# Patient Record
Sex: Female | Born: 1995
Health system: Southern US, Community
[De-identification: ages and names within clinical notes are randomized; demographics above are authoritative.]

## PROBLEM LIST (undated history)

## (undated) DIAGNOSIS — F419 Anxiety disorder, unspecified: Secondary | ICD-10-CM

## (undated) DIAGNOSIS — R87629 Unspecified abnormal cytological findings in specimens from vagina: Secondary | ICD-10-CM

## (undated) DIAGNOSIS — R519 Headache, unspecified: Secondary | ICD-10-CM

## (undated) DIAGNOSIS — R51 Headache: Secondary | ICD-10-CM

## (undated) DIAGNOSIS — E039 Hypothyroidism, unspecified: Secondary | ICD-10-CM

## (undated) DIAGNOSIS — N189 Chronic kidney disease, unspecified: Secondary | ICD-10-CM

## (undated) DIAGNOSIS — F32A Depression, unspecified: Secondary | ICD-10-CM

## (undated) DIAGNOSIS — R0600 Dyspnea, unspecified: Secondary | ICD-10-CM

## (undated) DIAGNOSIS — D649 Anemia, unspecified: Secondary | ICD-10-CM

## (undated) DIAGNOSIS — M549 Dorsalgia, unspecified: Secondary | ICD-10-CM

## (undated) DIAGNOSIS — R112 Nausea with vomiting, unspecified: Secondary | ICD-10-CM

## (undated) DIAGNOSIS — G43909 Migraine, unspecified, not intractable, without status migrainosus: Secondary | ICD-10-CM

## (undated) HISTORY — DX: Unspecified abnormal cytological findings in specimens from vagina: R87.629

## (undated) HISTORY — DX: Migraine, unspecified, not intractable, without status migrainosus: G43.909

## (undated) HISTORY — DX: Chronic kidney disease, unspecified: N18.9

## (undated) HISTORY — DX: Anemia, unspecified: D64.9

## (undated) HISTORY — PX: NO PAST SURGERIES: SHX2092

## (undated) HISTORY — PX: MOUTH SURGERY: SHX715

## (undated) HISTORY — DX: Dyspnea, unspecified: R06.00

## (undated) HISTORY — DX: Hypothyroidism, unspecified: E03.9

---
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
## 2014-11-16 ENCOUNTER — Emergency Department (HOSPITAL_BASED_OUTPATIENT_CLINIC_OR_DEPARTMENT_OTHER)
Admission: EM | Admit: 2014-11-16 | Discharge: 2014-11-16 | Disposition: A | Discharge status: Home / Self Care | Payer: Medicaid Other | Attending: Emergency Medicine | Admitting: Emergency Medicine

## 2014-11-16 ENCOUNTER — Encounter (HOSPITAL_BASED_OUTPATIENT_CLINIC_OR_DEPARTMENT_OTHER): Payer: Self-pay

## 2014-11-16 DIAGNOSIS — M25561 Pain in right knee: Secondary | ICD-10-CM | POA: Insufficient documentation

## 2014-11-16 DIAGNOSIS — J45909 Unspecified asthma, uncomplicated: Secondary | ICD-10-CM | POA: Diagnosis not present

## 2014-11-16 DIAGNOSIS — Z79899 Other long term (current) drug therapy: Secondary | ICD-10-CM | POA: Insufficient documentation

## 2014-11-16 DIAGNOSIS — M25562 Pain in left knee: Secondary | ICD-10-CM | POA: Diagnosis present

## 2014-11-16 MED ORDER — MELOXICAM 15 MG PO TABS: 15.0000 mg | ORAL_TABLET | Freq: Every day | ORAL | Status: DC

## 2014-11-16 NOTE — ED Notes (Signed)
Patient assessed by PA

## 2014-11-16 NOTE — ED Notes (Signed)
C/o bilat knee pain x 2 months after starting basketball 3 months ago

## 2014-11-16 NOTE — ED Provider Notes (Signed)
CSN: [id number]     Arrival date & time 11/16/14  1225 History   First MD Initiated Contact with Patient 11/16/14 1321     Chief Complaint  Patient presents with  . Knee Pain     (Consider location/radiation/quality/duration/timing/severity/associated sxs/prior Treatment) HPI Vicki Harper is a 19 y.o. female with history of asthma, presents to emergency department complaining of bilateral knee pain, left worse than right. Patient states that she finished playing basketball for her school 2 months ago, and states that is when her pain started. States it started in the left knee. Pain over bilateral joint. States knee feels stiff. Pain with movement and walking. States that pain moves from left leg to the right at times. States at this time pain is worse in the left leg. Denies weakness or numbness in her extremities. Denies any injuries. Denies any leg swelling or calf pain. Denies any fever or chills. Denies obvious swelling in the joint.  Past Medical History  Diagnosis Date  . Asthma   . Back pain    History reviewed. No pertinent past surgical history. No family history on file. History  Substance Use Topics  . Smoking status: Never Smoker   . Smokeless tobacco: Not on file  . Alcohol Use: No   OB History    No data available     Review of Systems  Constitutional: Negative for fever and chills.  Musculoskeletal: Positive for joint swelling and arthralgias.  Skin: Negative for color change and rash.  Neurological: Negative for weakness and numbness.      Allergies  Review of patient's allergies indicates no known allergies.  Home Medications   Prior to Admission medications   Medication Sig Start Date End Date Taking? Authorizing Provider  albuterol (PROVENTIL) (2.5 MG/3ML) 0.083% nebulizer solution Take 2.5 mg by nebulization every 6 (six) hours as needed for wheezing or shortness of breath.    Historical Provider, MD   BP 118/66 mmHg  Pulse 99  Temp(Src) 98.4 F  (36.9 C) (Oral)  Resp 18  Ht 5\' 6"  (1.676 m)  Wt 140 lb (63.504 kg)  BMI 22.61 kg/m2  SpO2 99% Physical Exam  Constitutional: She is oriented to person, place, and time. She appears well-developed and well-nourished. No distress.  Eyes: Conjunctivae are normal.  Neck: Neck supple.  Musculoskeletal:  Normal-appearing bilateral knees with no swelling, color change, not warm to touch. Right knee is nontender, full range of motion, negative anterior-posterior drawer signs. Left knee with tenderness to the medial lateral joint. Limited range of motion due to pain. Negative anterior-posterior drawer signs. No laxity of medial lateral stress. Dorsal pedal pulses intact and equal bilaterally. Normal ankle and hip joints.  Neurological: She is alert and oriented to person, place, and time.  Skin: Skin is warm and dry.  Nursing note and vitals reviewed.   ED Course  Procedures (including critical care time) Labs Review Labs Reviewed - No data to display  Imaging Review No results found.   EKG Interpretation None      MDM   Final diagnoses:  Arthralgia of both knees    Patient with bilateral knee pain and stiffness after finishing basketball season. She denies any injuries. Today her left knee has limited range of motion due to pain and stiffness. Otherwise unremarkable exam. There is no evidence of infectious joint, no obvious swelling, it is not warm to touch, she is afebrile. Will start on NSAIDs. Follow-up with orthopedics. Considered rheumatological cause, pt explained she will need  to follow up for further evaluation if pain continues. Pt voiced understanding. Knee sleeve given for the left knee pain and stiffness.   Filed Vitals:   11/16/14 1230  BP: 118/66  Pulse: 99  Temp: 98.4 F (36.9 C)  TempSrc: Oral  Resp: 18  Height: 5\' 6"  (1.676 m)  Weight: 140 lb (63.504 kg)  SpO2: 99%       Jeannett Senior, PA-C 11/16/14 2127  Malvin Johns, MD 11/18/14 [phone]

## 2014-11-16 NOTE — Discharge Instructions (Signed)
Take mobic for pain and inflammation daily. You can take tylenol in addition for pain. Keep knees elevated when at home. Ice several times a day. Start knee exercises. Follow up as referred for further evaluation.   Knee Pain The knee is the complex joint between your thigh and your lower leg. It is made up of bones, tendons, ligaments, and cartilage. The bones that make up the knee are:  The femur in the thigh.  The tibia and fibula in the lower leg.  The patella or kneecap riding in the groove on the lower femur. CAUSES  Knee pain is a common complaint with many causes. A few of these causes are:  Injury, such as:  A ruptured ligament or tendon injury.  Torn cartilage.  Medical conditions, such as:  Gout  Arthritis  Infections  Overuse, over training, or overdoing a physical activity. Knee pain can be minor or severe. Knee pain can accompany debilitating injury. Minor knee problems often respond well to self-care measures or get well on their own. More serious injuries may need medical intervention or even surgery. SYMPTOMS The knee is complex. Symptoms of knee problems can vary widely. Some of the problems are:  Pain with movement and weight bearing.  Swelling and tenderness.  Buckling of the knee.  Inability to straighten or extend your knee.  Your knee locks and you cannot straighten it.  Warmth and redness with pain and fever.  Deformity or dislocation of the kneecap. DIAGNOSIS  Determining what is wrong may be very straight forward such as when there is an injury. It can also be challenging because of the complexity of the knee. Tests to make a diagnosis may include:  Your caregiver taking a history and doing a physical exam.  Routine X-rays can be used to rule out other problems. X-rays will not reveal a cartilage tear. Some injuries of the knee can be diagnosed by:  Arthroscopy a surgical technique by which a small video camera is inserted through tiny  incisions on the sides of the knee. This procedure is used to examine and repair internal knee joint problems. Tiny instruments can be used during arthroscopy to repair the torn knee cartilage (meniscus).  Arthrography is a radiology technique. A contrast liquid is directly injected into the knee joint. Internal structures of the knee joint then become visible on X-ray film.  An MRI scan is a non X-ray radiology procedure in which magnetic fields and a computer produce two- or three-dimensional images of the inside of the knee. Cartilage tears are often visible using an MRI scanner. MRI scans have largely replaced arthrography in diagnosing cartilage tears of the knee.  Blood work.  Examination of the fluid that helps to lubricate the knee joint (synovial fluid). This is done by taking a sample out using a needle and a syringe. TREATMENT The treatment of knee problems depends on the cause. Some of these treatments are:  Depending on the injury, proper casting, splinting, surgery, or physical therapy care will be needed.  Give yourself adequate recovery time. Do not overuse your joints. If you begin to get sore during workout routines, back off. Slow down or do fewer repetitions.  For repetitive activities such as cycling or running, maintain your strength and nutrition.  Alternate muscle groups. For example, if you are a weight lifter, work the upper body on one day and the lower body the next.  Either tight or weak muscles do not give the proper support for your knee. Tight  or weak muscles do not absorb the stress placed on the knee joint. Keep the muscles surrounding the knee strong.  Take care of mechanical problems.  If you have flat feet, orthotics or special shoes may help. See your caregiver if you need help.  Arch supports, sometimes with wedges on the inner or outer aspect of the heel, can help. These can shift pressure away from the side of the knee most bothered by  osteoarthritis.  A brace called an "unloader" brace also may be used to help ease the pressure on the most arthritic side of the knee.  If your caregiver has prescribed crutches, braces, wraps or ice, use as directed. The acronym for this is PRICE. This means protection, rest, ice, compression, and elevation.  Nonsteroidal anti-inflammatory drugs (NSAIDs), can help relieve pain. But if taken immediately after an injury, they may actually increase swelling. Take NSAIDs with food in your stomach. Stop them if you develop stomach problems. Do not take these if you have a history of ulcers, stomach pain, or bleeding from the bowel. Do not take without your caregiver's approval if you have problems with fluid retention, heart failure, or kidney problems.  For ongoing knee problems, physical therapy may be helpful.  Glucosamine and chondroitin are over-the-counter dietary supplements. Both may help relieve the pain of osteoarthritis in the knee. These medicines are different from the usual anti-inflammatory drugs. Glucosamine may decrease the rate of cartilage destruction.  Injections of a corticosteroid drug into your knee joint may help reduce the symptoms of an arthritis flare-up. They may provide pain relief that lasts a few months. You may have to wait a few months between injections. The injections do have a small increased risk of infection, water retention, and elevated blood sugar levels.  Hyaluronic acid injected into damaged joints may ease pain and provide lubrication. These injections may work by reducing inflammation. A series of shots may give relief for as long as 6 months.  Topical painkillers. Applying certain ointments to your skin may help relieve the pain and stiffness of osteoarthritis. Ask your pharmacist for suggestions. Many over the-counter products are approved for temporary relief of arthritis pain.  In some countries, doctors often prescribe topical NSAIDs for relief of  chronic conditions such as arthritis and tendinitis. A review of treatment with NSAID creams found that they worked as well as oral medications but without the serious side effects. PREVENTION  Maintain a healthy weight. Extra pounds put more strain on your joints.  Get strong, stay limber. Weak muscles are a common cause of knee injuries. Stretching is important. Include flexibility exercises in your workouts.  Be smart about exercise. If you have osteoarthritis, chronic knee pain or recurring injuries, you may need to change the way you exercise. This does not mean you have to stop being active. If your knees ache after jogging or playing basketball, consider switching to swimming, water aerobics, or other low-impact activities, at least for a few days a week. Sometimes limiting high-impact activities will provide relief.  Make sure your shoes fit well. Choose footwear that is right for your sport.  Protect your knees. Use the proper gear for knee-sensitive activities. Use kneepads when playing volleyball or laying carpet. Buckle your seat belt every time you drive. Most shattered kneecaps occur in car accidents.  Rest when you are tired. SEEK MEDICAL CARE IF:  You have knee pain that is continual and does not seem to be getting better.  SEEK IMMEDIATE MEDICAL CARE IF:  Your knee joint feels hot to the touch and you have a high fever. MAKE SURE YOU:   Understand these instructions.  Will watch your condition.  Will get help right away if you are not doing well or get worse. Document Released: 03/22/2007 Document Revised: 08/17/2011 Document Reviewed: 03/22/2007 [facility] Patient Information 2015 [place], [place]. This information is not intended to replace advice given to you by your health care provider. Make sure you discuss any questions you have with your health care provider.   Knee Exercises EXERCISES RANGE OF MOTION (ROM) AND STRETCHING EXERCISES These exercises may help you  when beginning to rehabilitate your injury. Your symptoms may resolve with or without further involvement from your physician, physical therapist, or athletic trainer. While completing these exercises, remember:   Restoring tissue flexibility helps normal motion to return to the joints. This allows healthier, less painful movement and activity.  An effective stretch should be held for at least 30 seconds.  A stretch should never be painful. You should only feel a gentle lengthening or release in the stretched tissue. STRETCH - Knee Extension, Prone  Lie on your stomach on a firm surface, such as a bed or countertop. Place your right / left knee and leg just beyond the edge of the surface. You may wish to place a towel under the far end of your right / left thigh for comfort.  Relax your leg muscles and allow gravity to straighten your knee. Your clinician may advise you to add an ankle weight if more resistance is helpful for you.  You should feel a stretch in the back of your right / left knee. Hold this position for __________ seconds. Repeat __________ times. Complete this stretch __________ times per day. * Your physician, physical therapist, or athletic trainer may ask you to add ankle weight to enhance your stretch.  RANGE OF MOTION - Knee Flexion, Active  Lie on your back with both knees straight. (If this causes back discomfort, bend your opposite knee, placing your foot flat on the floor.)  Slowly slide your heel back toward your buttocks until you feel a gentle stretch in the front of your knee or thigh.  Hold for __________ seconds. Slowly slide your heel back to the starting position. Repeat __________ times. Complete this exercise __________ times per day.  STRETCH - Quadriceps, Prone   Lie on your stomach on a firm surface, such as a bed or padded floor.  Bend your right / left knee and grasp your ankle. If you are unable to reach your ankle or pant leg, use a belt around  your foot to lengthen your reach.  Gently pull your heel toward your buttocks. Your knee should not slide out to the side. You should feel a stretch in the front of your thigh and/or knee.  Hold this position for __________ seconds. Repeat __________ times. Complete this stretch __________ times per day.  STRETCH - Hamstrings, Supine   Lie on your back. Loop a belt or towel over the ball of your right / left foot.  Straighten your right / left knee and slowly pull on the belt to raise your leg. Do not allow the right / left knee to bend. Keep your opposite leg flat on the floor.  Raise the leg until you feel a gentle stretch behind your right / left knee or thigh. Hold this position for __________ seconds. Repeat __________ times. Complete this stretch __________ times per day.  STRENGTHENING EXERCISES These exercises may  help you when beginning to rehabilitate your injury. They may resolve your symptoms with or without further involvement from your physician, physical therapist, or athletic trainer. While completing these exercises, remember:   Muscles can gain both the endurance and the strength needed for everyday activities through controlled exercises.  Complete these exercises as instructed by your physician, physical therapist, or athletic trainer. Progress the resistance and repetitions only as guided.  You may experience muscle soreness or fatigue, but the pain or discomfort you are trying to eliminate should never worsen during these exercises. If this pain does worsen, stop and make certain you are following the directions exactly. If the pain is still present after adjustments, discontinue the exercise until you can discuss the trouble with your clinician. STRENGTH - Quadriceps, Isometrics  Lie on your back with your right / left leg extended and your opposite knee bent.  Gradually tense the muscles in the front of your right / left thigh. You should see either your knee cap  slide up toward your hip or increased dimpling just above the knee. This motion will push the back of the knee down toward the floor/mat/bed on which you are lying.  Hold the muscle as tight as you can without increasing your pain for __________ seconds.  Relax the muscles slowly and completely in between each repetition. Repeat __________ times. Complete this exercise __________ times per day.  STRENGTH - Quadriceps, Short Arcs   Lie on your back. Place a __________ inch towel roll under your knee so that the knee slightly bends.  Raise only your lower leg by tightening the muscles in the front of your thigh. Do not allow your thigh to rise.  Hold this position for __________ seconds. Repeat __________ times. Complete this exercise __________ times per day.  OPTIONAL ANKLE WEIGHTS: Begin with ____________________, but DO NOT exceed ____________________. Increase in 1 pound/0.5 kilogram increments.  STRENGTH - Quadriceps, Straight Leg Raises  Quality counts! Watch for signs that the quadriceps muscle is working to insure you are strengthening the correct muscles and not "cheating" by substituting with healthier muscles.  Lay on your back with your right / left leg extended and your opposite knee bent.  Tense the muscles in the front of your right / left thigh. You should see either your knee cap slide up or increased dimpling just above the knee. Your thigh may even quiver.  Tighten these muscles even more and raise your leg 4 to 6 inches off the floor. Hold for __________ seconds.  Keeping these muscles tense, lower your leg.  Relax the muscles slowly and completely in between each repetition. Repeat __________ times. Complete this exercise __________ times per day.  STRENGTH - Hamstring, Curls  Lay on your stomach with your legs extended. (If you lay on a bed, your feet may hang over the edge.)  Tighten the muscles in the back of your thigh to bend your right / left knee up to 90  degrees. Keep your hips flat on the bed/floor.  Hold this position for __________ seconds.  Slowly lower your leg back to the starting position. Repeat __________ times. Complete this exercise __________ times per day.  OPTIONAL ANKLE WEIGHTS: Begin with ____________________, but DO NOT exceed ____________________. Increase in 1 pound/0.5 kilogram increments.  STRENGTH - Quadriceps, Squats  Stand in a door frame so that your feet and knees are in line with the frame.  Use your hands for balance, not support, on the frame.  Slowly lower your  weight, bending at the hips and knees. Keep your lower legs upright so that they are parallel with the door frame. Squat only within the range that does not increase your knee pain. Never let your hips drop below your knees.  Slowly return upright, pushing with your legs, not pulling with your hands. Repeat __________ times. Complete this exercise __________ times per day.  STRENGTH - Quadriceps, Wall Slides  Follow guidelines for form closely. Increased knee pain often results from poorly placed feet or knees.  Lean against a smooth wall or door and walk your feet out 18-24 inches. Place your feet hip-width apart.  Slowly slide down the wall or door until your knees bend __________ degrees.* Keep your knees over your heels, not your toes, and in line with your hips, not falling to either side.  Hold for __________ seconds. Stand up to rest for __________ seconds in between each repetition. Repeat __________ times. Complete this exercise __________ times per day. * Your physician, physical therapist, or athletic trainer will alter this angle based on your symptoms and progress. Document Released: 04/08/2005 Document Revised: 10/09/2013 Document Reviewed: 09/06/2008 [facility] Patient Information 2015 [place], [place]. This information is not intended to replace advice given to you by your health care provider. Make sure you discuss any questions you have  with your health care provider.

## 2014-11-22 ENCOUNTER — Encounter: Payer: Self-pay | Admitting: Family Medicine

## 2014-11-22 ENCOUNTER — Ambulatory Visit (INDEPENDENT_AMBULATORY_CARE_PROVIDER_SITE_OTHER): Payer: Medicaid Other | Admitting: Family Medicine

## 2014-11-22 VITALS — BP 110/71 | HR 69 | Ht 66.0 in | Wt 140.0 lb

## 2014-11-22 DIAGNOSIS — M25562 Pain in left knee: Secondary | ICD-10-CM | POA: Diagnosis not present

## 2014-11-22 DIAGNOSIS — M25561 Pain in right knee: Secondary | ICD-10-CM

## 2014-11-22 NOTE — Patient Instructions (Addendum)
You have patellofemoral syndrome. Avoid painful activities (especially squats and lunges, plyometrics, increasing running mileage) when possible. Straight leg raise, straight leg rise with foot turned outwards, side hip raises, hamstring curls, hamstring swings, and decline half-squats - all 3 sets of 10 once a day. Can add ankle weight if needed to the above if they're too easy. Consider formal physical therapy - call me if you want to do this. Better arch support with something like dr. Zoe Lan active series when you're up and walking around. Avoid barefoot walking, flat shoes when possible. Icing 15 minutes at a time 3-4 times a day as needed Tylenol or aleve as needed for pain Follow up with me in 6 weeks.

## 2014-11-23 DIAGNOSIS — G8929 Other chronic pain: Secondary | ICD-10-CM | POA: Insufficient documentation

## 2014-11-23 DIAGNOSIS — M25561 Pain in right knee: Secondary | ICD-10-CM | POA: Insufficient documentation

## 2014-11-23 DIAGNOSIS — M25562 Pain in left knee: Principal | ICD-10-CM

## 2014-11-23 NOTE — Assessment & Plan Note (Signed)
2/2 patellofemoral syndrome.  Exam reassuring.  Home exercise, better arch support discussed and reviewed.  Icing, tylenol/nsaids if needed.  F/u in 6 weeks.  Consider custom orthotics, PT if not improving.

## 2014-11-23 NOTE — Progress Notes (Signed)
PCP: No primary care provider on file.  Subjective:   HPI: Patient is a 19 y.o. female here for bilateral knee pain.  Patient reports she's had bilateral knee pain for about 2 months. Pain is fairly diffuse in both - started posteriorly on left but now includes medial and lateral knee. Feels pain anteriorly on right and on medial and lateral aspects. Plays basketball but not playing any sports now. Possible swelling. Has tried bracing, icing, meloxicam, elevation. Sometimes knees feel unstable. No catching or locking. Pain left knee worse than right (7/10 vs 3/10).  Past Medical History  Diagnosis Date  . Asthma   . Back pain     Current Outpatient Prescriptions on File Prior to Visit  Medication Sig Dispense Refill  . albuterol (PROVENTIL) (2.5 MG/3ML) 0.083% nebulizer solution Take 2.5 mg by nebulization every 6 (six) hours as needed for wheezing or shortness of breath.    . meloxicam (MOBIC) 15 MG tablet Take 1 tablet (15 mg total) by mouth daily. 30 tablet 0   No current facility-administered medications on file prior to visit.    No past surgical history on file.  No Known Allergies  History   Social History  . Marital Status: Single    Spouse Name: N/A  . Number of Children: N/A  . Years of Education: N/A   Occupational History  . Not on file.   Social History Main Topics  . Smoking status: Never Smoker   . Smokeless tobacco: Not on file  . Alcohol Use: No  . Drug Use: No  . Sexual Activity: Yes    Birth Control/ Protection: Injection   Other Topics Concern  . Not on file   Social History Narrative    No family history on file.  BP 110/71 mmHg  Pulse 69  Ht 5\' 6"  (1.676 m)  Wt 140 lb (63.504 kg)  BMI 22.61 kg/m2  Review of Systems: See HPI above.    Objective:  Physical Exam:  Gen: NAD  Bilateral knees: No gross deformity, ecchymoses, swelling.  VMO atrophy. Diffuse mild tenderness. FROM. Negative ant/post drawers. Negative  valgus/varus testing. Negative lachmanns. Negative mcmurrays, apleys, patellar apprehension. Hip abduction 4/5 strength. NV intact distally. 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    Assessment & Plan:  1. Bilateral knee pain - 2/2 patellofemoral syndrome.  Exam reassuring.  Home exercise, better arch support discussed and reviewed.  Icing, tylenol/nsaids if needed.  F/u in 6 weeks.  Consider custom orthotics, PT if not improving.

## 2015-01-21 ENCOUNTER — Encounter (HOSPITAL_BASED_OUTPATIENT_CLINIC_OR_DEPARTMENT_OTHER): Payer: Self-pay | Admitting: *Deleted

## 2015-01-21 ENCOUNTER — Emergency Department (HOSPITAL_BASED_OUTPATIENT_CLINIC_OR_DEPARTMENT_OTHER)
Admission: EM | Admit: 2015-01-21 | Discharge: 2015-01-21 | Disposition: A | Discharge status: Home / Self Care | Payer: Medicaid Other | Attending: Emergency Medicine | Admitting: Emergency Medicine

## 2015-01-21 DIAGNOSIS — R519 Headache, unspecified: Secondary | ICD-10-CM

## 2015-01-21 DIAGNOSIS — Z791 Long term (current) use of non-steroidal anti-inflammatories (NSAID): Secondary | ICD-10-CM | POA: Diagnosis not present

## 2015-01-21 DIAGNOSIS — J45909 Unspecified asthma, uncomplicated: Secondary | ICD-10-CM | POA: Diagnosis not present

## 2015-01-21 DIAGNOSIS — Z79899 Other long term (current) drug therapy: Secondary | ICD-10-CM | POA: Insufficient documentation

## 2015-01-21 DIAGNOSIS — R51 Headache: Secondary | ICD-10-CM | POA: Insufficient documentation

## 2015-01-21 HISTORY — DX: Headache, unspecified: R51.9

## 2015-01-21 HISTORY — DX: Headache: R51

## 2015-01-21 MED ORDER — DIPHENHYDRAMINE HCL 25 MG PO CAPS
25.0000 mg | ORAL_CAPSULE | Freq: Once | ORAL | Status: AC
  Administered 2015-01-21: 25 mg via ORAL
  Filled 2015-01-21: qty 1

## 2015-01-21 MED ORDER — ALBUTEROL SULFATE HFA 108 (90 BASE) MCG/ACT IN AERS: 1.0000 | INHALATION_SPRAY | Freq: Four times a day (QID) | RESPIRATORY_TRACT | Status: DC | PRN

## 2015-01-21 MED ORDER — METOCLOPRAMIDE HCL 10 MG PO TABS
10.0000 mg | ORAL_TABLET | Freq: Once | ORAL | Status: AC
  Administered 2015-01-21: 10 mg via ORAL
  Filled 2015-01-21: qty 1

## 2015-01-21 NOTE — Discharge Instructions (Signed)

## 2015-01-21 NOTE — ED Notes (Signed)
Headache since yesterday

## 2015-01-21 NOTE — ED Provider Notes (Signed)
CSN: [id number]     Arrival date & time 01/21/15  1440 History   First MD Initiated Contact with Patient 01/21/15 1452     Chief Complaint  Patient presents with  . Headache     (Consider location/radiation/quality/duration/timing/severity/associated sxs/prior Treatment) Patient is a 19 y.o. female presenting with headaches.  Headache Pain location:  Generalized Quality:  Dull Radiates to:  Does not radiate Onset quality:  Gradual Timing:  Intermittent Progression:  Waxing and waning Chronicity:  Chronic (for years) Similar to prior headaches: yes   Context: bright light   Relieved by:  Nothing Worsened by:  Light Associated symptoms: no loss of balance, no nausea, no visual change and no vomiting     Past Medical History  Diagnosis Date  . Asthma   . Back pain   . Headache    History reviewed. No pertinent past surgical history. No family history on file. Social History  Substance Use Topics  . Smoking status: Never Smoker   . Smokeless tobacco: None  . Alcohol Use: No   OB History    No data available     Review of Systems  Gastrointestinal: Negative for nausea and vomiting.  Neurological: Positive for headaches. Negative for loss of balance.  All other systems reviewed and are negative.     Allergies  Review of patient's allergies indicates no known allergies.  Home Medications   Prior to Admission medications   Medication Sig Start Date End Date Taking? Authorizing Provider  albuterol (PROVENTIL HFA;VENTOLIN HFA) 108 (90 BASE) MCG/ACT inhaler Inhale 1-2 puffs into the lungs every 6 (six) hours as needed for wheezing or shortness of breath. 01/21/15   Debby Freiberg, MD  albuterol (PROVENTIL) (2.5 MG/3ML) 0.083% nebulizer solution Take 2.5 mg by nebulization every 6 (six) hours as needed for wheezing or shortness of breath.    Historical Provider, MD  meloxicam (MOBIC) 15 MG tablet Take 1 tablet (15 mg total) by mouth daily. 11/16/14   Tatyana Kirichenko,  PA-C   BP 114/64 mmHg  Pulse 86  Temp(Src) 98.3 F (36.8 C) (Oral)  Resp 20  Ht 5\' 5"  (1.651 m)  Wt 140 lb (63.504 kg)  BMI 23.30 kg/m2  SpO2 100% Physical Exam  Constitutional: She is oriented to person, place, and time. She appears well-developed and well-nourished.  HENT:  Head: Normocephalic and atraumatic.  Right Ear: External ear normal.  Left Ear: External ear normal.  Eyes: Conjunctivae and EOM are normal. Pupils are equal, round, and reactive to light.  Neck: Normal range of motion. Neck supple.  Cardiovascular: Normal rate, regular rhythm, normal heart sounds and intact distal pulses.   Pulmonary/Chest: Effort normal and breath sounds normal.  Abdominal: Soft. Bowel sounds are normal. There is no tenderness.  Musculoskeletal: Normal range of motion.  Neurological: She is alert and oriented to person, place, and time. She has normal strength and normal reflexes. No cranial nerve deficit or sensory deficit. Coordination and gait normal. GCS eye subscore is 4. GCS verbal subscore is 5. GCS motor subscore is 6.  Skin: Skin is warm and dry.  Vitals reviewed.   ED Course  Procedures (including critical care time) Labs Review Labs Reviewed - No data to display  Imaging Review No results found. IDebby Freiberg, personally reviewed and evaluated these images and lab results as part of my medical decision-making.   EKG Interpretation None      MDM   Final diagnoses:  Acute nonintractable headache, unspecified headache type  19 y.o. female with pertinent PMH of prior concussions, chronic migraine presents with ha as above.  Pt states ha is different, only in that it is not as bad as prior.  On arrival today vitals and physical exam as above, benign.  Detailed discussion re: return precautions.  Given reglan and benadryl.  DC home with neuro fu.    I have reviewed all laboratory and imaging studies if ordered as above  1. Acute nonintractable headache,  unspecified headache type         Debby Freiberg, MD 01/21/15 1510

## 2018-05-24 ENCOUNTER — Emergency Department (HOSPITAL_COMMUNITY)
Admission: EM | Admit: 2018-05-24 | Discharge: 2018-05-25 | Disposition: A | Discharge status: Home / Self Care | Payer: Self-pay | Attending: Emergency Medicine | Admitting: Emergency Medicine

## 2018-05-24 ENCOUNTER — Encounter (HOSPITAL_COMMUNITY): Payer: Self-pay

## 2018-05-24 ENCOUNTER — Other Ambulatory Visit: Payer: Self-pay

## 2018-05-24 DIAGNOSIS — J101 Influenza due to other identified influenza virus with other respiratory manifestations: Secondary | ICD-10-CM | POA: Insufficient documentation

## 2018-05-24 DIAGNOSIS — Z79899 Other long term (current) drug therapy: Secondary | ICD-10-CM | POA: Insufficient documentation

## 2018-05-24 DIAGNOSIS — J45909 Unspecified asthma, uncomplicated: Secondary | ICD-10-CM | POA: Insufficient documentation

## 2018-05-24 LAB — POC URINE PREG, ED: Preg Test, Ur: NEGATIVE

## 2018-05-24 NOTE — ED Provider Notes (Signed)
[facility] EMERGENCY DEPARTMENT Provider Note   CSN: [id number] Arrival date & time: 05/24/18  2149     History   Chief Complaint Chief Complaint  Patient presents with  . Fever  . Generalized Body Aches  . Weakness    HPI Lorane Cousar is a 22 y.o. female with history of asthma presents for evaluation of acute onset, progressively worsening fever, generalized body aches beginning when she awoke this morning.  She notes some nasal congestion, denies sore throat, cough, shortness of breath, urinary symptoms, nausea, or vomiting.  Does note mild generalized headaches.  Has had a temperature of 100.6 Fahrenheit and took Tylenol Cold and flu prior to arrival.  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  No recent travel outside of the country.  The history is provided by the patient.    Past Medical History:  Diagnosis Date  . Asthma   . Back pain   . Headache     Patient Active Problem List   Diagnosis Date Noted  . Bilateral knee pain 11/23/2014    History reviewed. No pertinent surgical history.   OB History   No obstetric history on file.      Home Medications    Prior to Admission medications   Medication Sig Start Date End Date Taking? Authorizing Provider  albuterol (PROVENTIL HFA;VENTOLIN HFA) 108 (90 BASE) MCG/ACT inhaler Inhale 1-2 puffs into the lungs every 6 (six) hours as needed for wheezing or shortness of breath. 01/21/15   Debby Freiberg, MD  albuterol (PROVENTIL) (2.5 MG/3ML) 0.083% nebulizer solution Take 2.5 mg by nebulization every 6 (six) hours as needed for wheezing or shortness of breath.    [provider]  meloxicam (MOBIC) 15 MG tablet Take 1 tablet (15 mg total) by mouth daily. 11/16/14   Kirichenko, Lahoma Rocker, PA-C  oseltamivir (TAMIFLU) 75 MG capsule Take 1 capsule (75 mg total) by mouth every 12 (twelve) hours for 5 days. 05/25/18 05/30/18   Renita Papa, PA-C    Family History History reviewed. No pertinent family history.  Social History Social History   Tobacco Use  . Smoking status: Never Smoker  . Smokeless tobacco: Never Used  Substance Use Topics  . Alcohol use: No    Alcohol/week: 0.0 standard drinks  . Drug use: No     Allergies   Patient has no known allergies.   Review of Systems Review of Systems  Constitutional: Positive for chills and fever.  HENT: Positive for congestion. Negative for sore throat.   Respiratory: Negative for cough and shortness of breath.   Cardiovascular: Negative for chest pain.  Gastrointestinal: Negative for nausea and vomiting.  Genitourinary: Negative for dysuria, hematuria and urgency.  Musculoskeletal: Positive for myalgias. Negative for neck stiffness.  Neurological: Positive for headaches.  All other systems reviewed and are negative.    Physical Exam Updated Vital Signs BP (!) 104/59 (BP Location: Right Arm)   Pulse 75   Temp 98.6 F (37 C) (Oral)   Resp 16   Ht 5\' 6"  (1.676 m)   Wt 68 kg   SpO2 97%   BMI 24.21 kg/m   Physical Exam Vitals signs and nursing note reviewed.  Constitutional:      General: She is not in acute distress.    Appearance: She is well-developed.  HENT:     Head: Normocephalic and atraumatic.     Right Ear: Tympanic  membrane and ear canal normal.     Left Ear: Tympanic membrane and ear canal normal.     Nose: Congestion present.     Right Sinus: No maxillary sinus tenderness or frontal sinus tenderness.     Left Sinus: No frontal sinus tenderness.     Mouth/Throat:     Mouth: Mucous membranes are moist.     Pharynx: Uvula midline. No oropharyngeal exudate, posterior oropharyngeal erythema or uvula swelling.     Tonsils: No tonsillar exudate or tonsillar abscesses. Swelling: 1+ on the right. 1+ on the left.     Comments: No trismus or sublingual abnormalities.  Tolerating secretions without difficulty.Normal  phonation. Eyes:     General:        Right eye: No discharge.        Left eye: No discharge.     Conjunctiva/sclera: Conjunctivae normal.  Neck:     Musculoskeletal: Normal range of motion and neck supple. No neck rigidity.     Vascular: No JVD.     Trachea: No tracheal deviation.  Cardiovascular:     Rate and Rhythm: Normal rate.     Heart sounds: Normal heart sounds.  Pulmonary:     Effort: Pulmonary effort is normal.  Chest:     Chest wall: No tenderness.  Abdominal:     General: Bowel sounds are normal. There is no distension.     Tenderness: There is no abdominal tenderness. There is no guarding.  Lymphadenopathy:     Cervical: No cervical adenopathy.  Skin:    General: Skin is warm and dry.     Findings: No erythema.  Neurological:     Mental Status: She is alert.  Psychiatric:        Behavior: Behavior normal.      ED Treatments / Results  Labs (all labs ordered are listed, but only abnormal results are displayed) Labs Reviewed  INFLUENZA PANEL BY PCR (TYPE A & B) - Abnormal; Notable for the following components:      Result Value   Influenza B By PCR POSITIVE (*)    All other components within normal limits  POC URINE PREG, ED    EKG None  Radiology No results found.  Procedures Procedures (including critical care time)  Medications Ordered in ED Medications - No data to display   Initial Impression / Assessment and Plan / ED Course  I have reviewed the triage vital signs and the nursing notes.  Pertinent labs & imaging results that were available during my care of the patient were reviewed by me and considered in my medical decision making (see chart for details).     Patient presenting for evaluation of fevers and generalized body aches with nasal congestion beginning this morning. She is afebrile, borderline hypotensive though upon chart review this appears to be her baseline.  She did take an antipyretic prior to arrival.  Lungs clear to  auscultation bilaterally and she is not complaining of chest pain, shortness of breath, or cough.  Do not see need for emergent chest x-ray at this time as I have a low suspicion of pneumonia. She tested positive for influenza B int he ED.  We discussed the risks and benefits of Tamiflu and she would like to proceed with treatment.  Encouraged fluid rehydration and supportive therapy.  She is tolerating p.o. fluids in the ED.  Discussed strict ED return precautions. Pt verbalized understanding of and agreement with plan and is safe for discharge home at  this time.  Final Clinical Impressions(s) / ED Diagnoses   Final diagnoses:  Influenza B    ED Discharge Orders         Ordered    oseltamivir (TAMIFLU) 75 MG capsule  Every 12 hours     05/25/18 0049           Renita Papa, PA-C 05/25/18 [id number]    Blanchie Dessert, MD 05/25/18 2007

## 2018-05-24 NOTE — ED Triage Notes (Signed)
Pt reports feeling normal until she woke up this morning with generalized body aches and weakness.  Tried to go to work but had to come home as her s/s worsened and became febrile (101 orally). Flu swab collected.

## 2018-05-25 LAB — INFLUENZA PANEL BY PCR (TYPE A & B)
Influenza A By PCR: NEGATIVE
Influenza B By PCR: POSITIVE — AB

## 2018-05-25 MED ORDER — OSELTAMIVIR PHOSPHATE 75 MG PO CAPS: 75.0000 mg | ORAL_CAPSULE | Freq: Two times a day (BID) | ORAL | 0 refills | Status: AC

## 2018-05-25 NOTE — Discharge Instructions (Addendum)
Start taking Tamiflu twice daily for the next 5 days.  This medication may cause nausea and vomiting and mood changes.  If the side effects are significant, stop taking the medication.  Drink plenty of fluids and get plenty of rest. Alternate 600 mg of ibuprofen and [id number] mg of Tylenol every 3 hours as needed for pain. Do not exceed 4000 mg of Tylenol daily.  Take ibuprofen with food to avoid upset stomach issues.  You can take any other over-the-counter cold and flu medicines additionally.  Follow-up with PCP if symptoms persist.  Return to the emergency department if any concerning signs or symptoms develop such as significant shortness of breath, high fevers not controlled by ibuprofen or Tylenol, persistent vomiting, or severe pain

## 2018-06-20 ENCOUNTER — Emergency Department (HOSPITAL_BASED_OUTPATIENT_CLINIC_OR_DEPARTMENT_OTHER)
Admission: EM | Admit: 2018-06-20 | Discharge: 2018-06-20 | Disposition: A | Discharge status: Home / Self Care | Payer: Self-pay | Attending: Emergency Medicine | Admitting: Emergency Medicine

## 2018-06-20 ENCOUNTER — Encounter (HOSPITAL_BASED_OUTPATIENT_CLINIC_OR_DEPARTMENT_OTHER): Payer: Self-pay | Admitting: Adult Health

## 2018-06-20 ENCOUNTER — Other Ambulatory Visit: Payer: Self-pay

## 2018-06-20 DIAGNOSIS — J029 Acute pharyngitis, unspecified: Secondary | ICD-10-CM | POA: Insufficient documentation

## 2018-06-20 DIAGNOSIS — J45909 Unspecified asthma, uncomplicated: Secondary | ICD-10-CM | POA: Insufficient documentation

## 2018-06-20 LAB — GROUP A STREP BY PCR: Group A Strep by PCR: NOT DETECTED

## 2018-06-20 NOTE — ED Provider Notes (Signed)
[facility] EMERGENCY DEPARTMENT Provider Note   CSN: [id number] Arrival date & time: 06/20/18  0751     History   Chief Complaint Chief Complaint  Patient presents with  . Sore Throat    HPI Vicki Harper is a 23 y.o. female here presenting with sore throat.  Patient states that she recently diagnosed with the flu.  Since this morning, she has some sore throat.  Denies any fevers or cough.  She states that her tonsils seemed a large.  States that she had previous strep infection.   The history is provided by the patient.    Past Medical History:  Diagnosis Date  . Asthma   . Back pain   . Headache     Patient Active Problem List   Diagnosis Date Noted  . Bilateral knee pain 11/23/2014    History reviewed. No pertinent surgical history.   OB History   No obstetric history on file.      Home Medications    Prior to Admission medications   Medication Sig Start Date End Date Taking? Authorizing Provider  albuterol (PROVENTIL HFA;VENTOLIN HFA) 108 (90 BASE) MCG/ACT inhaler Inhale 1-2 puffs into the lungs every 6 (six) hours as needed for wheezing or shortness of breath. 01/21/15  Yes Debby Freiberg, MD  albuterol (PROVENTIL) (2.5 MG/3ML) 0.083% nebulizer solution Take 2.5 mg by nebulization every 6 (six) hours as needed for wheezing or shortness of breath.   Yes [provider]  meloxicam (MOBIC) 15 MG tablet Take 1 tablet (15 mg total) by mouth daily. 11/16/14   Jeannett Senior, PA-C    Family History History reviewed. No pertinent family history.  Social History Social History   Tobacco Use  . Smoking status: Never Smoker  . Smokeless tobacco: Never Used  Substance Use Topics  . Alcohol use: No    Alcohol/week: 0.0 standard drinks  . Drug use: No     Allergies   Patient has no known allergies.   Review of Systems Review of Systems  HENT: Positive for sore throat.   All other systems reviewed and are  negative.    Physical Exam Updated Vital Signs BP 108/69 (BP Location: Left Arm)   Pulse 96   Temp 98.5 F (36.9 C)   Resp 14   Ht 5\' 6"  (1.676 m)   Wt 67.1 kg   LMP 05/23/2018 (Exact Date)   SpO2 100%   BMI 23.89 kg/m   Physical Exam Vitals signs and nursing note reviewed.  HENT:     Head: Normocephalic.     Right Ear: Tympanic membrane normal.     Left Ear: Tympanic membrane normal.     Mouth/Throat:     Comments: OP slightly red, tonsils enlarged, no obvious exudates  Eyes:     Conjunctiva/sclera: Conjunctivae normal.  Neck:     Musculoskeletal: Normal range of motion and neck supple.     Comments: No cervical LAD  Cardiovascular:     Heart sounds: Normal heart sounds.  Pulmonary:     Effort: Pulmonary effort is normal.  Abdominal:     Palpations: Abdomen is soft.  Skin:    General: Skin is warm.     Capillary Refill: Capillary refill takes less than 2 seconds.  Neurological:     General: No focal deficit present.     Mental Status: She is alert and oriented to person, place, and time.  Psychiatric:        Mood and Affect: Mood normal.  ED Treatments / Results  Labs (all labs ordered are listed, but only abnormal results are displayed) Labs Reviewed  GROUP A STREP BY PCR    EKG None  Radiology No results found.  Procedures Procedures (including critical care time)  Medications Ordered in ED Medications - No data to display   Initial Impression / Assessment and Plan / ED Course  I have reviewed the triage vital signs and the nursing notes.  Pertinent labs & imaging results that were available during my care of the patient were reviewed by me and considered in my medical decision making (see chart for details).    Vicki Harper is a 23 y.o. female here with sore throat after flu. Likely strep throat vs viral pharyngitis. Will swab for strep.   9:03 AM Strep negative. Likely viral. Gave strict return precautions    Final Clinical  Impressions(s) / ED Diagnoses   Final diagnoses:  None    ED Discharge Orders    None       Drenda Freeze, MD 06/20/18 [phone]

## 2018-06-20 NOTE — Discharge Instructions (Signed)
You likely have a viral throat infection.   Stay hydrated   Take tylenol, motrin for sore throat.   See your doctor  Return to ER if you have fever, trouble swallowing, worse sore throat

## 2018-06-20 NOTE — ED Triage Notes (Signed)
Presentswith sore throat that began today, states it feels the same as when she had strep throat. Throat is red, no exudate. Swollen, enlarged tonsils.

## 2019-06-03 ENCOUNTER — Emergency Department (HOSPITAL_BASED_OUTPATIENT_CLINIC_OR_DEPARTMENT_OTHER)
Admission: EM | Admit: 2019-06-03 | Discharge: 2019-06-03 | Disposition: A | Discharge status: Home / Self Care | Payer: Medicaid Other | Attending: Emergency Medicine | Admitting: Emergency Medicine

## 2019-06-03 ENCOUNTER — Other Ambulatory Visit: Payer: Self-pay

## 2019-06-03 ENCOUNTER — Encounter (HOSPITAL_BASED_OUTPATIENT_CLINIC_OR_DEPARTMENT_OTHER): Payer: Self-pay | Admitting: Emergency Medicine

## 2019-06-03 DIAGNOSIS — O99519 Diseases of the respiratory system complicating pregnancy, unspecified trimester: Secondary | ICD-10-CM | POA: Diagnosis not present

## 2019-06-03 DIAGNOSIS — J45909 Unspecified asthma, uncomplicated: Secondary | ICD-10-CM | POA: Insufficient documentation

## 2019-06-03 DIAGNOSIS — N760 Acute vaginitis: Secondary | ICD-10-CM

## 2019-06-03 DIAGNOSIS — O99891 Other specified diseases and conditions complicating pregnancy: Secondary | ICD-10-CM

## 2019-06-03 DIAGNOSIS — B9689 Other specified bacterial agents as the cause of diseases classified elsewhere: Secondary | ICD-10-CM

## 2019-06-03 DIAGNOSIS — O23599 Infection of other part of genital tract in pregnancy, unspecified trimester: Secondary | ICD-10-CM | POA: Insufficient documentation

## 2019-06-03 DIAGNOSIS — O2340 Unspecified infection of urinary tract in pregnancy, unspecified trimester: Secondary | ICD-10-CM | POA: Insufficient documentation

## 2019-06-03 DIAGNOSIS — R8271 Bacteriuria: Secondary | ICD-10-CM

## 2019-06-03 DIAGNOSIS — O4690 Antepartum hemorrhage, unspecified, unspecified trimester: Secondary | ICD-10-CM

## 2019-06-03 DIAGNOSIS — N939 Abnormal uterine and vaginal bleeding, unspecified: Secondary | ICD-10-CM

## 2019-06-03 DIAGNOSIS — Z79899 Other long term (current) drug therapy: Secondary | ICD-10-CM | POA: Insufficient documentation

## 2019-06-03 DIAGNOSIS — O209 Hemorrhage in early pregnancy, unspecified: Secondary | ICD-10-CM | POA: Insufficient documentation

## 2019-06-03 LAB — CBC WITH DIFFERENTIAL/PLATELET
Abs Immature Granulocytes: 0.02 10*3/uL (ref 0.00–0.07)
Basophils Absolute: 0 10*3/uL (ref 0.0–0.1)
Basophils Relative: 0 %
Eosinophils Absolute: 0 10*3/uL (ref 0.0–0.5)
Eosinophils Relative: 0 %
HCT: 38.9 % (ref 36.0–46.0)
Hemoglobin: 12.3 g/dL (ref 12.0–15.0)
Immature Granulocytes: 0 %
Lymphocytes Relative: 14 %
Lymphs Abs: 1 10*3/uL (ref 0.7–4.0)
MCH: 30.8 pg (ref 26.0–34.0)
MCHC: 31.6 g/dL (ref 30.0–36.0)
MCV: 97.5 fL (ref 80.0–100.0)
Monocytes Absolute: 0.3 10*3/uL (ref 0.1–1.0)
Monocytes Relative: 5 %
Neutro Abs: 6 10*3/uL (ref 1.7–7.7)
Neutrophils Relative %: 81 %
Platelets: 259 10*3/uL (ref 150–400)
RBC: 3.99 MIL/uL (ref 3.87–5.11)
RDW: 12.6 % (ref 11.5–15.5)
WBC: 7.4 10*3/uL (ref 4.0–10.5)
nRBC: 0 % (ref 0.0–0.2)

## 2019-06-03 LAB — COMPREHENSIVE METABOLIC PANEL
ALT: 8 U/L (ref 0–44)
AST: 14 U/L — ABNORMAL LOW (ref 15–41)
Albumin: 4.1 g/dL (ref 3.5–5.0)
Alkaline Phosphatase: 36 U/L — ABNORMAL LOW (ref 38–126)
Anion gap: 8 (ref 5–15)
BUN: 14 mg/dL (ref 6–20)
CO2: 22 mmol/L (ref 22–32)
Calcium: 8.9 mg/dL (ref 8.9–10.3)
Chloride: 108 mmol/L (ref 98–111)
Creatinine, Ser: 0.84 mg/dL (ref 0.44–1.00)
GFR calc Af Amer: >60 mL/min (ref >60)
GFR calc non Af Amer: >60 mL/min (ref >60)
Glucose, Bld: 88 mg/dL (ref 70–99)
Potassium: 3.9 mmol/L (ref 3.5–5.1)
Sodium: 138 mmol/L (ref 135–145)
Total Bilirubin: 1 mg/dL (ref 0.3–1.2)
Total Protein: 7.2 g/dL (ref 6.5–8.1)

## 2019-06-03 LAB — URINALYSIS, ROUTINE W REFLEX MICROSCOPIC
Bilirubin Urine: NEGATIVE
Glucose, UA: NEGATIVE mg/dL
Ketones, ur: NEGATIVE mg/dL
Leukocytes,Ua: NEGATIVE
Nitrite: NEGATIVE
Protein, ur: 30 mg/dL — AB
Specific Gravity, Urine: >1.03 — ABNORMAL HIGH (ref 1.005–1.030)
pH: 6 (ref 5.0–8.0)

## 2019-06-03 LAB — WET PREP, GENITAL
Sperm: NONE SEEN
Trich, Wet Prep: NONE SEEN
Yeast Wet Prep HPF POC: NONE SEEN

## 2019-06-03 LAB — URINALYSIS, MICROSCOPIC (REFLEX): RBC / HPF: >50 RBC/hpf (ref 0–5)

## 2019-06-03 LAB — HCG, QUANTITATIVE, PREGNANCY: hCG, Beta Chain, Quant, S: 9 m[IU]/mL — ABNORMAL HIGH (ref <5)

## 2019-06-03 LAB — PREGNANCY, URINE: Preg Test, Ur: NEGATIVE

## 2019-06-03 LAB — LIPASE, BLOOD: Lipase: 31 U/L (ref 11–51)

## 2019-06-03 LAB — HIV ANTIBODY (ROUTINE TESTING W REFLEX): HIV Screen 4th Generation wRfx: NONREACTIVE

## 2019-06-03 MED ORDER — AZITHROMYCIN 250 MG PO TABS
1000.0000 mg | ORAL_TABLET | Freq: Once | ORAL | Status: AC
  Administered 2019-06-03: 1000 mg via ORAL
  Filled 2019-06-03: qty 4

## 2019-06-03 MED ORDER — METRONIDAZOLE 500 MG PO TABS: 500.0000 mg | ORAL_TABLET | Freq: Four times a day (QID) | ORAL | 0 refills | Status: AC

## 2019-06-03 MED ORDER — METRONIDAZOLE 500 MG PO TABS: 500.0000 mg | ORAL_TABLET | Freq: Four times a day (QID) | ORAL | 0 refills | Status: DC

## 2019-06-03 MED ORDER — CEPHALEXIN 250 MG PO CAPS: 250.0000 mg | ORAL_CAPSULE | Freq: Four times a day (QID) | ORAL | 0 refills | Status: DC

## 2019-06-03 NOTE — ED Notes (Signed)
Pt verbalizes understanding of d/c instructions.

## 2019-06-03 NOTE — ED Provider Notes (Signed)
[place] EMERGENCY DEPARTMENT Provider Note   CSN: [id number] Arrival date & time: 06/03/19  [id number]     History Chief Complaint  Patient presents with  . Vaginal Bleeding    Zamyriah Polley is a 23 y.o. female.  HPI  Patient is 23 year old female presented today with suprapubic abdominal cramping that began this morning.  Patient states it feels similar in character and severity to her normal menses.  She describes this as mild, crampy, 2/10, constant pain with mild improvement with 70 mg of Tylenol which took this morning.  Patient denies any radiation of pain denies any lightheadedness or dizziness.  Denies any nausea vomiting.  Does endorse some mild diarrhea this morning.  States that she was due for her last menstrual period approximately 12/18 and when she did not have it.  She had a urine pregnancy test that she states was positive yesterday.  Patient states that in addition to cramping abdominal pain this morning she has noticed some vaginal bleeding with small clots.  Patient states she is on no birth control currently has used Depo shot and OCPs in the past.  No history of ectopics.  Does not have IUD in place.  No history of miscarriages.     Past Medical History:  Diagnosis Date  . Asthma   . Back pain   . Headache     Patient Active Problem List   Diagnosis Date Noted  . Bilateral knee pain 11/23/2014    History reviewed. No pertinent surgical history.   OB History   No obstetric history on file.     No family history on file.  Social History   Tobacco Use  . Smoking status: Never Smoker  . Smokeless tobacco: Never Used  Substance Use Topics  . Alcohol use: No    Alcohol/week: 0.0 standard drinks  . Drug use: No    Home Medications Prior to Admission medications   Medication Sig Start Date End Date Taking? Authorizing Provider  albuterol (PROVENTIL HFA;VENTOLIN HFA) 108 (90 BASE) MCG/ACT inhaler Inhale 1-2 puffs into the lungs every 6  (six) hours as needed for wheezing or shortness of breath. 01/21/15   Debby Freiberg, MD  albuterol (PROVENTIL) (2.5 MG/3ML) 0.083% nebulizer solution Take 2.5 mg by nebulization every 6 (six) hours as needed for wheezing or shortness of breath.    [provider]  cephALEXin (KEFLEX) 250 MG capsule Take 1 capsule (250 mg total) by mouth 4 (four) times daily. 06/03/19   Tedd Sias, PA  meloxicam (MOBIC) 15 MG tablet Take 1 tablet (15 mg total) by mouth daily. 11/16/14   Kirichenko, Tatyana, PA-C  metroNIDAZOLE (FLAGYL) 500 MG tablet Take 1 tablet (500 mg total) by mouth 4 (four) times daily for 5 days. 06/03/19 06/08/19  Tedd Sias, PA    Allergies    Patient has no known allergies.  Review of Systems   Review of Systems  Constitutional: Negative for chills and fever.  HENT: Negative for congestion.   Eyes: Negative for pain.  Respiratory: Negative for cough and shortness of breath.   Cardiovascular: Negative for chest pain and leg swelling.  Gastrointestinal: Positive for abdominal pain and diarrhea. Negative for blood in stool, constipation, nausea, rectal pain and vomiting.  Genitourinary: Positive for menstrual problem and vaginal bleeding. Negative for difficulty urinating, dysuria, flank pain, frequency, hematuria, urgency, vaginal discharge and vaginal pain.  Musculoskeletal: Negative for myalgias.  Skin: Negative for rash.  Neurological: Negative for dizziness and headaches.  Physical Exam Updated Vital Signs BP 122/82 (BP Location: Right Arm)   Pulse 60   Temp 99 F (37.2 C) (Oral)   Resp 18   Ht 5\' 6"  (1.676 m)   Wt 68 kg   LMP 04/28/2019   SpO2 100%   BMI 24.21 kg/m   Physical Exam Vitals and nursing note reviewed.  Constitutional:      General: She is not in acute distress.    Comments: Pleasant well-appearing 23 year old female no acute distress.  In no obvious pain.  Able answer questions appropriately and follow commands.  HENT:      Head: Normocephalic and atraumatic.     Nose: Nose normal.  Eyes:     General: No scleral icterus. Cardiovascular:     Rate and Rhythm: Normal rate and regular rhythm.     Pulses: Normal pulses.     Heart sounds: Normal heart sounds.  Pulmonary:     Effort: Pulmonary effort is normal. No respiratory distress.     Breath sounds: No wheezing.  Abdominal:     General: There is no distension.     Palpations: Abdomen is soft.     Tenderness: There is no abdominal tenderness. There is no right CVA tenderness, left CVA tenderness, guarding or rebound.     Comments: No tenderness with light or deep palpation no guarding or rebound  Genitourinary:    Comments: Vulva without lesions or abnormality Vaginal canal without abnormal discharge or lesion--blood pooled in the vaginal vault Cervix appears normal, is closed with mild bloody discharge exsanguinating from No adnexal tenderness or CMT  Nonthrombosed external hemorrhoids noted to the external anus. Musculoskeletal:     Cervical back: Normal range of motion.     Right lower leg: No edema.     Left lower leg: No edema.  Skin:    General: Skin is warm and dry.     Capillary Refill: Capillary refill takes less than 2 seconds.  Neurological:     Mental Status: She is alert. Mental status is at baseline.  Psychiatric:        Mood and Affect: Mood normal.        Behavior: Behavior normal.     ED Results / Procedures / Treatments   Labs (all labs ordered are listed, but only abnormal results are displayed) Labs Reviewed  WET PREP, GENITAL - Abnormal; Notable for the following components:      Result Value   Clue Cells Wet Prep HPF POC PRESENT (*)    WBC, Wet Prep HPF POC MANY (*)    All other components within normal limits  COMPREHENSIVE METABOLIC PANEL - Abnormal; Notable for the following components:   AST 14 (*)    Alkaline Phosphatase 36 (*)    All other components within normal limits  URINALYSIS, ROUTINE W REFLEX  MICROSCOPIC - Abnormal; Notable for the following components:   APPearance CLOUDY (*)    Specific Gravity, Urine >1.030 (*)    Hgb urine dipstick LARGE (*)    Protein, ur 30 (*)    All other components within normal limits  HCG, QUANTITATIVE, PREGNANCY - Abnormal; Notable for the following components:   hCG, Beta Chain, Quant, S 9 (*)    All other components within normal limits  URINALYSIS, MICROSCOPIC (REFLEX) - Abnormal; Notable for the following components:   Bacteria, UA MANY (*)    All other components within normal limits  URINE CULTURE  PREGNANCY, URINE  CBC WITH DIFFERENTIAL/PLATELET  LIPASE, BLOOD  HIV ANTIBODY (ROUTINE TESTING W REFLEX)  RPR  GC/CHLAMYDIA PROBE AMP ([place]) NOT AT [facility]    EKG None  Radiology No results found.  Procedures Procedures (including critical care time)  Medications Ordered in ED Medications  azithromycin (ZITHROMAX) tablet 1,000 mg (1,000 mg Oral Given 06/03/19 1225)    ED Course  I have reviewed the triage vital signs and the nursing notes.  Pertinent labs & imaging results that were available during my care of the patient were reviewed by me and considered in my medical decision making (see chart for details).  Clinical Course as of Jun 02 1846  Sat Jun 03, 2019  1126 Lab called to inform that pregnancy test was negative when timer expired however faintly positive before being discarded.  Recommended follow-up testing.  Pregnancy, urine [WF]  1845 GC, HIV RPR pending.  GC/Chlamydia probe amp [WF]  1846 CBC within normal limits  CBC with Differential [WF]  1846 cmp within normal limits  Comprehensive metabolic panel(!) [WF]  99991111 Blood prep with evidence of BV.  No evidence of trichomonas or yeast  Wet prep, genital(!) [WF]  1847 Urinalysis with many bacteria negative for nitrates or leukocytes.  Will obtain urine culture.    Urinalysis, Routine w reflex microscopic(!) [WF]    Clinical Course User Index [WF] Tedd Sias, [place]   MDM Rules/Calculators/A&P                      Patient is 23 year old female presented today after positive pregnancy test yesterday with chief complaint of vaginal bleeding with small clots and some cramping that feels consistent with prior menstruation cramps that she had in the past.  Physical exam is significant for moderate amount of blood in vaginal canal.  No CMT or abnormal discharge to indicate infection.  Adnexal tenderness to indicate ectopic pregnancy.  No abdominal Tpp.  No nausea or vomiting.  History is consistent with early miscarriage.  Doubt ectopic pregnancy as patient has normal vitals, mild 2/10 pain and no tenderness to palpation.   Please see clinical course for interpretation of labs.  Patient is a Marine scientist with bacterial vaginosis.  Has some bacteria present in her noncontaminated urine sample did have some dysuria she states it feels more like irritation when she pees.  States that she does not feel she has a UTI today.  I suspect that this is an incidental finding.  Will culture urine however discussed with patient that she is pregnant have a lower threshold for treating.  Present status we will treat with Keflex for 5 days.  We will treat BV with Flagyl for 7 days.  I the process of ordering STI labs patient was inappropriately provided with azithromycin due to a ordering error on my part.  Discussed with the patient she has no allergies and I suspect this will cause any issues with patient.  FDA has no known fetal risk azithromycin.  As patient is pregnant follow-up with women's clinic.  Suspect patient has threatened abortion.  Discussed with patient that she requires following by OB/GYN to repeat hCG within 1 week.   Vitals are within normal limits.  Patient is not describing any pain at time of reassessment.  He has no abdominal tenderness and continues to be in good spirits.  Discussed with patient my concern that she could be having ectopic pregnancy  discussed strict return precautions.  She is understanding of this and will return to ED immediately if she has  any lightheadedness, dizziness, sudden onset abdominal pain or any new or concerning symptoms.     This patient appears reasonably screened and I doubt any other medical condition requiring further workup, evaluation, or treatment in the ED at this time prior to discharge.   Patient's vitals are WNL apart from vital sign abnormalities discussed above, patient is in NAD, and able to ambulate in the ED at their baseline. Pain has been managed or a plan has been made for home management and has no complaints prior to discharge. Patient is comfortable with above plan and is stable for discharge at this time. All questions were answered prior to disposition. Results from the ER workup discussed with the patient face to face and all questions answered to the best of my ability. The patient is safe for discharge with strict return precautions. Patient appears safe for discharge with appropriate follow-up. Conveyed my impression with the patient and they voiced understanding and are agreeable to plan.   An After Visit Summary was printed and given to the patient.  Portions of this note were generated with [employer]. Dictation errors may occur despite best attempts at proofreading.   I discussed this case with my attending physician who cosigned this note including patient's presenting symptoms, physical exam, and planned diagnostics and interventions. Attending physician stated agreement with plan or made changes to plan which were implemented.   Jakyiah Steffenhagen was evaluated in Emergency Department on 06/03/2019 for the symptoms described in the history of present illness. She was evaluated in the context of the global COVID-19 pandemic, which necessitated consideration that the patient might be at risk for infection with the SARS-CoV-2 virus that causes COVID-19. Institutional  protocols and algorithms that pertain to the evaluation of patients at risk for COVID-19 are in a state of rapid change based on information released by regulatory bodies including the CDC and federal and state organizations. These policies and algorithms were followed during the patient's care in the ED.   Final Clinical Impression(s) / ED Diagnoses Final diagnoses:  Vaginal bleeding  Bacterial vaginosis  Vaginal bleeding in pregnancy  Bacteriuria during pregnancy    Rx / DC Orders ED Discharge Orders         Ordered    metroNIDAZOLE (FLAGYL) 500 MG tablet  4 times daily,   Status:  Discontinued     06/03/19 1302    cephALEXin (KEFLEX) 250 MG capsule  4 times daily,   Status:  Discontinued     06/03/19 1302    cephALEXin (KEFLEX) 250 MG capsule  4 times daily     06/03/19 1326    metroNIDAZOLE (FLAGYL) 500 MG tablet  4 times daily     06/03/19 1326           Pati Gallo [place], [place] 06/03/19 1909    Hayden Rasmussen, MD 06/03/19 1910

## 2019-06-03 NOTE — Discharge Instructions (Addendum)
Diagnosed with bacterial vaginosis today.  Your pregnancy test is consistent with early pregnancy.  However you may be experiencing a early miscarriage.  Please follow-up with OB/GYN or the women's clinic I have attached information from his clinic.  They will trend your hCG levels and manage her care.  Please call Monday to make an appoint with them.  There is evidence of a urinary tract infection as well.  You will be taking antibiotics for 5 days for this for bacterial vaginosis you would antibiotics for 7 days

## 2019-06-03 NOTE — ED Triage Notes (Signed)
Vaginal bleeding with cramping today, states it is heavy like her menstrual cycle. She had a positive preg test last week.

## 2019-06-04 LAB — URINE CULTURE: Culture: NO GROWTH

## 2019-06-04 LAB — RPR: RPR Ser Ql: NONREACTIVE

## 2019-06-06 LAB — GC/CHLAMYDIA PROBE AMP (~~LOC~~) NOT AT ARMC
Chlamydia: NEGATIVE
Neisseria Gonorrhea: NEGATIVE

## 2019-06-09 NOTE — L&D Delivery Note (Signed)
Vicki Harper is a 24 y.o.@ s/p vaginal delivery at [redacted]w[redacted]d.  She was admitted in early labor on 11/5.    ROM: 20h 70m with clear/light muconium stained fluid GBS Status: negative Maximum Maternal Temperature: 99.28F   Labor Progress: Pt in latent labor on admission.  She continued to progress slowly without augmentation. Given limited cervical change, AROM performed for light meconium stained fluid. Pitocin was also initiated and pt progressed to complete cervical dilation. Prior to patient starting to push, she had prolonged deceleration to 50's, received terbutaline and pitocin stopped. FHR recovered and she began pushing. She then delivered without complication as noted below.   Delivery Date/Time:  Delivery: Called to room and patient was complete and pushing. Head delivered ROA. No nuchal cord present. Shoulder and body delivered in usual fashion. Infant with spontaneous cry, placed on mother's abdomen, dried and stimulated. Cord clamped x 2 after 1-minute delay, and cut by FOB under my direct supervision. Cord blood drawn. Placenta delivered spontaneously with gentle cord traction. Fundus firm with massage and Pitocin. Labia, perineum, vagina, and cervix were inspected; no lacerations visualized.    Placenta: intact, 3-vessel cord, sent to L&D Complications: none Lacerations: right vaginal laceration, repaired  EBL: 150 ml Analgesia: epidural   Infant: female  APGARs and weight per medical record   Sharene Skeans, MD [facility] Family Medicine Fellow, [facility] for [facility], [place]

## 2019-08-01 ENCOUNTER — Encounter (HOSPITAL_BASED_OUTPATIENT_CLINIC_OR_DEPARTMENT_OTHER): Payer: Self-pay | Admitting: *Deleted

## 2019-08-01 ENCOUNTER — Other Ambulatory Visit: Payer: Self-pay

## 2019-08-01 ENCOUNTER — Emergency Department (HOSPITAL_BASED_OUTPATIENT_CLINIC_OR_DEPARTMENT_OTHER)
Admission: EM | Admit: 2019-08-01 | Discharge: 2019-08-01 | Disposition: A | Discharge status: Home / Self Care | Payer: Medicaid Other | Attending: Emergency Medicine | Admitting: Emergency Medicine

## 2019-08-01 DIAGNOSIS — J45909 Unspecified asthma, uncomplicated: Secondary | ICD-10-CM | POA: Insufficient documentation

## 2019-08-01 DIAGNOSIS — R112 Nausea with vomiting, unspecified: Secondary | ICD-10-CM | POA: Diagnosis not present

## 2019-08-01 DIAGNOSIS — O219 Vomiting of pregnancy, unspecified: Secondary | ICD-10-CM

## 2019-08-01 LAB — CBC WITH DIFFERENTIAL/PLATELET
Abs Immature Granulocytes: 0.03 10*3/uL (ref 0.00–0.07)
Basophils Absolute: 0 10*3/uL (ref 0.0–0.1)
Basophils Relative: 0 %
Eosinophils Absolute: 0 10*3/uL (ref 0.0–0.5)
Eosinophils Relative: 0 %
HCT: 42.5 % (ref 36.0–46.0)
Hemoglobin: 13.7 g/dL (ref 12.0–15.0)
Immature Granulocytes: 0 %
Lymphocytes Relative: 4 %
Lymphs Abs: 0.3 10*3/uL — ABNORMAL LOW (ref 0.7–4.0)
MCH: 30.9 pg (ref 26.0–34.0)
MCHC: 32.2 g/dL (ref 30.0–36.0)
MCV: 95.7 fL (ref 80.0–100.0)
Monocytes Absolute: 0.3 10*3/uL (ref 0.1–1.0)
Monocytes Relative: 4 %
Neutro Abs: 7.5 10*3/uL (ref 1.7–7.7)
Neutrophils Relative %: 92 %
Platelets: 219 10*3/uL (ref 150–400)
RBC: 4.44 MIL/uL (ref 3.87–5.11)
RDW: 12.3 % (ref 11.5–15.5)
WBC: 8.1 10*3/uL (ref 4.0–10.5)
nRBC: 0 % (ref 0.0–0.2)

## 2019-08-01 LAB — COMPREHENSIVE METABOLIC PANEL
ALT: 9 U/L (ref 0–44)
AST: 17 U/L (ref 15–41)
Albumin: 4.2 g/dL (ref 3.5–5.0)
Alkaline Phosphatase: 36 U/L — ABNORMAL LOW (ref 38–126)
Anion gap: 7 (ref 5–15)
BUN: 17 mg/dL (ref 6–20)
CO2: 23 mmol/L (ref 22–32)
Calcium: 8.9 mg/dL (ref 8.9–10.3)
Chloride: 105 mmol/L (ref 98–111)
Creatinine, Ser: 0.87 mg/dL (ref 0.44–1.00)
GFR calc Af Amer: >60 mL/min (ref >60)
GFR calc non Af Amer: >60 mL/min (ref >60)
Glucose, Bld: 93 mg/dL (ref 70–99)
Potassium: 3.8 mmol/L (ref 3.5–5.1)
Sodium: 135 mmol/L (ref 135–145)
Total Bilirubin: 1 mg/dL (ref 0.3–1.2)
Total Protein: 7.6 g/dL (ref 6.5–8.1)

## 2019-08-01 LAB — LIPASE, BLOOD: Lipase: 33 U/L (ref 11–51)

## 2019-08-01 LAB — URINALYSIS, ROUTINE W REFLEX MICROSCOPIC
Bilirubin Urine: NEGATIVE
Glucose, UA: NEGATIVE mg/dL
Hgb urine dipstick: NEGATIVE
Ketones, ur: NEGATIVE mg/dL
Leukocytes,Ua: NEGATIVE
Nitrite: NEGATIVE
Protein, ur: NEGATIVE mg/dL
Specific Gravity, Urine: 1.025 (ref 1.005–1.030)
pH: 7.5 (ref 5.0–8.0)

## 2019-08-01 LAB — PREGNANCY, URINE: Preg Test, Ur: POSITIVE — AB

## 2019-08-01 MED ORDER — DOXYLAMINE-PYRIDOXINE 10-10 MG PO TBEC: DELAYED_RELEASE_TABLET | ORAL | 0 refills | Status: DC

## 2019-08-01 MED ORDER — ONDANSETRON HCL 4 MG/2ML IJ SOLN
4.0000 mg | Freq: Once | INTRAMUSCULAR | Status: AC
  Administered 2019-08-01: 13:00:00 4 mg via INTRAVENOUS
  Filled 2019-08-01: qty 2

## 2019-08-01 MED ORDER — SODIUM CHLORIDE 0.9 % IV BOLUS
1000.0000 mL | Freq: Once | INTRAVENOUS | Status: AC
  Administered 2019-08-01: 1000 mL via INTRAVENOUS

## 2019-08-01 NOTE — ED Triage Notes (Addendum)
Fever and vomiting this am. She feels weak. She thinks she may be pregnant.

## 2019-08-01 NOTE — Discharge Instructions (Addendum)
You were seen in the ER today for nausea and vomiting.  Your pregnancy test came back positive. Your labs were overall reassuring.   Please start prenatal vitamins.  Do not drink alcohol or utilize drugs.  Discuss any medicines you take with the pharmacist regarding their safety in pregnancy.   Please follow up with obgyn within 3 days to establish care  In the meant time we are sending you home with a prescription for diclegis to take for nausea/vomiting.   We have prescribed you new medication(s) today. Discuss the medications prescribed today with your pharmacist as they can have adverse effects and interactions with your other medicines including over the counter and prescribed medications. Seek medical evaluation if you start to experience new or abnormal symptoms after taking one of these medicines, seek care immediately if you start to experience difficulty breathing, feeling of your throat closing, facial swelling, or rash as these could be indications of a more serious allergic reaction  If this medicine is too expensive ask the pharmacist about the over the counter combination pills.   Return to the ER for new or worsening symptoms including but not limited to inability to keep fluids down, abdominal pain, vaginal bleeding, dizziness, passing out, or any other concerns.

## 2019-08-01 NOTE — ED Provider Notes (Signed)
[place] EMERGENCY DEPARTMENT Provider Note   CSN: [id number] Arrival date & time: 08/01/19  1225     History Chief Complaint  Patient presents with  . Emesis  . Fever    Vicki Harper is a 24 y.o. female with a history of asthma who presents to the ED with complaints of N/V that began this AM. Patient states she had a low grade fever of 99.6 at work yesterday with subsequent hot flashes and chills throughout the day, she was able to stay well hydrated & go to sleep last night, but this AM woke up with nausea and subsequent emesis. Reports 4 episodes of non bloody emesis today, has not been able to keep anything down prompting ED visit. No alleviating/aggravating factors. Denies URI sxs, cough, dyspnea, chest pain, abdominal pain, diarrhea, melena, hematochezia, dysuria, or vaginal discharge/bleeding. LMP 07/04/19. Denies recent sick contacts w/ similar sxs. Denies known COVID 19 exposures. Denies suspicion PO intake, recent travel, or recent abx.   HPI     Past Medical History:  Diagnosis Date  . Asthma   . Back pain   . Headache     Patient Active Problem List   Diagnosis Date Noted  . Bilateral knee pain 11/23/2014    History reviewed. No pertinent surgical history.   OB History   No obstetric history on file.     No family history on file.  Social History   Tobacco Use  . Smoking status: Never Smoker  . Smokeless tobacco: Never Used  Substance Use Topics  . Alcohol use: No    Alcohol/week: 0.0 standard drinks  . Drug use: No    Home Medications Prior to Admission medications   Medication Sig Start Date End Date Taking? Authorizing Provider  albuterol (PROVENTIL HFA;VENTOLIN HFA) 108 (90 BASE) MCG/ACT inhaler Inhale 1-2 puffs into the lungs every 6 (six) hours as needed for wheezing or shortness of breath. 01/21/15  Yes Debby Freiberg, MD  albuterol (PROVENTIL) (2.5 MG/3ML) 0.083% nebulizer solution Take 2.5 mg by nebulization every 6 (six)  hours as needed for wheezing or shortness of breath.   Yes [provider]  cephALEXin (KEFLEX) 250 MG capsule Take 1 capsule (250 mg total) by mouth 4 (four) times daily. 06/03/19   Tedd Sias, PA  meloxicam (MOBIC) 15 MG tablet Take 1 tablet (15 mg total) by mouth daily. 11/16/14   Jeannett Senior, PA-C    Allergies    Patient has no known allergies.  Review of Systems   Review of Systems  Constitutional: Positive for chills and fever.  HENT: Negative for congestion, ear pain and sore throat.   Respiratory: Negative for cough and shortness of breath.   Cardiovascular: Negative for chest pain.  Gastrointestinal: Positive for nausea and vomiting. Negative for abdominal pain, blood in stool, constipation and diarrhea.  Genitourinary: Negative for dysuria, vaginal bleeding and vaginal discharge.  All other systems reviewed and are negative.   Physical Exam Updated Vital Signs BP 115/75 (BP Location: Right Arm)   Pulse 92   Temp 98.2 F (36.8 C) (Oral)   Resp 18   Ht 5\' 7"  (1.702 m)   Wt 65.8 kg   LMP 07/05/2019   SpO2 100%   BMI 22.71 kg/m   Physical Exam Vitals and nursing note reviewed.  Constitutional:      General: She is not in acute distress.    Appearance: She is well-developed. She is not toxic-appearing.  HENT:     Head: Normocephalic  and atraumatic.  Eyes:     General:        Right eye: No discharge.        Left eye: No discharge.     Conjunctiva/sclera: Conjunctivae normal.  Cardiovascular:     Rate and Rhythm: Normal rate and regular rhythm.  Pulmonary:     Effort: Pulmonary effort is normal. No respiratory distress.     Breath sounds: Normal breath sounds. No wheezing, rhonchi or rales.  Abdominal:     General: There is no distension.     Palpations: Abdomen is soft.     Tenderness: There is no abdominal tenderness. There is no right CVA tenderness, left CVA tenderness, guarding or rebound. Negative signs include Murphy's sign and  McBurney's sign.  Musculoskeletal:     Cervical back: Neck supple. No rigidity.  Skin:    General: Skin is warm and dry.     Findings: No rash.  Neurological:     Mental Status: She is alert.     Comments: Clear speech.   Psychiatric:        Behavior: Behavior normal.     ED Results / Procedures / Treatments   Labs (all labs ordered are listed, but only abnormal results are displayed) Labs Reviewed  PREGNANCY, URINE - Abnormal; Notable for the following components:      Result Value   Preg Test, Ur POSITIVE (*)    All other components within normal limits  COMPREHENSIVE METABOLIC PANEL - Abnormal; Notable for the following components:   Alkaline Phosphatase 36 (*)    All other components within normal limits  CBC WITH DIFFERENTIAL/PLATELET - Abnormal; Notable for the following components:   Lymphs Abs 0.3 (*)    All other components within normal limits  LIPASE, BLOOD  URINALYSIS, ROUTINE W REFLEX MICROSCOPIC    EKG None  Radiology No results found.  Procedures Procedures (including critical care time)  Medications Ordered in ED Medications - No data to display  ED Course  I have reviewed the triage vital signs and the nursing notes.  Pertinent labs & imaging results that were available during my care of the patient were reviewed by me and considered in my medical decision making (see chart for details).    Vicki Harper was evaluated in Emergency Department on 08/01/2019 for the symptoms described in the history of present illness. He/she was evaluated in the context of the global COVID-19 pandemic, which necessitated consideration that the patient might be at risk for infection with the SARS-CoV-2 virus that causes COVID-19. Institutional protocols and algorithms that pertain to the evaluation of patients at risk for COVID-19 are in a state of rapid change based on information released by regulatory bodies including the CDC and federal and state organizations. These  policies and algorithms were followed during the patient's care in the ED.  MDM Rules/Calculators/A&P                      Patient presents to the ED for evaluation of N/V that started today. Reports temp of 99.6 yesterday with some hot flashes. Reviewed nursing notes and chart for additional history. Nontoxic appearing, in no apparent distress, vitals WNL, afebrile in the ED. No URI sxs. No meningismus. Lungs CTA. Abdomen soft nontender w/o peritoneal signs. Plan for zofran, fluids, & labs.   Labs reviewed & compared to prior on chart review, overall reassuring, no leukocytosis, anemia, electrolyte derangement or abnormalities with LFTs, renal function or lipase. UA w/o UTI.  Pregnancy test is positive- G1P0A0- likely cause of sxs. No abdominal pain per patient, abdomen is nontender, no vaginal bleeding, therefore not suspicious for ectopic currently, do not feel [place] is necessary emergently. Repeat abdominal exam remains w/o peritoneal signs do not suspect appendicitis, cholecystitis, perf, obstruction, pancreatitis, or PID. Patient feeling improved s/p fluids & zofran. Appears appropriate for discharge home with OBGYN follow up. Discussed starting prenatals, will prescribe diclegis. I discussed results, treatment plan, need for follow-up, and return precautions with the patient. Provided opportunity for questions, patient confirmed understanding and is in agreement with plan.   Findings and plan of care discussed with supervising physician Dr. Ashok Cordia who is in agreement.   Final Clinical Impression(s) / ED Diagnoses Final diagnoses:  Nausea and vomiting in pregnancy    Rx / DC Orders ED Discharge Orders         Ordered    Doxylamine-Pyridoxine (DICLEGIS) 10-10 MG TBEC     08/01/19 [place], [place], PA-C 08/01/19 1411    Lajean Saver, MD 08/02/19 [phone]

## 2019-08-08 ENCOUNTER — Other Ambulatory Visit: Payer: Self-pay

## 2019-08-08 ENCOUNTER — Emergency Department (HOSPITAL_BASED_OUTPATIENT_CLINIC_OR_DEPARTMENT_OTHER)
Admission: EM | Admit: 2019-08-08 | Discharge: 2019-08-08 | Disposition: A | Discharge status: Home / Self Care | Payer: Medicaid Other | Attending: Emergency Medicine | Admitting: Emergency Medicine

## 2019-08-08 ENCOUNTER — Encounter (HOSPITAL_BASED_OUTPATIENT_CLINIC_OR_DEPARTMENT_OTHER): Payer: Self-pay | Admitting: Emergency Medicine

## 2019-08-08 DIAGNOSIS — J45909 Unspecified asthma, uncomplicated: Secondary | ICD-10-CM | POA: Insufficient documentation

## 2019-08-08 DIAGNOSIS — Z79899 Other long term (current) drug therapy: Secondary | ICD-10-CM | POA: Insufficient documentation

## 2019-08-08 DIAGNOSIS — O99511 Diseases of the respiratory system complicating pregnancy, first trimester: Secondary | ICD-10-CM | POA: Insufficient documentation

## 2019-08-08 DIAGNOSIS — Z3A01 Less than 8 weeks gestation of pregnancy: Secondary | ICD-10-CM | POA: Diagnosis not present

## 2019-08-08 DIAGNOSIS — O218 Other vomiting complicating pregnancy: Secondary | ICD-10-CM | POA: Diagnosis not present

## 2019-08-08 DIAGNOSIS — O219 Vomiting of pregnancy, unspecified: Secondary | ICD-10-CM | POA: Diagnosis present

## 2019-08-08 DIAGNOSIS — R112 Nausea with vomiting, unspecified: Secondary | ICD-10-CM

## 2019-08-08 LAB — URINALYSIS, ROUTINE W REFLEX MICROSCOPIC
Bilirubin Urine: NEGATIVE
Glucose, UA: NEGATIVE mg/dL
Hgb urine dipstick: NEGATIVE
Ketones, ur: 15 mg/dL — AB
Leukocytes,Ua: NEGATIVE
Nitrite: NEGATIVE
Protein, ur: NEGATIVE mg/dL
Specific Gravity, Urine: >1.03 — ABNORMAL HIGH (ref 1.005–1.030)
pH: 6 (ref 5.0–8.0)

## 2019-08-08 LAB — CBC WITH DIFFERENTIAL/PLATELET
Abs Immature Granulocytes: 0.02 10*3/uL (ref 0.00–0.07)
Basophils Absolute: 0 10*3/uL (ref 0.0–0.1)
Basophils Relative: 0 %
Eosinophils Absolute: 0 10*3/uL (ref 0.0–0.5)
Eosinophils Relative: 0 %
HCT: 37.5 % (ref 36.0–46.0)
Hemoglobin: 12.3 g/dL (ref 12.0–15.0)
Immature Granulocytes: 0 %
Lymphocytes Relative: 24 %
Lymphs Abs: 1.5 10*3/uL (ref 0.7–4.0)
MCH: 30.8 pg (ref 26.0–34.0)
MCHC: 32.8 g/dL (ref 30.0–36.0)
MCV: 94 fL (ref 80.0–100.0)
Monocytes Absolute: 0.3 10*3/uL (ref 0.1–1.0)
Monocytes Relative: 5 %
Neutro Abs: 4.4 10*3/uL (ref 1.7–7.7)
Neutrophils Relative %: 71 %
Platelets: 276 10*3/uL (ref 150–400)
RBC: 3.99 MIL/uL (ref 3.87–5.11)
RDW: 12.2 % (ref 11.5–15.5)
WBC: 6.2 10*3/uL (ref 4.0–10.5)
nRBC: 0 % (ref 0.0–0.2)

## 2019-08-08 LAB — COMPREHENSIVE METABOLIC PANEL
ALT: 10 U/L (ref 0–44)
AST: 16 U/L (ref 15–41)
Albumin: 3.9 g/dL (ref 3.5–5.0)
Alkaline Phosphatase: 33 U/L — ABNORMAL LOW (ref 38–126)
Anion gap: 8 (ref 5–15)
BUN: 14 mg/dL (ref 6–20)
CO2: 23 mmol/L (ref 22–32)
Calcium: 9 mg/dL (ref 8.9–10.3)
Chloride: 105 mmol/L (ref 98–111)
Creatinine, Ser: 0.79 mg/dL (ref 0.44–1.00)
GFR calc Af Amer: >60 mL/min (ref >60)
GFR calc non Af Amer: >60 mL/min (ref >60)
Glucose, Bld: 84 mg/dL (ref 70–99)
Potassium: 3.8 mmol/L (ref 3.5–5.1)
Sodium: 136 mmol/L (ref 135–145)
Total Bilirubin: 0.5 mg/dL (ref 0.3–1.2)
Total Protein: 7 g/dL (ref 6.5–8.1)

## 2019-08-08 LAB — LIPASE, BLOOD: Lipase: 38 U/L (ref 11–51)

## 2019-08-08 MED ORDER — SODIUM CHLORIDE 0.9 % IV BOLUS
500.0000 mL | Freq: Once | INTRAVENOUS | Status: AC
  Administered 2019-08-08: 500 mL via INTRAVENOUS

## 2019-08-08 MED ORDER — ONDANSETRON 4 MG PO TBDP: 4.0000 mg | ORAL_TABLET | Freq: Two times a day (BID) | ORAL | 0 refills | Status: DC | PRN

## 2019-08-08 NOTE — Discharge Instructions (Addendum)
You have been diagnosed today with nausea and vomiting in the first trimester pregnancy.  At this time there does not appear to be the presence of an emergent medical condition, however there is always the potential for conditions to change. Please read and follow the below instructions.  Please return to the Emergency Department immediately for any new or worsening symptoms. Please be sure to follow up with your Primary Care Provider within one week regarding your visit today; please call their office to schedule an appointment even if you are feeling better for a follow-up visit. You may take the medication Zofran as prescribed to help with your nausea and vomiting related to your pregnancy.  As we discussed there are potential risks of this medication in pregnancy including fetal malformations.  Only use this medication when needed and only as prescribed. You have been given an ambulatory referral to OB/GYN at the [facility] in [place].  They should call you in the next 1-2 days to schedule you an appointment for follow-up visit and establishment of pregnancy care.  If you do not receive a call from them by midday on Thursday I advised that you call the number on your discharge paperwork to schedule an appointment for a follow-up.  Get help right away if: You have a fever. You are leaking fluid from your vagina. You have spotting or bleeding from your vagina. You have very bad belly cramping or pain. You gain or lose weight rapidly. You throw up blood. It may look like coffee grounds. You are around people who have [place] measles, fifth disease, or chickenpox. You have a very bad headache. You have shortness of breath. You have any kind of trauma, such as from a fall or a car accident. You have any new/concerning or worsening of symptoms  Please read the additional information packets attached to your discharge summary.  Do not take your medicine if  develop an itchy rash,  swelling in your mouth or lips, or difficulty breathing; call 911 and seek immediate emergency medical attention if this occurs.  Note: Portions of this text may have been transcribed using voice recognition software. Every effort was made to ensure accuracy; however, inadvertent computerized transcription errors may still be present.

## 2019-08-08 NOTE — ED Triage Notes (Signed)
[redacted] weeks pregnant. C/o N/V and taking zofran without relief.

## 2019-08-08 NOTE — ED Provider Notes (Signed)
[facility] EMERGENCY DEPARTMENT Provider Note   CSN: [id number] Arrival date & time: 08/08/19  1214     History Chief Complaint  Patient presents with  . Emesis During Pregnancy    Vicki Harper is a 24 y.o. female history of asthma otherwise healthy presents today for nausea and vomiting.  Patient reports she is fairly [redacted] weeks pregnant, she was diagnosed here on 08/01/2019 after she had presented with nausea and vomiting.  She reports that in the visit she was treated with 1 dose of Zofran which completely resolved her symptoms.  She was then discharged with Diclegis.  She reports that when she has the pharmacy the medication was too expensive so she has been using B6 and Unisom without relief of her symptoms.  She attempted to call [facility]'s [facility] at [facility] but reports she is unable to get anyone on the phone.  She reports that she was able to schedule [facility] this visit is not until the end of March.  She reports she is having difficulty obtaining Medicaid due to her lack of a due date and does not believe she will be able to get Medicaid until she has the appointment with the [facility].  Patient reports that for the past week she has had continued nausea, she reports intermittent vomiting, nonbloody/nonbilious few times over the past week, no vomiting today.  She is not on any medications aside from the Unisom and B6 in the past week.  She denies any fever/chills, headache, neck pain, chest pain/shortness of breath, cough/hemoptysis, abdominal pain, dysuria/hematuria, vaginal bleeding/vaginal discharge or any additional concerns today.  Abdomen patient has been taking prenatal vitamins daily. HPI     Past Medical History:  Diagnosis Date  . Asthma   . Back pain   . Headache     Patient Active Problem List   Diagnosis Date Noted  . Bilateral knee pain 11/23/2014    History reviewed. No pertinent surgical  history.   OB History    Gravida  1   Para      Term      Preterm      AB      Living        SAB      TAB      Ectopic      Multiple      Live Births              No family history on file.  Social History   Tobacco Use  . Smoking status: Never Smoker  . Smokeless tobacco: Never Used  Substance Use Topics  . Alcohol use: No    Alcohol/week: 0.0 standard drinks  . Drug use: No    Home Medications Prior to Admission medications   Medication Sig Start Date End Date Taking? Authorizing Provider  albuterol (PROVENTIL HFA;VENTOLIN HFA) 108 (90 BASE) MCG/ACT inhaler Inhale 1-2 puffs into the lungs every 6 (six) hours as needed for wheezing or shortness of breath. 01/21/15   Debby Freiberg, MD  albuterol (PROVENTIL) (2.5 MG/3ML) 0.083% nebulizer solution Take 2.5 mg by nebulization every 6 (six) hours as needed for wheezing or shortness of breath.    [provider]  cephALEXin (KEFLEX) 250 MG capsule Take 1 capsule (250 mg total) by mouth 4 (four) times daily. 06/03/19   Tedd Sias, PA  Doxylamine-Pyridoxine (DICLEGIS) 10-10 MG TBEC Initial: Two tablets at bedtime on day 1 and 2; if  symptoms persist, take 1 tablet in morning and 2 tablets at bedtime on day 3; if symptoms persist, may increase to 1 tablet in morning, 1 tablet mid-afternoon, and 2 tablets at bedtime on day 4 (maximum: doxylamine 40 mg/pyridoxine 40 mg (4 tablets) per day) 08/01/19   Petrucelli, Glynda Jaeger, PA-C  meloxicam (MOBIC) 15 MG tablet Take 1 tablet (15 mg total) by mouth daily. 11/16/14   Kirichenko, Tatyana, PA-C  ondansetron (ZOFRAN ODT) 4 MG disintegrating tablet Take 1 tablet (4 mg total) by mouth every 12 (twelve) hours as needed for nausea or vomiting. 08/08/19   Deliah Boston, PA-C    Allergies    Patient has no known allergies.  Review of Systems   Review of Systems Ten systems are reviewed and are negative for acute change except as noted in the HPI  Physical  Exam Updated Vital Signs BP 126/83 (BP Location: Left Arm)   Pulse 69   Temp 98.4 F (36.9 C) (Oral)   Resp 16   LMP 07/05/2019   SpO2 100%   Physical Exam Constitutional:      General: She is not in acute distress.    Appearance: Normal appearance. She is well-developed. She is not ill-appearing or diaphoretic.  HENT:     Head: Normocephalic and atraumatic.     Right Ear: External ear normal.     Left Ear: External ear normal.     Nose: Nose normal.  Eyes:     General: Vision grossly intact. Gaze aligned appropriately.     Pupils: Pupils are equal, round, and reactive to light.  Neck:     Trachea: Trachea and phonation normal. No tracheal deviation.  Pulmonary:     Effort: Pulmonary effort is normal. No respiratory distress.  Abdominal:     General: There is no distension.     Palpations: Abdomen is soft.     Tenderness: There is no abdominal tenderness. There is no guarding or rebound.  Genitourinary:    Comments: Deferred by patient Musculoskeletal:        General: Normal range of motion.     Cervical back: Normal range of motion.  Skin:    General: Skin is warm and dry.  Neurological:     Mental Status: She is alert.     GCS: GCS eye subscore is 4. GCS verbal subscore is 5. GCS motor subscore is 6.     Comments: Speech is clear and goal oriented, follows commands Major Cranial nerves without deficit, no facial droop Moves extremities without ataxia, coordination intact  Psychiatric:        Behavior: Behavior normal.     ED Results / Procedures / Treatments   Labs (all labs ordered are listed, but only abnormal results are displayed) Labs Reviewed  COMPREHENSIVE METABOLIC PANEL - Abnormal; Notable for the following components:      Result Value   Alkaline Phosphatase 33 (*)    All other components within normal limits  URINALYSIS, ROUTINE W REFLEX MICROSCOPIC - Abnormal; Notable for the following components:   APPearance HAZY (*)    Specific Gravity, Urine  >1.030 (*)    Ketones, ur 15 (*)    All other components within normal limits  CBC WITH DIFFERENTIAL/PLATELET  LIPASE, BLOOD    EKG EKG Interpretation  Date/Time:  Tuesday August 08 2019 13:11:01 EST Ventricular Rate:  61 PR Interval:    QRS Duration: 85 QT Interval:  417 QTC Calculation: 420 R Axis:   61 Text Interpretation:  Sinus rhythm Low voltage, precordial leads Borderline T abnormalities, anterior leads No previous ECGs available Confirmed by Gerlene Fee [phone]) on 08/08/2019 1:13:29 PM   Radiology No results found.  Procedures Procedures (including critical care time)  Medications Ordered in ED Medications  sodium chloride 0.9 % bolus 500 mL ( Intravenous Stopped 08/08/19 1357)    ED Course  I have reviewed the triage vital signs and the nursing notes.  Pertinent labs & imaging results that were available during my care of the patient were reviewed by me and considered in my medical decision making (see chart for details).    MDM Rules/Calculators/A&P                     24 year old female presents today for continued nausea and nonbloody/nonbilious emesis over the past week.  No emesis today but has had nausea.  She was given Zofran x1 during last ER visit with resolution of symptoms.  She has been using generic Diclegis since that time without relief.  She has had difficulty obtaining OB/GYN appointment and Medicaid due to likely due date.  She has an appointment for the end of March with an OB/GYN but is wanting an earlier appointment somewhere else.  Will obtain CBC, CMP, lipase, urinalysis and give fluid bolus for possible dehydration.  Additionally obtain EKG to assess QT prior to giving Zofran. - CBC within normal limits no leukocytosis to suggest infection and no evidence of anemia.  Lipase within normal limits no evidence of pancreatitis.  CMP shows alk phos slightly decreased at 33, otherwise normal limits no emergent electrolyte derangement, evidence of kidney  injury or elevation of LFTs.  Urinalysis shows few ketones, suspect secondary to dehydration; no evidence of infection and CMP shows no evidence of DKA.  Patient reassessed resting comfortably no acute distress, she is received half liter fluid bolus, she reports she is feeling well and would like to be discharged.  Based on history work-up and physical examination no evidence of appendicitis, diverticulitis, SBO, perforation, ectopic or other emergent pathologies at this time. I had shared decision making with this patient about risks versus benefits of Zofran for hyperemesis.  She states understanding of potential understudy risks of fetal malformation and wishes to proceed with ODT Zofran.  Feel short Zofran prescription is appropriate at this time as patient appears to need escalating treatment as preferred agents have not been helpful.   Today calculation based on patient's reported last menstrual cycle of July 05, 2019 is April 10, 2020.  Plan to give patient ambulatory referral [facility] in hopes for patient of sooner follow-up for OB/GYN and to help with her insurance issues.  She is agreeable to this plan of care.  At this time there does not appear to be any evidence of an acute emergency medical condition and the patient appears stable for discharge with appropriate outpatient follow up. Diagnosis was discussed with patient who verbalizes understanding of care plan and is agreeable to discharge. I have discussed return precautions with patient who verbalizes understanding of return precautions. Patient encouraged to follow-up with their PCP and OBGYN. All questions answered.  Patient's case discussed with Dr. Sedonia Small who agrees with plan to discharge with follow-up.   Note: Portions of this report may have been transcribed using voice recognition software. Every effort was made to ensure accuracy; however, inadvertent computerized transcription errors may still be  present. Final Clinical Impression(s) / ED Diagnoses Final diagnoses:  Less than  [redacted] weeks gestation of pregnancy  Non-intractable vomiting with nausea, unspecified vomiting type    Rx / DC Orders ED Discharge Orders         Ordered    Ambulatory referral to Obstetrics / Gynecology    Comments: Pregnant needs to establish OBGYN; insurance difficulties; Due date Nov 3.   08/08/19 1334    ondansetron (ZOFRAN ODT) 4 MG disintegrating tablet  Every 12 hours PRN     08/08/19 1417           Gari Crown 08/08/19 1423    Maudie Flakes, MD 08/08/19 1435

## 2019-08-15 ENCOUNTER — Ambulatory Visit: Payer: Medicaid Other

## 2019-08-17 ENCOUNTER — Telehealth: Payer: Self-pay

## 2019-08-17 NOTE — Telephone Encounter (Signed)
Pt called the office requesting a Rx for nausea and vomiting. Pt states she has a paper Rx for Diclegis from her ER visit in February.States she did not have Rx filled because she didn't have insurance at that time. Advised pt to try to get the Diclegis Rx filled. Understanding was voiced. Vicki Harper, CMA

## 2019-08-18 ENCOUNTER — Other Ambulatory Visit: Payer: Self-pay

## 2019-08-18 ENCOUNTER — Encounter (HOSPITAL_COMMUNITY): Payer: Self-pay | Admitting: Obstetrics and Gynecology

## 2019-08-18 ENCOUNTER — Inpatient Hospital Stay (HOSPITAL_COMMUNITY)
Admission: AD | Admit: 2019-08-18 | Discharge: 2019-08-18 | Disposition: A | Discharge status: Home / Self Care | Payer: Medicaid Other | Attending: Obstetrics and Gynecology | Admitting: Obstetrics and Gynecology

## 2019-08-18 DIAGNOSIS — Z3A01 Less than 8 weeks gestation of pregnancy: Secondary | ICD-10-CM | POA: Insufficient documentation

## 2019-08-18 DIAGNOSIS — O218 Other vomiting complicating pregnancy: Secondary | ICD-10-CM

## 2019-08-18 DIAGNOSIS — O211 Hyperemesis gravidarum with metabolic disturbance: Secondary | ICD-10-CM | POA: Insufficient documentation

## 2019-08-18 DIAGNOSIS — O210 Mild hyperemesis gravidarum: Secondary | ICD-10-CM | POA: Diagnosis present

## 2019-08-18 DIAGNOSIS — E860 Dehydration: Secondary | ICD-10-CM

## 2019-08-18 LAB — URINALYSIS, ROUTINE W REFLEX MICROSCOPIC
Bilirubin Urine: NEGATIVE
Glucose, UA: NEGATIVE mg/dL
Hgb urine dipstick: NEGATIVE
Ketones, ur: 80 mg/dL — AB
Leukocytes,Ua: NEGATIVE
Nitrite: NEGATIVE
Protein, ur: 30 mg/dL — AB
Specific Gravity, Urine: 1.03 (ref 1.005–1.030)
pH: 6 (ref 5.0–8.0)

## 2019-08-18 LAB — BASIC METABOLIC PANEL
Anion gap: 9 (ref 5–15)
BUN: 10 mg/dL (ref 6–20)
CO2: 22 mmol/L (ref 22–32)
Calcium: 9.1 mg/dL (ref 8.9–10.3)
Chloride: 105 mmol/L (ref 98–111)
Creatinine, Ser: 0.83 mg/dL (ref 0.44–1.00)
GFR calc Af Amer: >60 mL/min (ref >60)
GFR calc non Af Amer: >60 mL/min (ref >60)
Glucose, Bld: 77 mg/dL (ref 70–99)
Potassium: 4.1 mmol/L (ref 3.5–5.1)
Sodium: 136 mmol/L (ref 135–145)

## 2019-08-18 LAB — CBC
HCT: 38.2 % (ref 36.0–46.0)
Hemoglobin: 12.6 g/dL (ref 12.0–15.0)
MCH: 30.9 pg (ref 26.0–34.0)
MCHC: 33 g/dL (ref 30.0–36.0)
MCV: 93.6 fL (ref 80.0–100.0)
Platelets: 236 10*3/uL (ref 150–400)
RBC: 4.08 MIL/uL (ref 3.87–5.11)
RDW: 11.9 % (ref 11.5–15.5)
WBC: 6.9 10*3/uL (ref 4.0–10.5)
nRBC: 0 % (ref 0.0–0.2)

## 2019-08-18 MED ORDER — FAMOTIDINE IN NACL 20-0.9 MG/50ML-% IV SOLN
20.0000 mg | Freq: Once | INTRAVENOUS | Status: AC
  Administered 2019-08-18: 20 mg via INTRAVENOUS
  Filled 2019-08-18: qty 50

## 2019-08-18 MED ORDER — SCOPOLAMINE 1 MG/3DAYS TD PT72: 1.0000 | MEDICATED_PATCH | TRANSDERMAL | 1 refills | Status: DC

## 2019-08-18 MED ORDER — LACTATED RINGERS IV BOLUS
1000.0000 mL | Freq: Once | INTRAVENOUS | Status: AC
  Administered 2019-08-18: 1000 mL via INTRAVENOUS

## 2019-08-18 MED ORDER — PROMETHAZINE HCL 25 MG/ML IJ SOLN
25.0000 mg | Freq: Four times a day (QID) | INTRAMUSCULAR | Status: DC | PRN
  Administered 2019-08-18: 25 mg via INTRAVENOUS
  Filled 2019-08-18: qty 1

## 2019-08-18 MED ORDER — SCOPOLAMINE 1 MG/3DAYS TD PT72
1.0000 | MEDICATED_PATCH | TRANSDERMAL | Status: DC
  Administered 2019-08-18: 1.5 mg via TRANSDERMAL
  Filled 2019-08-18: qty 1

## 2019-08-18 MED ORDER — PROMETHAZINE HCL 25 MG PO TABS: 12.5000 mg | ORAL_TABLET | Freq: Four times a day (QID) | ORAL | 1 refills | Status: DC | PRN

## 2019-08-18 NOTE — Discharge Instructions (Signed)
Morning Sickness  Follow these instructions at home: Eating and drinking  Avoid the following: Drinking fluids with meals. Try not to drink anything during the 30 minutes before and after your meals. Drinking more than 1 cup of fluid at a time. Eating foods that trigger your symptoms. These may include spicy foods, coffee, high-fat foods, very sweet foods, and acidic foods. Skipping meals. Nausea can be more intense on an empty stomach. If you cannot tolerate food, do not force it. Try sucking on ice chips or other frozen items and make up for missed calories later. Lying down within 2 hours after eating. Being exposed to environmental triggers. These may include food smells, smoky rooms, closed spaces, rooms with strong smells, warm or humid places, overly loud and noisy rooms, and rooms with motion or flickering lights. Try eating meals in a well-ventilated area that is free of strong smells. Quick and sudden changes in your movement. Taking iron pills and multivitamins that contain iron. If you take prescription iron pills, do not stop taking them unless your health care provider approves. Preparing food. The smell of food can spoil your appetite or trigger nausea. To help relieve your symptoms: Listen to your body. Everyone is different and has different preferences. Find what works best for you. Eat and drink slowly. Eat 5-6 small meals daily instead of 3 large meals. Eating small meals and snacks can help you avoid an empty stomach. In the morning, before getting out of bed, eat a couple of crackers to avoid moving around on an empty stomach. Try eating starchy foods as these are usually tolerated well. Examples include cereal, toast, bread, potatoes, pasta, rice, and pretzels. Include at least 1 serving of protein with your meals and snacks. Protein options include lean meats, poultry, seafood, beans, nuts, nut butters, eggs, cheese, and yogurt. Try eating a protein-rich snack before  bed. Examples of a protein-rick snack include cheese and crackers or a peanut butter sandwich made with 1 slice of whole-wheat bread and 1 tsp (5 g) of peanut butter. Eat or suck on things that have ginger in them. It may help relieve nausea. Add  tsp ground ginger to hot tea or choose ginger tea. Try drinking 100% fruit juice or an electrolyte drink. An electrolyte drink contains sodium, potassium, and chloride. Drink fluids that are cold, clear, and carbonated or sour. Examples include lemonade, ginger ale, lemon-lime soda, ice water, and sparkling water. Brush your teeth or use a mouth rinse after meals. Talk with your health care provider about starting a supplement of vitamin B6. General instructions Take over-the-counter and prescription medicines only as told by your health care provider. Follow instructions from your health care provider about eating or drinking restrictions. Continue to take your prenatal vitamins as told by your health care provider. If you are having trouble taking your prenatal vitamins, talk with your health care provider about different options. Keep all follow-up and pre-birth (prenatal) visits as told by your health care provider. This is important. Contact a health care provider if: You have pain in your abdomen. You have a severe headache. You have vision problems. You are losing weight. You feel weak or dizzy. Get help right away if: You cannot drink fluids without vomiting. You vomit blood. You have constant nausea and vomiting. You are very weak. You faint. You have a fever and your symptoms suddenly get worse. Summary Hyperemesis gravidarum is a severe form of nausea and vomiting that happens during pregnancy. Making some changes to  your eating habits may help relieve nausea and vomiting. This condition may be managed with medicine. If medicines do not help relieve nausea and vomiting, you may need to receive fluids through an IV at the  hospital. This information is not intended to replace advice given to you by your health care provider. Make sure you discuss any questions you have with your health care provider. Document Revised: 06/14/2017 Document Reviewed: 01/22/2016 Elsevier Patient Education  [place].  Morning sickness is when you feel sick to your stomach (nauseous) during pregnancy. You may feel sick to your stomach and throw up (vomit). You may feel sick in the morning, but you can feel this way at any time of day. Some women feel very sick to their stomach and cannot stop throwing up (hyperemesis gravidarum). Follow these instructions at home: Medicines  Take over-the-counter and prescription medicines only as told by your doctor. Do not take any medicines until you talk with your doctor about them first.  Taking multivitamins before getting pregnant can stop or lessen the harshness of morning sickness. Eating and drinking  Eat dry toast or crackers before getting out of bed.  Eat 5 or 6 small meals a day.  Eat dry and bland foods like rice and baked potatoes.  Do not eat greasy, fatty, or spicy foods.  Have someone cook for you if the smell of food causes you to feel sick or throw up.  If you feel sick to your stomach after taking prenatal vitamins, take them at night or with a snack.  Eat protein when you need a snack. Nuts, yogurt, and cheese are good choices.  Drink fluids throughout the day.  Try ginger ale made with real ginger, ginger tea made from fresh grated ginger, or ginger candies. General instructions  Do not use any products that have nicotine or tobacco in them, such as cigarettes and e-cigarettes. If you need help quitting, ask your doctor.  Use an air purifier to keep the air in your house free of smells.  Get lots of fresh air.  Try to avoid smells that make you feel sick.  Try: ? Wearing a bracelet that is used for seasickness (acupressure wristband). ? Going to a  doctor who puts thin needles into certain body points (acupuncture) to improve how you feel. Contact a doctor if:  You need medicine to feel better.  You feel dizzy or light-headed.  You are losing weight. Get help right away if:  You feel very sick to your stomach and cannot stop throwing up.  You pass out (faint).  You have very bad pain in your belly. Summary  Morning sickness is when you feel sick to your stomach (nauseous) during pregnancy.  You may feel sick in the morning, but you can feel this way at any time of day.  Making some changes to what you eat may help your symptoms go away. This information is not intended to replace advice given to you by your health care provider. Make sure you discuss any questions you have with your health care provider. Document Revised: 05/07/2017 Document Reviewed: 06/25/2016 Elsevier Patient Education  2020 [employer].

## 2019-08-18 NOTE — MAU Note (Signed)
Pt tolerated Vicki Harper ale and saltine crackers

## 2019-08-18 NOTE — MAU Provider Note (Signed)
History     CSN: [id number]  Arrival date and time: 08/18/19 1427   First Provider Initiated Contact with Patient 08/18/19 1507      Chief Complaint  Patient presents with  . Morning Sickness   23 y.o. G1 @[redacted]w[redacted]d  presenting with N/V. Sx started 2 weeks ago. She initially tried Unisom and B6 which helped initially but then stopped. She was seen at UC 1 week ago and given Zofran which helped for a few days then stopped helping. She then tried Diclegis last night but it didn't help much. She has been unable to tolerate much of anything for the last 2 days. Denies VB or abd pain. Has lost 3 lbs.  OB History    Gravida  1   Para      Term      Preterm      AB      Living        SAB      TAB      Ectopic      Multiple      Live Births              Past Medical History:  Diagnosis Date  . Asthma   . Back pain   . Headache     History reviewed. No pertinent surgical history.  No family history on file.  Social History   Tobacco Use  . Smoking status: Never Smoker  . Smokeless tobacco: Never Used  Substance Use Topics  . Alcohol use: No    Alcohol/week: 0.0 standard drinks  . Drug use: No    Allergies: No Known Allergies  Medications Prior to Admission  Medication Sig Dispense Refill Last Dose  . albuterol (PROVENTIL HFA;VENTOLIN HFA) 108 (90 BASE) MCG/ACT inhaler Inhale 1-2 puffs into the lungs every 6 (six) hours as needed for wheezing or shortness of breath. 1 Inhaler 0 Past Month at Unknown time  . albuterol (PROVENTIL) (2.5 MG/3ML) 0.083% nebulizer solution Take 2.5 mg by nebulization every 6 (six) hours as needed for wheezing or shortness of breath.   Past Month at Unknown time  . Doxylamine-Pyridoxine (DICLEGIS) 10-10 MG TBEC Initial: Two tablets at bedtime on day 1 and 2; if symptoms persist, take 1 tablet in morning and 2 tablets at bedtime on day 3; if symptoms persist, may increase to 1 tablet in morning, 1 tablet mid-afternoon, and 2 tablets  at bedtime on day 4 (maximum: doxylamine 40 mg/pyridoxine 40 mg (4 tablets) per day) 30 tablet 0 08/18/2019 at Unknown time  . ondansetron (ZOFRAN ODT) 4 MG disintegrating tablet Take 1 tablet (4 mg total) by mouth every 12 (twelve) hours as needed for nausea or vomiting. 8 tablet 0 08/17/2019 at Unknown time  . cephALEXin (KEFLEX) 250 MG capsule Take 1 capsule (250 mg total) by mouth 4 (four) times daily. 28 capsule 0   . meloxicam (MOBIC) 15 MG tablet Take 1 tablet (15 mg total) by mouth daily. 30 tablet 0     Review of Systems  Constitutional: Positive for unexpected weight change. Negative for chills and fever.  Gastrointestinal: Positive for nausea and vomiting. Negative for abdominal pain.  Genitourinary: Negative for vaginal bleeding.   Physical Exam   Blood pressure (!) 90/45, pulse 62, temperature 98.1 F (36.7 C), resp. rate 16, height 5\' 6"  (1.676 m), weight 65.3 kg, last menstrual period 07/05/2019, SpO2 100 %.  Physical Exam  Nursing note and vitals reviewed. Constitutional: She is oriented to person, place, and  time. She appears well-developed and well-nourished. No distress.  HENT:  Head: Normocephalic and atraumatic.  Cardiovascular: Normal rate.  Respiratory: Effort normal. No respiratory distress.  Musculoskeletal:        General: Normal range of motion.     Cervical back: Normal range of motion.  Neurological: She is alert and oriented to person, place, and time.  Psychiatric: She has a normal mood and affect.   Results for orders placed or performed during the hospital encounter of 08/18/19 (from the past 24 hour(s))  Urinalysis, Routine w reflex microscopic     Status: Abnormal   Collection Time: 08/18/19  2:45 PM  Result Value Ref Range   Color, Urine YELLOW YELLOW   APPearance HAZY (A) CLEAR   Specific Gravity, Urine 1.030 1.005 - 1.030   pH 6.0 5.0 - 8.0   Glucose, UA NEGATIVE NEGATIVE mg/dL   Hgb urine dipstick NEGATIVE NEGATIVE   Bilirubin Urine NEGATIVE  NEGATIVE   Ketones, ur 80 (A) NEGATIVE mg/dL   Protein, ur 30 (A) NEGATIVE mg/dL   Nitrite NEGATIVE NEGATIVE   Leukocytes,Ua NEGATIVE NEGATIVE   RBC / HPF 0-5 0 - 5 RBC/hpf   WBC, UA 0-5 0 - 5 WBC/hpf   Bacteria, UA FEW (A) NONE SEEN   Squamous Epithelial / LPF 11-20 0 - 5   Mucus PRESENT    Budding Yeast PRESENT   CBC     Status: None   Collection Time: 08/18/19  4:44 PM  Result Value Ref Range   WBC 6.9 4.0 - 10.5 K/uL   RBC 4.08 3.87 - 5.11 MIL/uL   Hemoglobin 12.6 12.0 - 15.0 g/dL   HCT 38.2 36.0 - 46.0 %   MCV 93.6 80.0 - 100.0 fL   MCH 30.9 26.0 - 34.0 pg   MCHC 33.0 30.0 - 36.0 g/dL   RDW 11.9 11.5 - 15.5 %   Platelets 236 150 - 400 K/uL   nRBC 0.0 0.0 - 0.2 %  Basic metabolic panel     Status: None   Collection Time: 08/18/19  4:44 PM  Result Value Ref Range   Sodium 136 135 - 145 mmol/L   Potassium 4.1 3.5 - 5.1 mmol/L   Chloride 105 98 - 111 mmol/L   CO2 22 22 - 32 mmol/L   Glucose, Bld 77 70 - 99 mg/dL   BUN 10 6 - 20 mg/dL   Creatinine, Ser 0.83 0.44 - 1.00 mg/dL   Calcium 9.1 8.9 - 10.3 mg/dL   GFR calc non Af Amer >60 >60 mL/min   GFR calc Af Amer >60 >60 mL/min   Anion gap 9 5 - 15   MAU Course  Procedures Meds ordered this encounter  Medications  . scopolamine (TRANSDERM-SCOP) 1 MG/3DAYS 1.5 mg  . lactated ringers bolus 1,000 mL  . famotidine (PEPCID) IVPB 20 mg premix  . promethazine (PHENERGAN) injection 25 mg  . scopolamine (TRANSDERM-SCOP) 1 MG/3DAYS    Sig: Place 1 patch (1.5 mg total) onto the skin every 3 (three) days.    Dispense:  10 patch    Refill:  1    Order Specific Question:   Supervising Provider    Answer:   Donnamae Jude [id number]  . promethazine (PHENERGAN) 25 MG tablet    Sig: Take 0.5-1 tablets (12.5-25 mg total) by mouth every 6 (six) hours as needed for nausea or vomiting. May use vaginally if needed    Dispense:  30 tablet    Refill:  1  Order Specific Question:   Supervising Provider    Answer:   Merrily Pew    MDM Labs ordered and reviewed. No further emesis. Feeling better. Tolerating po. Stable for discharge home.   Assessment and Plan   1. [redacted] weeks gestation of pregnancy   2. Morning sickness   3. Dehydration    Discharge home Follow up at [facility] as scheduled Return precautions Rx Phenergan Rx Scopolamine Continue Diclegis  Allergies as of 08/18/2019   No Known Allergies     Medication List    STOP taking these medications   cephALEXin 250 MG capsule Commonly known as: KEFLEX   meloxicam 15 MG tablet Commonly known as: Mobic   ondansetron 4 MG disintegrating tablet Commonly known as: Zofran ODT     TAKE these medications   albuterol (2.5 MG/3ML) 0.083% nebulizer solution Commonly known as: PROVENTIL Take 2.5 mg by nebulization every 6 (six) hours as needed for wheezing or shortness of breath.   albuterol 108 (90 Base) MCG/ACT inhaler Commonly known as: VENTOLIN HFA Inhale 1-2 puffs into the lungs every 6 (six) hours as needed for wheezing or shortness of breath.   Doxylamine-Pyridoxine 10-10 MG Tbec Commonly known as: Diclegis Initial: Two tablets at bedtime on day 1 and 2; if symptoms persist, take 1 tablet in morning and 2 tablets at bedtime on day 3; if symptoms persist, may increase to 1 tablet in morning, 1 tablet mid-afternoon, and 2 tablets at bedtime on day 4 (maximum: doxylamine 40 mg/pyridoxine 40 mg (4 tablets) per day)   promethazine 25 MG tablet Commonly known as: PHENERGAN Take 0.5-1 tablets (12.5-25 mg total) by mouth every 6 (six) hours as needed for nausea or vomiting. May use vaginally if needed   scopolamine 1 MG/3DAYS Commonly known as: TRANSDERM-SCOP Place 1 patch (1.5 mg total) onto the skin every 3 (three) days. Start taking on: August 21, 2019      Julianne Handler, [place] 08/18/2019, 6:23 PM

## 2019-08-30 ENCOUNTER — Other Ambulatory Visit: Payer: Self-pay

## 2019-08-30 ENCOUNTER — Inpatient Hospital Stay (HOSPITAL_COMMUNITY)
Admission: AD | Admit: 2019-08-30 | Discharge: 2019-08-30 | Disposition: A | Discharge status: Home / Self Care | Payer: Medicaid Other | Attending: Obstetrics & Gynecology | Admitting: Obstetrics & Gynecology

## 2019-08-30 ENCOUNTER — Encounter (HOSPITAL_COMMUNITY): Payer: Self-pay | Admitting: Obstetrics & Gynecology

## 2019-08-30 DIAGNOSIS — Z3A08 8 weeks gestation of pregnancy: Secondary | ICD-10-CM | POA: Diagnosis not present

## 2019-08-30 DIAGNOSIS — O211 Hyperemesis gravidarum with metabolic disturbance: Secondary | ICD-10-CM

## 2019-08-30 DIAGNOSIS — O210 Mild hyperemesis gravidarum: Secondary | ICD-10-CM | POA: Diagnosis present

## 2019-08-30 DIAGNOSIS — O219 Vomiting of pregnancy, unspecified: Secondary | ICD-10-CM

## 2019-08-30 LAB — COMPREHENSIVE METABOLIC PANEL
ALT: 10 U/L (ref 0–44)
AST: 15 U/L (ref 15–41)
Albumin: 3.7 g/dL (ref 3.5–5.0)
Alkaline Phosphatase: 30 U/L — ABNORMAL LOW (ref 38–126)
Anion gap: 10 (ref 5–15)
BUN: 12 mg/dL (ref 6–20)
CO2: 21 mmol/L — ABNORMAL LOW (ref 22–32)
Calcium: 9.5 mg/dL (ref 8.9–10.3)
Chloride: 105 mmol/L (ref 98–111)
Creatinine, Ser: 0.81 mg/dL (ref 0.44–1.00)
GFR calc Af Amer: >60 mL/min (ref >60)
GFR calc non Af Amer: >60 mL/min (ref >60)
Glucose, Bld: 79 mg/dL (ref 70–99)
Potassium: 4 mmol/L (ref 3.5–5.1)
Sodium: 136 mmol/L (ref 135–145)
Total Bilirubin: 0.9 mg/dL (ref 0.3–1.2)
Total Protein: 6.8 g/dL (ref 6.5–8.1)

## 2019-08-30 LAB — URINALYSIS, ROUTINE W REFLEX MICROSCOPIC
Bilirubin Urine: NEGATIVE
Glucose, UA: NEGATIVE mg/dL
Hgb urine dipstick: NEGATIVE
Ketones, ur: 80 mg/dL — AB
Nitrite: NEGATIVE
Protein, ur: 30 mg/dL — AB
Specific Gravity, Urine: 1.028 (ref 1.005–1.030)
pH: 6 (ref 5.0–8.0)

## 2019-08-30 MED ORDER — ONDANSETRON 4 MG PO TBDP: 4.0000 mg | ORAL_TABLET | Freq: Four times a day (QID) | ORAL | 0 refills | Status: DC | PRN

## 2019-08-30 MED ORDER — METOCLOPRAMIDE HCL 10 MG PO TABS: 10.0000 mg | ORAL_TABLET | Freq: Four times a day (QID) | ORAL | 0 refills | Status: DC | PRN

## 2019-08-30 MED ORDER — FAMOTIDINE 20 MG PO TABS: 20.0000 mg | ORAL_TABLET | Freq: Two times a day (BID) | ORAL | 0 refills | Status: DC

## 2019-08-30 MED ORDER — METOCLOPRAMIDE HCL 5 MG/ML IJ SOLN
10.0000 mg | Freq: Once | INTRAMUSCULAR | Status: AC
  Administered 2019-08-30: 10 mg via INTRAVENOUS
  Filled 2019-08-30: qty 2

## 2019-08-30 MED ORDER — FAMOTIDINE IN NACL 20-0.9 MG/50ML-% IV SOLN
20.0000 mg | Freq: Once | INTRAVENOUS | Status: AC
  Administered 2019-08-30: 20 mg via INTRAVENOUS
  Filled 2019-08-30: qty 50

## 2019-08-30 MED ORDER — POTASSIUM CHLORIDE 2 MEQ/ML IV SOLN
Freq: Once | INTRAVENOUS | Status: AC
  Filled 2019-08-30: qty 1000

## 2019-08-30 MED ORDER — LACTATED RINGERS IV BOLUS
1000.0000 mL | Freq: Once | INTRAVENOUS | Status: AC
  Administered 2019-08-30: 1000 mL via INTRAVENOUS

## 2019-08-30 NOTE — Discharge Instructions (Signed)

## 2019-08-30 NOTE — MAU Provider Note (Addendum)
History     CSN: [id number]  Arrival date and time: 08/30/19 1758   First Provider Initiated Contact with Patient 08/30/19 1854      Chief Complaint  Patient presents with  . Emesis  . Nausea   HPI   [name] is a 24 y.o. female G1P0 @ [redacted]w[redacted]d here in MAU with N.V. This is not a new problem. She was seen for this same complaint on 3/12 and was sent home with scopolamine patch, diclegis, and phenergan. States she is taking diclegis 2-3 times per day, and phenergan at night. She reports a 6lb weight loss. She is able to keep some things down and attests to having some good days and bad. No pain or bleeding.   OB History    Gravida  1   Para      Term      Preterm      AB      Living        SAB      TAB      Ectopic      Multiple      Live Births              Past Medical History:  Diagnosis Date  . Asthma   . Back pain   . Headache     History reviewed. No pertinent surgical history.  History reviewed. No pertinent family history.  Social History   Tobacco Use  . Smoking status: Never Smoker  . Smokeless tobacco: Never Used  Substance Use Topics  . Alcohol use: No    Alcohol/week: 0.0 standard drinks  . Drug use: No    Allergies: No Known Allergies  Medications Prior to Admission  Medication Sig Dispense Refill Last Dose  . Doxylamine-Pyridoxine (DICLEGIS) 10-10 MG TBEC Initial: Two tablets at bedtime on day 1 and 2; if symptoms persist, take 1 tablet in morning and 2 tablets at bedtime on day 3; if symptoms persist, may increase to 1 tablet in morning, 1 tablet mid-afternoon, and 2 tablets at bedtime on day 4 (maximum: doxylamine 40 mg/pyridoxine 40 mg (4 tablets) per day) 30 tablet 0 08/30/2019 at Unknown time  . promethazine (PHENERGAN) 25 MG tablet Take 0.5-1 tablets (12.5-25 mg total) by mouth every 6 (six) hours as needed for nausea or vomiting. May use vaginally if needed 30 tablet 1 08/29/2019 at Unknown time  . scopolamine  (TRANSDERM-SCOP) 1 MG/3DAYS Place 1 patch (1.5 mg total) onto the skin every 3 (three) days. 10 patch 1 08/30/2019 at Unknown time  . albuterol (PROVENTIL HFA;VENTOLIN HFA) 108 (90 BASE) MCG/ACT inhaler Inhale 1-2 puffs into the lungs every 6 (six) hours as needed for wheezing or shortness of breath. 1 Inhaler 0 More than a month at Unknown time  . albuterol (PROVENTIL) (2.5 MG/3ML) 0.083% nebulizer solution Take 2.5 mg by nebulization every 6 (six) hours as needed for wheezing or shortness of breath.      Results for orders placed or performed during the hospital encounter of 08/30/19 (from the past 48 hour(s))  Urinalysis, Routine w reflex microscopic     Status: Abnormal   Collection Time: 08/30/19  6:29 PM  Result Value Ref Range   Color, Urine YELLOW YELLOW   APPearance HAZY (A) CLEAR   Specific Gravity, Urine 1.028 1.005 - 1.030   pH 6.0 5.0 - 8.0   Glucose, UA NEGATIVE NEGATIVE mg/dL   Hgb urine dipstick NEGATIVE NEGATIVE   Bilirubin Urine NEGATIVE NEGATIVE  Ketones, ur 80 (A) NEGATIVE mg/dL   Protein, ur 30 (A) NEGATIVE mg/dL   Nitrite NEGATIVE NEGATIVE   Leukocytes,Ua TRACE (A) NEGATIVE   RBC / HPF 0-5 0 - 5 RBC/hpf   WBC, UA 0-5 0 - 5 WBC/hpf   Bacteria, UA RARE (A) NONE SEEN   Squamous Epithelial / LPF 21-50 0 - 5   Mucus PRESENT     Comment: Performed at [facility], [place] [address] [place], [place] [address]   Results for orders placed or performed during the hospital encounter of 08/30/19 (from the past 48 hour(s))  Urinalysis, Routine w reflex microscopic     Status: Abnormal   Collection Time: 08/30/19  6:29 PM  Result Value Ref Range   Color, Urine YELLOW YELLOW   APPearance HAZY (A) CLEAR   Specific Gravity, Urine 1.028 1.005 - 1.030   pH 6.0 5.0 - 8.0   Glucose, UA NEGATIVE NEGATIVE mg/dL   Hgb urine dipstick NEGATIVE NEGATIVE   Bilirubin Urine NEGATIVE NEGATIVE   Ketones, ur 80 (A) NEGATIVE mg/dL   Protein, ur 30 (A) NEGATIVE mg/dL   Nitrite NEGATIVE  NEGATIVE   Leukocytes,Ua TRACE (A) NEGATIVE   RBC / HPF 0-5 0 - 5 RBC/hpf   WBC, UA 0-5 0 - 5 WBC/hpf   Bacteria, UA RARE (A) NONE SEEN   Squamous Epithelial / LPF 21-50 0 - 5   Mucus PRESENT     Comment: Performed at [facility], [address], [place], [place] [address]  Comprehensive metabolic panel     Status: Abnormal   Collection Time: 08/30/19  7:11 PM  Result Value Ref Range   Sodium 136 135 - 145 mmol/L   Potassium 4.0 3.5 - 5.1 mmol/L   Chloride 105 98 - 111 mmol/L   CO2 21 (L) 22 - 32 mmol/L   Glucose, Bld 79 70 - 99 mg/dL    Comment: Glucose reference range applies only to samples taken after fasting for at least 8 hours.   BUN 12 6 - 20 mg/dL   Creatinine, Ser 0.81 0.44 - 1.00 mg/dL   Calcium 9.5 8.9 - 10.3 mg/dL   Total Protein 6.8 6.5 - 8.1 g/dL   Albumin 3.7 3.5 - 5.0 g/dL   AST 15 15 - 41 U/L   ALT 10 0 - 44 U/L   Alkaline Phosphatase 30 (L) 38 - 126 U/L   Total Bilirubin 0.9 0.3 - 1.2 mg/dL   GFR calc non Af Amer >60 >60 mL/min   GFR calc Af Amer >60 >60 mL/min   Anion gap 10 5 - 15    Comment: Performed at [facility], [place] [address], [place],  [address]   Review of Systems  Constitutional: Negative for fever.  Gastrointestinal: Positive for nausea and vomiting. Negative for abdominal pain and diarrhea.  Genitourinary: Negative for vaginal bleeding.   Physical Exam   Blood pressure 102/65, pulse 87, temperature 98.7 F (37.1 C), temperature source Oral, resp. rate 17, height 5\' 6"  (1.676 m), weight 64.2 kg, last menstrual period 07/05/2019, SpO2 100 %.  Physical Exam  Constitutional: She is oriented to person, place, and time. She appears well-developed and well-nourished. No distress.  Respiratory: Effort normal.  Musculoskeletal:        General: Normal range of motion.  Neurological: She is alert and oriented to person, place, and time.  Skin: Skin is warm. She is not diaphoretic.  Psychiatric: Her behavior is normal.  MAU  Course  Procedures  None  MDM  UA shows > 80 ketones LR bolus x 1 MVI with K+ ordered X 1 Pepcid, Reglan given IV CMP Report given to M. Jimmye Norman CNM who resumes care of the patient.   Rasch, Artist Pais, NP  Felt better after treatment Able to tolerate PO intake.  Assessment and Plan  A:  SIUP at [redacted]w[redacted]d      Nausea and vomiting of pregnancy      Dehydration  P:  Discharge home       Add Rx for reglan for nausea      Add Rx for pepcid      Will provide back up Rx for zofran but reviewed risk of birth defects and constipation, recommend use it if the others fail       Advance diet as tolerated      Followup in office Encouraged to return here or to other Urgent Care/ED if she develops worsening of symptoms, increase in pain, fever, or other concerning symptoms.    Seabron Spates, CNM

## 2019-08-30 NOTE — MAU Note (Signed)
Vicki Harper is a 24 y.o. at [redacted]w[redacted]d here in MAU reporting: states she has hyperemesis gravidarum. States she cannot keep anything down. States she has good and bad days. Has been taking her meds at home but it doesn't always work. Emesis x 4 in the past 24 hours. Intermittent nausea.   Onset of complaint: ongoing  Pain score: 0/10  Vitals:   08/30/19 1811  BP: 102/65  Pulse: 87  Resp: 17  Temp: 98.7 F (37.1 C)  SpO2: 100%     Lab orders placed from triage: UA

## 2019-09-02 ENCOUNTER — Other Ambulatory Visit: Payer: Self-pay

## 2019-09-02 ENCOUNTER — Inpatient Hospital Stay (HOSPITAL_COMMUNITY)
Admission: AD | Admit: 2019-09-02 | Discharge: 2019-09-02 | Disposition: A | Discharge status: Home / Self Care | Payer: Medicaid Other | Attending: Obstetrics & Gynecology | Admitting: Obstetrics & Gynecology

## 2019-09-02 ENCOUNTER — Encounter (HOSPITAL_COMMUNITY): Payer: Self-pay | Admitting: Obstetrics & Gynecology

## 2019-09-02 DIAGNOSIS — O210 Mild hyperemesis gravidarum: Secondary | ICD-10-CM | POA: Insufficient documentation

## 2019-09-02 DIAGNOSIS — Z79899 Other long term (current) drug therapy: Secondary | ICD-10-CM | POA: Diagnosis not present

## 2019-09-02 DIAGNOSIS — O26891 Other specified pregnancy related conditions, first trimester: Secondary | ICD-10-CM | POA: Diagnosis present

## 2019-09-02 DIAGNOSIS — J45909 Unspecified asthma, uncomplicated: Secondary | ICD-10-CM | POA: Insufficient documentation

## 2019-09-02 DIAGNOSIS — Z3A08 8 weeks gestation of pregnancy: Secondary | ICD-10-CM | POA: Diagnosis not present

## 2019-09-02 DIAGNOSIS — O99511 Diseases of the respiratory system complicating pregnancy, first trimester: Secondary | ICD-10-CM | POA: Diagnosis not present

## 2019-09-02 DIAGNOSIS — R42 Dizziness and giddiness: Secondary | ICD-10-CM | POA: Insufficient documentation

## 2019-09-02 LAB — CBC
HCT: 37.8 % (ref 36.0–46.0)
Hemoglobin: 12.8 g/dL (ref 12.0–15.0)
MCH: 30.5 pg (ref 26.0–34.0)
MCHC: 33.9 g/dL (ref 30.0–36.0)
MCV: 90.2 fL (ref 80.0–100.0)
Platelets: 230 10*3/uL (ref 150–400)
RBC: 4.19 MIL/uL (ref 3.87–5.11)
RDW: 11.8 % (ref 11.5–15.5)
WBC: 9.4 10*3/uL (ref 4.0–10.5)
nRBC: 0 % (ref 0.0–0.2)

## 2019-09-02 LAB — URINALYSIS, ROUTINE W REFLEX MICROSCOPIC
Bilirubin Urine: NEGATIVE
Glucose, UA: NEGATIVE mg/dL
Hgb urine dipstick: NEGATIVE
Ketones, ur: NEGATIVE mg/dL
Leukocytes,Ua: NEGATIVE
Nitrite: NEGATIVE
Protein, ur: 30 mg/dL — AB
Specific Gravity, Urine: 1.025 (ref 1.005–1.030)
pH: 7 (ref 5.0–8.0)

## 2019-09-02 LAB — COMPREHENSIVE METABOLIC PANEL
ALT: 11 U/L (ref 0–44)
AST: 15 U/L (ref 15–41)
Albumin: 3.7 g/dL (ref 3.5–5.0)
Alkaline Phosphatase: 28 U/L — ABNORMAL LOW (ref 38–126)
Anion gap: 11 (ref 5–15)
BUN: 8 mg/dL (ref 6–20)
CO2: 22 mmol/L (ref 22–32)
Calcium: 9.6 mg/dL (ref 8.9–10.3)
Chloride: 104 mmol/L (ref 98–111)
Creatinine, Ser: 0.71 mg/dL (ref 0.44–1.00)
GFR calc Af Amer: >60 mL/min (ref >60)
GFR calc non Af Amer: >60 mL/min (ref >60)
Glucose, Bld: 92 mg/dL (ref 70–99)
Potassium: 4.1 mmol/L (ref 3.5–5.1)
Sodium: 137 mmol/L (ref 135–145)
Total Bilirubin: 0.6 mg/dL (ref 0.3–1.2)
Total Protein: 6.5 g/dL (ref 6.5–8.1)

## 2019-09-02 MED ORDER — METOCLOPRAMIDE HCL 10 MG PO TABS: 10.0000 mg | ORAL_TABLET | Freq: Four times a day (QID) | ORAL | 1 refills | Status: DC | PRN

## 2019-09-02 MED ORDER — ONDANSETRON 4 MG PO TBDP
8.0000 mg | ORAL_TABLET | Freq: Once | ORAL | Status: AC
  Administered 2019-09-02: 8 mg via ORAL
  Filled 2019-09-02: qty 2

## 2019-09-02 NOTE — Discharge Instructions (Signed)
Morning Sickness ° °Morning sickness is when you feel sick to your stomach (nauseous) during pregnancy. You may feel sick to your stomach and throw up (vomit). You may feel sick in the morning, but you can feel this way at any time of day. Some women feel very sick to their stomach and cannot stop throwing up (hyperemesis gravidarum). °Follow these instructions at home: °Medicines °· Take over-the-counter and prescription medicines only as told by your doctor. Do not take any medicines until you talk with your doctor about them first. °· Taking multivitamins before getting pregnant can stop or lessen the harshness of morning sickness. °Eating and drinking °· Eat dry toast or crackers before getting out of bed. °· Eat 5 or 6 small meals a day. °· Eat dry and bland foods like rice and baked potatoes. °· Do not eat greasy, fatty, or spicy foods. °· Have someone cook for you if the smell of food causes you to feel sick or throw up. °· If you feel sick to your stomach after taking prenatal vitamins, take them at night or with a snack. °· Eat protein when you need a snack. Nuts, yogurt, and cheese are good choices. °· Drink fluids throughout the day. °· Try ginger ale made with real ginger, ginger tea made from fresh grated ginger, or ginger candies. °General instructions °· Do not use any products that have nicotine or tobacco in them, such as cigarettes and e-cigarettes. If you need help quitting, ask your doctor. °· Use an air purifier to keep the air in your house free of smells. °· Get lots of fresh air. °· Try to avoid smells that make you feel sick. °· Try: °? Wearing a bracelet that is used for seasickness (acupressure wristband). °? Going to a doctor who puts thin needles into certain body points (acupuncture) to improve how you feel. °Contact a doctor if: °· You need medicine to feel better. °· You feel dizzy or light-headed. °· You are losing weight. °Get help right away if: °· You feel very sick to your  stomach and cannot stop throwing up. °· You pass out (faint). °· You have very bad pain in your belly. °Summary °· Morning sickness is when you feel sick to your stomach (nauseous) during pregnancy. °· You may feel sick in the morning, but you can feel this way at any time of day. °· Making some changes to what you eat may help your symptoms go away. °This information is not intended to replace advice given to you by your health care provider. Make sure you discuss any questions you have with your health care provider. °Document Revised: 05/07/2017 Document Reviewed: 06/25/2016 °Elsevier Patient Education © 2020 [employer] ° °

## 2019-09-02 NOTE — MAU Provider Note (Signed)
Patient Vicki Harper is a 24 y.o. G1P0 at [redacted]w[redacted]d here with complaints of nausea and vomiting that started this morning. She denies vaginal bleeding, vaginal discharge, dysuria, fever, SOB (although endorsed that she felt SOB earlier today), abdominal pain.   She states that she was taking diclegis 2-3 times per day and phenergan at night, however she was still vomiting. Came to MAU on 3/24 and was sent home with Reglan and pepcid.  History     CSN: [id number]  Arrival date and time: 09/02/19 1514   First Provider Initiated Contact with Patient 09/02/19 1732      Chief Complaint  Patient presents with  . Dizziness  . Emesis   Dizziness This is a new problem. The current episode started yesterday. The problem occurs constantly. The problem has been resolved. Associated symptoms include vomiting. Pertinent negatives include no abdominal pain, fever or urinary symptoms.  Emesis  Associated symptoms include dizziness. Pertinent negatives include no abdominal pain or fever.    OB History    Gravida  1   Para      Term      Preterm      AB      Living        SAB      TAB      Ectopic      Multiple      Live Births              Past Medical History:  Diagnosis Date  . Asthma   . Back pain   . Headache     Past Surgical History:  Procedure Laterality Date  . NO PAST SURGERIES      No family history on file.  Social History   Tobacco Use  . Smoking status: Never Smoker  . Smokeless tobacco: Never Used  Substance Use Topics  . Alcohol use: No    Alcohol/week: 0.0 standard drinks  . Drug use: No    Allergies: No Known Allergies  Medications Prior to Admission  Medication Sig Dispense Refill Last Dose  . albuterol (PROVENTIL HFA;VENTOLIN HFA) 108 (90 BASE) MCG/ACT inhaler Inhale 1-2 puffs into the lungs every 6 (six) hours as needed for wheezing or shortness of breath. 1 Inhaler 0 09/02/2019 at Unknown time  . Doxylamine-Pyridoxine (DICLEGIS) 10-10 MG  TBEC Initial: Two tablets at bedtime on day 1 and 2; if symptoms persist, take 1 tablet in morning and 2 tablets at bedtime on day 3; if symptoms persist, may increase to 1 tablet in morning, 1 tablet mid-afternoon, and 2 tablets at bedtime on day 4 (maximum: doxylamine 40 mg/pyridoxine 40 mg (4 tablets) per day) 30 tablet 0 09/02/2019 at Unknown time  . famotidine (PEPCID) 20 MG tablet Take 1 tablet (20 mg total) by mouth 2 (two) times daily. 30 tablet 0 09/02/2019 at Unknown time  . ondansetron (ZOFRAN ODT) 4 MG disintegrating tablet Take 1 tablet (4 mg total) by mouth every 6 (six) hours as needed for nausea. 20 tablet 0 09/02/2019 at Unknown time  . promethazine (PHENERGAN) 25 MG tablet Take 0.5-1 tablets (12.5-25 mg total) by mouth every 6 (six) hours as needed for nausea or vomiting. May use vaginally if needed 30 tablet 1 09/02/2019 at Unknown time  . albuterol (PROVENTIL) (2.5 MG/3ML) 0.083% nebulizer solution Take 2.5 mg by nebulization every 6 (six) hours as needed for wheezing or shortness of breath.     . metoCLOPramide (REGLAN) 10 MG tablet Take 1 tablet (10 mg total)  by mouth every 6 (six) hours as needed for nausea. 30 tablet 0   . scopolamine (TRANSDERM-SCOP) 1 MG/3DAYS Place 1 patch (1.5 mg total) onto the skin every 3 (three) days. 10 patch 1     Review of Systems  Constitutional: Negative for fever.  Gastrointestinal: Positive for vomiting. Negative for abdominal pain.  Genitourinary: Negative for vaginal bleeding and vaginal discharge.  Musculoskeletal: Negative.   Neurological: Positive for dizziness.  Hematological: Negative.   Psychiatric/Behavioral: Negative.    Physical Exam   Blood pressure 115/60, pulse 81, temperature 98.3 F (36.8 C), temperature source Oral, resp. rate 16, height 5\' 6"  (1.676 m), weight 65 kg, last menstrual period 07/05/2019, SpO2 100 %.  Physical Exam  Constitutional: She is oriented to person, place, and time. She appears well-developed.  HENT:   Head: Normocephalic.  GI: Soft.  Musculoskeletal:        General: Normal range of motion.     Cervical back: Normal range of motion.  Neurological: She is alert and oriented to person, place, and time.  Skin: Skin is warm and dry.    MAU Course  Procedures  MDM -will try Zofran ODT ; will try PO challenge. Patient tolerated PO crackers and soda and feels much better.  -CBC: normal, no signs of infection.  -CMP: normal, no signs of electrolyte imbalance.   Assessment and Plan   1. Morning sickness    2. Patient stable for discharge with recommendation to add Zofran to regimen. Return to MAU if she cannot keep down liquids.   3. Keep follow up appt for NOB on 09-12-2019.   Mervyn Skeeters Ajahnae Rathgeber 09/02/2019, 6:03 PM

## 2019-09-02 NOTE — MAU Note (Signed)
Vicki Harper is a 24 y.o. at [redacted]w[redacted]d here in MAU reporting: started vomiting foamy yellow liquid this morning. Has vomited 3-4 times today. Is dizzy. Tried reglan yesterday and it worked yesterday but is not working today. Had some "light pink, chunky" discharge.  Onset of complaint: ongoing  Pain score: 0/10  Vitals:   09/02/19 1559  BP: 115/60  Pulse: 81  Resp: 16  Temp: 98.3 F (36.8 C)  SpO2: 100%     Lab orders placed from triage: UA

## 2019-09-08 ENCOUNTER — Other Ambulatory Visit: Payer: Self-pay

## 2019-09-08 ENCOUNTER — Inpatient Hospital Stay (HOSPITAL_COMMUNITY)
Admission: AD | Admit: 2019-09-08 | Discharge: 2019-09-09 | Disposition: A | Discharge status: Home / Self Care | Payer: Medicaid Other | Attending: Obstetrics & Gynecology | Admitting: Obstetrics & Gynecology

## 2019-09-08 DIAGNOSIS — K117 Disturbances of salivary secretion: Secondary | ICD-10-CM

## 2019-09-08 DIAGNOSIS — E860 Dehydration: Secondary | ICD-10-CM

## 2019-09-08 DIAGNOSIS — O219 Vomiting of pregnancy, unspecified: Secondary | ICD-10-CM | POA: Diagnosis not present

## 2019-09-08 DIAGNOSIS — O211 Hyperemesis gravidarum with metabolic disturbance: Secondary | ICD-10-CM | POA: Diagnosis not present

## 2019-09-08 DIAGNOSIS — Z3A10 10 weeks gestation of pregnancy: Secondary | ICD-10-CM | POA: Insufficient documentation

## 2019-09-08 DIAGNOSIS — O210 Mild hyperemesis gravidarum: Secondary | ICD-10-CM | POA: Diagnosis present

## 2019-09-08 LAB — URINALYSIS, ROUTINE W REFLEX MICROSCOPIC
Bilirubin Urine: NEGATIVE
Glucose, UA: NEGATIVE mg/dL
Hgb urine dipstick: NEGATIVE
Ketones, ur: 80 mg/dL — AB
Leukocytes,Ua: NEGATIVE
Nitrite: NEGATIVE
Protein, ur: 100 mg/dL — AB
Specific Gravity, Urine: 1.027 (ref 1.005–1.030)
pH: 8 (ref 5.0–8.0)

## 2019-09-08 MED ORDER — PROMETHAZINE HCL 25 MG/ML IJ SOLN
12.5000 mg | Freq: Once | INTRAMUSCULAR | Status: AC
  Administered 2019-09-08: 12.5 mg via INTRAVENOUS
  Filled 2019-09-08: qty 1

## 2019-09-08 MED ORDER — GLYCOPYRROLATE 0.2 MG/ML IJ SOLN
0.1000 mg | Freq: Once | INTRAMUSCULAR | Status: AC
  Administered 2019-09-08: 0.1 mg via INTRAVENOUS
  Filled 2019-09-08: qty 1

## 2019-09-08 MED ORDER — LACTATED RINGERS IV BOLUS
1000.0000 mL | Freq: Once | INTRAVENOUS | Status: AC
  Administered 2019-09-08: 1000 mL via INTRAVENOUS

## 2019-09-08 MED ORDER — ONDANSETRON HCL 4 MG/2ML IJ SOLN
4.0000 mg | Freq: Once | INTRAMUSCULAR | Status: AC
  Administered 2019-09-08: 4 mg via INTRAVENOUS
  Filled 2019-09-08: qty 2

## 2019-09-08 MED ORDER — SCOPOLAMINE 1 MG/3DAYS TD PT72
1.0000 | MEDICATED_PATCH | TRANSDERMAL | Status: DC
  Administered 2019-09-08: 1.5 mg via TRANSDERMAL
  Filled 2019-09-08: qty 1

## 2019-09-08 MED ORDER — M.V.I. ADULT IV INJ
Freq: Once | INTRAVENOUS | Status: AC
  Filled 2019-09-08: qty 10

## 2019-09-08 MED ORDER — FAMOTIDINE IN NACL 20-0.9 MG/50ML-% IV SOLN
20.0000 mg | Freq: Once | INTRAVENOUS | Status: AC
  Administered 2019-09-08: 20 mg via INTRAVENOUS
  Filled 2019-09-08: qty 50

## 2019-09-08 NOTE — MAU Note (Signed)
Took Diclegis and Zofran this morning -it was working yesterday, but then vomited this morning after meds and vomit was bile colored.  Tried to eat bread, it came back up.  Then has vomited 7 times since then, has been brown color.  Excessive spitting also. No vaginal bleeding. No abd pain.  First OB appt is April 6th with Carmelia Roller CNM at [place] location.

## 2019-09-09 DIAGNOSIS — O219 Vomiting of pregnancy, unspecified: Secondary | ICD-10-CM

## 2019-09-09 MED ORDER — METOCLOPRAMIDE HCL 10 MG PO TABS: 10.0000 mg | ORAL_TABLET | Freq: Three times a day (TID) | ORAL | 1 refills | Status: DC | PRN

## 2019-09-09 MED ORDER — GLYCOPYRROLATE 2 MG PO TABS: 2.0000 mg | ORAL_TABLET | Freq: Three times a day (TID) | ORAL | 3 refills | Status: DC | PRN

## 2019-09-09 MED ORDER — SCOPOLAMINE 1 MG/3DAYS TD PT72: 1.0000 | MEDICATED_PATCH | TRANSDERMAL | 1 refills | Status: DC

## 2019-09-09 MED ORDER — PROMETHAZINE HCL 12.5 MG PO TABS: 12.5000 mg | ORAL_TABLET | Freq: Every evening | ORAL | 0 refills | Status: DC | PRN

## 2019-09-09 MED ORDER — PANTOPRAZOLE SODIUM 20 MG PO TBEC: 20.0000 mg | DELAYED_RELEASE_TABLET | Freq: Every day | ORAL | 2 refills | Status: DC

## 2019-09-12 ENCOUNTER — Ambulatory Visit (INDEPENDENT_AMBULATORY_CARE_PROVIDER_SITE_OTHER): Payer: Medicaid Other | Admitting: Advanced Practice Midwife

## 2019-09-12 ENCOUNTER — Other Ambulatory Visit: Payer: Self-pay

## 2019-09-12 ENCOUNTER — Encounter: Payer: Self-pay | Admitting: Advanced Practice Midwife

## 2019-09-12 ENCOUNTER — Other Ambulatory Visit (HOSPITAL_COMMUNITY)
Admission: RE | Admit: 2019-09-12 | Discharge: 2019-09-12 | Disposition: A | Discharge status: Home / Self Care | Payer: Medicaid Other | Source: Ambulatory Visit | Attending: Advanced Practice Midwife | Admitting: Advanced Practice Midwife

## 2019-09-12 VITALS — BP 97/61 | HR 71 | Wt 139.1 lb

## 2019-09-12 DIAGNOSIS — Z3400 Encounter for supervision of normal first pregnancy, unspecified trimester: Secondary | ICD-10-CM | POA: Insufficient documentation

## 2019-09-12 DIAGNOSIS — Z3401 Encounter for supervision of normal first pregnancy, first trimester: Secondary | ICD-10-CM

## 2019-09-12 DIAGNOSIS — Z3687 Encounter for antenatal screening for uncertain dates: Secondary | ICD-10-CM | POA: Diagnosis not present

## 2019-09-12 DIAGNOSIS — Z3A09 9 weeks gestation of pregnancy: Secondary | ICD-10-CM | POA: Diagnosis not present

## 2019-09-12 DIAGNOSIS — O219 Vomiting of pregnancy, unspecified: Secondary | ICD-10-CM

## 2019-09-12 NOTE — Patient Instructions (Signed)
First Trimester of Pregnancy The first trimester of pregnancy is from week 1 until the end of week 13 (months 1 through 3). A week after a sperm fertilizes an egg, the egg will implant on the wall of the uterus. This embryo will begin to develop into a baby. Genes from you and your partner will form the baby. The female genes will determine whether the baby will be a boy or a girl. At 6-8 weeks, the eyes and face will be formed, and the heartbeat can be seen on ultrasound. At the end of 12 weeks, all the baby's organs will be formed. Now that you are pregnant, you will want to do everything you can to have a healthy baby. Two of the most important things are to get good prenatal care and to follow your health care provider's instructions. Prenatal care is all the medical care you receive before the baby's birth. This care will help prevent, find, and treat any problems during the pregnancy and childbirth. Body changes during your first trimester Your body goes through many changes during pregnancy. The changes vary from woman to woman.  You may gain or lose a couple of pounds at first.  You may feel sick to your stomach (nauseous) and you may throw up (vomit). If the vomiting is uncontrollable, call your health care provider.  You may tire easily.  You may develop headaches that can be relieved by medicines. All medicines should be approved by your health care provider.  You may urinate more often. Painful urination may mean you have a bladder infection.  You may develop heartburn as a result of your pregnancy.  You may develop constipation because certain hormones are causing the muscles that push stool through your intestines to slow down.  You may develop hemorrhoids or swollen veins (varicose veins).  Your breasts may begin to grow larger and become tender. Your nipples may stick out more, and the tissue that surrounds them (areola) may become darker.  Your gums may bleed and may be  sensitive to brushing and flossing.  Dark spots or blotches (chloasma, mask of pregnancy) may develop on your face. This will likely fade after the baby is born.  Your menstrual periods will stop.  You may have a loss of appetite.  You may develop cravings for certain kinds of food.  You may have changes in your emotions from day to day, such as being excited to be pregnant or being concerned that something may go wrong with the pregnancy and baby.  You may have more vivid and strange dreams.  You may have changes in your hair. These can include thickening of your hair, rapid growth, and changes in texture. Some women also have hair loss during or after pregnancy, or hair that feels dry or thin. Your hair will most likely return to normal after your baby is born. What to expect at prenatal visits During a routine prenatal visit:  You will be weighed to make sure you and the baby are growing normally.  Your blood pressure will be taken.  Your abdomen will be measured to track your baby's growth.  The fetal heartbeat will be listened to between weeks 10 and 14 of your pregnancy.  Test results from any previous visits will be discussed. Your health care provider may ask you:  How you are feeling.  If you are feeling the baby move.  If you have had any abnormal symptoms, such as leaking fluid, bleeding, severe headaches, or abdominal   cramping.  If you are using any tobacco products, including cigarettes, chewing tobacco, and electronic cigarettes.  If you have any questions. Other tests that may be performed during your first trimester include:  Blood tests to find your blood type and to check for the presence of any previous infections. The tests will also be used to check for low iron levels (anemia) and protein on red blood cells (Rh antibodies). Depending on your risk factors, or if you previously had diabetes during pregnancy, you may have tests to check for high blood sugar  that affects pregnant women (gestational diabetes).  Urine tests to check for infections, diabetes, or protein in the urine.  An ultrasound to confirm the proper growth and development of the baby.  Fetal screens for spinal cord problems (spina bifida) and Down syndrome.  HIV (human immunodeficiency virus) testing. Routine prenatal testing includes screening for HIV, unless you choose not to have this test.  You may need other tests to make sure you and the baby are doing well. Follow these instructions at home: Medicines  Follow your health care provider's instructions regarding medicine use. Specific medicines may be either safe or unsafe to take during pregnancy.  Take a prenatal vitamin that contains at least 600 micrograms (mcg) of folic acid.  If you develop constipation, try taking a stool softener if your health care provider approves. Eating and drinking   Eat a balanced diet that includes fresh fruits and vegetables, whole grains, good sources of protein such as meat, eggs, or tofu, and low-fat dairy. Your health care provider will help you determine the amount of weight gain that is right for you.  Avoid raw meat and uncooked cheese. These carry germs that can cause birth defects in the baby.  Eating four or five small meals rather than three large meals a day may help relieve nausea and vomiting. If you start to feel nauseous, eating a few soda crackers can be helpful. Drinking liquids between meals, instead of during meals, also seems to help ease nausea and vomiting.  Limit foods that are high in fat and processed sugars, such as fried and sweet foods.  To prevent constipation: ? Eat foods that are high in fiber, such as fresh fruits and vegetables, whole grains, and beans. ? Drink enough fluid to keep your urine clear or pale yellow. Activity  Exercise only as directed by your health care provider. Most women can continue their usual exercise routine during  pregnancy. Try to exercise for 30 minutes at least 5 days a week. Exercising will help you: ? Control your weight. ? Stay in shape. ? Be prepared for labor and delivery.  Experiencing pain or cramping in the lower abdomen or lower back is a good sign that you should stop exercising. Check with your health care provider before continuing with normal exercises.  Try to avoid standing for long periods of time. Move your legs often if you must stand in one place for a long time.  Avoid heavy lifting.  Wear low-heeled shoes and practice good posture.  You may continue to have sex unless your health care provider tells you not to. Relieving pain and discomfort  Wear a good support bra to relieve breast tenderness.  Take warm sitz baths to soothe any pain or discomfort caused by hemorrhoids. Use hemorrhoid cream if your health care provider approves.  Rest with your legs elevated if you have leg cramps or low back pain.  If you develop varicose veins in   your legs, wear support hose. Elevate your feet for 15 minutes, 3-4 times a day. Limit salt in your diet. Prenatal care  Schedule your prenatal visits by the twelfth week of pregnancy. They are usually scheduled monthly at first, then more often in the last 2 months before delivery.  Write down your questions. Take them to your prenatal visits.  Keep all your prenatal visits as told by your health care provider. This is important. Safety  Wear your seat belt at all times when driving.  Make a list of emergency phone numbers, including numbers for family, friends, the hospital, and police and fire departments. General instructions  Ask your health care provider for a referral to a local prenatal education class. Begin classes no later than the beginning of month 6 of your pregnancy.  Ask for help if you have counseling or nutritional needs during pregnancy. Your health care provider can offer advice or refer you to specialists for help  with various needs.  Do not use hot tubs, steam rooms, or saunas.  Do not douche or use tampons or scented sanitary pads.  Do not cross your legs for long periods of time.  Avoid cat litter boxes and soil used by cats. These carry germs that can cause birth defects in the baby and possibly loss of the fetus by miscarriage or stillbirth.  Avoid all smoking, herbs, alcohol, and medicines not prescribed by your health care provider. Chemicals in these products affect the formation and growth of the baby.  Do not use any products that contain nicotine or tobacco, such as cigarettes and e-cigarettes. If you need help quitting, ask your health care provider. You may receive counseling support and other resources to help you quit.  Schedule a dentist appointment. At home, brush your teeth with a soft toothbrush and be gentle when you floss. Contact a health care provider if:  You have dizziness.  You have mild pelvic cramps, pelvic pressure, or nagging pain in the abdominal area.  You have persistent nausea, vomiting, or diarrhea.  You have a bad smelling vaginal discharge.  You have pain when you urinate.  You notice increased swelling in your face, hands, legs, or ankles.  You are exposed to fifth disease or chickenpox.  You are exposed to German measles (rubella) and have never had it. Get help right away if:  You have a fever.  You are leaking fluid from your vagina.  You have spotting or bleeding from your vagina.  You have severe abdominal cramping or pain.  You have rapid weight gain or loss.  You vomit blood or material that looks like coffee grounds.  You develop a severe headache.  You have shortness of breath.  You have any kind of trauma, such as from a fall or a car accident. Summary  The first trimester of pregnancy is from week 1 until the end of week 13 (months 1 through 3).  Your body goes through many changes during pregnancy. The changes vary from  woman to woman.  You will have routine prenatal visits. During those visits, your health care provider will examine you, discuss any test results you may have, and talk with you about how you are feeling. This information is not intended to replace advice given to you by your health care provider. Make sure you discuss any questions you have with your health care provider. Document Revised: 05/07/2017 Document Reviewed: 05/06/2016 Elsevier Patient Education  2020 [employer]  

## 2019-09-12 NOTE — Progress Notes (Signed)
  Subjective:    Vicki Harper is a G1P0 [redacted]w[redacted]d being seen today for her first obstetrical visit.  Her obstetrical history is significant for primigravida. Patient does intend to breast feed. Pregnancy history fully reviewed.  Patient reports nausea and vomiting have improved.  Vitals:   09/12/19 0931  BP: 97/61  Pulse: 71  Weight: 139 lb 1.9 oz (63.1 kg)    HISTORY: OB History  Gravida Para Term Preterm AB Living  1            SAB TAB Ectopic Multiple Live Births               # Outcome Date GA Lbr Len/2nd Weight Sex Delivery Anes PTL Lv  1 Current            Past Medical History:  Diagnosis Date  . Asthma   . Back pain   . Headache    Past Surgical History:  Procedure Laterality Date  . NO PAST SURGERIES     History reviewed. No pertinent family history.   Exam    Uterus:  Fundal Height: 10 cm  Pelvic Exam:    Perineum: Normal Perineum   Vulva: Bartholin's, Urethra, Skene's normal   Vagina:  normal mucosa   pH:    Cervix: no cervical motion tenderness   Adnexa: normal adnexa and no mass, fullness, tenderness   Bony Pelvis: gynecoid  System: Breast:  normal appearance, no masses or tenderness   Skin: normal coloration and turgor, no rashes    Neurologic: oriented, grossly non-focal   Extremities: normal strength, tone, and muscle mass   HEENT neck supple with midline trachea   Mouth/Teeth mucous membranes moist, pharynx normal without lesions   Neck supple   Cardiovascular: regular rate and rhythm   Respiratory:  appears well, vitals normal, no respiratory distress, acyanotic, normal RR, ear and throat exam is normal, neck free of mass or lymphadenopathy, chest clear, no wheezing, crepitations, rhonchi, normal symmetric air entry   Abdomen: soft, non-tender; bowel sounds normal; no masses,  no organomegaly   Urinary: urethral meatus normal      Assessment:    Pregnancy: G1P0 Patient Active Problem List   Diagnosis Date Noted  . Supervision of normal  first pregnancy, antepartum 09/12/2019  . Chronic pain of left knee 11/23/2014        Plan:     Welcomed to practice Routines reviewed  Follow up in 6 weeks. 50% of 30 min visit spent on counseling and coordination of care.   Initial labs drawn. Continue prenatal vitamins. Discussed and offered genetic screening options, including Quad screen/AFP, NIPS testing, and option to decline testing. Benefits/risks/alternatives reviewed. Pt aware that anatomy US is form of genetic screening with lower accuracy in detecting trisomies than blood work.  Pt chooses genetic screening today. NIPS: ordered. Ultrasound discussed; fetal anatomic survey: ordered. Problem list reviewed and updated. The nature of [place] with multiple MDs and other Advanced Practice Providers was explained to patient; also emphasized that residents, students are part of our team. Routine obstetric precautions reviewed.    Hansel Feinstein 09/12/2019

## 2019-09-12 NOTE — Progress Notes (Signed)
DATING AND VIABILITY SONOGRAM   Nariyah Philogene is a 24 y.o. year old G1P0 with LMP Patient's last menstrual period was 07/05/2019. which would correlate to  [redacted]w[redacted]d weeks gestation.  She has regular menstrual cycles.   She is here today for a confirmatory initial sonogram.    GESTATION: SINGLETON  FETAL ACTIVITY:          Heart rate       164          The fetus is active.     GESTATIONAL AGE AND  BIOMETRICS:  Gestational criteria: Estimated Date of Delivery: 04/10/20 by LMP now at [redacted]w[redacted]d  Previous Scans:0      CROWN RUMP LENGTH           2.57 cm         9-2 weeks                                                                               AVERAGE EGA(BY THIS SCAN):  9-2 weeks  WORKING EDD( LMP ):  04-10-2020     TECHNICIAN COMMENTS:    Kathrene Alu 09/12/2019 10:04 AM

## 2019-09-13 ENCOUNTER — Telehealth: Payer: Self-pay

## 2019-09-13 ENCOUNTER — Inpatient Hospital Stay (HOSPITAL_COMMUNITY)
Admission: AD | Admit: 2019-09-13 | Discharge: 2019-09-14 | Disposition: A | Discharge status: Home / Self Care | Payer: Medicaid Other | Attending: Family Medicine | Admitting: Family Medicine

## 2019-09-13 ENCOUNTER — Other Ambulatory Visit: Payer: Self-pay

## 2019-09-13 ENCOUNTER — Encounter (HOSPITAL_COMMUNITY): Payer: Self-pay | Admitting: Family Medicine

## 2019-09-13 DIAGNOSIS — J45909 Unspecified asthma, uncomplicated: Secondary | ICD-10-CM | POA: Diagnosis not present

## 2019-09-13 DIAGNOSIS — O99511 Diseases of the respiratory system complicating pregnancy, first trimester: Secondary | ICD-10-CM | POA: Insufficient documentation

## 2019-09-13 DIAGNOSIS — Z3A10 10 weeks gestation of pregnancy: Secondary | ICD-10-CM | POA: Diagnosis not present

## 2019-09-13 DIAGNOSIS — Z79899 Other long term (current) drug therapy: Secondary | ICD-10-CM | POA: Diagnosis not present

## 2019-09-13 DIAGNOSIS — Z3400 Encounter for supervision of normal first pregnancy, unspecified trimester: Secondary | ICD-10-CM

## 2019-09-13 DIAGNOSIS — O219 Vomiting of pregnancy, unspecified: Secondary | ICD-10-CM | POA: Insufficient documentation

## 2019-09-13 DIAGNOSIS — O99611 Diseases of the digestive system complicating pregnancy, first trimester: Secondary | ICD-10-CM | POA: Diagnosis not present

## 2019-09-13 DIAGNOSIS — O210 Mild hyperemesis gravidarum: Secondary | ICD-10-CM | POA: Diagnosis present

## 2019-09-13 LAB — URINALYSIS, ROUTINE W REFLEX MICROSCOPIC
Bilirubin Urine: NEGATIVE
Glucose, UA: NEGATIVE mg/dL
Hgb urine dipstick: NEGATIVE
Ketones, ur: 80 mg/dL — AB
Leukocytes,Ua: NEGATIVE
Nitrite: NEGATIVE
Protein, ur: 30 mg/dL — AB
Specific Gravity, Urine: 1.028 (ref 1.005–1.030)
pH: 6 (ref 5.0–8.0)

## 2019-09-13 MED ORDER — PROMETHAZINE HCL 25 MG/ML IJ SOLN
12.5000 mg | Freq: Once | INTRAMUSCULAR | Status: AC
  Administered 2019-09-13: 12.5 mg via INTRAVENOUS
  Filled 2019-09-13: qty 1

## 2019-09-13 MED ORDER — SODIUM CHLORIDE 0.9 % IV SOLN
8.0000 mg | Freq: Once | INTRAVENOUS | Status: AC
  Administered 2019-09-13: 8 mg via INTRAVENOUS
  Filled 2019-09-13: qty 4

## 2019-09-13 MED ORDER — LACTATED RINGERS IV BOLUS
1000.0000 mL | Freq: Once | INTRAVENOUS | Status: AC
  Administered 2019-09-13: 1000 mL via INTRAVENOUS

## 2019-09-13 MED ORDER — FAMOTIDINE IN NACL 20-0.9 MG/50ML-% IV SOLN
20.0000 mg | Freq: Once | INTRAVENOUS | Status: AC
  Administered 2019-09-13: 20 mg via INTRAVENOUS
  Filled 2019-09-13: qty 50

## 2019-09-13 NOTE — MAU Note (Signed)
Vomiting all day. 12times so far. Have taken my nausea meds and they come back up. No vag bleeding. Having some lower and mid abd pain that feels like gas. No diarrhea

## 2019-09-13 NOTE — MAU Provider Note (Addendum)
History     CSN: [id number]  Arrival date and time: 09/13/19 [id number]   First Provider Initiated Contact with Patient 09/13/19 2019      Chief Complaint  Patient presents with  . Emesis During Pregnancy   Vicki Harper is a 24 y.o. G1P0 at [redacted]w[redacted]d who receives care at [facility].  She presents today for Emesis During Pregnancy.  She states the vomiting started this morning about 0930.  She reports she had not had anything to eat prior to the vomiting and did not eat last night.  She reports she took zofran at 9pm and phenergan 11pm with no issues, but having vomiting when taking zofran and reglan this morning.  She reports she has thrown up about 13x throughout the day. She reports she was able to drink water at 430pm, but nothing since.  She endorses current nausea and has had vomiting 3x since arrival.  Patient denies vaginal concerns and reports some occasional abdominal pain that she contributes to gas.   OB History    Gravida  1   Para      Term      Preterm      AB      Living        SAB      TAB      Ectopic      Multiple      Live Births              Past Medical History:  Diagnosis Date  . Asthma   . Back pain   . Headache     Past Surgical History:  Procedure Laterality Date  . NO PAST SURGERIES      No family history on file.  Social History   Tobacco Use  . Smoking status: Never Smoker  . Smokeless tobacco: Never Used  Substance Use Topics  . Alcohol use: No    Alcohol/week: 0.0 standard drinks  . Drug use: No    Allergies: No Known Allergies  Medications Prior to Admission  Medication Sig Dispense Refill Last Dose  . metoCLOPramide (REGLAN) 10 MG tablet Take 1 tablet (10 mg total) by mouth 3 (three) times daily with meals as needed for nausea. 30 tablet 1 09/13/2019 at Unknown time  . ondansetron (ZOFRAN-ODT) 4 MG disintegrating tablet Take 4 mg by mouth every 6 (six) hours as needed.   09/13/2019 at Unknown time  . albuterol (PROVENTIL  HFA;VENTOLIN HFA) 108 (90 BASE) MCG/ACT inhaler Inhale 1-2 puffs into the lungs every 6 (six) hours as needed for wheezing or shortness of breath. 1 Inhaler 0   . albuterol (PROVENTIL) (2.5 MG/3ML) 0.083% nebulizer solution Take 2.5 mg by nebulization every 6 (six) hours as needed for wheezing or shortness of breath.     Marland Kitchen albuterol (VENTOLIN) (5 MG/ML) 0.5% NEBU Inhale into the lungs.     Marland Kitchen glycopyrrolate (ROBINUL) 2 MG tablet Take 1 tablet (2 mg total) by mouth 3 (three) times daily as needed. (Patient not taking: Reported on 09/12/2019) 30 tablet 3   . ondansetron (ZOFRAN) 4 MG tablet Take 4 mg by mouth every 8 (eight) hours as needed for nausea or vomiting.     . pantoprazole (PROTONIX) 20 MG tablet Take 1 tablet (20 mg total) by mouth daily. 30 tablet 2   . promethazine (PHENERGAN) 12.5 MG tablet Take 1-2 tablets (12.5-25 mg total) by mouth at bedtime as needed for nausea or vomiting. 30 tablet 0   . scopolamine (TRANSDERM-SCOP) 1  MG/3DAYS Place 1 patch (1.5 mg total) onto the skin every 3 (three) days. 10 patch 1     Review of Systems  Constitutional: Negative for chills and fever.  Respiratory: Positive for chest tightness. Negative for cough and shortness of breath.   Cardiovascular: Negative for chest pain.  Gastrointestinal: Positive for abdominal pain (Gas pain), nausea and vomiting. Negative for constipation and diarrhea.  Genitourinary: Negative for difficulty urinating, dysuria, vaginal bleeding and vaginal discharge.  Neurological: Negative for dizziness, light-headedness and headaches.   Physical Exam   Blood pressure 119/68, pulse 69, temperature 98.7 F (37.1 C), resp. rate 16, height 5\' 6"  (1.676 m), weight 60.8 kg, last menstrual period 07/05/2019.  Physical Exam  Constitutional: She is oriented to person, place, and time. She appears well-developed and well-nourished.  Patient resting in bed w/ lights off upon provider arrival.   HENT:  Head: Normocephalic and  atraumatic.  Eyes: Conjunctivae are normal.  Cardiovascular: Normal rate, regular rhythm and normal heart sounds.  Respiratory: Effort normal and breath sounds normal.  GI: Soft. There is no abdominal tenderness.  Musculoskeletal:        General: Normal range of motion.     Cervical back: Normal range of motion.  Neurological: She is alert and oriented to person, place, and time.  Skin: Skin is warm and dry.  Psychiatric: She has a normal mood and affect. Her behavior is normal.   Doppler: 161 MAU Course  Procedures Results for orders placed or performed during the hospital encounter of 09/13/19 (from the past 24 hour(s))  Urinalysis, Routine w reflex microscopic     Status: Abnormal   Collection Time: 09/13/19  6:41 PM  Result Value Ref Range   Color, Urine YELLOW YELLOW   APPearance CLEAR CLEAR   Specific Gravity, Urine 1.028 1.005 - 1.030   pH 6.0 5.0 - 8.0   Glucose, UA NEGATIVE NEGATIVE mg/dL   Hgb urine dipstick NEGATIVE NEGATIVE   Bilirubin Urine NEGATIVE NEGATIVE   Ketones, ur 80 (A) NEGATIVE mg/dL   Protein, ur 30 (A) NEGATIVE mg/dL   Nitrite NEGATIVE NEGATIVE   Leukocytes,Ua NEGATIVE NEGATIVE   RBC / HPF 0-5 0 - 5 RBC/hpf   WBC, UA 0-5 0 - 5 WBC/hpf   Bacteria, UA FEW (A) NONE SEEN   Squamous Epithelial / LPF 0-5 0 - 5   Mucus PRESENT     MDM PE Labs: UA Start IV LR Bolus  Antiemetic PPI Assessment and Plan  24 year old G1P0 SIUP at 10 weeks N/V  -POC reviewed. -Educated on eating habits during pregnancy to include small frequent meals throughout the day.  Discussed how lack of eating last night and this morning, contributed greatly to current n/v. -Informed that chest tightness is likely related to heartburn/GERD associated with vomiting.  -Will start IV and give fluids  -Phenergan and Pepcid IV. -Patient requests and given ice chips. -Will reassess after oral challenge.   [name] 09/13/2019, 8:19 PM   Reassessment (12:23 AM)  -Nurse  reports patient with no improvement in nausea. -Zofran 8mg  ordered. -Report given to [name], MD  [name] MSN, CNM Advanced Practice Provider, [facility]   1:43 AM Patient with improvement in symptoms and passed PO challenge. 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 Refills sent on all anti-emetics. D/c to home in stable condition.  Annice Needy  Dione Plover MD/MPH

## 2019-09-13 NOTE — Telephone Encounter (Signed)
Pt called the nurse after hours line stating she is unable to keep anything down. Pt states she vomited 5 times today. Advised pt to go to [facility] at [facility]. Understanding was voiced.  Raquan Iannone l Sheddrick Lattanzio, CMA

## 2019-09-14 ENCOUNTER — Encounter: Payer: Self-pay | Admitting: Advanced Practice Midwife

## 2019-09-14 DIAGNOSIS — O210 Mild hyperemesis gravidarum: Secondary | ICD-10-CM

## 2019-09-14 DIAGNOSIS — Z3A10 10 weeks gestation of pregnancy: Secondary | ICD-10-CM

## 2019-09-14 DIAGNOSIS — R87612 Low grade squamous intraepithelial lesion on cytologic smear of cervix (LGSIL): Secondary | ICD-10-CM | POA: Insufficient documentation

## 2019-09-14 DIAGNOSIS — O99611 Diseases of the digestive system complicating pregnancy, first trimester: Secondary | ICD-10-CM

## 2019-09-14 LAB — CYTOLOGY - PAP
Chlamydia: NEGATIVE
Comment: NEGATIVE
Comment: NORMAL
Neisseria Gonorrhea: NEGATIVE

## 2019-09-14 LAB — URINE CULTURE, OB REFLEX

## 2019-09-14 LAB — CULTURE, OB URINE

## 2019-09-14 MED ORDER — PROMETHAZINE HCL 12.5 MG PO TABS: 12.5000 mg | ORAL_TABLET | Freq: Every evening | ORAL | 2 refills | Status: DC | PRN

## 2019-09-14 MED ORDER — ONDANSETRON 4 MG PO TBDP: 4.0000 mg | ORAL_TABLET | Freq: Four times a day (QID) | ORAL | 2 refills | Status: DC | PRN

## 2019-09-14 MED ORDER — METOCLOPRAMIDE HCL 10 MG PO TABS: 10.0000 mg | ORAL_TABLET | Freq: Three times a day (TID) | ORAL | 1 refills | Status: DC | PRN

## 2019-09-14 MED ORDER — SCOPOLAMINE 1 MG/3DAYS TD PT72: 1.0000 | MEDICATED_PATCH | TRANSDERMAL | 1 refills | Status: DC

## 2019-09-14 NOTE — MAU Note (Signed)
Patient given water and graham crackers for PO challenge

## 2019-09-14 NOTE — Discharge Instructions (Signed)
Hyperemesis Gravidarum Hyperemesis gravidarum is a severe form of nausea and vomiting that happens during pregnancy. Hyperemesis is worse than morning sickness. It may cause you to have nausea or vomiting all day for many days. It may keep you from eating and drinking enough food and liquids, which can lead to dehydration, malnutrition, and weight loss. Hyperemesis usually occurs during the first half (the first 20 weeks) of pregnancy. It often goes away once a woman is in her second half of pregnancy. However, sometimes hyperemesis continues through an entire pregnancy. What are the causes? The cause of this condition is not known. It may be related to changes in chemicals (hormones) in the body during pregnancy, such as the high level of pregnancy hormone (human chorionic gonadotropin) or the increase in the female sex hormone (estrogen). What are the signs or symptoms? Symptoms of this condition include:  Nausea that does not go away.  Vomiting that does not allow you to keep any food down.  Weight loss.  Body fluid loss (dehydration).  Having no desire to eat, or not liking food that you have previously enjoyed. How is this diagnosed? This condition may be diagnosed based on:  A physical exam.  Your medical history.  Your symptoms.  Blood tests.  Urine tests. How is this treated? This condition is managed by controlling symptoms. This may include:  Following an eating plan. This can help lessen nausea and vomiting.  Taking prescription medicines. An eating plan and medicines are often used together to help control symptoms. If medicines do not help relieve nausea and vomiting, you may need to receive fluids through an IV at the hospital. Follow these instructions at home: Eating and drinking   Avoid the following: ? Drinking fluids with meals. Try not to drink anything during the 30 minutes before and after your meals. ? Drinking more than 1 cup of fluid at a  time. ? Eating foods that trigger your symptoms. These may include spicy foods, coffee, high-fat foods, very sweet foods, and acidic foods. ? Skipping meals. Nausea can be more intense on an empty stomach. If you cannot tolerate food, do not force it. Try sucking on ice chips or other frozen items and make up for missed calories later. ? Lying down within 2 hours after eating. ? Being exposed to environmental triggers. These may include food smells, smoky rooms, closed spaces, rooms with strong smells, warm or humid places, overly loud and noisy rooms, and rooms with motion or flickering lights. Try eating meals in a well-ventilated area that is free of strong smells. ? Quick and sudden changes in your movement. ? Taking iron pills and multivitamins that contain iron. If you take prescription iron pills, do not stop taking them unless your health care provider approves. ? Preparing food. The smell of food can spoil your appetite or trigger nausea.  To help relieve your symptoms: ? Listen to your body. Everyone is different and has different preferences. Find what works best for you. ? Eat and drink slowly. ? Eat 5-6 small meals daily instead of 3 large meals. Eating small meals and snacks can help you avoid an empty stomach. ? In the morning, before getting out of bed, eat a couple of crackers to avoid moving around on an empty stomach. ? Try eating starchy foods as these are usually tolerated well. Examples include cereal, toast, bread, potatoes, pasta, rice, and pretzels. ? Include at least 1 serving of protein with your meals and snacks. Protein options include   lean meats, poultry, seafood, beans, nuts, nut butters, eggs, cheese, and yogurt. ? Try eating a protein-rich snack before bed. Examples of a protein-rick snack include cheese and crackers or a peanut butter sandwich made with 1 slice of whole-wheat bread and 1 tsp (5 g) of peanut butter. ? Eat or suck on things that have ginger in them.  It may help relieve nausea. Add  tsp ground ginger to hot tea or choose ginger tea. ? Try drinking 100% fruit juice or an electrolyte drink. An electrolyte drink contains sodium, potassium, and chloride. ? Drink fluids that are cold, clear, and carbonated or sour. Examples include lemonade, ginger ale, lemon-lime soda, ice water, and sparkling water. ? Brush your teeth or use a mouth rinse after meals. ? Talk with your health care provider about starting a supplement of vitamin B6. General instructions  Take over-the-counter and prescription medicines only as told by your health care provider.  Follow instructions from your health care provider about eating or drinking restrictions.  Continue to take your prenatal vitamins as told by your health care provider. If you are having trouble taking your prenatal vitamins, talk with your health care provider about different options.  Keep all follow-up and pre-birth (prenatal) visits as told by your health care provider. This is important. Contact a health care provider if:  You have pain in your abdomen.  You have a severe headache.  You have vision problems.  You are losing weight.  You feel weak or dizzy. Get help right away if:  You cannot drink fluids without vomiting.  You vomit blood.  You have constant nausea and vomiting.  You are very weak.  You faint.  You have a fever and your symptoms suddenly get worse. Summary  Hyperemesis gravidarum is a severe form of nausea and vomiting that happens during pregnancy.  Making some changes to your eating habits may help relieve nausea and vomiting.  This condition may be managed with medicine.  If medicines do not help relieve nausea and vomiting, you may need to receive fluids through an IV at the hospital. This information is not intended to replace advice given to you by your health care provider. Make sure you discuss any questions you have with your health care  provider. Document Revised: 06/14/2017 Document Reviewed: 01/22/2016 Elsevier Patient Education  2020 [employer]  

## 2019-09-15 LAB — CULTURE, OB URINE: Culture: 10000 — AB

## 2019-09-16 ENCOUNTER — Inpatient Hospital Stay (HOSPITAL_COMMUNITY)
Admission: AD | Admit: 2019-09-16 | Discharge: 2019-09-16 | Disposition: A | Discharge status: Home / Self Care | Payer: Medicaid Other | Attending: Obstetrics and Gynecology | Admitting: Obstetrics and Gynecology

## 2019-09-16 ENCOUNTER — Other Ambulatory Visit: Payer: Self-pay

## 2019-09-16 ENCOUNTER — Encounter (HOSPITAL_COMMUNITY): Payer: Self-pay | Admitting: Obstetrics and Gynecology

## 2019-09-16 DIAGNOSIS — O219 Vomiting of pregnancy, unspecified: Secondary | ICD-10-CM

## 2019-09-16 DIAGNOSIS — Z3A10 10 weeks gestation of pregnancy: Secondary | ICD-10-CM | POA: Diagnosis not present

## 2019-09-16 DIAGNOSIS — O210 Mild hyperemesis gravidarum: Secondary | ICD-10-CM | POA: Diagnosis present

## 2019-09-16 LAB — URINALYSIS, ROUTINE W REFLEX MICROSCOPIC
Bilirubin Urine: NEGATIVE
Glucose, UA: NEGATIVE mg/dL
Hgb urine dipstick: NEGATIVE
Ketones, ur: 80 mg/dL — AB
Leukocytes,Ua: NEGATIVE
Nitrite: NEGATIVE
Protein, ur: 100 mg/dL — AB
Specific Gravity, Urine: 1.028 (ref 1.005–1.030)
pH: 6 (ref 5.0–8.0)

## 2019-09-16 MED ORDER — THIAMINE HCL 100 MG/ML IJ SOLN
Freq: Once | INTRAVENOUS | Status: AC
  Filled 2019-09-16: qty 1000

## 2019-09-16 MED ORDER — SCOPOLAMINE 1 MG/3DAYS TD PT72
1.0000 | MEDICATED_PATCH | Freq: Once | TRANSDERMAL | Status: DC
  Administered 2019-09-16: 1.5 mg via TRANSDERMAL
  Filled 2019-09-16: qty 1

## 2019-09-16 MED ORDER — FAMOTIDINE IN NACL 20-0.9 MG/50ML-% IV SOLN
20.0000 mg | Freq: Once | INTRAVENOUS | Status: AC
  Administered 2019-09-16: 20 mg via INTRAVENOUS
  Filled 2019-09-16: qty 50

## 2019-09-16 MED ORDER — ONDANSETRON HCL 4 MG/2ML IJ SOLN
4.0000 mg | Freq: Once | INTRAMUSCULAR | Status: AC
  Administered 2019-09-16: 4 mg via INTRAVENOUS
  Filled 2019-09-16: qty 2

## 2019-09-16 MED ORDER — LACTATED RINGERS IV BOLUS
1000.0000 mL | Freq: Once | INTRAVENOUS | Status: AC
  Administered 2019-09-16: 1000 mL via INTRAVENOUS

## 2019-09-16 NOTE — Discharge Instructions (Signed)
Hyperemesis Gravidarum Hyperemesis gravidarum is a severe form of nausea and vomiting that happens during pregnancy. Hyperemesis is worse than morning sickness. It may cause you to have nausea or vomiting all day for many days. It may keep you from eating and drinking enough food and liquids, which can lead to dehydration, malnutrition, and weight loss. Hyperemesis usually occurs during the first half (the first 20 weeks) of pregnancy. It often goes away once a woman is in her second half of pregnancy. However, sometimes hyperemesis continues through an entire pregnancy. What are the causes? The cause of this condition is not known. It may be related to changes in chemicals (hormones) in the body during pregnancy, such as the high level of pregnancy hormone (human chorionic gonadotropin) or the increase in the female sex hormone (estrogen). What are the signs or symptoms? Symptoms of this condition include:  Nausea that does not go away.  Vomiting that does not allow you to keep any food down.  Weight loss.  Body fluid loss (dehydration).  Having no desire to eat, or not liking food that you have previously enjoyed. How is this diagnosed? This condition may be diagnosed based on:  A physical exam.  Your medical history.  Your symptoms.  Blood tests.  Urine tests. How is this treated? This condition is managed by controlling symptoms. This may include:  Following an eating plan. This can help lessen nausea and vomiting.  Taking prescription medicines. An eating plan and medicines are often used together to help control symptoms. If medicines do not help relieve nausea and vomiting, you may need to receive fluids through an IV at the hospital. Follow these instructions at home: Eating and drinking   Avoid the following: ? Drinking fluids with meals. Try not to drink anything during the 30 minutes before and after your meals. ? Drinking more than 1 cup of fluid at a  time. ? Eating foods that trigger your symptoms. These may include spicy foods, coffee, high-fat foods, very sweet foods, and acidic foods. ? Skipping meals. Nausea can be more intense on an empty stomach. If you cannot tolerate food, do not force it. Try sucking on ice chips or other frozen items and make up for missed calories later. ? Lying down within 2 hours after eating. ? Being exposed to environmental triggers. These may include food smells, smoky rooms, closed spaces, rooms with strong smells, warm or humid places, overly loud and noisy rooms, and rooms with motion or flickering lights. Try eating meals in a well-ventilated area that is free of strong smells. ? Quick and sudden changes in your movement. ? Taking iron pills and multivitamins that contain iron. If you take prescription iron pills, do not stop taking them unless your health care provider approves. ? Preparing food. The smell of food can spoil your appetite or trigger nausea.  To help relieve your symptoms: ? Listen to your body. Everyone is different and has different preferences. Find what works best for you. ? Eat and drink slowly. ? Eat 5-6 small meals daily instead of 3 large meals. Eating small meals and snacks can help you avoid an empty stomach. ? In the morning, before getting out of bed, eat a couple of crackers to avoid moving around on an empty stomach. ? Try eating starchy foods as these are usually tolerated well. Examples include cereal, toast, bread, potatoes, pasta, rice, and pretzels. ? Include at least 1 serving of protein with your meals and snacks. Protein options include   lean meats, poultry, seafood, beans, nuts, nut butters, eggs, cheese, and yogurt. ? Try eating a protein-rich snack before bed. Examples of a protein-rick snack include cheese and crackers or a peanut butter sandwich made with 1 slice of whole-wheat bread and 1 tsp (5 g) of peanut butter. ? Eat or suck on things that have ginger in them.  It may help relieve nausea. Add  tsp ground ginger to hot tea or choose ginger tea. ? Try drinking 100% fruit juice or an electrolyte drink. An electrolyte drink contains sodium, potassium, and chloride. ? Drink fluids that are cold, clear, and carbonated or sour. Examples include lemonade, ginger ale, lemon-lime soda, ice water, and sparkling water. ? Brush your teeth or use a mouth rinse after meals. ? Talk with your health care provider about starting a supplement of vitamin B6. General instructions  Take over-the-counter and prescription medicines only as told by your health care provider.  Follow instructions from your health care provider about eating or drinking restrictions.  Continue to take your prenatal vitamins as told by your health care provider. If you are having trouble taking your prenatal vitamins, talk with your health care provider about different options.  Keep all follow-up and pre-birth (prenatal) visits as told by your health care provider. This is important. Contact a health care provider if:  You have pain in your abdomen.  You have a severe headache.  You have vision problems.  You are losing weight.  You feel weak or dizzy. Get help right away if:  You cannot drink fluids without vomiting.  You vomit blood.  You have constant nausea and vomiting.  You are very weak.  You faint.  You have a fever and your symptoms suddenly get worse. Summary  Hyperemesis gravidarum is a severe form of nausea and vomiting that happens during pregnancy.  Making some changes to your eating habits may help relieve nausea and vomiting.  This condition may be managed with medicine.  If medicines do not help relieve nausea and vomiting, you may need to receive fluids through an IV at the hospital. This information is not intended to replace advice given to you by your health care provider. Make sure you discuss any questions you have with your health care  provider. Document Revised: 06/14/2017 Document Reviewed: 01/22/2016 Elsevier Patient Education  2020 [employer]  

## 2019-09-16 NOTE — MAU Provider Note (Signed)
Chief Complaint: Nausea and Emesis   First Provider Initiated Contact with Patient 09/16/19 1349     SUBJECTIVE HPI: Vicki Harper is a 24 y.o. G1P0 at [redacted]w[redacted]d who presents to Maternity Admissions reporting nausea & vomiting. This has been ongoing with the pregnancy. Takes pepcid, phenergan, reglan, & zofran at home. Was prescribed scopolamine patches but waiting on prior authorization. Has vomited 8 times today. Has not taken any of her antiemetics today due to vomiting. Was able to keep down some water this morning. Endorses heartburn. Denies abdominal pain, fever, vaginal bleeding, or diarrhea.   Past Medical History:  Diagnosis Date  . Asthma   . Back pain   . Headache    OB History  Gravida Para Term Preterm AB Living  1            SAB TAB Ectopic Multiple Live Births               # Outcome Date GA Lbr Len/2nd Weight Sex Delivery Anes PTL Lv  1 Current            Past Surgical History:  Procedure Laterality Date  . NO PAST SURGERIES     Social History   Socioeconomic History  . Marital status: Single    Spouse name: Not on file  . Number of children: Not on file  . Years of education: Not on file  . Highest education level: Not on file  Occupational History  . Not on file  Tobacco Use  . Smoking status: Never Smoker  . Smokeless tobacco: Never Used  Substance and Sexual Activity  . Alcohol use: No    Alcohol/week: 0.0 standard drinks  . Drug use: No  . Sexual activity: Yes    Birth control/protection: None  Other Topics Concern  . Not on file  Social History Narrative  . Not on file   Social Determinants of Health   Financial Resource Strain:   . Difficulty of Paying Living Expenses:   Food Insecurity:   . Worried About Charity fundraiser in the Last Year:   . Arboriculturist in the Last Year:   Transportation Needs:   . Film/video editor (Medical):   Marland Kitchen Lack of Transportation (Non-Medical):   Physical Activity:   . Days of Exercise per Week:   .  Minutes of Exercise per Session:   Stress:   . Feeling of Stress :   Social Connections:   . Frequency of Communication with Friends and Family:   . Frequency of Social Gatherings with Friends and Family:   . Attends Religious Services:   . Active Member of Clubs or Organizations:   . Attends Archivist Meetings:   Marland Kitchen Marital Status:   Intimate Partner Violence:   . Fear of Current or Ex-Partner:   . Emotionally Abused:   Marland Kitchen Physically Abused:   . Sexually Abused:    History reviewed. No pertinent family history. No current facility-administered medications on file prior to encounter.   Current Outpatient Medications on File Prior to Encounter  Medication Sig Dispense Refill  . albuterol (PROVENTIL HFA;VENTOLIN HFA) 108 (90 BASE) MCG/ACT inhaler Inhale 1-2 puffs into the lungs every 6 (six) hours as needed for wheezing or shortness of breath. 1 Inhaler 0  . albuterol (PROVENTIL) (2.5 MG/3ML) 0.083% nebulizer solution Take 2.5 mg by nebulization every 6 (six) hours as needed for wheezing or shortness of breath.    Marland Kitchen albuterol (VENTOLIN) (5 MG/ML) 0.5% NEBU  Inhale into the lungs.    Marland Kitchen glycopyrrolate (ROBINUL) 2 MG tablet Take 1 tablet (2 mg total) by mouth 3 (three) times daily as needed. (Patient not taking: Reported on 09/12/2019) 30 tablet 3  . metoCLOPramide (REGLAN) 10 MG tablet Take 1 tablet (10 mg total) by mouth 3 (three) times daily with meals as needed for nausea. 30 tablet 1  . ondansetron (ZOFRAN-ODT) 4 MG disintegrating tablet Take 1 tablet (4 mg total) by mouth every 6 (six) hours as needed. 30 tablet 2  . pantoprazole (PROTONIX) 20 MG tablet Take 1 tablet (20 mg total) by mouth daily. 30 tablet 2  . promethazine (PHENERGAN) 12.5 MG tablet Take 1-2 tablets (12.5-25 mg total) by mouth at bedtime as needed for nausea or vomiting. 30 tablet 2  . scopolamine (TRANSDERM-SCOP) 1 MG/3DAYS Place 1 patch (1.5 mg total) onto the skin every 3 (three) days. 10 patch 1   No Known  Allergies  I have reviewed patient's Past Medical Hx, Surgical Hx, Family Hx, Social Hx, medications and allergies.   Review of Systems  Constitutional: Negative.   Gastrointestinal: Positive for nausea and vomiting. Negative for abdominal pain.  Genitourinary: Negative.     OBJECTIVE Patient Vitals for the past 24 hrs:  BP Temp Temp src Pulse Resp SpO2 Height Weight  09/16/19 1126 117/67 98.4 F (36.9 C) Oral 83 16 100 % -- --  09/16/19 1123 -- -- -- -- -- -- 5\' 6"  (1.676 m) 60.8 kg   Constitutional: Well-developed, well-nourished female in no acute distress.  Cardiovascular: normal rate & rhythm, no murmur Respiratory: normal rate and effort. Lung sounds clear throughout GI: Abd soft, non-tender, Pos BS x 4. No guarding or rebound tenderness MS: Extremities nontender, no edema, normal ROM Neurologic: Alert and oriented x 4.      LAB RESULTS Results for orders placed or performed during the hospital encounter of 09/16/19 (from the past 24 hour(s))  Urinalysis, Routine w reflex microscopic     Status: Abnormal   Collection Time: 09/16/19 11:32 AM  Result Value Ref Range   Color, Urine YELLOW YELLOW   APPearance HAZY (A) CLEAR   Specific Gravity, Urine 1.028 1.005 - 1.030   pH 6.0 5.0 - 8.0   Glucose, UA NEGATIVE NEGATIVE mg/dL   Hgb urine dipstick NEGATIVE NEGATIVE   Bilirubin Urine NEGATIVE NEGATIVE   Ketones, ur 80 (A) NEGATIVE mg/dL   Protein, ur 100 (A) NEGATIVE mg/dL   Nitrite NEGATIVE NEGATIVE   Leukocytes,Ua NEGATIVE NEGATIVE   RBC / HPF 0-5 0 - 5 RBC/hpf   WBC, UA 0-5 0 - 5 WBC/hpf   Bacteria, UA RARE (A) NONE SEEN   Squamous Epithelial / LPF 0-5 0 - 5   Mucus PRESENT    Hyaline Casts, UA PRESENT     IMAGING No results found.  MAU COURSE Orders Placed This Encounter  Procedures  . Urinalysis, Routine w reflex microscopic  . Discharge patient   Meds ordered this encounter  Medications  . FOLLOWED BY Linked Order Group   . lactated ringers bolus  1,000 mL   . sodium chloride 0.9 % 1,000 mL with thiamine [id number] mg, folic acid 1 mg, multivitamins adult 10 mL infusion  . famotidine (PEPCID) IVPB 20 mg premix  . ondansetron (ZOFRAN) injection 4 mg  . scopolamine (TRANSDERM-SCOP) 1 MG/3DAYS 1.5 mg    MDM FHT present via doppler  IVF - LR 1 liter follow by banana bag. IV zofran & pepcid given. Scop patch applied.  No vomiting in MAU & able to tolerate PO challenge  ASSESSMENT 1. Nausea and vomiting during pregnancy prior to [redacted] weeks gestation   2. [redacted] weeks gestation of pregnancy     PLAN Discharge home in stable condition. Continue taking at home meds on schedule Keep scop patch on for 3 days Discussed reasons to return to MAU   Allergies as of 09/16/2019   No Known Allergies     Medication List    STOP taking these medications   glycopyrrolate 2 MG tablet Commonly known as: ROBINUL     TAKE these medications   albuterol (5 MG/ML) 0.5% Nebu Commonly known as: VENTOLIN Inhale into the lungs.   albuterol (2.5 MG/3ML) 0.083% nebulizer solution Commonly known as: PROVENTIL Take 2.5 mg by nebulization every 6 (six) hours as needed for wheezing or shortness of breath.   albuterol 108 (90 Base) MCG/ACT inhaler Commonly known as: VENTOLIN HFA Inhale 1-2 puffs into the lungs every 6 (six) hours as needed for wheezing or shortness of breath.   metoCLOPramide 10 MG tablet Commonly known as: REGLAN Take 1 tablet (10 mg total) by mouth 3 (three) times daily with meals as needed for nausea.   ondansetron 4 MG disintegrating tablet Commonly known as: ZOFRAN-ODT Take 1 tablet (4 mg total) by mouth every 6 (six) hours as needed.   pantoprazole 20 MG tablet Commonly known as: PROTONIX Take 1 tablet (20 mg total) by mouth daily.   promethazine 12.5 MG tablet Commonly known as: PHENERGAN Take 1-2 tablets (12.5-25 mg total) by mouth at bedtime as needed for nausea or vomiting.   scopolamine 1 MG/3DAYS Commonly known as:  TRANSDERM-SCOP Place 1 patch (1.5 mg total) onto the skin every 3 (three) days.        Jorje Guild, NP 09/16/2019  5:31 PM

## 2019-09-16 NOTE — MAU Note (Signed)
Vicki Harper is a 24 y.o. at [redacted]w[redacted]d here in MAU reporting: here with nausea and vomiting. States she is unable to keep anything down. Problem is ongoing. Chest feels tight from the acid. Emesis x 8 in the past 24 hours. No bleeding, discharge, or lower abdominal pain.  Onset of complaint: ongoing  Pain score: 0/10  Vitals:   09/16/19 1126  BP: 117/67  Pulse: 83  Resp: 16  Temp: 98.4 F (36.9 C)  SpO2: 100%     Lab orders placed from triage: UA

## 2019-09-16 NOTE — MAU Note (Signed)
Patient tolerating saltines and ice water at this time.

## 2019-09-18 ENCOUNTER — Telehealth: Payer: Self-pay

## 2019-09-18 NOTE — Telephone Encounter (Signed)
Prior Authorization for Scopolamine 1 mg patch has been approved and sent to the pharmacy.  Annastyn Silvey l Vidit Boissonneault, CMA

## 2019-09-19 ENCOUNTER — Encounter (HOSPITAL_COMMUNITY): Payer: Self-pay | Admitting: Family Medicine

## 2019-09-19 ENCOUNTER — Other Ambulatory Visit: Payer: Self-pay

## 2019-09-19 ENCOUNTER — Inpatient Hospital Stay (HOSPITAL_COMMUNITY)
Admission: AD | Admit: 2019-09-19 | Discharge: 2019-09-20 | Disposition: A | Discharge status: Home / Self Care | Payer: Medicaid Other | Attending: Family Medicine | Admitting: Family Medicine

## 2019-09-19 DIAGNOSIS — Z3A10 10 weeks gestation of pregnancy: Secondary | ICD-10-CM | POA: Diagnosis not present

## 2019-09-19 DIAGNOSIS — O210 Mild hyperemesis gravidarum: Secondary | ICD-10-CM | POA: Insufficient documentation

## 2019-09-19 DIAGNOSIS — E860 Dehydration: Secondary | ICD-10-CM

## 2019-09-19 DIAGNOSIS — Z3400 Encounter for supervision of normal first pregnancy, unspecified trimester: Secondary | ICD-10-CM

## 2019-09-19 LAB — BASIC METABOLIC PANEL
Anion gap: 8 (ref 5–15)
BUN: 10 mg/dL (ref 6–20)
CO2: 22 mmol/L (ref 22–32)
Calcium: 9.4 mg/dL (ref 8.9–10.3)
Chloride: 106 mmol/L (ref 98–111)
Creatinine, Ser: 0.7 mg/dL (ref 0.44–1.00)
GFR calc Af Amer: >60 mL/min (ref >60)
GFR calc non Af Amer: >60 mL/min (ref >60)
Glucose, Bld: 83 mg/dL (ref 70–99)
Potassium: 4 mmol/L (ref 3.5–5.1)
Sodium: 136 mmol/L (ref 135–145)

## 2019-09-19 LAB — URINALYSIS, ROUTINE W REFLEX MICROSCOPIC
Bilirubin Urine: NEGATIVE
Glucose, UA: NEGATIVE mg/dL
Hgb urine dipstick: NEGATIVE
Ketones, ur: 80 mg/dL — AB
Leukocytes,Ua: NEGATIVE
Nitrite: NEGATIVE
Protein, ur: 30 mg/dL — AB
Specific Gravity, Urine: 1.025 (ref 1.005–1.030)
pH: 7 (ref 5.0–8.0)

## 2019-09-19 MED ORDER — LACTATED RINGERS IV BOLUS
1000.0000 mL | Freq: Once | INTRAVENOUS | Status: AC
  Administered 2019-09-19: 1000 mL via INTRAVENOUS

## 2019-09-19 MED ORDER — FAMOTIDINE IN NACL 20-0.9 MG/50ML-% IV SOLN
20.0000 mg | Freq: Once | INTRAVENOUS | Status: AC
  Administered 2019-09-19: 20 mg via INTRAVENOUS
  Filled 2019-09-19: qty 50

## 2019-09-19 MED ORDER — PROMETHAZINE HCL 25 MG/ML IJ SOLN
25.0000 mg | Freq: Once | INTRAMUSCULAR | Status: AC
  Administered 2019-09-19: 25 mg via INTRAVENOUS
  Filled 2019-09-19: qty 1

## 2019-09-19 MED ORDER — GLYCOPYRROLATE 0.2 MG/ML IJ SOLN
0.2000 mg | Freq: Once | INTRAMUSCULAR | Status: AC
  Administered 2019-09-19: 0.2 mg via INTRAVENOUS
  Filled 2019-09-19: qty 1

## 2019-09-19 NOTE — MAU Provider Note (Addendum)
Chief Complaint: Emesis   First Provider Initiated Contact with Patient 09/19/19 1944     SUBJECTIVE HPI: Vicki Harper is a 24 y.o. G1P0 at [redacted]w[redacted]d who presents to Maternity Admissions reporting nausea & vomiting. This is her 9th hospital visit since the beginning of the pregnancy for n/v. Since Sunday, she has been taking reglan, zofran, phenergan, & using a scopolamine patch. This morning she took reglan prior to eating breakfast & then started vomiting. Hasn't taken any of her other medications since then. Tried to eat a sandwich & drink water later in the day but unable to keep it down. States she's vomited over 5 times since this morning. Denies abdominal pain or vaginal bleeding.   Past Medical History:  Diagnosis Date  . Asthma   . Back pain   . Headache    OB History  Gravida Para Term Preterm AB Living  1            SAB TAB Ectopic Multiple Live Births               # Outcome Date GA Lbr Len/2nd Weight Sex Delivery Anes PTL Lv  1 Current            Past Surgical History:  Procedure Laterality Date  . NO PAST SURGERIES     Social History   Socioeconomic History  . Marital status: Single    Spouse name: Not on file  . Number of children: Not on file  . Years of education: Not on file  . Highest education level: Not on file  Occupational History  . Not on file  Tobacco Use  . Smoking status: Never Smoker  . Smokeless tobacco: Never Used  Substance and Sexual Activity  . Alcohol use: No    Alcohol/week: 0.0 standard drinks  . Drug use: No  . Sexual activity: Yes    Birth control/protection: None  Other Topics Concern  . Not on file  Social History Narrative  . Not on file   Social Determinants of Health   Financial Resource Strain:   . Difficulty of Paying Living Expenses:   Food Insecurity:   . Worried About Charity fundraiser in the Last Year:   . Arboriculturist in the Last Year:   Transportation Needs:   . Film/video editor (Medical):   Marland Kitchen Lack  of Transportation (Non-Medical):   Physical Activity:   . Days of Exercise per Week:   . Minutes of Exercise per Session:   Stress:   . Feeling of Stress :   Social Connections:   . Frequency of Communication with Friends and Family:   . Frequency of Social Gatherings with Friends and Family:   . Attends Religious Services:   . Active Member of Clubs or Organizations:   . Attends Archivist Meetings:   Marland Kitchen Marital Status:   Intimate Partner Violence:   . Fear of Current or Ex-Partner:   . Emotionally Abused:   Marland Kitchen Physically Abused:   . Sexually Abused:    History reviewed. No pertinent family history. No current facility-administered medications on file prior to encounter.   Current Outpatient Medications on File Prior to Encounter  Medication Sig Dispense Refill  . albuterol (PROVENTIL) (2.5 MG/3ML) 0.083% nebulizer solution Take 2.5 mg by nebulization every 6 (six) hours as needed for wheezing or shortness of breath.    Marland Kitchen albuterol (VENTOLIN) (5 MG/ML) 0.5% NEBU Inhale into the lungs.    . metoCLOPramide (REGLAN)  10 MG tablet Take 1 tablet (10 mg total) by mouth 3 (three) times daily with meals as needed for nausea. 30 tablet 1  . ondansetron (ZOFRAN-ODT) 4 MG disintegrating tablet Take 1 tablet (4 mg total) by mouth every 6 (six) hours as needed. 30 tablet 2  . pantoprazole (PROTONIX) 20 MG tablet Take 1 tablet (20 mg total) by mouth daily. 30 tablet 2  . promethazine (PHENERGAN) 12.5 MG tablet Take 1-2 tablets (12.5-25 mg total) by mouth at bedtime as needed for nausea or vomiting. 30 tablet 2  . scopolamine (TRANSDERM-SCOP) 1 MG/3DAYS Place 1 patch (1.5 mg total) onto the skin every 3 (three) days. 10 patch 1  . albuterol (PROVENTIL HFA;VENTOLIN HFA) 108 (90 BASE) MCG/ACT inhaler Inhale 1-2 puffs into the lungs every 6 (six) hours as needed for wheezing or shortness of breath. 1 Inhaler 0   No Known Allergies  I have reviewed patient's Past Medical Hx, Surgical Hx,  Family Hx, Social Hx, medications and allergies.   Review of Systems  Constitutional: Negative.   Gastrointestinal: Positive for nausea and vomiting. Negative for abdominal pain.  Genitourinary: Negative.     OBJECTIVE Patient Vitals for the past 24 hrs:  BP Temp Temp src Pulse Resp SpO2 Height Weight  09/19/19 1842 114/63 98.8 F (37.1 C) Oral 87 16 100 % 5\' 6"  (1.676 m) 61.6 kg   Constitutional: Well-developed, well-nourished female in no acute distress. Actively vomiting while I'm assessing her Cardiovascular: normal rate & rhythm, no murmur Respiratory: normal rate and effort. Lung sounds clear throughout GI: Abd soft, non-tender, Pos BS x 4. No guarding or rebound tenderness MS: Extremities nontender, no edema, normal ROM Neurologic: Alert and oriented x 4.     LAB RESULTS Results for orders placed or performed during the hospital encounter of 09/19/19 (from the past 24 hour(s))  Urinalysis, Routine w reflex microscopic     Status: Abnormal   Collection Time: 09/19/19  7:18 PM  Result Value Ref Range   Color, Urine YELLOW YELLOW   APPearance HAZY (A) CLEAR   Specific Gravity, Urine 1.025 1.005 - 1.030   pH 7.0 5.0 - 8.0   Glucose, UA NEGATIVE NEGATIVE mg/dL   Hgb urine dipstick NEGATIVE NEGATIVE   Bilirubin Urine NEGATIVE NEGATIVE   Ketones, ur 80 (A) NEGATIVE mg/dL   Protein, ur 30 (A) NEGATIVE mg/dL   Nitrite NEGATIVE NEGATIVE   Leukocytes,Ua NEGATIVE NEGATIVE   RBC / HPF 0-5 0 - 5 RBC/hpf   WBC, UA 0-5 0 - 5 WBC/hpf   Bacteria, UA RARE (A) NONE SEEN   Squamous Epithelial / LPF 0-5 0 - 5   Mucus PRESENT    Amorphous Crystal PRESENT     IMAGING No results found.  MAU COURSE Orders Placed This Encounter  Procedures  . Urinalysis, Routine w reflex microscopic  . Basic metabolic panel   Meds ordered this encounter  Medications  . lactated ringers bolus 1,000 mL  . promethazine (PHENERGAN) injection 25 mg  . famotidine (PEPCID) IVPB 20 mg premix  .  glycopyrrolate (ROBINUL) injection 0.2 mg    MDM  Weight up nearly a kg since her visit on Sunday  Actively vomiting during her exam. Will order IV fluids & meds  Care turned over to [place], NP 09/19/2019  8:05 PM  IV hydration x 2 liters infused Pepcid, Robinul, and Phenergan given  Vitals:   09/19/19 1842 09/20/19 0020  BP: 114/63 113/67  Pulse: 87 62  Resp: 16 16  Temp: 98.8 F (37.1 C) 98.7 F (37.1 C)  SpO2: 100% 100%   0020 hrs:  States feel better and has tolerated PO intake                   Will discharge home                    Continue meds PRN at home                    Advance diet as tolerated  Seabron Spates, CNM

## 2019-09-19 NOTE — MAU Note (Signed)
Can't keep any fluids or foods down, even after taking medicines.

## 2019-09-20 DIAGNOSIS — Z3A10 10 weeks gestation of pregnancy: Secondary | ICD-10-CM

## 2019-09-20 DIAGNOSIS — O210 Mild hyperemesis gravidarum: Secondary | ICD-10-CM

## 2019-09-20 LAB — HEMOGLOBPATHY+FER W/A THAL RFX
Ferritin: 62 ng/mL (ref 15–150)
Hgb A2: 2.5 % (ref 1.8–3.2)
Hgb A: 97.5 % (ref 96.4–98.8)
Hgb F: 0 % (ref 0.0–2.0)
Hgb S: 0 %

## 2019-09-20 LAB — OBSTETRIC PANEL, INCLUDING HIV
Antibody Screen: NEGATIVE
Basophils Absolute: 0 10*3/uL (ref 0.0–0.2)
Basos: 1 %
EOS (ABSOLUTE): 0.1 10*3/uL (ref 0.0–0.4)
Eos: 1 %
HIV Screen 4th Generation wRfx: NONREACTIVE
Hematocrit: 36.6 % (ref 34.0–46.6)
Hemoglobin: 12.5 g/dL (ref 11.1–15.9)
Hepatitis B Surface Ag: NEGATIVE
Immature Grans (Abs): 0 10*3/uL (ref 0.0–0.1)
Immature Granulocytes: 0 %
Lymphocytes Absolute: 1.1 10*3/uL (ref 0.7–3.1)
Lymphs: 17 %
MCH: 30.9 pg (ref 26.6–33.0)
MCHC: 34.2 g/dL (ref 31.5–35.7)
MCV: 91 fL (ref 79–97)
Monocytes Absolute: 0.4 10*3/uL (ref 0.1–0.9)
Monocytes: 6 %
Neutrophils Absolute: 5 10*3/uL (ref 1.4–7.0)
Neutrophils: 75 %
Platelets: 227 10*3/uL (ref 150–450)
RBC: 4.04 x10E6/uL (ref 3.77–5.28)
RDW: 12.4 % (ref 11.7–15.4)
RPR Ser Ql: NONREACTIVE
Rh Factor: POSITIVE
Rubella Antibodies, IGG: 3.16 index (ref >0.99)
WBC: 6.6 10*3/uL (ref 3.4–10.8)

## 2019-09-20 LAB — SMN1 COPY NUMBER ANALYSIS (SMA CARRIER SCREENING)

## 2019-09-20 LAB — HEPATITIS C ANTIBODY: Hep C Virus Ab: <0.1 s/co ratio (ref 0.0–0.9)

## 2019-09-20 NOTE — Discharge Instructions (Signed)
Hyperemesis Gravidarum Hyperemesis gravidarum is a severe form of nausea and vomiting that happens during pregnancy. Hyperemesis is worse than morning sickness. It may cause you to have nausea or vomiting all day for many days. It may keep you from eating and drinking enough food and liquids, which can lead to dehydration, malnutrition, and weight loss. Hyperemesis usually occurs during the first half (the first 20 weeks) of pregnancy. It often goes away once a woman is in her second half of pregnancy. However, sometimes hyperemesis continues through an entire pregnancy. What are the causes? The cause of this condition is not known. It may be related to changes in chemicals (hormones) in the body during pregnancy, such as the high level of pregnancy hormone (human chorionic gonadotropin) or the increase in the female sex hormone (estrogen). What are the signs or symptoms? Symptoms of this condition include:  Nausea that does not go away.  Vomiting that does not allow you to keep any food down.  Weight loss.  Body fluid loss (dehydration).  Having no desire to eat, or not liking food that you have previously enjoyed. How is this diagnosed? This condition may be diagnosed based on:  A physical exam.  Your medical history.  Your symptoms.  Blood tests.  Urine tests. How is this treated? This condition is managed by controlling symptoms. This may include:  Following an eating plan. This can help lessen nausea and vomiting.  Taking prescription medicines. An eating plan and medicines are often used together to help control symptoms. If medicines do not help relieve nausea and vomiting, you may need to receive fluids through an IV at the hospital. Follow these instructions at home: Eating and drinking   Avoid the following: ? Drinking fluids with meals. Try not to drink anything during the 30 minutes before and after your meals. ? Drinking more than 1 cup of fluid at a  time. ? Eating foods that trigger your symptoms. These may include spicy foods, coffee, high-fat foods, very sweet foods, and acidic foods. ? Skipping meals. Nausea can be more intense on an empty stomach. If you cannot tolerate food, do not force it. Try sucking on ice chips or other frozen items and make up for missed calories later. ? Lying down within 2 hours after eating. ? Being exposed to environmental triggers. These may include food smells, smoky rooms, closed spaces, rooms with strong smells, warm or humid places, overly loud and noisy rooms, and rooms with motion or flickering lights. Try eating meals in a well-ventilated area that is free of strong smells. ? Quick and sudden changes in your movement. ? Taking iron pills and multivitamins that contain iron. If you take prescription iron pills, do not stop taking them unless your health care provider approves. ? Preparing food. The smell of food can spoil your appetite or trigger nausea.  To help relieve your symptoms: ? Listen to your body. Everyone is different and has different preferences. Find what works best for you. ? Eat and drink slowly. ? Eat 5-6 small meals daily instead of 3 large meals. Eating small meals and snacks can help you avoid an empty stomach. ? In the morning, before getting out of bed, eat a couple of crackers to avoid moving around on an empty stomach. ? Try eating starchy foods as these are usually tolerated well. Examples include cereal, toast, bread, potatoes, pasta, rice, and pretzels. ? Include at least 1 serving of protein with your meals and snacks. Protein options include   lean meats, poultry, seafood, beans, nuts, nut butters, eggs, cheese, and yogurt. ? Try eating a protein-rich snack before bed. Examples of a protein-rick snack include cheese and crackers or a peanut butter sandwich made with 1 slice of whole-wheat bread and 1 tsp (5 g) of peanut butter. ? Eat or suck on things that have ginger in them.  It may help relieve nausea. Add  tsp ground ginger to hot tea or choose ginger tea. ? Try drinking 100% fruit juice or an electrolyte drink. An electrolyte drink contains sodium, potassium, and chloride. ? Drink fluids that are cold, clear, and carbonated or sour. Examples include lemonade, ginger ale, lemon-lime soda, ice water, and sparkling water. ? Brush your teeth or use a mouth rinse after meals. ? Talk with your health care provider about starting a supplement of vitamin B6. General instructions  Take over-the-counter and prescription medicines only as told by your health care provider.  Follow instructions from your health care provider about eating or drinking restrictions.  Continue to take your prenatal vitamins as told by your health care provider. If you are having trouble taking your prenatal vitamins, talk with your health care provider about different options.  Keep all follow-up and pre-birth (prenatal) visits as told by your health care provider. This is important. Contact a health care provider if:  You have pain in your abdomen.  You have a severe headache.  You have vision problems.  You are losing weight.  You feel weak or dizzy. Get help right away if:  You cannot drink fluids without vomiting.  You vomit blood.  You have constant nausea and vomiting.  You are very weak.  You faint.  You have a fever and your symptoms suddenly get worse. Summary  Hyperemesis gravidarum is a severe form of nausea and vomiting that happens during pregnancy.  Making some changes to your eating habits may help relieve nausea and vomiting.  This condition may be managed with medicine.  If medicines do not help relieve nausea and vomiting, you may need to receive fluids through an IV at the hospital. This information is not intended to replace advice given to you by your health care provider. Make sure you discuss any questions you have with your health care  provider. Document Revised: 06/14/2017 Document Reviewed: 01/22/2016 Elsevier Patient Education  2020 [employer]  

## 2019-09-24 ENCOUNTER — Encounter (HOSPITAL_COMMUNITY): Payer: Self-pay | Admitting: Obstetrics and Gynecology

## 2019-09-24 ENCOUNTER — Inpatient Hospital Stay (HOSPITAL_COMMUNITY)
Admission: AD | Admit: 2019-09-24 | Discharge: 2019-09-24 | Disposition: A | Discharge status: Home / Self Care | Payer: Medicaid Other | Attending: Obstetrics and Gynecology | Admitting: Obstetrics and Gynecology

## 2019-09-24 ENCOUNTER — Other Ambulatory Visit: Payer: Self-pay

## 2019-09-24 DIAGNOSIS — O99611 Diseases of the digestive system complicating pregnancy, first trimester: Secondary | ICD-10-CM | POA: Diagnosis not present

## 2019-09-24 DIAGNOSIS — K117 Disturbances of salivary secretion: Secondary | ICD-10-CM | POA: Insufficient documentation

## 2019-09-24 DIAGNOSIS — O210 Mild hyperemesis gravidarum: Secondary | ICD-10-CM | POA: Insufficient documentation

## 2019-09-24 DIAGNOSIS — O219 Vomiting of pregnancy, unspecified: Secondary | ICD-10-CM | POA: Diagnosis not present

## 2019-09-24 DIAGNOSIS — Z3A11 11 weeks gestation of pregnancy: Secondary | ICD-10-CM | POA: Diagnosis not present

## 2019-09-24 LAB — URINALYSIS, ROUTINE W REFLEX MICROSCOPIC
Bilirubin Urine: NEGATIVE
Glucose, UA: NEGATIVE mg/dL
Hgb urine dipstick: NEGATIVE
Ketones, ur: NEGATIVE mg/dL
Nitrite: NEGATIVE
Protein, ur: 30 mg/dL — AB
Specific Gravity, Urine: 1.027 (ref 1.005–1.030)
pH: 8 (ref 5.0–8.0)

## 2019-09-24 MED ORDER — GLYCOPYRROLATE 1 MG PO TABS
1.0000 mg | ORAL_TABLET | Freq: Once | ORAL | Status: AC
  Administered 2019-09-24: 1 mg via ORAL
  Filled 2019-09-24: qty 1

## 2019-09-24 MED ORDER — PROCHLORPERAZINE MALEATE 10 MG PO TABS: 10.0000 mg | ORAL_TABLET | Freq: Two times a day (BID) | ORAL | 0 refills | Status: DC | PRN

## 2019-09-24 MED ORDER — PROCHLORPERAZINE MALEATE 10 MG PO TABS
10.0000 mg | ORAL_TABLET | Freq: Once | ORAL | Status: AC
  Administered 2019-09-24: 10 mg via ORAL
  Filled 2019-09-24: qty 1

## 2019-09-24 MED ORDER — GLYCOPYRROLATE 1 MG PO TABS: 1.0000 mg | ORAL_TABLET | Freq: Three times a day (TID) | ORAL | 0 refills | Status: DC

## 2019-09-24 NOTE — MAU Note (Signed)
Vicki Harper is a 24 y.o. at [redacted]w[redacted]d here in MAU reporting: states she had been feeling well since previous MAU visit on Tuesday but today it "went back downhill". Has vomited 3 times today. Has taken her meds, took around 1330, states she took reglan and zofran and switched her scop patch. Took phenergan last night. Currently nauseated.  Onset of complaint: today  Pain score: 0/10  Vitals:   09/24/19 1532  BP: 106/61  Pulse: 80  Resp: 16  Temp: 98.5 F (36.9 C)  SpO2: 99%     Lab orders placed from triage: UA

## 2019-09-24 NOTE — MAU Note (Signed)
Patient tolerating oral medications and ice water at this time.

## 2019-09-24 NOTE — MAU Provider Note (Signed)
History     CSN: [id number]  Arrival date and time: 09/24/19 1508   First Provider Initiated Contact with Patient 09/24/19 1557      Chief Complaint  Patient presents with  . Nausea  . Emesis   HPI Vicki Harper is a 24 y.o. G1P0 at [redacted]w[redacted]d who presents with nausea and vomiting. This is her 10th visit to the ED for the same complaint since finding out she was pregnant. She states all week she has only vomited one time a day but today she has vomited 3 times so she came in to be seen. She reports intermittent nausea now but no vomiting since 1300.    OB History    Gravida  1   Para      Term      Preterm      AB      Living        SAB      TAB      Ectopic      Multiple      Live Births              Past Medical History:  Diagnosis Date  . Asthma   . Back pain   . Headache     Past Surgical History:  Procedure Laterality Date  . NO PAST SURGERIES      History reviewed. No pertinent family history.  Social History   Tobacco Use  . Smoking status: Never Smoker  . Smokeless tobacco: Never Used  Substance Use Topics  . Alcohol use: No    Alcohol/week: 0.0 standard drinks  . Drug use: No    Allergies: No Known Allergies  Medications Prior to Admission  Medication Sig Dispense Refill Last Dose  . albuterol (PROVENTIL HFA;VENTOLIN HFA) 108 (90 BASE) MCG/ACT inhaler Inhale 1-2 puffs into the lungs every 6 (six) hours as needed for wheezing or shortness of breath. 1 Inhaler 0   . albuterol (PROVENTIL) (2.5 MG/3ML) 0.083% nebulizer solution Take 2.5 mg by nebulization every 6 (six) hours as needed for wheezing or shortness of breath.     Marland Kitchen albuterol (VENTOLIN) (5 MG/ML) 0.5% NEBU Inhale into the lungs.     . metoCLOPramide (REGLAN) 10 MG tablet Take 1 tablet (10 mg total) by mouth 3 (three) times daily with meals as needed for nausea. 30 tablet 1   . ondansetron (ZOFRAN-ODT) 4 MG disintegrating tablet Take 1 tablet (4 mg total) by mouth every 6 (six)  hours as needed. 30 tablet 2   . pantoprazole (PROTONIX) 20 MG tablet Take 1 tablet (20 mg total) by mouth daily. 30 tablet 2   . promethazine (PHENERGAN) 12.5 MG tablet Take 1-2 tablets (12.5-25 mg total) by mouth at bedtime as needed for nausea or vomiting. 30 tablet 2   . scopolamine (TRANSDERM-SCOP) 1 MG/3DAYS Place 1 patch (1.5 mg total) onto the skin every 3 (three) days. 10 patch 1     Review of Systems  Constitutional: Negative.  Negative for fatigue and fever.  HENT: Negative.   Respiratory: Negative.  Negative for shortness of breath.   Cardiovascular: Negative.  Negative for chest pain.  Gastrointestinal: Positive for nausea. Negative for abdominal pain, constipation, diarrhea and vomiting.  Genitourinary: Negative.  Negative for dysuria, vaginal bleeding and vaginal discharge.  Neurological: Negative.  Negative for dizziness and headaches.   Physical Exam   Blood pressure 106/61, pulse 80, temperature 98.5 F (36.9 C), temperature source Oral, resp. rate 16, height 5\' 6"  (  1.676 m), weight 62.6 kg, last menstrual period 07/05/2019, SpO2 99 %.  Physical Exam  Nursing note and vitals reviewed. Constitutional: She is oriented to person, place, and time. She appears well-developed and well-nourished. No distress.  HENT:  Head: Normocephalic.  Eyes: Pupils are equal, round, and reactive to light.  Cardiovascular: Normal rate, regular rhythm and normal heart sounds.  Respiratory: Effort normal and breath sounds normal. No respiratory distress.  GI: Soft. Bowel sounds are normal. She exhibits no distension. There is no abdominal tenderness.  Neurological: She is alert and oriented to person, place, and time.  Skin: Skin is warm and dry.  Psychiatric: She has a normal mood and affect. Her behavior is normal. Judgment and thought content normal.   FHT: 152bpm  MAU Course  Procedures Results for orders placed or performed during the hospital encounter of 09/24/19 (from the past  24 hour(s))  Urinalysis, Routine w reflex microscopic     Status: Abnormal   Collection Time: 09/24/19  4:11 PM  Result Value Ref Range   Color, Urine YELLOW YELLOW   APPearance CLOUDY (A) CLEAR   Specific Gravity, Urine 1.027 1.005 - 1.030   pH 8.0 5.0 - 8.0   Glucose, UA NEGATIVE NEGATIVE mg/dL   Hgb urine dipstick NEGATIVE NEGATIVE   Bilirubin Urine NEGATIVE NEGATIVE   Ketones, ur NEGATIVE NEGATIVE mg/dL   Protein, ur 30 (A) NEGATIVE mg/dL   Nitrite NEGATIVE NEGATIVE   Leukocytes,Ua SMALL (A) NEGATIVE   RBC / HPF 0-5 0 - 5 RBC/hpf   WBC, UA 11-20 0 - 5 WBC/hpf   Bacteria, UA RARE (A) NONE SEEN   Squamous Epithelial / LPF 0-5 0 - 5   Mucus PRESENT    MDM UA Compazine PO Robinul PO  Patient with 1kg weight gain since her MAU visit on 4/13. Lengthy discussion of normalcy of vomiting throughout the day and commended her on weight gain. Reviewed that the expectation is not to stop vomiting but to reduce the number of times and help patient function throughout the day.  Discussed with patient importance of taking medications on a schedule. Will add robinul to her routine because patient is reporting constant spitting.   Assessment and Plan   1. Nausea and vomiting during pregnancy prior to [redacted] weeks gestation   2. [redacted] weeks gestation of pregnancy   3. Ptyalism    -Discharge home in stable condition -Rx for compazine and robinul sent to patient's pharmacy -Nausea and vomiting precautions discussed -Patient advised to follow-up with [facility] as scheduled for prenatal care -Patient may return to MAU as needed or if her condition were to change or worsen   Wende Mott CNM 09/24/2019, 3:57 PM

## 2019-09-24 NOTE — MAU Note (Signed)
Patient observed spitting into a cup lined with paper towels. RN asked patient if she has been spitting. Patient replied that she has been spitting "all the time". CNM in department and updated.

## 2019-09-24 NOTE — Discharge Instructions (Signed)

## 2019-09-26 ENCOUNTER — Inpatient Hospital Stay (HOSPITAL_COMMUNITY)
Admission: AD | Admit: 2019-09-26 | Discharge: 2019-09-26 | Disposition: A | Discharge status: Home / Self Care | Payer: Medicaid Other | Attending: Family Medicine | Admitting: Family Medicine

## 2019-09-26 ENCOUNTER — Encounter (HOSPITAL_COMMUNITY): Payer: Self-pay | Admitting: Family Medicine

## 2019-09-26 ENCOUNTER — Other Ambulatory Visit: Payer: Self-pay

## 2019-09-26 DIAGNOSIS — O26891 Other specified pregnancy related conditions, first trimester: Secondary | ICD-10-CM | POA: Insufficient documentation

## 2019-09-26 DIAGNOSIS — Z3A11 11 weeks gestation of pregnancy: Secondary | ICD-10-CM | POA: Insufficient documentation

## 2019-09-26 DIAGNOSIS — O219 Vomiting of pregnancy, unspecified: Secondary | ICD-10-CM

## 2019-09-26 DIAGNOSIS — O210 Mild hyperemesis gravidarum: Secondary | ICD-10-CM | POA: Insufficient documentation

## 2019-09-26 DIAGNOSIS — R109 Unspecified abdominal pain: Secondary | ICD-10-CM | POA: Insufficient documentation

## 2019-09-26 DIAGNOSIS — Z3400 Encounter for supervision of normal first pregnancy, unspecified trimester: Secondary | ICD-10-CM

## 2019-09-26 LAB — URINALYSIS, ROUTINE W REFLEX MICROSCOPIC
Bilirubin Urine: NEGATIVE
Glucose, UA: NEGATIVE mg/dL
Hgb urine dipstick: NEGATIVE
Ketones, ur: 5 mg/dL — AB
Nitrite: NEGATIVE
Protein, ur: 30 mg/dL — AB
Specific Gravity, Urine: 1.027 (ref 1.005–1.030)
pH: 7 (ref 5.0–8.0)

## 2019-09-26 MED ORDER — ONDANSETRON 4 MG PO TBDP
8.0000 mg | ORAL_TABLET | Freq: Once | ORAL | Status: AC
  Administered 2019-09-26: 8 mg via ORAL
  Filled 2019-09-26: qty 2

## 2019-09-26 MED ORDER — PROMETHAZINE HCL 25 MG RE SUPP
25.0000 mg | Freq: Once | RECTAL | Status: AC
  Administered 2019-09-26: 25 mg via RECTAL
  Filled 2019-09-26: qty 1

## 2019-09-26 NOTE — MAU Provider Note (Signed)
History     CSN: [id number]  Arrival date and time: 09/26/19 1319   First Provider Initiated Contact with Patient 09/26/19 1622      Chief Complaint  Patient presents with  . Emesis  . Abdominal Pain  . Blurred Vision   Vicki Harper is a 24 y.o. G1P0 at 104w6d who receives care at [facility].  She presents today for Emesis.  Patient states she has thrown about 6 times today.  She reports trying to eat some granola, yogurt, and dry frosted flakes at 12pm, but threw up shortly after.  Patient reports she had cooked spinach yesterday.  She also reports trying to drink water, but has been unable to keep that down. Patient states she has not taken any medication today.  She reports taking robinul, zofran, and phenergan yesterday.  Patient also reports having her scopolamine patch in place. Patient with frequent spitting and one incident of vomiting while provider at bedside.       OB History    Gravida  1   Para      Term      Preterm      AB      Living        SAB      TAB      Ectopic      Multiple      Live Births              Past Medical History:  Diagnosis Date  . Asthma   . Back pain   . Headache     Past Surgical History:  Procedure Laterality Date  . NO PAST SURGERIES      History reviewed. No pertinent family history.  Social History   Tobacco Use  . Smoking status: Never Smoker  . Smokeless tobacco: Never Used  Substance Use Topics  . Alcohol use: No    Alcohol/week: 0.0 standard drinks  . Drug use: No    Allergies: No Known Allergies  Medications Prior to Admission  Medication Sig Dispense Refill Last Dose  . glycopyrrolate (ROBINUL) 1 MG tablet Take 1 tablet (1 mg total) by mouth 3 (three) times daily. 90 tablet 0 09/25/2019 at Unknown time  . metoCLOPramide (REGLAN) 10 MG tablet Take 1 tablet (10 mg total) by mouth 3 (three) times daily with meals as needed for nausea. 30 tablet 1 09/26/2019 at Unknown time  . ondansetron (ZOFRAN-ODT)  4 MG disintegrating tablet Take 1 tablet (4 mg total) by mouth every 6 (six) hours as needed. 30 tablet 2 09/25/2019 at Unknown time  . pantoprazole (PROTONIX) 20 MG tablet Take 1 tablet (20 mg total) by mouth daily. 30 tablet 2 Past Week at Unknown time  . prochlorperazine (COMPAZINE) 10 MG tablet Take 1 tablet (10 mg total) by mouth 2 (two) times daily as needed for nausea or vomiting. 60 tablet 0 Past Week at Unknown time  . promethazine (PHENERGAN) 12.5 MG tablet Take 1-2 tablets (12.5-25 mg total) by mouth at bedtime as needed for nausea or vomiting. 30 tablet 2 09/25/2019 at Unknown time  . scopolamine (TRANSDERM-SCOP) 1 MG/3DAYS Place 1 patch (1.5 mg total) onto the skin every 3 (three) days. 10 patch 1 09/26/2019 at Unknown time  . albuterol (PROVENTIL HFA;VENTOLIN HFA) 108 (90 BASE) MCG/ACT inhaler Inhale 1-2 puffs into the lungs every 6 (six) hours as needed for wheezing or shortness of breath. 1 Inhaler 0 More than a month at Unknown time  . albuterol (PROVENTIL) (2.5 MG/3ML)  0.083% nebulizer solution Take 2.5 mg by nebulization every 6 (six) hours as needed for wheezing or shortness of breath.     Marland Kitchen albuterol (VENTOLIN) (5 MG/ML) 0.5% NEBU Inhale into the lungs.       Review of Systems  Constitutional: Negative for chills and fever.  HENT: Positive for sore throat (From throwing up).   Respiratory: Positive for cough (Productive). Negative for shortness of breath.   Gastrointestinal: Positive for abdominal pain (sharp side pain ), nausea and vomiting. Negative for constipation and diarrhea.  Genitourinary: Negative for difficulty urinating, dysuria, pelvic pain, vaginal bleeding and vaginal discharge.  Musculoskeletal: Negative for back pain.  Neurological: Negative for dizziness, light-headedness and headaches.   Physical Exam   Blood pressure 105/64, pulse 77, temperature 98.3 F (36.8 C), temperature source Oral, resp. rate 16, weight 61.4 kg, last menstrual period 07/05/2019, SpO2  100 %.  Physical Exam  Constitutional: She is oriented to person, place, and time. She appears well-developed and well-nourished. No distress.  HENT:  Head: Normocephalic and atraumatic.  Eyes: Conjunctivae are normal.  Cardiovascular: Normal rate.  Respiratory: Effort normal.  Musculoskeletal:        General: Normal range of motion.     Cervical back: Normal range of motion.  Neurological: She is alert and oriented to person, place, and time.  Psychiatric: She has a normal mood and affect. Her behavior is normal.    MAU Course  Procedures Results for orders placed or performed during the hospital encounter of 09/26/19 (from the past 24 hour(s))  Urinalysis, Routine w reflex microscopic     Status: Abnormal   Collection Time: 09/26/19  3:30 PM  Result Value Ref Range   Color, Urine YELLOW YELLOW   APPearance HAZY (A) CLEAR   Specific Gravity, Urine 1.027 1.005 - 1.030   pH 7.0 5.0 - 8.0   Glucose, UA NEGATIVE NEGATIVE mg/dL   Hgb urine dipstick NEGATIVE NEGATIVE   Bilirubin Urine NEGATIVE NEGATIVE   Ketones, ur 5 (A) NEGATIVE mg/dL   Protein, ur 30 (A) NEGATIVE mg/dL   Nitrite NEGATIVE NEGATIVE   Leukocytes,Ua SMALL (A) NEGATIVE   RBC / HPF 0-5 0 - 5 RBC/hpf   WBC, UA 0-5 0 - 5 WBC/hpf   Bacteria, UA RARE (A) NONE SEEN   Squamous Epithelial / LPF 0-5 0 - 5   Mucus PRESENT    Amorphous Crystal PRESENT     MDM Labs: UA AntiEmetics Assessment and Plan  24 year old G1P0 SIUP at 11.6 weeks N/V  -POC discussed and to include oral and rectal medications. -Patient informed that she should expect some nausea and vomiting during pregnancy and needs to try to manage at home.  -Discussed proper nutrition during pregnancy to avoid feelings of nausea to include small frequent meals throughout the day and high protein snacks. -Encouraged to drink carbonated drinks and suck on ice or sugar free popsicles to help remain hydrated during periods of nausea.  Encouraged to avoid  drinking water as this can be difficult to digest/keep down.  -Will give Phenergan suppository f/b Zofran odt. -Patient given ice chips and cranberry juice. -Will monitor and reassess.   [name] 09/26/2019, 4:22 PM   Reassessment (5:57 PM)  -Patient tolerating fluids. -Reports continued nausea. -Educated on expectation of nausea during pregnancy and goal is to manage at home. -Instructed to restart diclegis 2 tablets at bedtime and take zofran and phenergan prn. -Instructed to start diclegis tonight upon arriving home. -Reiterated need to  eat high protein meals and avoid not eating as this makes nausea/vomiting worse. -Discussed occasional usage of protein shakes when not tolerating foods and/or fluids. -Instructed to keep next appt as scheduled. -Encouraged to call or return to MAU if symptoms worsen or with the onset of new symptoms. -Discharged to home in stable condition.  [name] MSN, CNM Advanced Practice Provider, [facility]

## 2019-09-26 NOTE — MAU Note (Signed)
Can't keep anything down.  Gets pains off and on, coming from the sides.  Having a little bit of blurred vision.

## 2019-09-26 NOTE — Discharge Instructions (Signed)

## 2019-09-28 ENCOUNTER — Inpatient Hospital Stay (HOSPITAL_COMMUNITY)
Admission: AD | Admit: 2019-09-28 | Discharge: 2019-09-28 | Disposition: A | Discharge status: Home / Self Care | Payer: Medicaid Other | Attending: Obstetrics & Gynecology | Admitting: Obstetrics & Gynecology

## 2019-09-28 ENCOUNTER — Encounter (HOSPITAL_COMMUNITY): Payer: Self-pay | Admitting: Obstetrics & Gynecology

## 2019-09-28 DIAGNOSIS — O210 Mild hyperemesis gravidarum: Secondary | ICD-10-CM

## 2019-09-28 DIAGNOSIS — O211 Hyperemesis gravidarum with metabolic disturbance: Secondary | ICD-10-CM | POA: Diagnosis not present

## 2019-09-28 DIAGNOSIS — Z3A12 12 weeks gestation of pregnancy: Secondary | ICD-10-CM | POA: Diagnosis not present

## 2019-09-28 LAB — URINALYSIS, ROUTINE W REFLEX MICROSCOPIC
Bilirubin Urine: NEGATIVE
Glucose, UA: NEGATIVE mg/dL
Hgb urine dipstick: NEGATIVE
Ketones, ur: 20 mg/dL — AB
Nitrite: NEGATIVE
Protein, ur: 30 mg/dL — AB
Specific Gravity, Urine: 1.027 (ref 1.005–1.030)
pH: 6 (ref 5.0–8.0)

## 2019-09-28 LAB — BASIC METABOLIC PANEL
Anion gap: 10 (ref 5–15)
BUN: 12 mg/dL (ref 6–20)
CO2: 23 mmol/L (ref 22–32)
Calcium: 9.9 mg/dL (ref 8.9–10.3)
Chloride: 104 mmol/L (ref 98–111)
Creatinine, Ser: 0.75 mg/dL (ref 0.44–1.00)
GFR calc Af Amer: >60 mL/min (ref >60)
GFR calc non Af Amer: >60 mL/min (ref >60)
Glucose, Bld: 86 mg/dL (ref 70–99)
Potassium: 4 mmol/L (ref 3.5–5.1)
Sodium: 137 mmol/L (ref 135–145)

## 2019-09-28 MED ORDER — M.V.I. ADULT IV INJ
Freq: Once | INTRAVENOUS | Status: AC
  Filled 2019-09-28: qty 10

## 2019-09-28 MED ORDER — LACTATED RINGERS IV SOLN: Freq: Once | INTRAVENOUS | Status: AC

## 2019-09-28 MED ORDER — PROMETHAZINE HCL 25 MG RE SUPP
25.0000 mg | Freq: Once | RECTAL | Status: AC
  Administered 2019-09-28: 25 mg via RECTAL
  Filled 2019-09-28: qty 1

## 2019-09-28 NOTE — MAU Provider Note (Addendum)
Chief Complaint: Emesis and Nausea   First Provider Initiated Contact with Patient 09/28/19 [phone]        SUBJECTIVE HPI: Vicki Harper is a 24 y.o. G1P0 at [redacted]w[redacted]d by LMP who presents to maternity admissions reporting recurrent vomiting since 1830.  Took Phenergan at 2200 per advice of Dr Elonda Husky.  Placed a new scopolamine patch yesterday.  Has continued to vomit throught the night.  She denies vaginal bleeding, vaginal itching/burning, urinary symptoms, h/a, dizziness, or fever/chills.    Wts:  08/18/19   65.3kg           09/13/19     60.8kg           09/28/19   60.8kg  Emesis  This is a recurrent problem. The current episode started yesterday. The problem occurs 5 to 10 times per day. The problem has been unchanged. The emesis has an appearance of coffee grounds. There has been no fever. Pertinent negatives include no abdominal pain, chills, coughing, diarrhea, fever, myalgias or weight loss (but no gain since early April, weight is same).   RN note: Patient states she is unable to keep anything down since 1830 last night.  States the vomit resembles coffee-grounds.  Also reports HA/dizziness and some chest pain from the vomiting.  States she vomited 6X in the last 24 hours. Took zofran, phenergan, diclegis & robinul.  Denies VB/cramping/discharge  Past Medical History:  Diagnosis Date  . Asthma   . Back pain   . Headache    Past Surgical History:  Procedure Laterality Date  . NO PAST SURGERIES     Social History   Socioeconomic History  . Marital status: Single    Spouse name: Not on file  . Number of children: Not on file  . Years of education: Not on file  . Highest education level: Not on file  Occupational History  . Not on file  Tobacco Use  . Smoking status: Never Smoker  . Smokeless tobacco: Never Used  Substance and Sexual Activity  . Alcohol use: No    Alcohol/week: 0.0 standard drinks  . Drug use: No  . Sexual activity: Yes    Birth control/protection: None  Other  Topics Concern  . Not on file  Social History Narrative  . Not on file   Social Determinants of Health   Financial Resource Strain:   . Difficulty of Paying Living Expenses:   Food Insecurity:   . Worried About Charity fundraiser in the Last Year:   . Arboriculturist in the Last Year:   Transportation Needs:   . Film/video editor (Medical):   Marland Kitchen Lack of Transportation (Non-Medical):   Physical Activity:   . Days of Exercise per Week:   . Minutes of Exercise per Session:   Stress:   . Feeling of Stress :   Social Connections:   . Frequency of Communication with Friends and Family:   . Frequency of Social Gatherings with Friends and Family:   . Attends Religious Services:   . Active Member of Clubs or Organizations:   . Attends Archivist Meetings:   Marland Kitchen Marital Status:   Intimate Partner Violence:   . Fear of Current or Ex-Partner:   . Emotionally Abused:   Marland Kitchen Physically Abused:   . Sexually Abused:    No current facility-administered medications on file prior to encounter.   Current Outpatient Medications on File Prior to Encounter  Medication Sig Dispense Refill  . glycopyrrolate (  ROBINUL) 1 MG tablet Take 1 tablet (1 mg total) by mouth 3 (three) times daily. 90 tablet 0  . ondansetron (ZOFRAN-ODT) 4 MG disintegrating tablet Take 1 tablet (4 mg total) by mouth every 6 (six) hours as needed. 30 tablet 2  . pantoprazole (PROTONIX) 20 MG tablet Take 1 tablet (20 mg total) by mouth daily. 30 tablet 2  . promethazine (PHENERGAN) 12.5 MG tablet Take 1-2 tablets (12.5-25 mg total) by mouth at bedtime as needed for nausea or vomiting. 30 tablet 2  . scopolamine (TRANSDERM-SCOP) 1 MG/3DAYS Place 1 patch (1.5 mg total) onto the skin every 3 (three) days. 10 patch 1  . albuterol (PROVENTIL HFA;VENTOLIN HFA) 108 (90 BASE) MCG/ACT inhaler Inhale 1-2 puffs into the lungs every 6 (six) hours as needed for wheezing or shortness of breath. 1 Inhaler 0  . albuterol (PROVENTIL)  (2.5 MG/3ML) 0.083% nebulizer solution Take 2.5 mg by nebulization every 6 (six) hours as needed for wheezing or shortness of breath.    Marland Kitchen albuterol (VENTOLIN) (5 MG/ML) 0.5% NEBU Inhale into the lungs.     No Known Allergies  I have reviewed patient's Past Medical Hx, Surgical Hx, Family Hx, Social Hx, medications and allergies.   ROS:  Review of Systems  Constitutional: Negative for chills, fever and weight loss (but no gain since early April, weight is same).  Respiratory: Negative for cough.   Gastrointestinal: Positive for vomiting. Negative for abdominal pain and diarrhea.  Musculoskeletal: Negative for myalgias.   Review of Systems  Other systems negative   Physical Exam  Physical Exam Patient Vitals for the past 24 hrs:  BP Temp Pulse Resp Weight  09/28/19 0452 108/70 98.6 F (37 C) 83 15 60.8 kg   Constitutional: Well-developed, well-nourished female in no acute distress.  Cardiovascular: normal rate Respiratory: normal effort GI: Abd soft, non-tender. Pos BS x 4 MS: Extremities nontender, no edema, normal ROM Neurologic: Alert and oriented x 4.  GU: Neg CVAT.  PELVIC EXAM: deferred  FHT 148 by doppler  LAB RESULTS Results for orders placed or performed during the hospital encounter of 09/28/19 (from the past 24 hour(s))  Urinalysis, Routine w reflex microscopic     Status: Abnormal   Collection Time: 09/28/19  5:26 AM  Result Value Ref Range   Color, Urine YELLOW YELLOW   APPearance HAZY (A) CLEAR   Specific Gravity, Urine 1.027 1.005 - 1.030   pH 6.0 5.0 - 8.0   Glucose, UA NEGATIVE NEGATIVE mg/dL   Hgb urine dipstick NEGATIVE NEGATIVE   Bilirubin Urine NEGATIVE NEGATIVE   Ketones, ur 20 (A) NEGATIVE mg/dL   Protein, ur 30 (A) NEGATIVE mg/dL   Nitrite NEGATIVE NEGATIVE   Leukocytes,Ua SMALL (A) NEGATIVE   RBC / HPF 0-5 0 - 5 RBC/hpf   WBC, UA 0-5 0 - 5 WBC/hpf   Bacteria, UA RARE (A) NONE SEEN   Squamous Epithelial / LPF 6-10 0 - 5   Mucus PRESENT    Basic metabolic panel     Status: None   Collection Time: 09/28/19  5:26 AM  Result Value Ref Range   Sodium 137 135 - 145 mmol/L   Potassium 4.0 3.5 - 5.1 mmol/L   Chloride 104 98 - 111 mmol/L   CO2 23 22 - 32 mmol/L   Glucose, Bld 86 70 - 99 mg/dL   BUN 12 6 - 20 mg/dL   Creatinine, Ser 0.75 0.44 - 1.00 mg/dL   Calcium 9.9 8.9 - 10.3 mg/dL  GFR calc non Af Amer >60 >60 mL/min   GFR calc Af Amer >60 >60 mL/min   Anion gap 10 5 - 15   B/Positive/-- (04/06 1047)  IMAGING No results found.  MAU Management/MDM: Ordered MVI infusion Will give Phenergan suppository and will add zofran as needed.  Pt states she prefers phenergan After MVI infusion completed, UA showed dehydration with spGr and ketones, so we opted to give one additional liter of LR for increased hydration.   Discussed possibility of using outpatient infusions in future. Message sent to office to see if we can arrange 2-3x/week IV infusions via [facility].  ASSESSMENT Single IUP at .[redacted]w[redacted]d Hyperemesis Dehydration  PLAN Discharge home after infusion complete Advised to continue meds at home Advance diet as tolerated, continuing with advice from last MAU visit.  Pt stable at time of discharge. Encouraged to return here or to other Urgent Care/ED if she develops worsening of symptoms, increase in pain, fever, or other concerning symptoms.    Hansel Feinstein CNM, MSN Certified Nurse-Midwife 09/28/2019  5:12 AM   0915: Patient sleeping in bed; has kept down crackers. Previous CNM sent message to office to schedule patient for outpatient infusion of phenergan; patient knows that they will be calling her to schedule.   Patient stable for discharge.  Maye Hides

## 2019-09-28 NOTE — MAU Note (Addendum)
Patient states she is unable to keep anything down since 1830 last night.  States the vomit resembles coffee-grounds.  Also reports HA/dizziness and some chest pain from the vomiting.  States she vomited 6X in the last 24 hours. Took zofran, phenergan, diclegis & robinul.  Denies VB/cramping/discharge.

## 2019-09-28 NOTE — Discharge Instructions (Signed)
Hyperemesis Gravidarum Hyperemesis gravidarum is a severe form of nausea and vomiting that happens during pregnancy. Hyperemesis is worse than morning sickness. It may cause you to have nausea or vomiting all day for many days. It may keep you from eating and drinking enough food and liquids, which can lead to dehydration, malnutrition, and weight loss. Hyperemesis usually occurs during the first half (the first 20 weeks) of pregnancy. It often goes away once a woman is in her second half of pregnancy. However, sometimes hyperemesis continues through an entire pregnancy. What are the causes? The cause of this condition is not known. It may be related to changes in chemicals (hormones) in the body during pregnancy, such as the high level of pregnancy hormone (human chorionic gonadotropin) or the increase in the female sex hormone (estrogen). What are the signs or symptoms? Symptoms of this condition include:  Nausea that does not go away.  Vomiting that does not allow you to keep any food down.  Weight loss.  Body fluid loss (dehydration).  Having no desire to eat, or not liking food that you have previously enjoyed. How is this diagnosed? This condition may be diagnosed based on:  A physical exam.  Your medical history.  Your symptoms.  Blood tests.  Urine tests. How is this treated? This condition is managed by controlling symptoms. This may include:  Following an eating plan. This can help lessen nausea and vomiting.  Taking prescription medicines. An eating plan and medicines are often used together to help control symptoms. If medicines do not help relieve nausea and vomiting, you may need to receive fluids through an IV at the hospital. Follow these instructions at home: Eating and drinking   Avoid the following: ? Drinking fluids with meals. Try not to drink anything during the 30 minutes before and after your meals. ? Drinking more than 1 cup of fluid at a  time. ? Eating foods that trigger your symptoms. These may include spicy foods, coffee, high-fat foods, very sweet foods, and acidic foods. ? Skipping meals. Nausea can be more intense on an empty stomach. If you cannot tolerate food, do not force it. Try sucking on ice chips or other frozen items and make up for missed calories later. ? Lying down within 2 hours after eating. ? Being exposed to environmental triggers. These may include food smells, smoky rooms, closed spaces, rooms with strong smells, warm or humid places, overly loud and noisy rooms, and rooms with motion or flickering lights. Try eating meals in a well-ventilated area that is free of strong smells. ? Quick and sudden changes in your movement. ? Taking iron pills and multivitamins that contain iron. If you take prescription iron pills, do not stop taking them unless your health care provider approves. ? Preparing food. The smell of food can spoil your appetite or trigger nausea.  To help relieve your symptoms: ? Listen to your body. Everyone is different and has different preferences. Find what works best for you. ? Eat and drink slowly. ? Eat 5-6 small meals daily instead of 3 large meals. Eating small meals and snacks can help you avoid an empty stomach. ? In the morning, before getting out of bed, eat a couple of crackers to avoid moving around on an empty stomach. ? Try eating starchy foods as these are usually tolerated well. Examples include cereal, toast, bread, potatoes, pasta, rice, and pretzels. ? Include at least 1 serving of protein with your meals and snacks. Protein options include   lean meats, poultry, seafood, beans, nuts, nut butters, eggs, cheese, and yogurt. ? Try eating a protein-rich snack before bed. Examples of a protein-rick snack include cheese and crackers or a peanut butter sandwich made with 1 slice of whole-wheat bread and 1 tsp (5 g) of peanut butter. ? Eat or suck on things that have ginger in them.  It may help relieve nausea. Add  tsp ground ginger to hot tea or choose ginger tea. ? Try drinking 100% fruit juice or an electrolyte drink. An electrolyte drink contains sodium, potassium, and chloride. ? Drink fluids that are cold, clear, and carbonated or sour. Examples include lemonade, ginger ale, lemon-lime soda, ice water, and sparkling water. ? Brush your teeth or use a mouth rinse after meals. ? Talk with your health care provider about starting a supplement of vitamin B6. General instructions  Take over-the-counter and prescription medicines only as told by your health care provider.  Follow instructions from your health care provider about eating or drinking restrictions.  Continue to take your prenatal vitamins as told by your health care provider. If you are having trouble taking your prenatal vitamins, talk with your health care provider about different options.  Keep all follow-up and pre-birth (prenatal) visits as told by your health care provider. This is important. Contact a health care provider if:  You have pain in your abdomen.  You have a severe headache.  You have vision problems.  You are losing weight.  You feel weak or dizzy. Get help right away if:  You cannot drink fluids without vomiting.  You vomit blood.  You have constant nausea and vomiting.  You are very weak.  You faint.  You have a fever and your symptoms suddenly get worse. Summary  Hyperemesis gravidarum is a severe form of nausea and vomiting that happens during pregnancy.  Making some changes to your eating habits may help relieve nausea and vomiting.  This condition may be managed with medicine.  If medicines do not help relieve nausea and vomiting, you may need to receive fluids through an IV at the hospital. This information is not intended to replace advice given to you by your health care provider. Make sure you discuss any questions you have with your health care  provider. Document Revised: 06/14/2017 Document Reviewed: 01/22/2016 Elsevier Patient Education  2020 [employer]  

## 2019-10-02 ENCOUNTER — Encounter (HOSPITAL_COMMUNITY): Payer: Self-pay | Admitting: Obstetrics and Gynecology

## 2019-10-02 ENCOUNTER — Other Ambulatory Visit: Payer: Self-pay

## 2019-10-02 ENCOUNTER — Other Ambulatory Visit: Payer: Self-pay | Admitting: Family Medicine

## 2019-10-02 ENCOUNTER — Inpatient Hospital Stay (HOSPITAL_COMMUNITY)
Admission: AD | Admit: 2019-10-02 | Discharge: 2019-10-02 | Disposition: A | Discharge status: Home / Self Care | Payer: Medicaid Other | Attending: Obstetrics and Gynecology | Admitting: Obstetrics and Gynecology

## 2019-10-02 DIAGNOSIS — R1110 Vomiting, unspecified: Secondary | ICD-10-CM | POA: Diagnosis not present

## 2019-10-02 DIAGNOSIS — Z3400 Encounter for supervision of normal first pregnancy, unspecified trimester: Secondary | ICD-10-CM

## 2019-10-02 DIAGNOSIS — O210 Mild hyperemesis gravidarum: Secondary | ICD-10-CM

## 2019-10-02 DIAGNOSIS — O211 Hyperemesis gravidarum with metabolic disturbance: Secondary | ICD-10-CM

## 2019-10-02 DIAGNOSIS — Z3A12 12 weeks gestation of pregnancy: Secondary | ICD-10-CM | POA: Diagnosis not present

## 2019-10-02 LAB — COMPREHENSIVE METABOLIC PANEL
ALT: 10 U/L (ref 0–44)
AST: 15 U/L (ref 15–41)
Albumin: 3.6 g/dL (ref 3.5–5.0)
Alkaline Phosphatase: 26 U/L — ABNORMAL LOW (ref 38–126)
Anion gap: 10 (ref 5–15)
BUN: 10 mg/dL (ref 6–20)
CO2: 22 mmol/L (ref 22–32)
Calcium: 9.1 mg/dL (ref 8.9–10.3)
Chloride: 101 mmol/L (ref 98–111)
Creatinine, Ser: 0.68 mg/dL (ref 0.44–1.00)
GFR calc Af Amer: >60 mL/min (ref >60)
GFR calc non Af Amer: >60 mL/min (ref >60)
Glucose, Bld: 82 mg/dL (ref 70–99)
Potassium: 3.6 mmol/L (ref 3.5–5.1)
Sodium: 133 mmol/L — ABNORMAL LOW (ref 135–145)
Total Bilirubin: 0.7 mg/dL (ref 0.3–1.2)
Total Protein: 6.5 g/dL (ref 6.5–8.1)

## 2019-10-02 LAB — URINALYSIS, ROUTINE W REFLEX MICROSCOPIC
Bilirubin Urine: NEGATIVE
Glucose, UA: NEGATIVE mg/dL
Hgb urine dipstick: NEGATIVE
Ketones, ur: NEGATIVE mg/dL
Nitrite: NEGATIVE
Protein, ur: NEGATIVE mg/dL
Specific Gravity, Urine: 1.025 (ref 1.005–1.030)
pH: 8 (ref 5.0–8.0)

## 2019-10-02 LAB — CBC WITH DIFFERENTIAL/PLATELET
Abs Immature Granulocytes: 0.03 10*3/uL (ref 0.00–0.07)
Basophils Absolute: 0 10*3/uL (ref 0.0–0.1)
Basophils Relative: 0 %
Eosinophils Absolute: 0 10*3/uL (ref 0.0–0.5)
Eosinophils Relative: 0 %
HCT: 35.8 % — ABNORMAL LOW (ref 36.0–46.0)
Hemoglobin: 11.7 g/dL — ABNORMAL LOW (ref 12.0–15.0)
Immature Granulocytes: 0 %
Lymphocytes Relative: 18 %
Lymphs Abs: 1.3 10*3/uL (ref 0.7–4.0)
MCH: 30.8 pg (ref 26.0–34.0)
MCHC: 32.7 g/dL (ref 30.0–36.0)
MCV: 94.2 fL (ref 80.0–100.0)
Monocytes Absolute: 0.3 10*3/uL (ref 0.1–1.0)
Monocytes Relative: 5 %
Neutro Abs: 5.5 10*3/uL (ref 1.7–7.7)
Neutrophils Relative %: 77 %
Platelets: 259 10*3/uL (ref 150–400)
RBC: 3.8 MIL/uL — ABNORMAL LOW (ref 3.87–5.11)
RDW: 12.6 % (ref 11.5–15.5)
WBC: 7.2 10*3/uL (ref 4.0–10.5)
nRBC: 0 % (ref 0.0–0.2)

## 2019-10-02 MED ORDER — METOCLOPRAMIDE HCL 5 MG/ML IJ SOLN
10.0000 mg | Freq: Once | INTRAMUSCULAR | Status: AC
  Administered 2019-10-02: 10 mg via INTRAVENOUS
  Filled 2019-10-02: qty 2

## 2019-10-02 MED ORDER — SODIUM CHLORIDE 0.9 % IV SOLN
8.0000 mg | Freq: Once | INTRAVENOUS | Status: AC
  Administered 2019-10-02: 8 mg via INTRAVENOUS
  Filled 2019-10-02: qty 4

## 2019-10-02 MED ORDER — DOXYLAMINE-PYRIDOXINE 10-10 MG PO TBEC: 2.0000 | DELAYED_RELEASE_TABLET | Freq: Every day | ORAL | 0 refills | Status: DC

## 2019-10-02 MED ORDER — PANTOPRAZOLE SODIUM 40 MG IV SOLR
40.0000 mg | Freq: Once | INTRAVENOUS | Status: AC
  Administered 2019-10-02: 40 mg via INTRAVENOUS
  Filled 2019-10-02: qty 40

## 2019-10-02 MED ORDER — ONDANSETRON 4 MG PO TBDP: 8.0000 mg | ORAL_TABLET | Freq: Three times a day (TID) | ORAL | 2 refills | Status: DC | PRN

## 2019-10-02 MED ORDER — LACTATED RINGERS IV BOLUS
1000.0000 mL | Freq: Once | INTRAVENOUS | Status: AC
  Administered 2019-10-02: 1000 mL via INTRAVENOUS

## 2019-10-02 NOTE — MAU Provider Note (Signed)
History     CSN: [id number]  Arrival date and time: 10/02/19 1727   First Provider Initiated Contact with Patient 10/02/19 1914      Chief Complaint  Patient presents with  . Emesis  . Dizziness  . Headache   [name] is a 24 y.o. G1P0 at [redacted]w[redacted]d who presents to MAU for N/V. Patient was last seen in MAU on 09/28/2019. Patient has been seen 9 times in MAU for N/V since 08/30/2019 and 2 times in the ED prior. Patient reports the last time she was here she was told to take Zofran 4mg  (which she takes PRN) and 12.5mg  Phenergan (once during the day because she does not like the sedating side effects) as well as Diclegis that takes 2 tablets of at night (takes daily QHS). Patient also has Scopolamine patch that she will change tomorrow. Patient reports she has not yet been given steroids. Patient reports she has taken Reglan (which worked) and Administrator, [employer]. Patient also states she has both Pepcid and Protonix at home and has been taking Pepcid BID. Patient reports she has tried Robinul, but reports it does not work. Patient reports she is going to be started on home IV infusions three times weekly by her OB that is in the process of being set up.  Patient reports today she took: -Zofran 4mg  - 1pm -Phenergan 12.5mg  - 1pm  Patient also reports she suffers migraine headaches and this headache started yesterday. Patient reports HA as 5/10. Patient reports she has not taken anything for pain because she does not know what she can take.  Pt denies VB, LOF, ctx, decreased FM, vaginal discharge/odor/itching. Pt denies abdominal pain, constipation, diarrhea, or urinary problems. Pt denies fever, chills, fatigue, sweating or changes in appetite. Pt denies SOB or chest pain. Pt denies dizziness, light-headedness, weakness.  Problems this pregnancy include: hyperemesis. Allergies? NKDA Current medications/supplements? Per above, albuterol PRN Prenatal care provider? [facility], 10/10/2019 for  genetics testing only   OB History    Gravida  1   Para      Term      Preterm      AB      Living        SAB      TAB      Ectopic      Multiple      Live Births              Past Medical History:  Diagnosis Date  . Asthma   . Back pain   . Headache     Past Surgical History:  Procedure Laterality Date  . NO PAST SURGERIES      Family History  Problem Relation Age of Onset  . Migraines Mother     Social History   Tobacco Use  . Smoking status: Never Smoker  . Smokeless tobacco: Never Used  Substance Use Topics  . Alcohol use: No    Alcohol/week: 0.0 standard drinks  . Drug use: No    Allergies: No Known Allergies  Medications Prior to Admission  Medication Sig Dispense Refill Last Dose  . albuterol (PROVENTIL HFA;VENTOLIN HFA) 108 (90 BASE) MCG/ACT inhaler Inhale 1-2 puffs into the lungs every 6 (six) hours as needed for wheezing or shortness of breath. 1 Inhaler 0 10/01/2019 at 2100  . glycopyrrolate (ROBINUL) 1 MG tablet Take 1 tablet (1 mg total) by mouth 3 (three) times daily. 90 tablet 0 Past Week at Unknown time  . pantoprazole (  PROTONIX) 20 MG tablet Take 1 tablet (20 mg total) by mouth daily. 30 tablet 2 Past Week at Unknown time  . promethazine (PHENERGAN) 12.5 MG tablet Take 1-2 tablets (12.5-25 mg total) by mouth at bedtime as needed for nausea or vomiting. 30 tablet 2 10/02/2019 at 1300  . [DISCONTINUED] ondansetron (ZOFRAN-ODT) 4 MG disintegrating tablet Take 1 tablet (4 mg total) by mouth every 6 (six) hours as needed. 30 tablet 2 10/02/2019 at 1300  . albuterol (PROVENTIL) (2.5 MG/3ML) 0.083% nebulizer solution Take 2.5 mg by nebulization every 6 (six) hours as needed for wheezing or shortness of breath.     Marland Kitchen albuterol (VENTOLIN) (5 MG/ML) 0.5% NEBU Inhale into the lungs.     Marland Kitchen scopolamine (TRANSDERM-SCOP) 1 MG/3DAYS Place 1 patch (1.5 mg total) onto the skin every 3 (three) days. 10 patch 1 09/30/2019    Review of Systems   Constitutional: Negative for chills, diaphoresis, fatigue and fever.  Eyes: Negative for visual disturbance.  Respiratory: Negative for shortness of breath.   Cardiovascular: Negative for chest pain.  Gastrointestinal: Positive for nausea and vomiting. Negative for abdominal pain, constipation and diarrhea.  Genitourinary: Negative for dysuria, flank pain, frequency, pelvic pain, urgency, vaginal bleeding and vaginal discharge.  Neurological: Positive for headaches. Negative for dizziness, weakness and light-headedness.   Physical Exam   Blood pressure 107/64, pulse 75, temperature 98.5 F (36.9 C), temperature source Oral, resp. rate 18, height 5\' 6"  (1.676 m), weight 63.5 kg, last menstrual period 07/05/2019, SpO2 100 %.  Patient Vitals for the past 24 hrs:  BP Temp Temp src Pulse Resp SpO2 Height Weight  10/02/19 1753 107/64 98.5 F (36.9 C) Oral 75 18 100 % -- --  10/02/19 1748 -- -- -- -- -- -- 5\' 6"  (1.676 m) 63.5 kg   Physical Exam  Constitutional: She is oriented to person, place, and time. She appears well-developed and well-nourished. No distress.  HENT:  Head: Normocephalic and atraumatic.  Respiratory: Effort normal.  Neurological: She is alert and oriented to person, place, and time.  Skin: She is not diaphoretic.  Psychiatric: She has a normal mood and affect. Her behavior is normal. Judgment and thought content normal.   Results for orders placed or performed during the hospital encounter of 10/02/19 (from the past 24 hour(s))  Urinalysis, Routine w reflex microscopic     Status: Abnormal   Collection Time: 10/02/19  6:06 PM  Result Value Ref Range   Color, Urine YELLOW YELLOW   APPearance CLOUDY (A) CLEAR   Specific Gravity, Urine 1.025 1.005 - 1.030   pH 8.0 5.0 - 8.0   Glucose, UA NEGATIVE NEGATIVE mg/dL   Hgb urine dipstick NEGATIVE NEGATIVE   Bilirubin Urine NEGATIVE NEGATIVE   Ketones, ur NEGATIVE NEGATIVE mg/dL   Protein, ur NEGATIVE NEGATIVE mg/dL    Nitrite NEGATIVE NEGATIVE   Leukocytes,Ua TRACE (A) NEGATIVE   WBC, UA 0-5 0 - 5 WBC/hpf   Bacteria, UA RARE (A) NONE SEEN   Squamous Epithelial / LPF 0-5 0 - 5   Mucus PRESENT    Amorphous Crystal PRESENT   CBC with Differential/Platelet     Status: Abnormal   Collection Time: 10/02/19  8:20 PM  Result Value Ref Range   WBC 7.2 4.0 - 10.5 K/uL   RBC 3.80 (L) 3.87 - 5.11 MIL/uL   Hemoglobin 11.7 (L) 12.0 - 15.0 g/dL   HCT 35.8 (L) 36.0 - 46.0 %   MCV 94.2 80.0 - 100.0 fL  MCH 30.8 26.0 - 34.0 pg   MCHC 32.7 30.0 - 36.0 g/dL   RDW 12.6 11.5 - 15.5 %   Platelets 259 150 - 400 K/uL   nRBC 0.0 0.0 - 0.2 %   Neutrophils Relative % 77 %   Neutro Abs 5.5 1.7 - 7.7 K/uL   Lymphocytes Relative 18 %   Lymphs Abs 1.3 0.7 - 4.0 K/uL   Monocytes Relative 5 %   Monocytes Absolute 0.3 0.1 - 1.0 K/uL   Eosinophils Relative 0 %   Eosinophils Absolute 0.0 0.0 - 0.5 K/uL   Basophils Relative 0 %   Basophils Absolute 0.0 0.0 - 0.1 K/uL   Immature Granulocytes 0 %   Abs Immature Granulocytes 0.03 0.00 - 0.07 K/uL  Comprehensive metabolic panel     Status: Abnormal   Collection Time: 10/02/19  8:20 PM  Result Value Ref Range   Sodium 133 (L) 135 - 145 mmol/L   Potassium 3.6 3.5 - 5.1 mmol/L   Chloride 101 98 - 111 mmol/L   CO2 22 22 - 32 mmol/L   Glucose, Bld 82 70 - 99 mg/dL   BUN 10 6 - 20 mg/dL   Creatinine, Ser 0.68 0.44 - 1.00 mg/dL   Calcium 9.1 8.9 - 10.3 mg/dL   Total Protein 6.5 6.5 - 8.1 g/dL   Albumin 3.6 3.5 - 5.0 g/dL   AST 15 15 - 41 U/L   ALT 10 0 - 44 U/L   Alkaline Phosphatase 26 (L) 38 - 126 U/L   Total Bilirubin 0.7 0.3 - 1.2 mg/dL   GFR calc non Af Amer >60 >60 mL/min   GFR calc Af Amer >60 >60 mL/min   Anion gap 10 5 - 15    MAU Course  Procedures  MDM -N/V in pregnancy -weight history           08/18/19   65.3kg           09/13/19     60.8kg           09/28/19   60.8kg           10/02/19   63.5kg -UA: cloudy/trace leuks/rare bacteria, sending urine for  culture -CBC w/Diff: WNL for pregnancy -CMP: no abnormalities requiring treatment -EKG: normal -1L LR + 40mg  Protonix + 8mg  Zofran +10mg  Reglan given, pt reports NV now significantly improved -FHT 141 -pt wearing Scopolamine patch -pt able to urinate after fluids -PO challenge successful -after fluids and medication, HA now 2/10 -consulted with Dr. Vevelyn Francois, will optimize medication regimen with 8mg  scheduled Zofran, Protonix daily, Reglan 10mg  scheduled, continuation of Scopolamine patch q3days and continuation of Diclegis 2 tabs nightly -pt discharged to home in stable condition  Orders Placed This Encounter  Procedures  . Culture, OB Urine    Standing Status:   Standing    Number of Occurrences:   1  . Urinalysis, Routine w reflex microscopic    Standing Status:   Standing    Number of Occurrences:   1  . CBC with Differential/Platelet    Standing Status:   Standing    Number of Occurrences:   1  . Comprehensive metabolic panel    Standing Status:   Standing    Number of Occurrences:   1  . EKG 12-Lead    Standing Status:   Standing    Number of Occurrences:   1    Order Specific Question:   Reason for Exam    Answer:  QT prolongation medication  . EKG 12-Lead    Standing Status:   Standing    Number of Occurrences:   1  . Insert peripheral IV    Standing Status:   Standing    Number of Occurrences:   1  . Discharge patient    Order Specific Question:   Discharge disposition    Answer:   01-Home or Self Care [1]    Order Specific Question:   Discharge patient date    Answer:   10/02/2019   Meds ordered this encounter  Medications  . lactated ringers bolus 1,000 mL  . metoCLOPramide (REGLAN) injection 10 mg  . ondansetron (ZOFRAN) 8 mg in sodium chloride 0.9 % 50 mL IVPB  . pantoprazole (PROTONIX) injection 40 mg  . ondansetron (ZOFRAN-ODT) 4 MG disintegrating tablet    Sig: Take 2 tablets (8 mg total) by mouth every 8 (eight) hours as needed.    Dispense:  30  tablet    Refill:  2    Order Specific Question:   Supervising Provider    Answer:   Verita Schneiders A [id number]  . Doxylamine-Pyridoxine (DICLEGIS) 10-10 MG TBEC    Sig: Take 2 tablets by mouth at bedtime.    Dispense:  60 tablet    Refill:  0    Order Specific Question:   Supervising Provider    Answer:   Verita Schneiders A [[id number]]    Assessment and Plan   1. Hyperemesis   2. Supervision of normal first pregnancy, antepartum   3. [redacted] weeks gestation of pregnancy     Allergies as of 10/02/2019   No Known Allergies     Medication List    STOP taking these medications   promethazine 12.5 MG tablet Commonly known as: PHENERGAN     TAKE these medications   albuterol (5 MG/ML) 0.5% Nebu Commonly known as: VENTOLIN Inhale into the lungs.   albuterol (2.5 MG/3ML) 0.083% nebulizer solution Commonly known as: PROVENTIL Take 2.5 mg by nebulization every 6 (six) hours as needed for wheezing or shortness of breath.   albuterol 108 (90 Base) MCG/ACT inhaler Commonly known as: VENTOLIN HFA Inhale 1-2 puffs into the lungs every 6 (six) hours as needed for wheezing or shortness of breath.   Doxylamine-Pyridoxine 10-10 MG Tbec Commonly known as: Diclegis Take 2 tablets by mouth at bedtime.   glycopyrrolate 1 MG tablet Commonly known as: Robinul Take 1 tablet (1 mg total) by mouth 3 (three) times daily.   ondansetron 4 MG disintegrating tablet Commonly known as: ZOFRAN-ODT Take 2 tablets (8 mg total) by mouth every 8 (eight) hours as needed. What changed:   how much to take  when to take this   pantoprazole 20 MG tablet Commonly known as: PROTONIX Take 1 tablet (20 mg total) by mouth daily.   scopolamine 1 MG/3DAYS Commonly known as: TRANSDERM-SCOP Place 1 patch (1.5 mg total) onto the skin every 3 (three) days.       -will call with culture results, if positive -pt denies smoking marijuana or using marijuana products -pt advised to take medications around the clock  and not to stop taking if feeling better -RX Diclegis per pt request, pt reports she has all other medications at home -Zofran increased to 8mg  q8hr, pt reports sufficient amount of 4mg  tabelts at home, declines refill at this time -added Reglan 10mg  q8hr -continue Scopolamine, Protonix, Diclegis -d/c Phenergan, Reglan -discussed nonpharmacologic and pharmacologic treatments of N/V -discussed normal expectations for N/V  in pregnancy -pt discharged to home in stable condition   Gerrie Nordmann Zo Loudon 10/02/2019, 9:40 PM

## 2019-10-02 NOTE — MAU Note (Signed)
Presents with c/o nonstop vomiting, can't keep anything down.  Reports  vomited x6 in last 24 hours.  States also has a migraine H/A, hasn't taken any meds, "I don't know what to take.  Reports dizziness.  Denies VB.

## 2019-10-02 NOTE — Discharge Instructions (Signed)
Hyperemesis Gravidarum Hyperemesis gravidarum is a severe form of nausea and vomiting that happens during pregnancy. Hyperemesis is worse than morning sickness. It may cause you to have nausea or vomiting all day for many days. It may keep you from eating and drinking enough food and liquids, which can lead to dehydration, malnutrition, and weight loss. Hyperemesis usually occurs during the first half (the first 20 weeks) of pregnancy. It often goes away once a woman is in her second half of pregnancy. However, sometimes hyperemesis continues through an entire pregnancy. What are the causes? The cause of this condition is not known. It may be related to changes in chemicals (hormones) in the body during pregnancy, such as the high level of pregnancy hormone (human chorionic gonadotropin) or the increase in the female sex hormone (estrogen). What are the signs or symptoms? Symptoms of this condition include:  Nausea that does not go away.  Vomiting that does not allow you to keep any food down.  Weight loss.  Body fluid loss (dehydration).  Having no desire to eat, or not liking food that you have previously enjoyed. How is this diagnosed? This condition may be diagnosed based on:  A physical exam.  Your medical history.  Your symptoms.  Blood tests.  Urine tests. How is this treated? This condition is managed by controlling symptoms. This may include:  Following an eating plan. This can help lessen nausea and vomiting.  Taking prescription medicines. An eating plan and medicines are often used together to help control symptoms. If medicines do not help relieve nausea and vomiting, you may need to receive fluids through an IV at the hospital. Follow these instructions at home: Eating and drinking   Avoid the following: ? Drinking fluids with meals. Try not to drink anything during the 30 minutes before and after your meals. ? Drinking more than 1 cup of fluid at a  time. ? Eating foods that trigger your symptoms. These may include spicy foods, coffee, high-fat foods, very sweet foods, and acidic foods. ? Skipping meals. Nausea can be more intense on an empty stomach. If you cannot tolerate food, do not force it. Try sucking on ice chips or other frozen items and make up for missed calories later. ? Lying down within 2 hours after eating. ? Being exposed to environmental triggers. These may include food smells, smoky rooms, closed spaces, rooms with strong smells, warm or humid places, overly loud and noisy rooms, and rooms with motion or flickering lights. Try eating meals in a well-ventilated area that is free of strong smells. ? Quick and sudden changes in your movement. ? Taking iron pills and multivitamins that contain iron. If you take prescription iron pills, do not stop taking them unless your health care provider approves. ? Preparing food. The smell of food can spoil your appetite or trigger nausea.  To help relieve your symptoms: ? Listen to your body. Everyone is different and has different preferences. Find what works best for you. ? Eat and drink slowly. ? Eat 5-6 small meals daily instead of 3 large meals. Eating small meals and snacks can help you avoid an empty stomach. ? In the morning, before getting out of bed, eat a couple of crackers to avoid moving around on an empty stomach. ? Try eating starchy foods as these are usually tolerated well. Examples include cereal, toast, bread, potatoes, pasta, rice, and pretzels. ? Include at least 1 serving of protein with your meals and snacks. Protein options include   lean meats, poultry, seafood, beans, nuts, nut butters, eggs, cheese, and yogurt. ? Try eating a protein-rich snack before bed. Examples of a protein-rick snack include cheese and crackers or a peanut butter sandwich made with 1 slice of whole-wheat bread and 1 tsp (5 g) of peanut butter. ? Eat or suck on things that have ginger in them.  It may help relieve nausea. Add  tsp ground ginger to hot tea or choose ginger tea. ? Try drinking 100% fruit juice or an electrolyte drink. An electrolyte drink contains sodium, potassium, and chloride. ? Drink fluids that are cold, clear, and carbonated or sour. Examples include lemonade, ginger ale, lemon-lime soda, ice water, and sparkling water. ? Brush your teeth or use a mouth rinse after meals. ? Talk with your health care provider about starting a supplement of vitamin B6. General instructions  Take over-the-counter and prescription medicines only as told by your health care provider.  Follow instructions from your health care provider about eating or drinking restrictions.  Continue to take your prenatal vitamins as told by your health care provider. If you are having trouble taking your prenatal vitamins, talk with your health care provider about different options.  Keep all follow-up and pre-birth (prenatal) visits as told by your health care provider. This is important. Contact a health care provider if:  You have pain in your abdomen.  You have a severe headache.  You have vision problems.  You are losing weight.  You feel weak or dizzy. Get help right away if:  You cannot drink fluids without vomiting.  You vomit blood.  You have constant nausea and vomiting.  You are very weak.  You faint.  You have a fever and your symptoms suddenly get worse. Summary  Hyperemesis gravidarum is a severe form of nausea and vomiting that happens during pregnancy.  Making some changes to your eating habits may help relieve nausea and vomiting.  This condition may be managed with medicine.  If medicines do not help relieve nausea and vomiting, you may need to receive fluids through an IV at the hospital. This information is not intended to replace advice given to you by your health care provider. Make sure you discuss any questions you have with your health care  provider. Document Revised: 06/14/2017 Document Reviewed: 01/22/2016 Elsevier Patient Education  2020 [employer]  

## 2019-10-03 ENCOUNTER — Telehealth: Payer: Self-pay

## 2019-10-03 LAB — CULTURE, OB URINE: Culture: NO GROWTH

## 2019-10-03 NOTE — Telephone Encounter (Signed)
Called pt regarding appt for IV phernergan. Pt made aware that she is scheduled for May 3rd at 11am at [facility] [place] pt to go to admitting when she arrives for her appt. Understanding was voiced.  Dedra Matsuo l Deandra Gadson, CMA

## 2019-10-05 ENCOUNTER — Inpatient Hospital Stay (HOSPITAL_COMMUNITY)
Admission: AD | Admit: 2019-10-05 | Discharge: 2019-10-05 | Disposition: A | Discharge status: Home / Self Care | Payer: Medicaid Other | Attending: Obstetrics & Gynecology | Admitting: Obstetrics & Gynecology

## 2019-10-05 ENCOUNTER — Telehealth: Payer: Self-pay

## 2019-10-05 ENCOUNTER — Other Ambulatory Visit: Payer: Self-pay

## 2019-10-05 ENCOUNTER — Encounter (HOSPITAL_COMMUNITY): Payer: Self-pay | Admitting: Obstetrics & Gynecology

## 2019-10-05 DIAGNOSIS — Z3A13 13 weeks gestation of pregnancy: Secondary | ICD-10-CM | POA: Diagnosis not present

## 2019-10-05 DIAGNOSIS — R519 Headache, unspecified: Secondary | ICD-10-CM | POA: Diagnosis not present

## 2019-10-05 DIAGNOSIS — O210 Mild hyperemesis gravidarum: Secondary | ICD-10-CM | POA: Insufficient documentation

## 2019-10-05 DIAGNOSIS — O26891 Other specified pregnancy related conditions, first trimester: Secondary | ICD-10-CM | POA: Insufficient documentation

## 2019-10-05 DIAGNOSIS — Z3400 Encounter for supervision of normal first pregnancy, unspecified trimester: Secondary | ICD-10-CM

## 2019-10-05 LAB — CBC
HCT: 32.5 % — ABNORMAL LOW (ref 36.0–46.0)
Hemoglobin: 10.6 g/dL — ABNORMAL LOW (ref 12.0–15.0)
MCH: 30 pg (ref 26.0–34.0)
MCHC: 32.6 g/dL (ref 30.0–36.0)
MCV: 92.1 fL (ref 80.0–100.0)
Platelets: 222 10*3/uL (ref 150–400)
RBC: 3.53 MIL/uL — ABNORMAL LOW (ref 3.87–5.11)
RDW: 12.7 % (ref 11.5–15.5)
WBC: 8.4 10*3/uL (ref 4.0–10.5)
nRBC: 0 % (ref 0.0–0.2)

## 2019-10-05 LAB — COMPREHENSIVE METABOLIC PANEL
ALT: 10 U/L (ref 0–44)
AST: 14 U/L — ABNORMAL LOW (ref 15–41)
Albumin: 3.2 g/dL — ABNORMAL LOW (ref 3.5–5.0)
Alkaline Phosphatase: 25 U/L — ABNORMAL LOW (ref 38–126)
Anion gap: 7 (ref 5–15)
BUN: 7 mg/dL (ref 6–20)
CO2: 23 mmol/L (ref 22–32)
Calcium: 8.9 mg/dL (ref 8.9–10.3)
Chloride: 106 mmol/L (ref 98–111)
Creatinine, Ser: 0.76 mg/dL (ref 0.44–1.00)
GFR calc Af Amer: >60 mL/min (ref >60)
GFR calc non Af Amer: >60 mL/min (ref >60)
Glucose, Bld: 87 mg/dL (ref 70–99)
Potassium: 4 mmol/L (ref 3.5–5.1)
Sodium: 136 mmol/L (ref 135–145)
Total Bilirubin: 0.7 mg/dL (ref 0.3–1.2)
Total Protein: 5.9 g/dL — ABNORMAL LOW (ref 6.5–8.1)

## 2019-10-05 LAB — URINALYSIS, ROUTINE W REFLEX MICROSCOPIC
Bilirubin Urine: NEGATIVE
Glucose, UA: NEGATIVE mg/dL
Hgb urine dipstick: NEGATIVE
Ketones, ur: NEGATIVE mg/dL
Leukocytes,Ua: NEGATIVE
Nitrite: NEGATIVE
Protein, ur: NEGATIVE mg/dL
Specific Gravity, Urine: 1.023 (ref 1.005–1.030)
pH: 7 (ref 5.0–8.0)

## 2019-10-05 MED ORDER — SODIUM CHLORIDE 0.9 % IV SOLN
25.0000 mg | INTRAVENOUS | Status: AC
  Administered 2019-10-05: 25 mg via INTRAVENOUS
  Filled 2019-10-05: qty 1

## 2019-10-05 MED ORDER — METOCLOPRAMIDE HCL 5 MG/ML IJ SOLN
10.0000 mg | Freq: Once | INTRAMUSCULAR | Status: AC
  Administered 2019-10-05: 10 mg via INTRAVENOUS
  Filled 2019-10-05: qty 2

## 2019-10-05 MED ORDER — DEXAMETHASONE SODIUM PHOSPHATE 10 MG/ML IJ SOLN
10.0000 mg | Freq: Once | INTRAMUSCULAR | Status: AC
  Administered 2019-10-05: 10 mg via INTRAVENOUS
  Filled 2019-10-05: qty 1

## 2019-10-05 NOTE — MAU Provider Note (Signed)
Patient Vicki Harper is a 24 y.o.  G1P0 at [redacted]w[redacted]d here with complaints of on-going nausea and vomiting. She denies vaginal bleeding, vaginal discharge, abdominal pain, fever, diarrhea, SOB, chest pain.   She also complains of a headache, which is similar to her migraines.   Her mom, boyfriend and sister are her main supports.   History     CSN: [id number]  Arrival date and time: 10/05/19 1507   First Provider Initiated Contact with Patient 10/05/19 1549      Chief Complaint  Patient presents with  . Headache  . Abdominal Pain  . Morning Sickness   Headache  This is a new problem. The current episode started in the past 7 days. The problem has been unchanged. The pain is located in the frontal region. The pain does not radiate. The pain quality is similar to prior headaches. Associated symptoms include vomiting. Pertinent negatives include no blurred vision, photophobia or weakness.    OB History    Gravida  1   Para      Term      Preterm      AB      Living        SAB      TAB      Ectopic      Multiple      Live Births              Past Medical History:  Diagnosis Date  . Asthma   . Back pain   . Headache     Past Surgical History:  Procedure Laterality Date  . NO PAST SURGERIES      Family History  Problem Relation Age of Onset  . Migraines Mother     Social History   Tobacco Use  . Smoking status: Never Smoker  . Smokeless tobacco: Never Used  Substance Use Topics  . Alcohol use: No    Alcohol/week: 0.0 standard drinks  . Drug use: No    Allergies: No Known Allergies  Medications Prior to Admission  Medication Sig Dispense Refill Last Dose  . albuterol (PROVENTIL HFA;VENTOLIN HFA) 108 (90 BASE) MCG/ACT inhaler Inhale 1-2 puffs into the lungs every 6 (six) hours as needed for wheezing or shortness of breath. 1 Inhaler 0 Past Week at Unknown time  . albuterol (PROVENTIL) (2.5 MG/3ML) 0.083% nebulizer solution Take 2.5 mg by  nebulization every 6 (six) hours as needed for wheezing or shortness of breath.   Past Week at Unknown time  . albuterol (VENTOLIN) (5 MG/ML) 0.5% NEBU Inhale into the lungs.   Past Week at Unknown time  . Doxylamine-Pyridoxine (DICLEGIS) 10-10 MG TBEC Take 2 tablets by mouth at bedtime. 60 tablet 0 10/05/2019 at Unknown time  . glycopyrrolate (ROBINUL) 1 MG tablet Take 1 tablet (1 mg total) by mouth 3 (three) times daily. 90 tablet 0 10/05/2019 at Unknown time  . ondansetron (ZOFRAN-ODT) 4 MG disintegrating tablet Take 2 tablets (8 mg total) by mouth every 8 (eight) hours as needed. 30 tablet 2 10/05/2019 at Unknown time  . pantoprazole (PROTONIX) 20 MG tablet Take 1 tablet (20 mg total) by mouth daily. 30 tablet 2 10/05/2019 at Unknown time  . scopolamine (TRANSDERM-SCOP) 1 MG/3DAYS Place 1 patch (1.5 mg total) onto the skin every 3 (three) days. 10 patch 1 10/05/2019 at Unknown time    Review of Systems  Constitutional: Negative for fatigue.  HENT: Negative.   Eyes: Negative for blurred vision and photophobia.  Gastrointestinal: Positive  for vomiting.  Genitourinary: Negative.  Negative for vaginal bleeding, vaginal discharge and vaginal pain.  Musculoskeletal: Negative.   Neurological: Positive for headaches. Negative for weakness.   Physical Exam   Blood pressure (!) 114/48, pulse 64, temperature 98.5 F (36.9 C), resp. rate 16, weight 62.9 kg, last menstrual period 07/05/2019, SpO2 100 %.  Physical Exam  Constitutional: She appears well-developed.  HENT:  Head: Normocephalic.  Respiratory: Effort normal.  GI: Soft.  Musculoskeletal:        General: Normal range of motion.     Cervical back: Normal range of motion.  Neurological: She is alert.  Skin: Skin is warm.  Psychiatric: She has a normal mood and affect.    MAU Course  Procedures  MDM UA-normal -CBC and CMP are normal, no signs of electrolyte imbalance.  -IV LR and phenergan given She has no had any vomiting while  in MAU; she tolerated PO challenge.   Assessment and Plan   1. Hyperemesis gravidarum, antepartum   2. Supervision of normal first pregnancy, antepartum    2. Patient stable for discharge with instructions to keep her anti-emetic routine and keep infusion appt for next week.   Elias Else Ameisha Mcclellan 10/05/2019, 3:59 PM

## 2019-10-05 NOTE — Telephone Encounter (Signed)
Pt called stating she is unable to keep anything down. Pt states she vomited 3 times today and she also has a migraine. Pt states she was unable to keep her medications down. Advised pt to go to [facility] at [facility]. Understanding was voiced.  Alysiana Ethridge l Jevaughn Degollado, CMA

## 2019-10-05 NOTE — MAU Note (Signed)
.   Vicki Harper is a 24 y.o. at [redacted]w[redacted]d here in MAU reporting: headache, N/V and pain in her right lower abdomen. States she starts IV transfusions 3 times a week on Monday LMP: 07/05/19 Onset of complaint: ongoing Pain score: 5/10 Abdomen  10/10 head Vitals:   10/05/19 1529 10/05/19 1533  BP: (!) 114/48   Pulse: 64   Resp: 16   Temp: 98.5 F (36.9 C)   SpO2:  100%     FHT:145 Lab orders placed from triage: UA

## 2019-10-05 NOTE — Discharge Instructions (Signed)
Hyperemesis Gravidarum Hyperemesis gravidarum is a severe form of nausea and vomiting that happens during pregnancy. Hyperemesis is worse than morning sickness. It may cause you to have nausea or vomiting all day for many days. It may keep you from eating and drinking enough food and liquids, which can lead to dehydration, malnutrition, and weight loss. Hyperemesis usually occurs during the first half (the first 20 weeks) of pregnancy. It often goes away once a woman is in her second half of pregnancy. However, sometimes hyperemesis continues through an entire pregnancy. What are the causes? The cause of this condition is not known. It may be related to changes in chemicals (hormones) in the body during pregnancy, such as the high level of pregnancy hormone (human chorionic gonadotropin) or the increase in the female sex hormone (estrogen). What are the signs or symptoms? Symptoms of this condition include:  Nausea that does not go away.  Vomiting that does not allow you to keep any food down.  Weight loss.  Body fluid loss (dehydration).  Having no desire to eat, or not liking food that you have previously enjoyed. How is this diagnosed? This condition may be diagnosed based on:  A physical exam.  Your medical history.  Your symptoms.  Blood tests.  Urine tests. How is this treated? This condition is managed by controlling symptoms. This may include:  Following an eating plan. This can help lessen nausea and vomiting.  Taking prescription medicines. An eating plan and medicines are often used together to help control symptoms. If medicines do not help relieve nausea and vomiting, you may need to receive fluids through an IV at the hospital. Follow these instructions at home: Eating and drinking   Avoid the following: ? Drinking fluids with meals. Try not to drink anything during the 30 minutes before and after your meals. ? Drinking more than 1 cup of fluid at a  time. ? Eating foods that trigger your symptoms. These may include spicy foods, coffee, high-fat foods, very sweet foods, and acidic foods. ? Skipping meals. Nausea can be more intense on an empty stomach. If you cannot tolerate food, do not force it. Try sucking on ice chips or other frozen items and make up for missed calories later. ? Lying down within 2 hours after eating. ? Being exposed to environmental triggers. These may include food smells, smoky rooms, closed spaces, rooms with strong smells, warm or humid places, overly loud and noisy rooms, and rooms with motion or flickering lights. Try eating meals in a well-ventilated area that is free of strong smells. ? Quick and sudden changes in your movement. ? Taking iron pills and multivitamins that contain iron. If you take prescription iron pills, do not stop taking them unless your health care provider approves. ? Preparing food. The smell of food can spoil your appetite or trigger nausea.  To help relieve your symptoms: ? Listen to your body. Everyone is different and has different preferences. Find what works best for you. ? Eat and drink slowly. ? Eat 5-6 small meals daily instead of 3 large meals. Eating small meals and snacks can help you avoid an empty stomach. ? In the morning, before getting out of bed, eat a couple of crackers to avoid moving around on an empty stomach. ? Try eating starchy foods as these are usually tolerated well. Examples include cereal, toast, bread, potatoes, pasta, rice, and pretzels. ? Include at least 1 serving of protein with your meals and snacks. Protein options include   lean meats, poultry, seafood, beans, nuts, nut butters, eggs, cheese, and yogurt. ? Try eating a protein-rich snack before bed. Examples of a protein-rick snack include cheese and crackers or a peanut butter sandwich made with 1 slice of whole-wheat bread and 1 tsp (5 g) of peanut butter. ? Eat or suck on things that have ginger in them.  It may help relieve nausea. Add  tsp ground ginger to hot tea or choose ginger tea. ? Try drinking 100% fruit juice or an electrolyte drink. An electrolyte drink contains sodium, potassium, and chloride. ? Drink fluids that are cold, clear, and carbonated or sour. Examples include lemonade, ginger ale, lemon-lime soda, ice water, and sparkling water. ? Brush your teeth or use a mouth rinse after meals. ? Talk with your health care provider about starting a supplement of vitamin B6. General instructions  Take over-the-counter and prescription medicines only as told by your health care provider.  Follow instructions from your health care provider about eating or drinking restrictions.  Continue to take your prenatal vitamins as told by your health care provider. If you are having trouble taking your prenatal vitamins, talk with your health care provider about different options.  Keep all follow-up and pre-birth (prenatal) visits as told by your health care provider. This is important. Contact a health care provider if:  You have pain in your abdomen.  You have a severe headache.  You have vision problems.  You are losing weight.  You feel weak or dizzy. Get help right away if:  You cannot drink fluids without vomiting.  You vomit blood.  You have constant nausea and vomiting.  You are very weak.  You faint.  You have a fever and your symptoms suddenly get worse. Summary  Hyperemesis gravidarum is a severe form of nausea and vomiting that happens during pregnancy.  Making some changes to your eating habits may help relieve nausea and vomiting.  This condition may be managed with medicine.  If medicines do not help relieve nausea and vomiting, you may need to receive fluids through an IV at the hospital. This information is not intended to replace advice given to you by your health care provider. Make sure you discuss any questions you have with your health care  provider. Document Revised: 06/14/2017 Document Reviewed: 01/22/2016 Elsevier Patient Education  2020 [employer]  

## 2019-10-09 ENCOUNTER — Encounter (HOSPITAL_COMMUNITY)
Admission: RE | Admit: 2019-10-09 | Discharge: 2019-10-09 | Disposition: A | Discharge status: Home / Self Care | Payer: Medicaid Other | Source: Ambulatory Visit | Attending: Family Medicine | Admitting: Family Medicine

## 2019-10-09 ENCOUNTER — Other Ambulatory Visit: Payer: Self-pay

## 2019-10-09 DIAGNOSIS — Z3400 Encounter for supervision of normal first pregnancy, unspecified trimester: Secondary | ICD-10-CM | POA: Insufficient documentation

## 2019-10-09 DIAGNOSIS — O210 Mild hyperemesis gravidarum: Secondary | ICD-10-CM | POA: Insufficient documentation

## 2019-10-09 MED ORDER — PROMETHAZINE HCL 25 MG/ML IJ SOLN
25.0000 mg | INTRAVENOUS | Status: DC
  Administered 2019-10-09: 25 mg via INTRAVENOUS
  Filled 2019-10-09: qty 1

## 2019-10-10 ENCOUNTER — Ambulatory Visit (INDEPENDENT_AMBULATORY_CARE_PROVIDER_SITE_OTHER): Payer: Medicaid Other | Admitting: Medical

## 2019-10-10 ENCOUNTER — Encounter: Payer: Self-pay | Admitting: Medical

## 2019-10-10 ENCOUNTER — Other Ambulatory Visit: Payer: Self-pay

## 2019-10-10 VITALS — BP 90/65 | HR 86 | Wt 142.0 lb

## 2019-10-10 DIAGNOSIS — Z3400 Encounter for supervision of normal first pregnancy, unspecified trimester: Secondary | ICD-10-CM

## 2019-10-10 DIAGNOSIS — O99511 Diseases of the respiratory system complicating pregnancy, first trimester: Secondary | ICD-10-CM

## 2019-10-10 DIAGNOSIS — J45909 Unspecified asthma, uncomplicated: Secondary | ICD-10-CM | POA: Insufficient documentation

## 2019-10-10 DIAGNOSIS — R87612 Low grade squamous intraepithelial lesion on cytologic smear of cervix (LGSIL): Secondary | ICD-10-CM

## 2019-10-10 DIAGNOSIS — O210 Mild hyperemesis gravidarum: Secondary | ICD-10-CM

## 2019-10-10 MED ORDER — SCOPOLAMINE 1 MG/3DAYS TD PT72: 1.0000 | MEDICATED_PATCH | TRANSDERMAL | 1 refills | Status: DC

## 2019-10-10 NOTE — Patient Instructions (Addendum)

## 2019-10-10 NOTE — Progress Notes (Signed)
Patient complaining of sinus pressure and congestion. Kathrene Alu RN

## 2019-10-10 NOTE — Progress Notes (Signed)
   PRENATAL VISIT NOTE  Subjective:  Vicki Harper is a 24 y.o. G1P0 at [redacted]w[redacted]d being seen today for ongoing prenatal care.  She is currently monitored for the following issues for this low-risk pregnancy and has Chronic pain of left knee; Supervision of normal first pregnancy, antepartum; Hyperemesis gravidarum; LGSIL on Pap smear of cervix; and Asthma affecting pregnancy in first trimester on their problem list.  Patient reports nausea.  Contractions: Not present. Vag. Bleeding: None.  Movement: Absent. Denies leaking of fluid.   The following portions of the patient's history were reviewed and updated as appropriate: allergies, current medications, past family history, past medical history, past social history, past surgical history and problem list.   Objective:   Vitals:   10/10/19 1012  BP: 90/65  Pulse: 86  Weight: 142 lb (64.4 kg)    Fetal Status: Fetal Heart Rate (bpm): 143   Movement: Absent     General:  Alert, oriented and cooperative. Patient is in no acute distress.  Skin: Skin is warm and dry. No rash noted.   Cardiovascular: Normal heart rate noted  Respiratory: Normal respiratory effort, no problems with respiration noted  Abdomen: Soft, gravid, appropriate for gestational age.  Pain/Pressure: Present     Pelvic: Cervical exam deferred        Extremities: Normal range of motion.  Edema: None  Mental Status: Normal mood and affect. Normal behavior. Normal judgment and thought content.   Assessment and Plan:  Pregnancy: G1P0 at [redacted]w[redacted]d 1. Supervision of normal first pregnancy, antepartum - Doing well - Discussed results from NOB - Planning to use [place] today   2. Hyperemesis gravidarum - scopolamine (TRANSDERM-SCOP) 1 MG/3DAYS; Place 1 patch (1.5 mg total) onto the skin every 3 (three) days.  Dispense: 10 patch; Refill: 1 - Still taking Diclegis qhs, Zofran PRN and Robinul - Will increase Robinul and add Zyrtec or Claritin  - No emesis in 1 week,  nausea persistent - TWG -5# 3. LGSIL on Pap smear of cervix - Repeat in one year, patient aware   4. Asthma affecting pregnancy in first trimester - PRN inhaler use, slightly increased due to seasonal allergies/congestion  Preterm labor/ second trimester warning symptoms and general obstetric precautions including but not limited to vaginal bleeding, contractions, leaking of fluid and fetal movement were reviewed in detail with the patient. Please refer to After Visit Summary for other counseling recommendations.   Return in about 8 weeks (around 12/05/2019) for LOB, Babyscipts, In-Person.  Future Appointments  Date Time Provider Mabel  10/11/2019 10:00 AM [facility] ROOM 3 [facility] None  10/13/2019 10:00 AM [facility] ROOM 3 [facility] None  11/15/2019  9:00 AM [facility] US1 [facility]    Kerry Hough, PA-C

## 2019-10-11 ENCOUNTER — Encounter (HOSPITAL_COMMUNITY)
Admission: RE | Admit: 2019-10-11 | Discharge: 2019-10-11 | Disposition: A | Discharge status: Home / Self Care | Payer: Medicaid Other | Source: Ambulatory Visit | Attending: Family Medicine | Admitting: Family Medicine

## 2019-10-11 DIAGNOSIS — O210 Mild hyperemesis gravidarum: Secondary | ICD-10-CM

## 2019-10-11 DIAGNOSIS — Z3400 Encounter for supervision of normal first pregnancy, unspecified trimester: Secondary | ICD-10-CM

## 2019-10-11 MED ORDER — PROMETHAZINE HCL 25 MG/ML IJ SOLN
25.0000 mg | INTRAVENOUS | Status: DC
  Administered 2019-10-11: 25 mg via INTRAVENOUS
  Filled 2019-10-11: qty 1

## 2019-10-13 ENCOUNTER — Encounter (HOSPITAL_COMMUNITY)
Admission: RE | Admit: 2019-10-13 | Discharge: 2019-10-13 | Disposition: A | Discharge status: Home / Self Care | Payer: Medicaid Other | Source: Ambulatory Visit | Attending: Family Medicine | Admitting: Family Medicine

## 2019-10-13 ENCOUNTER — Other Ambulatory Visit: Payer: Self-pay

## 2019-10-13 DIAGNOSIS — O210 Mild hyperemesis gravidarum: Secondary | ICD-10-CM

## 2019-10-13 DIAGNOSIS — Z3400 Encounter for supervision of normal first pregnancy, unspecified trimester: Secondary | ICD-10-CM

## 2019-10-13 MED ORDER — PROMETHAZINE HCL 25 MG/ML IJ SOLN
25.0000 mg | INTRAVENOUS | Status: DC
  Administered 2019-10-13: 25 mg via INTRAVENOUS
  Filled 2019-10-13: qty 1

## 2019-10-16 ENCOUNTER — Ambulatory Visit (HOSPITAL_COMMUNITY)
Admission: RE | Admit: 2019-10-16 | Discharge: 2019-10-16 | Disposition: A | Discharge status: Home / Self Care | Payer: Medicaid Other | Source: Ambulatory Visit | Attending: Family Medicine | Admitting: Family Medicine

## 2019-10-16 DIAGNOSIS — Z3400 Encounter for supervision of normal first pregnancy, unspecified trimester: Secondary | ICD-10-CM

## 2019-10-16 DIAGNOSIS — O210 Mild hyperemesis gravidarum: Secondary | ICD-10-CM

## 2019-10-16 MED ORDER — PROMETHAZINE HCL 25 MG/ML IJ SOLN
25.0000 mg | INTRAVENOUS | Status: DC
  Administered 2019-10-16: 25 mg via INTRAVENOUS
  Filled 2019-10-16: qty 1

## 2019-10-17 ENCOUNTER — Other Ambulatory Visit: Payer: Self-pay

## 2019-10-17 ENCOUNTER — Telehealth: Payer: Self-pay

## 2019-10-17 NOTE — Telephone Encounter (Signed)
Called pt to discuss Panorama results. Pt made aware that her Lucina Mellow is normal and she is having a girl. Understanding was voiced. Amritpal Shropshire l Jarrin Staley, CMA

## 2019-10-18 ENCOUNTER — Ambulatory Visit (HOSPITAL_COMMUNITY)
Admission: RE | Admit: 2019-10-18 | Discharge: 2019-10-18 | Disposition: A | Discharge status: Home / Self Care | Payer: Medicaid Other | Source: Ambulatory Visit | Attending: Family Medicine | Admitting: Family Medicine

## 2019-10-18 ENCOUNTER — Other Ambulatory Visit: Payer: Self-pay

## 2019-10-18 DIAGNOSIS — O210 Mild hyperemesis gravidarum: Secondary | ICD-10-CM

## 2019-10-18 DIAGNOSIS — Z3400 Encounter for supervision of normal first pregnancy, unspecified trimester: Secondary | ICD-10-CM

## 2019-10-18 MED ORDER — PROMETHAZINE HCL 25 MG/ML IJ SOLN
25.0000 mg | INTRAVENOUS | Status: DC
  Administered 2019-10-18: 25 mg via INTRAVENOUS
  Filled 2019-10-18: qty 1

## 2019-10-20 ENCOUNTER — Other Ambulatory Visit: Payer: Self-pay

## 2019-10-20 ENCOUNTER — Encounter (HOSPITAL_COMMUNITY)
Admission: RE | Admit: 2019-10-20 | Discharge: 2019-10-20 | Disposition: A | Discharge status: Home / Self Care | Payer: Medicaid Other | Source: Ambulatory Visit | Attending: Family Medicine | Admitting: Family Medicine

## 2019-10-20 DIAGNOSIS — Z3400 Encounter for supervision of normal first pregnancy, unspecified trimester: Secondary | ICD-10-CM

## 2019-10-20 DIAGNOSIS — O210 Mild hyperemesis gravidarum: Secondary | ICD-10-CM | POA: Diagnosis not present

## 2019-10-20 MED ORDER — PROMETHAZINE HCL 25 MG/ML IJ SOLN
25.0000 mg | INTRAVENOUS | Status: DC
  Administered 2019-10-20: 25 mg via INTRAVENOUS
  Filled 2019-10-20: qty 1

## 2019-10-23 ENCOUNTER — Other Ambulatory Visit: Payer: Self-pay

## 2019-10-23 ENCOUNTER — Encounter (HOSPITAL_COMMUNITY)
Admission: RE | Admit: 2019-10-23 | Discharge: 2019-10-23 | Disposition: A | Discharge status: Home / Self Care | Payer: Medicaid Other | Source: Ambulatory Visit | Attending: Family Medicine | Admitting: Family Medicine

## 2019-10-23 DIAGNOSIS — O210 Mild hyperemesis gravidarum: Secondary | ICD-10-CM | POA: Diagnosis not present

## 2019-10-23 DIAGNOSIS — Z3400 Encounter for supervision of normal first pregnancy, unspecified trimester: Secondary | ICD-10-CM

## 2019-10-23 MED ORDER — PROMETHAZINE HCL 25 MG/ML IJ SOLN
25.0000 mg | INTRAVENOUS | Status: DC
  Administered 2019-10-23: 25 mg via INTRAVENOUS
  Filled 2019-10-23: qty 1

## 2019-10-25 ENCOUNTER — Encounter (HOSPITAL_COMMUNITY)
Admission: RE | Admit: 2019-10-25 | Discharge: 2019-10-25 | Disposition: A | Discharge status: Home / Self Care | Payer: Medicaid Other | Source: Ambulatory Visit | Attending: Family Medicine | Admitting: Family Medicine

## 2019-10-25 ENCOUNTER — Other Ambulatory Visit: Payer: Self-pay

## 2019-10-25 DIAGNOSIS — O210 Mild hyperemesis gravidarum: Secondary | ICD-10-CM | POA: Diagnosis not present

## 2019-10-25 DIAGNOSIS — Z3400 Encounter for supervision of normal first pregnancy, unspecified trimester: Secondary | ICD-10-CM

## 2019-10-25 MED ORDER — PROMETHAZINE HCL 25 MG/ML IJ SOLN
25.0000 mg | INTRAVENOUS | Status: DC
  Administered 2019-10-25: 25 mg via INTRAVENOUS
  Filled 2019-10-25: qty 1

## 2019-10-27 ENCOUNTER — Ambulatory Visit (HOSPITAL_COMMUNITY)
Admission: RE | Admit: 2019-10-27 | Discharge: 2019-10-27 | Disposition: A | Discharge status: Home / Self Care | Payer: Medicaid Other | Source: Ambulatory Visit | Attending: Family Medicine | Admitting: Family Medicine

## 2019-10-27 ENCOUNTER — Other Ambulatory Visit: Payer: Self-pay

## 2019-10-27 DIAGNOSIS — Z3400 Encounter for supervision of normal first pregnancy, unspecified trimester: Secondary | ICD-10-CM | POA: Diagnosis present

## 2019-10-27 DIAGNOSIS — O210 Mild hyperemesis gravidarum: Secondary | ICD-10-CM | POA: Diagnosis present

## 2019-10-27 MED ORDER — PROMETHAZINE HCL 25 MG/ML IJ SOLN
25.0000 mg | INTRAVENOUS | Status: DC
  Administered 2019-10-27: 25 mg via INTRAVENOUS
  Filled 2019-10-27: qty 1

## 2019-10-28 ENCOUNTER — Other Ambulatory Visit: Payer: Self-pay | Admitting: Student

## 2019-10-30 ENCOUNTER — Encounter (HOSPITAL_COMMUNITY)
Admission: RE | Admit: 2019-10-30 | Discharge: 2019-10-30 | Disposition: A | Discharge status: Home / Self Care | Payer: Medicaid Other | Source: Ambulatory Visit | Attending: Family Medicine | Admitting: Family Medicine

## 2019-10-30 DIAGNOSIS — Z3400 Encounter for supervision of normal first pregnancy, unspecified trimester: Secondary | ICD-10-CM

## 2019-10-30 DIAGNOSIS — O210 Mild hyperemesis gravidarum: Secondary | ICD-10-CM | POA: Diagnosis not present

## 2019-10-30 MED ORDER — PROMETHAZINE HCL 25 MG/ML IJ SOLN
25.0000 mg | INTRAVENOUS | Status: DC
  Administered 2019-10-30: 25 mg via INTRAVENOUS
  Filled 2019-10-30: qty 1

## 2019-10-31 ENCOUNTER — Encounter: Payer: Self-pay | Admitting: Advanced Practice Midwife

## 2019-10-31 ENCOUNTER — Ambulatory Visit (INDEPENDENT_AMBULATORY_CARE_PROVIDER_SITE_OTHER): Payer: Medicaid Other | Admitting: Advanced Practice Midwife

## 2019-10-31 ENCOUNTER — Other Ambulatory Visit: Payer: Self-pay

## 2019-10-31 VITALS — BP 93/56 | HR 64 | Wt 155.0 lb

## 2019-10-31 DIAGNOSIS — R1031 Right lower quadrant pain: Secondary | ICD-10-CM

## 2019-10-31 DIAGNOSIS — Z3400 Encounter for supervision of normal first pregnancy, unspecified trimester: Secondary | ICD-10-CM

## 2019-10-31 DIAGNOSIS — Z3A16 16 weeks gestation of pregnancy: Secondary | ICD-10-CM

## 2019-10-31 DIAGNOSIS — O99892 Other specified diseases and conditions complicating childbirth: Secondary | ICD-10-CM

## 2019-10-31 DIAGNOSIS — O210 Mild hyperemesis gravidarum: Secondary | ICD-10-CM

## 2019-10-31 MED ORDER — SCOPOLAMINE 1 MG/3DAYS TD PT72: 1.0000 | MEDICATED_PATCH | TRANSDERMAL | 1 refills | Status: DC

## 2019-10-31 NOTE — Progress Notes (Signed)
   PRENATAL VISIT NOTE  Subjective:  Rosenda Horwedel is a 24 y.o. G1P0 at [redacted]w[redacted]d being seen today for ongoing prenatal care.  She is currently monitored for the following issues for this low-risk pregnancy and has Chronic pain of left knee; Supervision of normal first pregnancy, antepartum; Hyperemesis gravidarum; LGSIL on Pap smear of cervix; and Asthma affecting pregnancy in first trimester on their problem list.  Patient reports nausea and improvement on her vomiting.  Contractions: Not present. Vag. Bleeding: None.  Movement: Present. Denies leaking of fluid.   The following portions of the patient's history were reviewed and updated as appropriate: allergies, current medications, past family history, past medical history, past social history, past surgical history and problem list.   Objective:   Vitals:   10/31/19 0813  BP: (!) 93/56  Pulse: 64  Weight: 155 lb (70.3 kg)    Fetal Status: Fetal Heart Rate (bpm): 140   Movement: Present     General:  Alert, oriented and cooperative. Patient is in no acute distress.  Skin: Skin is warm and dry. No rash noted.   Cardiovascular: Normal heart rate noted  Respiratory: Normal respiratory effort, no problems with respiration noted  Abdomen: Soft, gravid, appropriate for gestational age.  Pain/Pressure: Present   Abdomen soft and mildly tender RLQ, with no rebound or guarding.  Pelvic: Cervical exam deferred        Extremities: Normal range of motion.  Edema: None  Mental Status: Normal mood and affect. Normal behavior. Normal judgment and thought content.   Assessment and Plan:  Pregnancy: G1P0 at [redacted]w[redacted]d 1. Supervision of normal first pregnancy, antepartum     Discussed normal panorama with AFP today     Nausea and vomiting improved with 13lb weight gain!     Wants refill on scop patch, helps a lot     Has anatomy US coming up - AFP, Serum, Open Spina Bifida  2. RLQ abdominal pain     Has had it for several days, mostly when getting  up, c/w RLP     Will check CBC but doubt appendicitis, not c/w exam - CBC  Preterm labor symptoms and general obstetric precautions including but not limited to vaginal bleeding, contractions, leaking of fluid and fetal movement were reviewed in detail with the patient. Please refer to After Visit Summary for other counseling recommendations.   Return in about 4 weeks (around 11/28/2019) for [facility].  Future Appointments  Date Time Provider Rhame  11/01/2019  9:00 AM [facility] ROOM 8 [facility] None  11/03/2019 10:00 AM [facility] ROOM 1 [facility] None  11/15/2019  9:00 AM [facility] US1 [facility]  12/05/2019 10:10 AM Seabron Spates, CNM [facility] None    Hansel Feinstein, CNM

## 2019-10-31 NOTE — MAU Provider Note (Signed)
Chief Complaint: Emesis During Pregnancy   First Provider Initiated Contact with Patient 09/08/19 2009      SUBJECTIVE HPI: Vicki Harper is a 24 y.o. G1P0 at [redacted]w[redacted]d by LMP who presents to maternity admissions reporting nausea and vomiting multiple times in 24 hours. Zofran was helping but has not helped today.  She is also spitting which is making the nausea worse. There are no other symptoms. She has new OB appt at [facility] on 09/12/19. She denies vaginal bleeding, vaginal itching/burning, urinary symptoms, h/a, dizziness, or fever/chills.     Duration: 4-5 weeks Timing: 5-6 times per day Modifying factors: Zofran/Diclegis Associated signs and symptoms: none  HPI  Past Medical History:  Diagnosis Date  . Asthma   . Back pain   . Headache    Past Surgical History:  Procedure Laterality Date  . NO PAST SURGERIES     Social History   Socioeconomic History  . Marital status: Single    Spouse name: Not on file  . Number of children: Not on file  . Years of education: Not on file  . Highest education level: Not on file  Occupational History  . Not on file  Tobacco Use  . Smoking status: Never Smoker  . Smokeless tobacco: Never Used  Substance and Sexual Activity  . Alcohol use: No    Alcohol/week: 0.0 standard drinks  . Drug use: No  . Sexual activity: Yes    Birth control/protection: None  Other Topics Concern  . Not on file  Social History Narrative  . Not on file   Social Determinants of Health   Financial Resource Strain:   . Difficulty of Paying Living Expenses:   Food Insecurity:   . Worried About Charity fundraiser in the Last Year:   . Arboriculturist in the Last Year:   Transportation Needs:   . Film/video editor (Medical):   Marland Kitchen Lack of Transportation (Non-Medical):   Physical Activity:   . Days of Exercise per Week:   . Minutes of Exercise per Session:   Stress:   . Feeling of Stress :   Social Connections:   . Frequency of Communication with  Friends and Family:   . Frequency of Social Gatherings with Friends and Family:   . Attends Religious Services:   . Active Member of Clubs or Organizations:   . Attends Archivist Meetings:   Marland Kitchen Marital Status:   Intimate Partner Violence:   . Fear of Current or Ex-Partner:   . Emotionally Abused:   Marland Kitchen Physically Abused:   . Sexually Abused:    No current facility-administered medications on file prior to encounter.   Current Outpatient Medications on File Prior to Encounter  Medication Sig Dispense Refill  . albuterol (PROVENTIL HFA;VENTOLIN HFA) 108 (90 BASE) MCG/ACT inhaler Inhale 1-2 puffs into the lungs every 6 (six) hours as needed for wheezing or shortness of breath. (Patient not taking: Reported on 10/10/2019) 1 Inhaler 0  . albuterol (PROVENTIL) (2.5 MG/3ML) 0.083% nebulizer solution Take 2.5 mg by nebulization every 6 (six) hours as needed for wheezing or shortness of breath.     No Known Allergies  ROS:  Review of Systems  Constitutional: Negative for chills and fever.  Respiratory: Negative for cough and shortness of breath.   Cardiovascular: Negative for chest pain.  Gastrointestinal: Positive for nausea and vomiting.  Genitourinary: Negative for dysuria, frequency and urgency.  Musculoskeletal: Negative.   Neurological: Negative for dizziness and headaches.  I have reviewed patient's Past Medical Hx, Surgical Hx, Family Hx, Social Hx, medications and allergies.   Physical Exam  No data found. Constitutional: Well-developed, well-nourished female in no acute distress.  Cardiovascular: normal rate Respiratory: normal effort GI: Abd soft, non-tender. Pos BS x 4 MS: Extremities nontender, no edema, normal ROM Neurologic: Alert and oriented x 4.  GU: Neg CVAT.  PELVIC EXAM: Deferred  FHT  wnl by doppler  LAB RESULTS No results found for this or any previous visit (from the past 24 hour(s)).  Results for orders placed or performed during the  hospital encounter of 10/05/19 (from the past 1344 hour(s))  Urinalysis, Routine w reflex microscopic   Collection Time: 10/05/19  3:44 PM  Result Value Ref Range   Color, Urine YELLOW YELLOW   APPearance CLOUDY (A) CLEAR   Specific Gravity, Urine 1.023 1.005 - 1.030   pH 7.0 5.0 - 8.0   Glucose, UA NEGATIVE NEGATIVE mg/dL   Hgb urine dipstick NEGATIVE NEGATIVE   Bilirubin Urine NEGATIVE NEGATIVE   Ketones, ur NEGATIVE NEGATIVE mg/dL   Protein, ur NEGATIVE NEGATIVE mg/dL   Nitrite NEGATIVE NEGATIVE   Leukocytes,Ua NEGATIVE NEGATIVE  Comprehensive metabolic panel   Collection Time: 10/05/19  3:52 PM  Result Value Ref Range   Sodium 136 135 - 145 mmol/L   Potassium 4.0 3.5 - 5.1 mmol/L   Chloride 106 98 - 111 mmol/L   CO2 23 22 - 32 mmol/L   Glucose, Bld 87 70 - 99 mg/dL   BUN 7 6 - 20 mg/dL   Creatinine, Ser 0.76 0.44 - 1.00 mg/dL   Calcium 8.9 8.9 - 10.3 mg/dL   Total Protein 5.9 (L) 6.5 - 8.1 g/dL   Albumin 3.2 (L) 3.5 - 5.0 g/dL   AST 14 (L) 15 - 41 U/L   ALT 10 0 - 44 U/L   Alkaline Phosphatase 25 (L) 38 - 126 U/L   Total Bilirubin 0.7 0.3 - 1.2 mg/dL   GFR calc non Af Amer >60 >60 mL/min   GFR calc Af Amer >60 >60 mL/min   Anion gap 7 5 - 15  CBC   Collection Time: 10/05/19  3:52 PM  Result Value Ref Range   WBC 8.4 4.0 - 10.5 K/uL   RBC 3.53 (L) 3.87 - 5.11 MIL/uL   Hemoglobin 10.6 (L) 12.0 - 15.0 g/dL   HCT 32.5 (L) 36.0 - 46.0 %   MCV 92.1 80.0 - 100.0 fL   MCH 30.0 26.0 - 34.0 pg   MCHC 32.6 30.0 - 36.0 g/dL   RDW 12.7 11.5 - 15.5 %   Platelets 222 150 - 400 K/uL   nRBC 0.0 0.0 - 0.2 %  Results for orders placed or performed during the hospital encounter of 09/08/19 (from the past 1344 hour(s))  Urinalysis, Routine w reflex microscopic   Collection Time: 09/08/19  7:45 PM  Result Value Ref Range   Color, Urine YELLOW YELLOW   APPearance HAZY (A) CLEAR   Specific Gravity, Urine 1.027 1.005 - 1.030   pH 8.0 5.0 - 8.0   Glucose, UA NEGATIVE NEGATIVE mg/dL    Hgb urine dipstick NEGATIVE NEGATIVE   Bilirubin Urine NEGATIVE NEGATIVE   Ketones, ur 80 (A) NEGATIVE mg/dL   Protein, ur 100 (A) NEGATIVE mg/dL   Nitrite NEGATIVE NEGATIVE   Leukocytes,Ua NEGATIVE NEGATIVE   RBC / HPF 0-5 0 - 5 RBC/hpf   WBC, UA 0-5 0 - 5 WBC/hpf   Bacteria, UA RARE (  A) NONE SEEN   Squamous Epithelial / LPF 0-5 0 - 5   Mucus PRESENT   ]  B/Positive/-- (04/06 1047)  IMAGING No results found.  MAU Management/MDM: Orders Placed This Encounter  Procedures  . Urinalysis, Routine w reflex microscopic  . Discharge patient    Meds ordered this encounter  Medications  . promethazine (PHENERGAN) injection 12.5 mg  . multivitamins adult (INFUVITE ADULT) 10 mL in dextrose 5% lactated ringers 1,000 mL infusion  . famotidine (PEPCID) IVPB 20 mg premix  . glycopyrrolate (ROBINUL) injection 0.1 mg  . lactated ringers bolus 1,000 mL  . ondansetron (ZOFRAN) injection 4 mg  . DISCONTD: scopolamine (TRANSDERM-SCOP) 1 MG/3DAYS 1.5 mg  . DISCONTD: metoCLOPramide (REGLAN) 10 MG tablet    Sig: Take 1 tablet (10 mg total) by mouth 3 (three) times daily with meals as needed for nausea.    Dispense:  30 tablet    Refill:  1    Order Specific Question:   Supervising Provider    Answer:   Elonda Husky, LUTHER H [[id number]]  . DISCONTD: scopolamine (TRANSDERM-SCOP) 1 MG/3DAYS    Sig: Place 1 patch (1.5 mg total) onto the skin every 3 (three) days.    Dispense:  10 patch    Refill:  1    Order Specific Question:   Supervising Provider    Answer:   Elonda Husky, LUTHER H [[id number]]  . DISCONTD: promethazine (PHENERGAN) 12.5 MG tablet    Sig: Take 1-2 tablets (12.5-25 mg total) by mouth at bedtime as needed for nausea or vomiting.    Dispense:  30 tablet    Refill:  0    Order Specific Question:   Supervising Provider    Answer:   Elonda Husky, LUTHER H [[id number]]  . pantoprazole (PROTONIX) 20 MG tablet    Sig: Take 1 tablet (20 mg total) by mouth daily.    Dispense:  30 tablet    Refill:  2    Order Specific  Question:   Supervising Provider    Answer:   Elonda Husky, LUTHER H [[id number]]  . DISCONTD: glycopyrrolate (ROBINUL) 2 MG tablet    Sig: Take 1 tablet (2 mg total) by mouth 3 (three) times daily as needed.    Dispense:  30 tablet    Refill:  3    Order Specific Question:   Supervising Provider    Answer:   Tania Ade H [[id number]]    Pt symptoms improved with IV fluids/antiemetics. Rx for Scopolamine and Robinul given.  Pt to keep new OB visit as scheduled. Return to MAU for emergencies. Pt discharged with strict return precautions.  ASSESSMENT 1. Nausea and vomiting during pregnancy prior to [redacted] weeks gestation   2. Mild dehydration   3. Ptyalism     PLAN Discharge home Allergies as of 09/09/2019   No Known Allergies     Medication List    STOP taking these medications   Doxylamine-Pyridoxine 10-10 MG Tbec Commonly known as: Diclegis   famotidine 20 MG tablet Commonly known as: PEPCID   metoCLOPramide 10 MG tablet Commonly known as: REGLAN   promethazine 25 MG tablet Commonly known as: PHENERGAN   scopolamine 1 MG/3DAYS Commonly known as: TRANSDERM-SCOP     TAKE these medications   albuterol (2.5 MG/3ML) 0.083% nebulizer solution Commonly known as: PROVENTIL Take 2.5 mg by nebulization every 6 (six) hours as needed for wheezing or shortness of breath.   albuterol 108 (90 Base) MCG/ACT inhaler Commonly known as: VENTOLIN HFA Inhale 1-2 puffs  into the lungs every 6 (six) hours as needed for wheezing or shortness of breath.   pantoprazole 20 MG tablet Commonly known as: PROTONIX Take 1 tablet (20 mg total) by mouth daily.        Fatima Blank Certified Nurse-Midwife 10/31/2019  1:01 PM

## 2019-10-31 NOTE — Patient Instructions (Signed)
Alpha-Fetoprotein Test Why am I having this test? The alpha-fetoprotein test is most commonly used in pregnant women to help screen for birth defects in their unborn baby. It can be used to screen for birth defects, such as chromosome (DNA) abnormalities, problems with the brain or spinal cord, or problems with the abdominal wall of the unborn baby (fetus). The alpha-fetoprotein test may also be done for men or non-pregnant women to check for certain cancers. What is being tested? This test measures the amount of alpha-fetoprotein (AFP) in your blood. AFP is a protein that is made by the liver. Levels can be detected in the mother's blood during pregnancy, starting at 10 weeks and peaking at 16-18 weeks of the pregnancy. Abnormal levels can sometimes be a sign of a birth defect in the baby. Certain cancers can cause a high level of AFP in men and non-pregnant women. What kind of sample is taken?  A blood sample is required for this test. It is usually collected by inserting a needle into a blood vessel. How are the results reported? Your test results will be reported as values. Your health care provider will compare your results to normal ranges that were established after testing a large group of people (reference values). Reference values may vary among labs and hospitals. For this test, common reference values are:  Adult: Less than 40 ng/mL or less than 40 mcg/L (SI units).  Child younger than 1 year: Less than 30 ng/mL. If you are pregnant, the values may also vary based on how long you have been pregnant. What do the results mean? Results that are above the reference values in pregnant women may indicate the following for the baby:  Neural tube defects, such as abnormalities of the spinal cord or brain.  Abdominal wall defects.  Multiple pregnancy such as twins.  Fetal distress or fetal death. Results that are above the reference values in men or non-pregnant women may  indicate:  Reproductive cancers, such as ovarian or testicular cancer.  Liver cancer.  Liver cell death.  Other types of cancer. Very low levels of AFP in pregnant women may indicate the following for the baby:  Down syndrome.  Fetal death. Talk with your health care provider about what your results mean. Questions to ask your health care provider Ask your health care provider, or the department that is doing the test:  When will my results be ready?  How will I get my results?  What are my treatment options?  What other tests do I need?  What are my next steps? Summary  The alpha-fetoprotein test is done on pregnant women to help screen for birth defects in their unborn baby.  Certain cancers can cause a high level of AFP in men and non-pregnant women.  For this test, a blood sample is usually collected by inserting a needle into a blood vessel.  Talk with your health care provider about what your results mean. This information is not intended to replace advice given to you by your health care provider. Make sure you discuss any questions you have with your health care provider. Document Revised: 05/07/2017 Document Reviewed: 12/29/2016 Elsevier Patient Education  [place] of Pregnancy The second trimester is from week 14 through week 27 (months 4 through 6). The second trimester is often a time when you feel your best. Your body has adjusted to being pregnant, and you begin to feel better physically. Usually, morning sickness has lessened or quit  completely, you may have more energy, and you may have an increase in appetite. The second trimester is also a time when the fetus is growing rapidly. At the end of the sixth month, the fetus is about 9 inches long and weighs about 1 pounds. You will likely begin to feel the baby move (quickening) between 16 and 20 weeks of pregnancy. Body changes during your second trimester Your body continues to go  through many changes during your second trimester. The changes vary from woman to woman.  Your weight will continue to increase. You will notice your lower abdomen bulging out.  You may begin to get stretch marks on your hips, abdomen, and breasts.  You may develop headaches that can be relieved by medicines. The medicines should be approved by your health care provider.  You may urinate more often because the fetus is pressing on your bladder.  You may develop or continue to have heartburn as a result of your pregnancy.  You may develop constipation because certain hormones are causing the muscles that push waste through your intestines to slow down.  You may develop hemorrhoids or swollen, bulging veins (varicose veins).  You may have back pain. This is caused by: ? Weight gain. ? Pregnancy hormones that are relaxing the joints in your pelvis. ? A shift in weight and the muscles that support your balance.  Your breasts will continue to grow and they will continue to become tender.  Your gums may bleed and may be sensitive to brushing and flossing.  Dark spots or blotches (chloasma, mask of pregnancy) may develop on your face. This will likely fade after the baby is born.  A dark line from your belly button to the pubic area (linea nigra) may appear. This will likely fade after the baby is born.  You may have changes in your hair. These can include thickening of your hair, rapid growth, and changes in texture. Some women also have hair loss during or after pregnancy, or hair that feels dry or thin. Your hair will most likely return to normal after your baby is born. What to expect at prenatal visits During a routine prenatal visit:  You will be weighed to make sure you and the fetus are growing normally.  Your blood pressure will be taken.  Your abdomen will be measured to track your baby's growth.  The fetal heartbeat will be listened to.  Any test results from the previous  visit will be discussed. Your health care provider may ask you:  How you are feeling.  If you are feeling the baby move.  If you have had any abnormal symptoms, such as leaking fluid, bleeding, severe headaches, or abdominal cramping.  If you are using any tobacco products, including cigarettes, chewing tobacco, and electronic cigarettes.  If you have any questions. Other tests that may be performed during your second trimester include:  Blood tests that check for: ? Low iron levels (anemia). ? High blood sugar that affects pregnant women (gestational diabetes) between 46 and 28 weeks. ? Rh antibodies. This is to check for a protein on red blood cells (Rh factor).  Urine tests to check for infections, diabetes, or protein in the urine.  An ultrasound to confirm the proper growth and development of the baby.  An amniocentesis to check for possible genetic problems.  Fetal screens for spina bifida and Down syndrome.  HIV (human immunodeficiency virus) testing. Routine prenatal testing includes screening for HIV, unless you choose not to  have this test. Follow these instructions at home: Medicines  Follow your health care provider's instructions regarding medicine use. Specific medicines may be either safe or unsafe to take during pregnancy.  Take a prenatal vitamin that contains at least 600 micrograms (mcg) of folic acid.  If you develop constipation, try taking a stool softener if your health care provider approves. Eating and drinking   Eat a balanced diet that includes fresh fruits and vegetables, whole grains, good sources of protein such as meat, eggs, or tofu, and low-fat dairy. Your health care provider will help you determine the amount of weight gain that is right for you.  Avoid raw meat and uncooked cheese. These carry germs that can cause birth defects in the baby.  If you have low calcium intake from food, talk to your health care provider about whether you  should take a daily calcium supplement.  Limit foods that are high in fat and processed sugars, such as fried and sweet foods.  To prevent constipation: ? Drink enough fluid to keep your urine clear or pale yellow. ? Eat foods that are high in fiber, such as fresh fruits and vegetables, whole grains, and beans. Activity  Exercise only as directed by your health care provider. Most women can continue their usual exercise routine during pregnancy. Try to exercise for 30 minutes at least 5 days a week. Stop exercising if you experience uterine contractions.  Avoid heavy lifting, wear low heel shoes, and practice good posture.  A sexual relationship may be continued unless your health care provider directs you otherwise. Relieving pain and discomfort  Wear a good support bra to prevent discomfort from breast tenderness.  Take warm sitz baths to soothe any pain or discomfort caused by hemorrhoids. Use hemorrhoid cream if your health care provider approves.  Rest with your legs elevated if you have leg cramps or low back pain.  If you develop varicose veins, wear support hose. Elevate your feet for 15 minutes, 3-4 times a day. Limit salt in your diet. Prenatal Care  Write down your questions. Take them to your prenatal visits.  Keep all your prenatal visits as told by your health care provider. This is important. Safety  Wear your seat belt at all times when driving.  Make a list of emergency phone numbers, including numbers for family, friends, the hospital, and police and fire departments. General instructions  Ask your health care provider for a referral to a local prenatal education class. Begin classes no later than the beginning of month 6 of your pregnancy.  Ask for help if you have counseling or nutritional needs during pregnancy. Your health care provider can offer advice or refer you to specialists for help with various needs.  Do not use hot tubs, steam rooms, or  saunas.  Do not douche or use tampons or scented sanitary pads.  Do not cross your legs for long periods of time.  Avoid cat litter boxes and soil used by cats. These carry germs that can cause birth defects in the baby and possibly loss of the fetus by miscarriage or stillbirth.  Avoid all smoking, herbs, alcohol, and unprescribed drugs. Chemicals in these products can affect the formation and growth of the baby.  Do not use any products that contain nicotine or tobacco, such as cigarettes and e-cigarettes. If you need help quitting, ask your health care provider.  Visit your dentist if you have not gone yet during your pregnancy. Use a soft toothbrush to brush  your teeth and be gentle when you floss. Contact a health care provider if:  You have dizziness.  You have mild pelvic cramps, pelvic pressure, or nagging pain in the abdominal area.  You have persistent nausea, vomiting, or diarrhea.  You have a bad smelling vaginal discharge.  You have pain when you urinate. Get help right away if:  You have a fever.  You are leaking fluid from your vagina.  You have spotting or bleeding from your vagina.  You have severe abdominal cramping or pain.  You have rapid weight gain or weight loss.  You have shortness of breath with chest pain.  You notice sudden or extreme swelling of your face, hands, ankles, feet, or legs.  You have not felt your baby move in over an hour.  You have severe headaches that do not go away when you take medicine.  You have vision changes. Summary  The second trimester is from week 14 through week 27 (months 4 through 6). It is also a time when the fetus is growing rapidly.  Your body goes through many changes during pregnancy. The changes vary from woman to woman.  Avoid all smoking, herbs, alcohol, and unprescribed drugs. These chemicals affect the formation and growth your baby.  Do not use any tobacco products, such as cigarettes, chewing  tobacco, and e-cigarettes. If you need help quitting, ask your health care provider.  Contact your health care provider if you have any questions. Keep all prenatal visits as told by your health care provider. This is important. This information is not intended to replace advice given to you by your health care provider. Make sure you discuss any questions you have with your health care provider. Document Revised: 09/16/2018 Document Reviewed: 06/30/2016 Elsevier Patient Education  [place].

## 2019-11-01 ENCOUNTER — Encounter (HOSPITAL_COMMUNITY)
Admission: RE | Admit: 2019-11-01 | Discharge: 2019-11-01 | Disposition: A | Discharge status: Home / Self Care | Payer: Medicaid Other | Source: Ambulatory Visit | Attending: Family Medicine | Admitting: Family Medicine

## 2019-11-01 DIAGNOSIS — Z3400 Encounter for supervision of normal first pregnancy, unspecified trimester: Secondary | ICD-10-CM

## 2019-11-01 DIAGNOSIS — O210 Mild hyperemesis gravidarum: Secondary | ICD-10-CM

## 2019-11-01 MED ORDER — PROMETHAZINE HCL 25 MG/ML IJ SOLN
25.0000 mg | INTRAVENOUS | Status: DC
  Administered 2019-11-01: 25 mg via INTRAVENOUS
  Filled 2019-11-01: qty 1

## 2019-11-02 LAB — CBC
Hematocrit: 32 % — ABNORMAL LOW (ref 34.0–46.6)
Hemoglobin: 10.8 g/dL — ABNORMAL LOW (ref 11.1–15.9)
MCH: 30.3 pg (ref 26.6–33.0)
MCHC: 33.8 g/dL (ref 31.5–35.7)
MCV: 90 fL (ref 79–97)
Platelets: 249 10*3/uL (ref 150–450)
RBC: 3.56 x10E6/uL — ABNORMAL LOW (ref 3.77–5.28)
RDW: 12.4 % (ref 11.7–15.4)
WBC: 7.5 10*3/uL (ref 3.4–10.8)

## 2019-11-02 LAB — AFP, SERUM, OPEN SPINA BIFIDA
AFP MoM: 1.24
AFP Value: 49.6 ng/mL
Gest. Age on Collection Date: 16.6 weeks
Maternal Age At EDD: 25 yr
OSBR Risk 1 IN: 10000
Test Results:: NEGATIVE
Weight: 155 [lb_av]

## 2019-11-03 ENCOUNTER — Ambulatory Visit (HOSPITAL_COMMUNITY)
Admission: RE | Admit: 2019-11-03 | Discharge: 2019-11-03 | Disposition: A | Discharge status: Home / Self Care | Payer: Medicaid Other | Source: Ambulatory Visit | Attending: Family Medicine | Admitting: Family Medicine

## 2019-11-03 ENCOUNTER — Other Ambulatory Visit: Payer: Self-pay

## 2019-11-03 DIAGNOSIS — Z3400 Encounter for supervision of normal first pregnancy, unspecified trimester: Secondary | ICD-10-CM | POA: Diagnosis present

## 2019-11-03 DIAGNOSIS — O210 Mild hyperemesis gravidarum: Secondary | ICD-10-CM

## 2019-11-03 MED ORDER — PROMETHAZINE HCL 25 MG/ML IJ SOLN
25.0000 mg | INTRAVENOUS | Status: DC
  Administered 2019-11-03: 25 mg via INTRAVENOUS
  Filled 2019-11-03: qty 1

## 2019-11-14 ENCOUNTER — Other Ambulatory Visit: Payer: Self-pay

## 2019-11-14 DIAGNOSIS — F419 Anxiety disorder, unspecified: Secondary | ICD-10-CM

## 2019-11-14 DIAGNOSIS — F32A Depression, unspecified: Secondary | ICD-10-CM

## 2019-11-14 NOTE — Progress Notes (Signed)
Pt sent a MyChart message requesting an appointment for depression and anxiety. A referral for Behavior health was ordered. Vicki Harper l Vicki Harper, CMA

## 2019-11-15 ENCOUNTER — Other Ambulatory Visit: Payer: Self-pay

## 2019-11-15 ENCOUNTER — Other Ambulatory Visit (HOSPITAL_COMMUNITY)
Admission: RE | Admit: 2019-11-15 | Discharge: 2019-11-15 | Disposition: A | Discharge status: Home / Self Care | Payer: Medicaid Other | Source: Ambulatory Visit | Attending: Obstetrics & Gynecology | Admitting: Obstetrics & Gynecology

## 2019-11-15 ENCOUNTER — Ambulatory Visit (HOSPITAL_COMMUNITY): Payer: Medicaid Other | Attending: Obstetrics and Gynecology

## 2019-11-15 ENCOUNTER — Ambulatory Visit (INDEPENDENT_AMBULATORY_CARE_PROVIDER_SITE_OTHER): Payer: Medicaid Other

## 2019-11-15 VITALS — BP 103/58 | HR 71 | Wt 148.0 lb

## 2019-11-15 DIAGNOSIS — Z3400 Encounter for supervision of normal first pregnancy, unspecified trimester: Secondary | ICD-10-CM | POA: Insufficient documentation

## 2019-11-15 DIAGNOSIS — Z363 Encounter for antenatal screening for malformations: Secondary | ICD-10-CM

## 2019-11-15 DIAGNOSIS — Z113 Encounter for screening for infections with a predominantly sexual mode of transmission: Secondary | ICD-10-CM

## 2019-11-15 NOTE — Progress Notes (Addendum)
Pt presents to the office for STI screening. Self swab was sent to the lab.  Vicki Harper l Krista Godsil, CMA   Attestation of Attending Supervision of CMA: Evaluation and management procedures were performed by the nurse under my supervision and collaboration.  I have reviewed the nursing note and chart, and I agree with the management and plan.  Carolyn L. Harraway-Smith, M.D., Cherlynn June

## 2019-11-16 LAB — CERVICOVAGINAL ANCILLARY ONLY
Chlamydia: NEGATIVE
Comment: NEGATIVE
Comment: NEGATIVE
Comment: NORMAL
Neisseria Gonorrhea: NEGATIVE
Trichomonas: NEGATIVE

## 2019-11-18 ENCOUNTER — Inpatient Hospital Stay (HOSPITAL_COMMUNITY)
Admission: AD | Admit: 2019-11-18 | Discharge: 2019-11-19 | Disposition: A | Discharge status: Home / Self Care | Payer: Medicaid Other | Attending: Obstetrics & Gynecology | Admitting: Obstetrics & Gynecology

## 2019-11-18 ENCOUNTER — Other Ambulatory Visit: Payer: Self-pay

## 2019-11-18 ENCOUNTER — Encounter (HOSPITAL_COMMUNITY): Payer: Self-pay | Admitting: Obstetrics and Gynecology

## 2019-11-18 DIAGNOSIS — R102 Pelvic and perineal pain: Secondary | ICD-10-CM | POA: Insufficient documentation

## 2019-11-18 DIAGNOSIS — J45909 Unspecified asthma, uncomplicated: Secondary | ICD-10-CM | POA: Insufficient documentation

## 2019-11-18 DIAGNOSIS — Z79899 Other long term (current) drug therapy: Secondary | ICD-10-CM | POA: Diagnosis not present

## 2019-11-18 DIAGNOSIS — O26892 Other specified pregnancy related conditions, second trimester: Secondary | ICD-10-CM | POA: Diagnosis not present

## 2019-11-18 DIAGNOSIS — Z3A19 19 weeks gestation of pregnancy: Secondary | ICD-10-CM | POA: Insufficient documentation

## 2019-11-18 DIAGNOSIS — M549 Dorsalgia, unspecified: Secondary | ICD-10-CM | POA: Insufficient documentation

## 2019-11-18 DIAGNOSIS — O99512 Diseases of the respiratory system complicating pregnancy, second trimester: Secondary | ICD-10-CM | POA: Diagnosis not present

## 2019-11-18 NOTE — MAU Note (Signed)
Pt reports to MAU c/o pelvic pressure that has been ongoing x1 week. Pt states the pain is sharp and it hurts to walk and sit. Pt states it is in the bottom of her stomach to the top of her pelvis. Pt reports a clear clumpy/stringy discharge. Pt denies vaginal bleeding. Pt denies recent sexual intercourse.

## 2019-11-18 NOTE — MAU Provider Note (Signed)
History     CSN: [id number]  Arrival date and time: 11/18/19 2302   First Provider Initiated Contact with Patient 11/18/19 2347      Chief Complaint  Patient presents with  . Pelvic Pain  . Vaginal Discharge   Vicki Harper is a 24 y.o. G1P0 at [redacted]w[redacted]d who receives care at [facility].  She presents today for Pelvic Pain and Vaginal Discharge. Patient states she has been experiencing constant pelvic pain for the "past couple days."  Patient describes the pain as a "sharp cramp" that she rates a 6/10.  She states the pain causes issues with ambulation. She states she has not taken tylenol with minimal relief of symptoms.  She states the pain is not felt during sleep, but is worsened with going from sitting to standing.  Patient endorses fetal flutters and denies vaginal bleeding or leaking.  Patient reports some discharge that is "stringy, clear, and without odor."  She states this has been ongoing throughout the pregnancy.       OB History    Gravida  1   Para      Term      Preterm      AB      Living        SAB      TAB      Ectopic      Multiple      Live Births              Past Medical History:  Diagnosis Date  . Asthma   . Back pain   . Headache     Past Surgical History:  Procedure Laterality Date  . NO PAST SURGERIES      Family History  Problem Relation Age of Onset  . Migraines Mother     Social History   Tobacco Use  . Smoking status: Never Smoker  . Smokeless tobacco: Never Used  Vaping Use  . Vaping Use: Never used  Substance Use Topics  . Alcohol use: No    Alcohol/week: 0.0 standard drinks  . Drug use: No    Allergies: No Known Allergies  Medications Prior to Admission  Medication Sig Dispense Refill Last Dose  . albuterol (PROVENTIL HFA;VENTOLIN HFA) 108 (90 BASE) MCG/ACT inhaler Inhale 1-2 puffs into the lungs every 6 (six) hours as needed for wheezing or shortness of breath. 1 Inhaler 0 Past Month at Unknown time  .  ondansetron (ZOFRAN-ODT) 4 MG disintegrating tablet Take 2 tablets (8 mg total) by mouth every 8 (eight) hours as needed. 30 tablet 2 11/18/2019 at Unknown time  . albuterol (PROVENTIL) (2.5 MG/3ML) 0.083% nebulizer solution Take 2.5 mg by nebulization every 6 (six) hours as needed for wheezing or shortness of breath.     Marland Kitchen albuterol (VENTOLIN) (5 MG/ML) 0.5% NEBU Inhale into the lungs.     . Doxylamine-Pyridoxine (DICLEGIS) 10-10 MG TBEC Take 2 tablets by mouth at bedtime. 60 tablet 0   . glycopyrrolate (ROBINUL) 1 MG tablet Take 1 tablet (1 mg total) by mouth 3 (three) times daily. (Patient not taking: Reported on 10/10/2019) 90 tablet 0   . pantoprazole (PROTONIX) 20 MG tablet Take 1 tablet (20 mg total) by mouth daily. (Patient not taking: Reported on 10/10/2019) 30 tablet 2   . scopolamine (TRANSDERM-SCOP) 1 MG/3DAYS Place 1 patch (1.5 mg total) onto the skin every 3 (three) days. 10 patch 1     Review of Systems  Gastrointestinal: Negative for abdominal pain, constipation, diarrhea,  nausea and vomiting.  Genitourinary: Positive for pelvic pain. Negative for difficulty urinating, dysuria, vaginal bleeding and vaginal discharge.  Musculoskeletal: Positive for back pain.  Neurological: Negative for dizziness, light-headedness and headaches.   Physical Exam   Blood pressure 115/71, pulse 86, temperature 98.2 F (36.8 C), temperature source Oral, resp. rate 17, last menstrual period 07/05/2019.  Physical Exam  Constitutional: She is oriented to person, place, and time.  HENT:  Head: Normocephalic and atraumatic.  Eyes: Conjunctivae are normal.  Cardiovascular: Regular rhythm and normal heart sounds.  Respiratory: Effort normal and breath sounds normal.  GI: Soft. Normal appearance and bowel sounds are normal. There is abdominal tenderness in the right lower quadrant and suprapubic area.  Tenderness reported with deep palpation.   Genitourinary:    Genitourinary Comments: NEFG Wet prep  collected by blind swab Cervix closed. No tenderness.    Musculoskeletal:     Cervical back: Normal range of motion.  Neurological: She is alert and oriented to person, place, and time.  Skin: Skin is warm and dry.  Psychiatric: Mood and thought content normal.    MAU Course  Procedures Results for orders placed or performed during the hospital encounter of 11/18/19 (from the past 24 hour(s))  Wet prep, genital     Status: Abnormal   Collection Time: 11/18/19 11:59 PM   Specimen: Vaginal  Result Value Ref Range   Yeast Wet Prep HPF POC NONE SEEN NONE SEEN   Trich, Wet Prep NONE SEEN NONE SEEN   Clue Cells Wet Prep HPF POC NONE SEEN NONE SEEN   WBC, Wet Prep HPF POC MODERATE (A) NONE SEEN   Sperm NONE SEEN   Urinalysis, Routine w reflex microscopic     Status: Abnormal   Collection Time: 11/19/19 12:55 AM  Result Value Ref Range   Color, Urine YELLOW YELLOW   APPearance CLEAR CLEAR   Specific Gravity, Urine 1.031 (H) 1.005 - 1.030   pH 5.0 5.0 - 8.0   Glucose, UA NEGATIVE NEGATIVE mg/dL   Hgb urine dipstick NEGATIVE NEGATIVE   Bilirubin Urine NEGATIVE NEGATIVE   Ketones, ur NEGATIVE NEGATIVE mg/dL   Protein, ur 30 (A) NEGATIVE mg/dL   Nitrite NEGATIVE NEGATIVE   Leukocytes,Ua TRACE (A) NEGATIVE   RBC / HPF 0-5 0 - 5 RBC/hpf   WBC, UA 0-5 0 - 5 WBC/hpf   Bacteria, UA RARE (A) NONE SEEN   Squamous Epithelial / LPF 0-5 0 - 5   Mucus PRESENT     MDM Physical Exam Pain Medication Labs:UA, Wet Prep Muscle Relaxant Assessment and Plan  24 year old G1P0 SIUP at 19.4 weeks Pelvic Pain Back Pain  -POC Reviewed. -Exam performed and findings discussed. -Patient informed that findings suggestive of some round ligament pain as well as pelvic pain s/t uterine growth. -Patient offered Flexeril and heating pad and agreeable. -Instructed to place heating pad on back. -Wet prep collected and sent -Will await results.   [name] 11/18/2019, 11:47 PM   Reassessment  (12:58 AM)  -Patient reports pelvic pain now 3/10. -Will send script to pharmacy for flexeril 10mg  prn usage. -Informed that script will be given for maternity belt.  Encouraged to wear. -UA pending and patient informed that provider will contact, via mychart, with any abnormal results.  -Encouraged to call or return to MAU if symptoms worsen or with the onset of new symptoms. -Discharged to home in improved condition.  [name] MSN, CNM Advanced Practice Provider, [facility]   Addendum 2:49 AM Urine sent for culture

## 2019-11-19 LAB — URINALYSIS, ROUTINE W REFLEX MICROSCOPIC
Bilirubin Urine: NEGATIVE
Glucose, UA: NEGATIVE mg/dL
Hgb urine dipstick: NEGATIVE
Ketones, ur: NEGATIVE mg/dL
Nitrite: NEGATIVE
Protein, ur: 30 mg/dL — AB
Specific Gravity, Urine: 1.031 — ABNORMAL HIGH (ref 1.005–1.030)
pH: 5 (ref 5.0–8.0)

## 2019-11-19 LAB — WET PREP, GENITAL
Clue Cells Wet Prep HPF POC: NONE SEEN
Sperm: NONE SEEN
Trich, Wet Prep: NONE SEEN
Yeast Wet Prep HPF POC: NONE SEEN

## 2019-11-19 MED ORDER — COMFORT FIT MATERNITY SUPP MED MISC
0 refills | Status: DC
Start: 2019-11-19 — End: 2020-04-15

## 2019-11-19 MED ORDER — CYCLOBENZAPRINE HCL 10 MG PO TABS
10.0000 mg | ORAL_TABLET | Freq: Two times a day (BID) | ORAL | 0 refills | Status: DC | PRN
Start: 2019-11-19 — End: 2019-11-30

## 2019-11-19 MED ORDER — CYCLOBENZAPRINE HCL 5 MG PO TABS
10.0000 mg | ORAL_TABLET | Freq: Once | ORAL | Status: AC
  Administered 2019-11-19: 10 mg via ORAL
  Filled 2019-11-19: qty 2

## 2019-11-19 NOTE — Discharge Instructions (Signed)
   PREGNANCY SUPPORT BELT: °You are not alone, Seventy-five percent of women have some sort of abdominal or back pain at some point in their pregnancy. Your baby is growing at a fast pace, which means that your whole body is rapidly trying to adjust to the changes. As your uterus grows, your back may start feeling a bit under stress and this can result in back or abdominal pain that can go from mild, and therefore bearable, to severe pains that will not allow you to sit or lay down comfortably, When it comes to dealing with pregnancy-related pains and cramps, some pregnant women usually prefer natural remedies, which the market is filled with nowadays. For example, wearing a pregnancy support belt can help ease and lessen your discomfort and pain. °WHAT ARE THE BENEFITS OF WEARING A PREGNANCY SUPPORT BELT? A pregnancy support belt provides support to the lower portion of the belly taking some of the weight of the growing uterus and distributing to the other parts of your body. It is designed make you comfortable and gives you extra support. Over the years, the pregnancy apparel market has been studying the needs and wants of pregnant women and they have come up with the most comfortable pregnancy support belts that woman could ever ask for. In fact, you will no longer have to wear a stretched-out or bulky pregnancy belt that is visible underneath your clothes and makes you feel even more uncomfortable. Nowadays, a pregnancy support belt is made of comfortable and stretchy materials that will not irritate your skin but will actually make you feel at ease and you will not even notice you are wearing it. They are easy to put on and adjust during the day and can be worn at night for additional support.  °BENEFITS: °• Relives Back pain °• Relieves Abdominal Muscle and Leg Pain °• Stabilizes the Pelvic Ring °• Offers a Cushioned Abdominal Lift Pad °• Relieves pressure on the Sciatic Nerve Within Minutes °WHERE TO GET  YOUR PREGNANCY BELT: [employer] [phone] @[address] [place], [place] [address] ° ° °

## 2019-11-20 LAB — CULTURE, OB URINE: Culture: <10000 — AB

## 2019-11-30 ENCOUNTER — Other Ambulatory Visit: Payer: Self-pay

## 2019-11-30 ENCOUNTER — Inpatient Hospital Stay (HOSPITAL_COMMUNITY)
Admission: AD | Admit: 2019-11-30 | Discharge: 2019-11-30 | Disposition: A | Discharge status: Home / Self Care | Payer: Medicaid Other | Attending: Obstetrics and Gynecology | Admitting: Obstetrics and Gynecology

## 2019-11-30 ENCOUNTER — Encounter (HOSPITAL_COMMUNITY): Payer: Self-pay | Admitting: Obstetrics and Gynecology

## 2019-11-30 DIAGNOSIS — O99891 Other specified diseases and conditions complicating pregnancy: Secondary | ICD-10-CM

## 2019-11-30 DIAGNOSIS — R102 Pelvic and perineal pain: Secondary | ICD-10-CM | POA: Insufficient documentation

## 2019-11-30 DIAGNOSIS — O212 Late vomiting of pregnancy: Secondary | ICD-10-CM | POA: Insufficient documentation

## 2019-11-30 DIAGNOSIS — O210 Mild hyperemesis gravidarum: Secondary | ICD-10-CM | POA: Diagnosis not present

## 2019-11-30 DIAGNOSIS — O26852 Spotting complicating pregnancy, second trimester: Secondary | ICD-10-CM | POA: Insufficient documentation

## 2019-11-30 DIAGNOSIS — N898 Other specified noninflammatory disorders of vagina: Secondary | ICD-10-CM | POA: Insufficient documentation

## 2019-11-30 DIAGNOSIS — N852 Hypertrophy of uterus: Secondary | ICD-10-CM | POA: Diagnosis not present

## 2019-11-30 DIAGNOSIS — O26892 Other specified pregnancy related conditions, second trimester: Secondary | ICD-10-CM | POA: Diagnosis not present

## 2019-11-30 DIAGNOSIS — Z79899 Other long term (current) drug therapy: Secondary | ICD-10-CM | POA: Diagnosis not present

## 2019-11-30 DIAGNOSIS — Z3A21 21 weeks gestation of pregnancy: Secondary | ICD-10-CM | POA: Insufficient documentation

## 2019-11-30 LAB — URINALYSIS, ROUTINE W REFLEX MICROSCOPIC
Bilirubin Urine: NEGATIVE
Glucose, UA: NEGATIVE mg/dL
Hgb urine dipstick: NEGATIVE
Ketones, ur: NEGATIVE mg/dL
Nitrite: NEGATIVE
Protein, ur: 30 mg/dL — AB
Specific Gravity, Urine: 1.028 (ref 1.005–1.030)
pH: 6 (ref 5.0–8.0)

## 2019-11-30 LAB — WET PREP, GENITAL
Sperm: NONE SEEN
Trich, Wet Prep: NONE SEEN
Yeast Wet Prep HPF POC: NONE SEEN

## 2019-11-30 MED ORDER — LACTATED RINGERS IV BOLUS
1000.0000 mL | Freq: Once | INTRAVENOUS | Status: AC
  Administered 2019-11-30: 1000 mL via INTRAVENOUS

## 2019-11-30 MED ORDER — ONDANSETRON HCL 4 MG/2ML IJ SOLN
4.0000 mg | Freq: Once | INTRAMUSCULAR | Status: AC
  Administered 2019-11-30: 4 mg via INTRAVENOUS
  Filled 2019-11-30 (×2): qty 2

## 2019-11-30 MED ORDER — VALACYCLOVIR HCL 1 G PO TABS
1000.0000 mg | ORAL_TABLET | Freq: Two times a day (BID) | ORAL | 0 refills | Status: AC
Start: 2019-11-30 — End: 2019-12-07

## 2019-11-30 MED ORDER — FAMOTIDINE IN NACL 20-0.9 MG/50ML-% IV SOLN
20.0000 mg | Freq: Once | INTRAVENOUS | Status: AC
  Administered 2019-11-30: 20 mg via INTRAVENOUS
  Filled 2019-11-30: qty 50

## 2019-11-30 MED ORDER — PROMETHAZINE HCL 25 MG/ML IJ SOLN: 25.0000 mg | Freq: Once | INTRAVENOUS | Status: DC

## 2019-11-30 NOTE — MAU Note (Addendum)
Pt states that a couple of months ago she was always being seen here for hyperemesis.   Pt states that her baby was use to getting  to getting the IV fluid 3 times a week. Pt states that she does not know if she is dehydrated or not, but wanted to come in since it has been a long time since she had IV therapy.   Pt reports heartburn that has been going on for a month. Pt reports it is worse at night. Pt states she is just having a little heartburn now.   Pt reports breaking out from taking phenergan vaginally.   Reports spotting, but unsure of where blood is coming from

## 2019-11-30 NOTE — Discharge Instructions (Signed)
Morning Sickness  Morning sickness is when you feel sick to your stomach (nauseous) during pregnancy. You may feel sick to your stomach and throw up (vomit). You may feel sick in the morning, but you can feel this way at any time of day. Some women feel very sick to their stomach and cannot stop throwing up (hyperemesis gravidarum). Follow these instructions at home: Medicines  Take over-the-counter and prescription medicines only as told by your doctor. Do not take any medicines until you talk with your doctor about them first.  Taking multivitamins before getting pregnant can stop or lessen the harshness of morning sickness. Eating and drinking  Eat dry toast or crackers before getting out of bed.  Eat 5 or 6 small meals a day.  Eat dry and bland foods like rice and baked potatoes.  Do not eat greasy, fatty, or spicy foods.  Have someone cook for you if the smell of food causes you to feel sick or throw up.  If you feel sick to your stomach after taking prenatal vitamins, take them at night or with a snack.  Eat protein when you need a snack. Nuts, yogurt, and cheese are good choices.  Drink fluids throughout the day.  Try ginger ale made with real ginger, ginger tea made from fresh grated ginger, or ginger candies. General instructions  Do not use any products that have nicotine or tobacco in them, such as cigarettes and e-cigarettes. If you need help quitting, ask your doctor.  Use an air purifier to keep the air in your house free of smells.  Get lots of fresh air.  Try to avoid smells that make you feel sick.  Try: ? Wearing a bracelet that is used for seasickness (acupressure wristband). ? Going to a doctor who puts thin needles into certain body points (acupuncture) to improve how you feel. Contact a doctor if:  You need medicine to feel better.  You feel dizzy or light-headed.  You are losing weight. Get help right away if:  You feel very sick to your  stomach and cannot stop throwing up.  You pass out (faint).  You have very bad pain in your belly. Summary  Morning sickness is when you feel sick to your stomach (nauseous) during pregnancy.  You may feel sick in the morning, but you can feel this way at any time of day.  Making some changes to what you eat may help your symptoms go away. This information is not intended to replace advice given to you by your health care provider. Make sure you discuss any questions you have with your health care provider. Document Revised: 05/07/2017 Document Reviewed: 06/25/2016 Elsevier Patient Education  2020 [place].   Hyperemesis Gravidarum Hyperemesis gravidarum is a severe form of nausea and vomiting that happens during pregnancy. Hyperemesis is worse than morning sickness. It may cause you to have nausea or vomiting all day for many days. It may keep you from eating and drinking enough food and liquids, which can lead to dehydration, malnutrition, and weight loss. Hyperemesis usually occurs during the first half (the first 20 weeks) of pregnancy. It often goes away once a woman is in her second half of pregnancy. However, sometimes hyperemesis continues through an entire pregnancy. What are the causes? The cause of this condition is not known. It may be related to changes in chemicals (hormones) in the body during pregnancy, such as the high level of pregnancy hormone (human chorionic gonadotropin) or the increase in the  female sex hormone (estrogen). What are the signs or symptoms? Symptoms of this condition include:  Nausea that does not go away.  Vomiting that does not allow you to keep any food down.  Weight loss.  Body fluid loss (dehydration).  Having no desire to eat, or not liking food that you have previously enjoyed. How is this diagnosed? This condition may be diagnosed based on:  A physical exam.  Your medical history.  Your symptoms.  Blood tests.  Urine  tests. How is this treated? This condition is managed by controlling symptoms. This may include:  Following an eating plan. This can help lessen nausea and vomiting.  Taking prescription medicines. An eating plan and medicines are often used together to help control symptoms. If medicines do not help relieve nausea and vomiting, you may need to receive fluids through an IV at the hospital. Follow these instructions at home: Eating and drinking   Avoid the following: ? Drinking fluids with meals. Try not to drink anything during the 30 minutes before and after your meals. ? Drinking more than 1 cup of fluid at a time. ? Eating foods that trigger your symptoms. These may include spicy foods, coffee, high-fat foods, very sweet foods, and acidic foods. ? Skipping meals. Nausea can be more intense on an empty stomach. If you cannot tolerate food, do not force it. Try sucking on ice chips or other frozen items and make up for missed calories later. ? Lying down within 2 hours after eating. ? Being exposed to environmental triggers. These may include food smells, smoky rooms, closed spaces, rooms with strong smells, warm or humid places, overly loud and noisy rooms, and rooms with motion or flickering lights. Try eating meals in a well-ventilated area that is free of strong smells. ? Quick and sudden changes in your movement. ? Taking iron pills and multivitamins that contain iron. If you take prescription iron pills, do not stop taking them unless your health care provider approves. ? Preparing food. The smell of food can spoil your appetite or trigger nausea.  To help relieve your symptoms: ? Listen to your body. Everyone is different and has different preferences. Find what works best for you. ? Eat and drink slowly. ? Eat 5-6 small meals daily instead of 3 large meals. Eating small meals and snacks can help you avoid an empty stomach. ? In the morning, before getting out of bed, eat a couple  of crackers to avoid moving around on an empty stomach. ? Try eating starchy foods as these are usually tolerated well. Examples include cereal, toast, bread, potatoes, pasta, rice, and pretzels. ? Include at least 1 serving of protein with your meals and snacks. Protein options include lean meats, poultry, seafood, beans, nuts, nut butters, eggs, cheese, and yogurt. ? Try eating a protein-rich snack before bed. Examples of a protein-rick snack include cheese and crackers or a peanut butter sandwich made with 1 slice of whole-wheat bread and 1 tsp (5 g) of peanut butter. ? Eat or suck on things that have ginger in them. It may help relieve nausea. Add  tsp ground ginger to hot tea or choose ginger tea. ? Try drinking 100% fruit juice or an electrolyte drink. An electrolyte drink contains sodium, potassium, and chloride. ? Drink fluids that are cold, clear, and carbonated or sour. Examples include lemonade, ginger ale, lemon-lime soda, ice water, and sparkling water. ? Brush your teeth or use a mouth rinse after meals. ? Talk with your  health care provider about starting a supplement of vitamin B6. General instructions  Take over-the-counter and prescription medicines only as told by your health care provider.  Follow instructions from your health care provider about eating or drinking restrictions.  Continue to take your prenatal vitamins as told by your health care provider. If you are having trouble taking your prenatal vitamins, talk with your health care provider about different options.  Keep all follow-up and pre-birth (prenatal) visits as told by your health care provider. This is important. Contact a health care provider if:  You have pain in your abdomen.  You have a severe headache.  You have vision problems.  You are losing weight.  You feel weak or dizzy. Get help right away if:  You cannot drink fluids without vomiting.  You vomit blood.  You have constant nausea and  vomiting.  You are very weak.  You faint.  You have a fever and your symptoms suddenly get worse. Summary  Hyperemesis gravidarum is a severe form of nausea and vomiting that happens during pregnancy.  Making some changes to your eating habits may help relieve nausea and vomiting.  This condition may be managed with medicine.  If medicines do not help relieve nausea and vomiting, you may need to receive fluids through an IV at the hospital. This information is not intended to replace advice given to you by your health care provider. Make sure you discuss any questions you have with your health care provider. Document Revised: 06/14/2017 Document Reviewed: 01/22/2016 Elsevier Patient Education  [place].  Genital Herpes Genital herpes is a common sexually transmitted infection (STI) that is caused by a virus. The virus spreads from person to person through sexual contact. Infection can cause itching, blisters, and sores around the genitals or rectum. Symptoms may last several days and then go away This is called an outbreak. However, the virus remains in your body, so you may have more outbreaks in the future. The time between outbreaks varies and can be months or years. Genital herpes affects men and women. It is particularly concerning for pregnant women because the virus can be passed to the baby during delivery and can cause serious problems. Genital herpes is also a concern for people who have a weak disease-fighting (immune) system. What are the causes? This condition is caused by the herpes simplex virus (HSV) type 1 or type 2. The virus may spread through:  Sexual contact with an infected person, including vaginal, anal, and oral sex.  Contact with fluid from a herpes sore.  The skin. This means that you can get herpes from an infected partner even if he or she does not have a visible sore or does not know that he or she is infected. What increases the risk? You are  more likely to develop this condition if:  You have sex with many partners.  You do not use latex condoms during sex. What are the signs or symptoms? Most people do not have symptoms (asymptomatic) or have mild symptoms that may be mistaken for other skin problems. Symptoms may include:  Small red bumps near the genitals, rectum, or mouth. These bumps turn into blisters and then turn into sores.  Flu-like symptoms, including: ? Fever. ? Body aches. ? Swollen lymph nodes. ? Headache.  Painful urination.  Pain and itching in the genital area or rectal area.  Vaginal discharge.  Tingling or shooting pain in the legs and buttocks. Generally, symptoms are more severe and  last longer during the first (primary) outbreak. Flu-like symptoms are also more common during the primary outbreak. How is this diagnosed? Genital herpes may be diagnosed based on:  A physical exam.  Your medical history.  Blood tests.  A test of a fluid sample (culture) from an open sore. How is this treated? There is no cure for this condition, but treatment with antiviral medicines that are taken by mouth (orally) can do the following:  Speed up healing and relieve symptoms.  Help to reduce the spread of the virus to sexual partners.  Limit the chance of future outbreaks, or make future outbreaks shorter.  Lessen symptoms of future outbreaks. Your health care provider may also recommend pain relief medicines, such as aspirin or ibuprofen. Follow these instructions at home: Sexual activity  Do not have sexual contact during active outbreaks.  Practice safe sex. Latex condoms and female condoms may help prevent the spread of the herpes virus. General instructions  Keep the affected areas dry and clean.  Take over-the-counter and prescription medicines only as told by your health care provider.  Avoid rubbing or touching blisters and sores. If you do touch blisters or sores: ? Wash your hands  thoroughly with soap and water. ? Do not touch your eyes afterward.  To help relieve pain or itching, you may take the following actions as directed by your health care provider: ? Apply a cold, wet cloth (cold compress) to affected areas 4-6 times a day. ? Apply a substance that protects your skin and reduces bleeding (astringent). ? Apply a gel that helps relieve pain around sores (lidocaine gel). ? Take a warm, shallow bath that cleans the genital area (sitz bath).  Keep all follow-up visits as told by your health care provider. This is important. How is this prevented?  Use condoms. Although anyone can get genital herpes during sexual contact, even with the use of a condom, a condom can provide some protection.  Avoid having multiple sexual partners.  Talk with your sexual partner about any symptoms either of you may have. Also, talk with your partner about any history of STIs.  Get tested for STIs before you have sex. Ask your partner to do the same.  Do not have sexual contact if you have symptoms of genital herpes. Contact a health care provider if:  Your symptoms are not improving with medicine.  Your symptoms return.  You have new symptoms.  You have a fever.  You have abdominal pain.  You have redness, swelling, or pain in your eye.  You notice new sores on other parts of your body.  You are a woman and experience bleeding between menstrual periods.  You have had herpes and you become pregnant or plan to become pregnant. Summary  Genital herpes is a common sexually transmitted infection (STI) that is caused by the herpes simplex virus (HSV) type 1 or type 2.  These viruses are most often spread through sexual contact with an infected person.  You are more likely to develop this condition if you have sex with many partners or you have unprotected sex.  Most people do not have symptoms (asymptomatic) or have mild symptoms that may be mistaken for other skin  problems. Symptoms occur as outbreaks that may happen months or years apart.  There is no cure for this condition, but treatment with oral antiviral medicines can reduce symptoms, reduce the chance of spreading the virus to a partner, prevent future outbreaks, or shorten future outbreaks. This  information is not intended to replace advice given to you by your health care provider. Make sure you discuss any questions you have with your health care provider. Document Revised: 11/29/2017 Document Reviewed: 04/24/2016 Elsevier Patient Education  [place].

## 2019-11-30 NOTE — MAU Provider Note (Signed)
History     CSN: [id number]  Arrival date and time: 11/30/19 1315   First Provider Initiated Contact with Patient 11/30/19 1357      Chief Complaint  Patient presents with   Heartburn   Emesis   HPI  [name] is a 24 y.o. G1P0 at [redacted]w[redacted]d who presents to MAU today with complaint of N/V and vaginal irritation. The patient has been seen for N/V frequently throughout the pregnancy. She has hyperemesis and has been getting infusions of anti-emetics and fluids 3 times/week for the last month. She states that she doesn't feel that is still necessary, but she was told by her provider to come to MAU anytime she felt dehydrated. She has Rx for Phenergan, Zofran, Protonix, scopalamine patches. She has refills available for all of these but hasn't taken them today. She feels the baby is "used to" the infusions and she should get fluids today because she had emesis x 4 this morning. She also states vaginal irritation today with a spot of blood on the tissue earlier today. She denies recent intercourse or a history of HSV.   OB History     Gravida  1   Para      Term      Preterm      AB      Living         SAB      TAB      Ectopic      Multiple      Live Births              Past Medical History:  Diagnosis Date   Asthma    Back pain    Headache     Past Surgical History:  Procedure Laterality Date   NO PAST SURGERIES      Family History  Problem Relation Age of Onset   Migraines Mother     Social History   Tobacco Use   Smoking status: Never Smoker   Smokeless tobacco: Never Used  Vaping Use   Vaping Use: Never used  Substance Use Topics   Alcohol use: No    Alcohol/week: 0.0 standard drinks   Drug use: No    Allergies: No Known Allergies  No medications prior to admission.    Review of Systems  Constitutional: Negative for fever.  Gastrointestinal: Positive for nausea and vomiting. Negative for abdominal pain, constipation and diarrhea.   Genitourinary: Positive for vaginal bleeding, vaginal discharge and vaginal pain. Negative for dysuria, frequency and urgency.   Physical Exam   Blood pressure (!) 106/59, pulse 62, temperature 98.5 F (36.9 C), resp. rate 12, last menstrual period 07/05/2019, SpO2 100 %.  Physical Exam  Nursing note and vitals reviewed. Constitutional: She is oriented to person, place, and time. She appears well-developed. No distress.  HENT:  Head: Normocephalic and atraumatic.  Cardiovascular: Normal rate.  Respiratory: Effort normal.  GI: Soft. She exhibits no distension and no mass. There is no abdominal tenderness. There is no rebound and no guarding.  Genitourinary:    There is lesion on the right labia. There is lesion on the left labia. Uterus is enlarged. Uterus is not tender. Cervix exhibits no discharge and no friability.    Vaginal discharge (small, thick, white) present.     No vaginal bleeding.  No bleeding in the vagina.  Neurological: She is alert and oriented to person, place, and time.  Skin: Skin is warm and dry. No erythema.  Results for orders placed or performed during the hospital encounter of 11/30/19 (from the past 24 hour(s))  Urinalysis, Routine w reflex microscopic     Status: Abnormal   Collection Time: 11/30/19  1:47 PM  Result Value Ref Range   Color, Urine YELLOW YELLOW   APPearance CLEAR CLEAR   Specific Gravity, Urine 1.028 1.005 - 1.030   pH 6.0 5.0 - 8.0   Glucose, UA NEGATIVE NEGATIVE mg/dL   Hgb urine dipstick NEGATIVE NEGATIVE   Bilirubin Urine NEGATIVE NEGATIVE   Ketones, ur NEGATIVE NEGATIVE mg/dL   Protein, ur 30 (A) NEGATIVE mg/dL   Nitrite NEGATIVE NEGATIVE   Leukocytes,Ua MODERATE (A) NEGATIVE   RBC / HPF 0-5 0 - 5 RBC/hpf   WBC, UA 6-10 0 - 5 WBC/hpf   Bacteria, UA RARE (A) NONE SEEN   Squamous Epithelial / LPF 0-5 0 - 5   Mucus PRESENT   Wet prep, genital     Status: Abnormal   Collection Time: 11/30/19  2:08 PM  Result Value Ref Range    Yeast Wet Prep HPF POC NONE SEEN NONE SEEN   Trich, Wet Prep NONE SEEN NONE SEEN   Clue Cells Wet Prep HPF POC PRESENT (A) NONE SEEN   WBC, Wet Prep HPF POC MANY (A) NONE SEEN   Sperm NONE SEEN     MAU Course  Procedures None  MDM FHR - 140 bpm with doppler  IV LR bolus with 4 mg Zofran, 20 mg Pepcid IV  Assessment and Plan  A: SIUP at [redacted]w[redacted]d Hyperemesis  Vaginal lesion   P: Discharge home Rx for Valtrex sent to patient's pharmacy HSV culture pending  Patient advised to pick up refills on anti-emetics and consider taking them on schedule instead of PRN to avoid worsening symptoms Second trimester precautions discussed Patient advised to follow-up with [facility] as scheduled next week for routine prenatal care Patient may return to MAU as needed or if her condition were to change or worsen   Kerry Hough, PA-C 11/30/2019, 8:30 PM

## 2019-12-02 LAB — HSV CULTURE AND TYPING

## 2019-12-04 ENCOUNTER — Encounter: Payer: Self-pay | Admitting: Medical

## 2019-12-04 NOTE — BH Specialist Note (Signed)
Integrated Behavioral Health via Telemedicine Video (Caregility) Visit  12/04/2019 [name] [id number]  Number of [place] visits: 1 Session Start time: 9:18  Session End time: 10:11 Total time: 12  Referring Provider: Hansel Feinstein, CNM Type of Visit: Video Patient/Family location: Home [facility] Provider location: Center for [facility]  All persons participating in visit: Patient Vicki Harper and [place]    Confirmed patient's address: Yes  Confirmed patient's phone number: Yes  Any changes to demographics: No   Confirmed patient's insurance: Yes  Any changes to patient's insurance: No   Discussed confidentiality: Yes   I connected with Vicki Harper by a video enabled telemedicine application ([place]) and verified that I am speaking with the correct person using two identifiers.     I discussed the limitations of evaluation and management by telemedicine and the availability of in person appointments.  I discussed that the purpose of this visit is to provide behavioral health care while limiting exposure to the novel coronavirus.   Discussed there is a possibility of technology failure and discussed alternative modes of communication if that failure occurs.  I discussed that engaging in this virtual visit, they consent to the provision of behavioral healthcare and the services will be billed under their insurance.  Patient and/or legal guardian expressed understanding and consented to virtual visit: Yes   PRESENTING CONCERNS: Patient and/or family reports the following symptoms/concerns: Pt states her primary concern is wanting to prevent escalation of depression during pregnancy and postpartum, as she has hx of depression in the past (SI 5 years ago; no current SI, no intent, no plan). Pt is open to ongoing [facility] visits and [place] medication.  Duration of problem: Symptoms began 5 years ago(severe for a  brief time); increase in current first pregnancy ; Severity of problem: moderate  STRENGTHS (Protective Factors/Coping Skills): Strong social/family support; open to treatment  GOALS ADDRESSED: Patient will: 1.  Reduce symptoms of: anxiety and depression  2.  Increase knowledge and/or ability of: healthy habits  3.  Demonstrate ability to: Increase healthy adjustment to current life circumstances  INTERVENTIONS: Interventions utilized:  Veterinary surgeon and Psychoeducation and/or Health Education Standardized Assessments completed: C-SSRS Short, GAD-7 and PHQ 9  ASSESSMENT: Patient currently experiencing Adjustment disorder with mixed anxiety and depressed mood.   Patient may benefit from psychoeducation and brief therapeutic interventions regarding coping with symptoms of depression and anxiety .  PLAN: 1. Follow up with behavioral health clinician on : Two weeks. Call Vicki Harper at [phone] if needed prior to scheduled visit.(will be contacted via phone or MyChart, if any changes are made to medication) 2. Behavioral recommendations:  -Continue taking prenatal with iron daily, as recommended by medical provider -Follow safety plan, if SI (suicidal ideation) returns -Continue going to the park 3x/week (if unable to go due to weather, consider enjoyable indoor activities) -Consider apps, as discussed, for additional self-care 3. Referral(s): [place] (In Clinic)  I discussed the assessment and treatment plan with the patient and/or parent/guardian. They were provided an opportunity to ask questions and all were answered. They agreed with the plan and demonstrated an understanding of the instructions.   They were advised to call back or seek an in-person evaluation if the symptoms worsen or if the condition fails to improve as anticipated.  [name] [name]  Depression screen [facility] 2/9 12/18/2019  Decreased Interest 1  Down, Depressed, Hopeless 2   PHQ - 2 Score 3  Altered sleeping 0  Tired, decreased energy 2  Change in appetite 0  Feeling bad or failure about yourself  2  Trouble concentrating 0  Moving slowly or fidgety/restless 0  Suicidal thoughts 1  PHQ-9 Score 8   GAD 7 : Generalized Anxiety Score 12/18/2019  Nervous, Anxious, on Edge 1  Control/stop worrying 2  Worry too much - different things 2  Trouble relaxing 1  Restless 0  Easily annoyed or irritable 3  Afraid - awful might happen 0  Total GAD 7 Score 9

## 2019-12-05 ENCOUNTER — Encounter: Payer: Self-pay | Admitting: Advanced Practice Midwife

## 2019-12-05 ENCOUNTER — Other Ambulatory Visit: Payer: Self-pay

## 2019-12-05 ENCOUNTER — Ambulatory Visit (INDEPENDENT_AMBULATORY_CARE_PROVIDER_SITE_OTHER): Payer: Medicaid Other | Admitting: Advanced Practice Midwife

## 2019-12-05 VITALS — BP 98/59 | HR 79 | Wt 150.0 lb

## 2019-12-05 DIAGNOSIS — O210 Mild hyperemesis gravidarum: Secondary | ICD-10-CM

## 2019-12-05 DIAGNOSIS — Z3400 Encounter for supervision of normal first pregnancy, unspecified trimester: Secondary | ICD-10-CM

## 2019-12-05 DIAGNOSIS — Z3A21 21 weeks gestation of pregnancy: Secondary | ICD-10-CM

## 2019-12-05 NOTE — Patient Instructions (Signed)

## 2019-12-05 NOTE — Progress Notes (Signed)
   PRENATAL VISIT NOTE  Subjective:  Vicki Harper is a 24 y.o. G1P0 at [redacted]w[redacted]d being seen today for ongoing prenatal care.  She is currently monitored for the following issues for this low-risk pregnancy and has Chronic pain of left knee; Supervision of normal first pregnancy, antepartum; Hyperemesis gravidarum; LGSIL on Pap smear of cervix; and Asthma affecting pregnancy in first trimester on their problem list.  Patient reports heartburn and nausea. Heartburn is more of an issue lately.  Happens mostly at night. Takes Protonix in am. Does drink milk prior to bedtime.   Contractions: Not present. Vag. Bleeding: None.  Movement: Present. Denies leaking of fluid.   The following portions of the patient's history were reviewed and updated as appropriate: allergies, current medications, past family history, past medical history, past social history, past surgical history and problem list.   Objective:   Vitals:   12/05/19 1045  BP: (!) 98/59  Pulse: 79  Weight: 150 lb 0.6 oz (68.1 kg)    Fetal Status: Fetal Heart Rate (bpm): 128   Movement: Present     General:  Alert, oriented and cooperative. Patient is in no acute distress.  Skin: Skin is warm and dry. No rash noted.   Cardiovascular: Normal heart rate noted  Respiratory: Normal respiratory effort, no problems with respiration noted  Abdomen: Soft, gravid, appropriate for gestational age.  Pain/Pressure: Absent     Pelvic: Cervical exam deferred        Extremities: Normal range of motion.  Edema: Trace  Mental Status: Normal mood and affect. Normal behavior. Normal judgment and thought content.   Assessment and Plan:  Pregnancy: G1P0 at [redacted]w[redacted]d 1. Hyperemesis gravidarum     Continue meds prn and advance diet as tolerated     Discussed stopping milk (may be intolerance), try 1-2 TUMS before bed (will contribute to calcium, so milk not essential).        Good weight gain.  2. Supervision of normal first pregnancy, antepartum       glucola next month  Preterm labor symptoms and general obstetric precautions including but not limited to vaginal bleeding, contractions, leaking of fluid and fetal movement were reviewed in detail with the patient. Please refer to After Visit Summary for other counseling recommendations.   Return in about 8 weeks (around 01/30/2020) for [facility]  Future Appointments  Date Time Provider [place]  12/18/2019  9:15 AM [place] [facility]  01/30/2020 10:10 AM Seabron Spates, CNM [facility] None    Hansel Feinstein, CNM

## 2019-12-18 ENCOUNTER — Ambulatory Visit (INDEPENDENT_AMBULATORY_CARE_PROVIDER_SITE_OTHER): Payer: Medicaid Other | Admitting: Clinical

## 2019-12-18 ENCOUNTER — Other Ambulatory Visit: Payer: Self-pay

## 2019-12-18 DIAGNOSIS — O99340 Other mental disorders complicating pregnancy, unspecified trimester: Secondary | ICD-10-CM | POA: Diagnosis not present

## 2019-12-18 DIAGNOSIS — Z3A00 Weeks of gestation of pregnancy not specified: Secondary | ICD-10-CM | POA: Diagnosis not present

## 2019-12-18 DIAGNOSIS — F4323 Adjustment disorder with mixed anxiety and depressed mood: Secondary | ICD-10-CM | POA: Diagnosis not present

## 2019-12-18 NOTE — Patient Instructions (Signed)
Center for [facility] at [facility] [place], [place] [address] [phone] (main office) [phone] ([place] office)  [place]:   What if I or someone I know is in crisis?  . If you are thinking about harming yourself or having thoughts of suicide, or if you know someone who is, seek help right away.  . Call your doctor or mental health care provider.  . Call 911 or go to a hospital emergency room to get immediate help, or ask a friend or family member to help you do these things.  . Call the [place] National Suicide Prevention Lifeline's toll-free, 24-hour hotline at 1-800-273-TALK 828-291-8145) or TTY: 1-800-799-4 TTY (413)754-4395) to talk to a trained counselor.  . If you are in crisis, make sure you are not left alone.   . If someone else is in crisis, make sure he or she is not left alone   24 Hour :   [place] National Suicide Hotline: 910-226-4629  Therapeutic Alternative Mobile Crisis: 9716480585   [facility]  [address], [place], [place] [address]  [phone] or [phone]  [facility] the [employer] (Domestic Violence, Rape & Victim Assistance)  [phone]  [place]  [address], [place]  [address]   [phone] or [phone]   [place]: [phone] (8am-4pm) or [phone] (after hours)           [facility], [address], [place], [place] Fax: [phone] www.[email or web address]  *Interpreters available *Accepts Medicaid, Medicare, uninsured  [facility]   Mon-Fri: 8am-5pm [address] [place], [place] [phone](phone); [phone]) BloggerCourse.com  *Accepts Medicare  [facility] [place], Fri:  8am-4pm [address] [place], [place] (phone); [phone] (fax) TaskTown.es  *[place] Mon-Fri: 9am-5pm  [address] [place], [place] (phone); [phone]  [email or web address]  *Accepts Medicaid  [facility] [address], [place], [place]  SalonLookup.es   [facility] [place], 8:30am-12pm/1pm-2:30pm [address], [place], [place] (phone); [phone] (fax) www.[email or web address]  *Accepts Medicaid, sliding-scale*Bilingual services available  [facility] Mon-Fri, 8am-7pm [place], [place]  [phone](phone); [phone]) www.[email or web address]  *Accepts Medicaid *Bilingual services available  [facility] Mon-Fri: 8am-5pm, Saturday by appointment only [place], [place], [place] (phone); [phone] (fax) [email or web address]   [facility] [address] [place], [place], [place] [email or web address]  *Free & reduced services for uninsured and underinsured individuals *Bilingual services for Spanish-speaking clients 21 and under  [facility], [address], [place], [place]); [phone]) RunningConvention.de  *Bring your own interpreter at first visit *Accepts Medicare and [facility]  [place] Mon-Fri: 9am-5:30pm [address], [place], [place], [place] (phone), [phone] (fax) After hours crisis line: [phone] www.[email or web address]  *Accepts Medicare and Medicaid  [facility], 8am-6pm [address], [place], [place] (phone); [phone]  (fax) http://[email or web address]  *Subsidized costs available  [facility] Mon-Fri: 8am-4pm [address], [place], [place] [phone](phone); [phone]) www.p[email or web address]  *Accepts Medicaid  [facility] Same day access hours: Mon-Fri, 8:30-3pm Crisis hours: Mon-Fri, 8am-5pm [place], [place] Same day access hours: Mon-Fri, 8:30-3pm Crisis hours: Mon-Fri, 8am-8pm [address], [place], [place] (phone);  [phone] (fax) www.[email or web address]  *Accepts Medicaid and Medicare  [facility] [place], [place], Fri: 9am-9pm Tues, Thurs: 9am-6pm [place], [place], [place] (phone); [phone] (fax) https://[email or web address]  *(Accepts Medicare and Medicaid; payment plans available)*Bilingual services available  [facility] [address] [place], [place] (phone); [phone] (fax) [email or web address] [address], [place], [place], [place]  [email or web address]  *Bilingual services available  [facility]otional [facility]) Mon-Thurs: 8am-8pm [address] [place], [place], [place] (phone); [phone] (fax) [email or web address]  *Accepts Medicaid*Bilingual services available  [place] [address] [place], [place], [place] (phone) DeadConnect.[email or web address]  *Accepts Medicaid *Bilingual services available  [facility] Mon-Fri, 9am-4:45pm [address], [place], [place] (phone); [phone] (fax) http://[email or web address]  *Accepts Medicare  [facility] Mon-Thurs: 8:30-8pm, Fri: 8:30am-7pm [address] [place], [place] (3rd floor) [phone] (phone); [phone]  (fax) VIPinterview.si  *Accepts Medicaid; income-based reduced rates available  [facility] Mon-Fri: 8am-5pm [address], [place], [place], [place] (phone); [phone] (fax) http://[email or web address]  *Accepts Medicaid*Bilingual services available  [place], [place], [place], [place] (phone); [phone] (fax) www.[email or web address]  *Free emergency housing and clinical services for youth in crisis  [facility] ([place])  [address] [place] [phone] www.[email or web address]  *Provides direct services to individuals in recovery from mental illness, including support groups, recovery skills classes, and one on one peer support  [employer] on [place]) [place] helpline: [phone]  https://namiguilford.org  *A community hub for information relating to local resources and services for the friends and families of individuals living alongside a mental health condition, as well as the individuals themselves. Classes and support groups also provided   /Emotional [employer] and Websites Here are a few free apps meant to help you to help yourself.  To find, try searching on the internet to see if the app is offered on Apple/Android devices. If your first choice doesn't come up on your device, the good news is that there are many choices! Play around with different apps to see which ones are helpful to you.    Calm This is an app meant to help increase calm feelings. Includes info, strategies, and tools for tracking your feelings.      Calm Harm  This app is meant to help with self-harm. Provides many 5-minute or 15-min coping strategies for doing instead of hurting yourself.       Damar is a problem-solving tool to help deal with emotions and cope with stress you encounter wherever you are.      MindShift This  app can help people cope with anxiety. Rather than trying to avoid anxiety, you can make an important shift and face it.      MY3  MY3 features a support system, safety plan and resources with the goal of offering a tool to use in a time of need.       My Life My Voice  This mood journal offers a simple solution for tracking your thoughts, feelings and moods. Animated emoticons can help identify your mood.       Relax Melodies Designed to help with sleep, on this app you can mix sounds and meditations for relaxation.      Smiling Mind Smiling Mind is meditation made easy: it's a simple tool that helps put a smile on your mind.  Stop, Breathe & Think  A friendly, simple guide for people through meditations for mindfulness and compassion.  Stop, Breathe and Think Kids Enter your current feelings and choose a "mission" to help you cope. Offers videos for certain moods instead of just sound recordings.       Team Orange The goal of this tool is to help teens change how they think, act, and react. This app helps you focus on your own good feelings and experiences.      The [place] Box The [place] Box (VHB) contains simple tools to help patients with coping, relaxation, distraction, and positive thinking.

## 2019-12-19 ENCOUNTER — Encounter (HOSPITAL_COMMUNITY): Payer: Self-pay | Admitting: Family Medicine

## 2019-12-19 ENCOUNTER — Other Ambulatory Visit: Payer: Self-pay

## 2019-12-19 ENCOUNTER — Inpatient Hospital Stay (HOSPITAL_COMMUNITY)
Admission: AD | Admit: 2019-12-19 | Discharge: 2019-12-19 | Disposition: A | Discharge status: Home / Self Care | Payer: Medicaid Other | Attending: Family Medicine | Admitting: Family Medicine

## 2019-12-19 DIAGNOSIS — O219 Vomiting of pregnancy, unspecified: Secondary | ICD-10-CM | POA: Diagnosis not present

## 2019-12-19 DIAGNOSIS — O212 Late vomiting of pregnancy: Secondary | ICD-10-CM | POA: Diagnosis present

## 2019-12-19 DIAGNOSIS — J45909 Unspecified asthma, uncomplicated: Secondary | ICD-10-CM | POA: Insufficient documentation

## 2019-12-19 DIAGNOSIS — R12 Heartburn: Secondary | ICD-10-CM | POA: Diagnosis not present

## 2019-12-19 DIAGNOSIS — O26892 Other specified pregnancy related conditions, second trimester: Secondary | ICD-10-CM | POA: Insufficient documentation

## 2019-12-19 DIAGNOSIS — O99512 Diseases of the respiratory system complicating pregnancy, second trimester: Secondary | ICD-10-CM | POA: Insufficient documentation

## 2019-12-19 DIAGNOSIS — R1030 Lower abdominal pain, unspecified: Secondary | ICD-10-CM | POA: Insufficient documentation

## 2019-12-19 DIAGNOSIS — Z3689 Encounter for other specified antenatal screening: Secondary | ICD-10-CM | POA: Insufficient documentation

## 2019-12-19 DIAGNOSIS — Z3A23 23 weeks gestation of pregnancy: Secondary | ICD-10-CM | POA: Insufficient documentation

## 2019-12-19 DIAGNOSIS — Z79899 Other long term (current) drug therapy: Secondary | ICD-10-CM | POA: Diagnosis not present

## 2019-12-19 LAB — URINALYSIS, ROUTINE W REFLEX MICROSCOPIC
Bilirubin Urine: NEGATIVE
Glucose, UA: NEGATIVE mg/dL
Hgb urine dipstick: NEGATIVE
Ketones, ur: 5 mg/dL — AB
Leukocytes,Ua: NEGATIVE
Nitrite: NEGATIVE
Protein, ur: 30 mg/dL — AB
Specific Gravity, Urine: 1.033 — ABNORMAL HIGH (ref 1.005–1.030)
pH: 6 (ref 5.0–8.0)

## 2019-12-19 MED ORDER — LACTATED RINGERS IV BOLUS
1000.0000 mL | Freq: Once | INTRAVENOUS | Status: AC
  Administered 2019-12-19: 1000 mL via INTRAVENOUS

## 2019-12-19 MED ORDER — SODIUM CHLORIDE 0.9 % IV SOLN
8.0000 mg | Freq: Once | INTRAVENOUS | Status: AC
  Administered 2019-12-19: 8 mg via INTRAVENOUS
  Filled 2019-12-19: qty 4

## 2019-12-19 MED ORDER — FAMOTIDINE 20 MG PO TABS
20.0000 mg | ORAL_TABLET | Freq: Two times a day (BID) | ORAL | 0 refills | Status: DC
Start: 2019-12-19 — End: 2020-03-21

## 2019-12-19 MED ORDER — FAMOTIDINE IN NACL 20-0.9 MG/50ML-% IV SOLN
20.0000 mg | Freq: Once | INTRAVENOUS | Status: AC
  Administered 2019-12-19: 20 mg via INTRAVENOUS
  Filled 2019-12-19: qty 50

## 2019-12-19 NOTE — MAU Note (Signed)
Pt states that she has had little to no urinating today. Pt states that she can tell she is dehydrated.   Pt reports lower abdominal cramping.   Denies vaginal bleeding. Denies LOF   Pt reports not being able to keep anything down even with the zofran and phenergan tablets.  Pt reports "the only thing she lets me keep down is ice"

## 2019-12-19 NOTE — MAU Provider Note (Signed)
History     CSN: [id number]  Arrival date and time: 12/19/19 1719   First Provider Initiated Contact with Patient 12/19/19 1806      Chief Complaint  Patient presents with  . Emesis   Vicki Harper is a 24 y.o. G1P0 at [redacted]w[redacted]d who receives care at [facility].  She presents today for Emesis.  Patient states she has had 3 incidents of vomiting today, but none yesterday.  Patient reports she feels like she is dehydrated because her lips have been dry and chapped despite water intake.  Patient states she feels her vomiting has been from "stomach acids" and not food intake.  Patient reports she took Zofran and Phenergan (12.5mg ) at 1300 and has a scopolamine patch in place.  Patient endorses fetal movement and denies abdominal contractions, but reports some abdominal cramping throughout the day.  Patient denies vaginal concerns including discharge, leaking, or bleeding.    Biscuits and Eggs  [employer] with Meat Sauce  Snacks; Strawberry, Oranges  Water    OB History    Gravida  1   Para      Term      Preterm      AB      Living        SAB      TAB      Ectopic      Multiple      Live Births              Past Medical History:  Diagnosis Date  . Asthma   . Back pain   . Headache     Past Surgical History:  Procedure Laterality Date  . NO PAST SURGERIES      Family History  Problem Relation Age of Onset  . Migraines Mother     Social History   Tobacco Use  . Smoking status: Never Smoker  . Smokeless tobacco: Never Used  Vaping Use  . Vaping Use: Never used  Substance Use Topics  . Alcohol use: No    Alcohol/week: 0.0 standard drinks  . Drug use: No    Allergies: No Known Allergies  Medications Prior to Admission  Medication Sig Dispense Refill Last Dose  . albuterol (PROVENTIL HFA;VENTOLIN HFA) 108 (90 BASE) MCG/ACT inhaler Inhale 1-2 puffs into the lungs every 6 (six) hours as needed for wheezing or shortness of breath. 1 Inhaler 0  Past Month at Unknown time  . Elastic Bandages & Supports (COMFORT FIT MATERNITY SUPP MED) MISC Wear belt during the day, as needed, and remove at night prior to bedtime. 1 each 0 Past Month at Unknown time  . ondansetron (ZOFRAN-ODT) 4 MG disintegrating tablet Take 2 tablets (8 mg total) by mouth every 8 (eight) hours as needed. 30 tablet 2 12/19/2019 at Unknown time  . promethazine (PHENERGAN) 12.5 MG tablet Take 12.5 mg by mouth every 6 (six) hours as needed for nausea or vomiting.   12/19/2019 at Unknown time  . scopolamine (TRANSDERM-SCOP) 1 MG/3DAYS Place 1 patch (1.5 mg total) onto the skin every 3 (three) days. 10 patch 1 12/19/2019 at Unknown time  . albuterol (PROVENTIL) (2.5 MG/3ML) 0.083% nebulizer solution Take 2.5 mg by nebulization every 6 (six) hours as needed for wheezing or shortness of breath. (Patient not taking: Reported on 12/05/2019)     . albuterol (VENTOLIN) (5 MG/ML) 0.5% NEBU Inhale into the lungs. (Patient not taking: Reported on 12/05/2019)     . glycopyrrolate (ROBINUL) 1 MG tablet Take 1 tablet (  1 mg total) by mouth 3 (three) times daily. (Patient not taking: Reported on 10/10/2019) 90 tablet 0   . pantoprazole (PROTONIX) 20 MG tablet Take 1 tablet (20 mg total) by mouth daily. (Patient not taking: Reported on 12/05/2019) 30 tablet 2     Review of Systems  Constitutional: Negative for chills and fever.  Respiratory: Negative for cough.   Gastrointestinal: Positive for abdominal pain (Cramping) and nausea. Negative for vomiting.  Genitourinary: Negative for difficulty urinating, dysuria, vaginal bleeding and vaginal discharge.  Neurological: Negative for dizziness, light-headedness and headaches.   Physical Exam   Blood pressure (!) 99/58, pulse 82, temperature 98.1 F (36.7 C), temperature source Oral, resp. rate 16, last menstrual period 07/05/2019, SpO2 99 %.  Physical Exam Constitutional:      Appearance: Normal appearance. She is normal weight.  HENT:     Head:  Normocephalic and atraumatic.  Eyes:     Conjunctiva/sclera: Conjunctivae normal.     Pupils: Pupils are equal, round, and reactive to light.  Cardiovascular:     Rate and Rhythm: Normal rate and regular rhythm.     Heart sounds: Normal heart sounds.  Pulmonary:     Effort: Pulmonary effort is normal. No respiratory distress.     Breath sounds: Normal breath sounds.  Abdominal:     General: Bowel sounds are normal.     Palpations: Abdomen is soft.     Comments: Gravid--fundal height appears AGA, Soft, NT   Musculoskeletal:        General: Normal range of motion.     Cervical back: Normal range of motion.  Skin:    General: Skin is warm and dry.  Neurological:     Mental Status: She is alert and oriented to person, place, and time.  Psychiatric:        Mood and Affect: Mood normal.        Behavior: Behavior normal.        Thought Content: Thought content normal.     Fetal Assessment 140 bpm, Mod Var, -Decels, +10x10 Accels Toco: None graphed  MAU Course   Results for orders placed or performed during the hospital encounter of 12/19/19 (from the past 24 hour(s))  Urinalysis, Routine w reflex microscopic     Status: Abnormal   Collection Time: 12/19/19  5:44 PM  Result Value Ref Range   Color, Urine YELLOW YELLOW   APPearance HAZY (A) CLEAR   Specific Gravity, Urine 1.033 (H) 1.005 - 1.030   pH 6.0 5.0 - 8.0   Glucose, UA NEGATIVE NEGATIVE mg/dL   Hgb urine dipstick NEGATIVE NEGATIVE   Bilirubin Urine NEGATIVE NEGATIVE   Ketones, ur 5 (A) NEGATIVE mg/dL   Protein, ur 30 (A) NEGATIVE mg/dL   Nitrite NEGATIVE NEGATIVE   Leukocytes,Ua NEGATIVE NEGATIVE   RBC / HPF 0-5 0 - 5 RBC/hpf   WBC, UA 0-5 0 - 5 WBC/hpf   Bacteria, UA FEW (A) NONE SEEN   Squamous Epithelial / LPF 0-5 0 - 5   Mucus PRESENT    Budding Yeast PRESENT    No results found.  MDM PE Labs: UA Start IV LR Bolus Antiemetic PPI EFM Assessment and Plan  24 year old G1P0  SIUP at 23.6weeks Cat  I FT N/V Heartburn  -POC Reviewed. -Discussed initiation of medication for heartburn/gerd which could be contributing to nausea/vomiting. -Start IV give LR -Zofran 8mg  now.  Discussed increasing dosage at home to 8mg  prn. -Will monitor and reassess. -NST Reactive-D/C monitoring.  [name] MSN, CNM 12/19/2019, 6:06 PM   Reassessment (7:14 PM)  Patient reports improvement in symptoms with zofran dosing. Will give pepcid IV now.and will send script for home usage.  Patient requests crackers-given. Will continue to monitor and reassess.   Reassessment (8:35 PM) -Patient reports continued improvement in symptoms, per nurse, and requests discharge. -Rx for pepcid 20mg  sent to pharmacy on file.  -Patient to follow up in office as scheduled. -Encouraged to call or return to MAU if symptoms worsen or with the onset of new symptoms. -Discharged to home in improved condition.  [name] MSN, CNM Advanced Practice Provider, [facility] for [employer] [facility]

## 2019-12-20 ENCOUNTER — Encounter: Payer: Self-pay | Admitting: Obstetrics & Gynecology

## 2019-12-20 ENCOUNTER — Other Ambulatory Visit: Payer: Self-pay | Admitting: Obstetrics & Gynecology

## 2019-12-20 DIAGNOSIS — O99340 Other mental disorders complicating pregnancy, unspecified trimester: Secondary | ICD-10-CM | POA: Insufficient documentation

## 2019-12-20 DIAGNOSIS — F32A Depression, unspecified: Secondary | ICD-10-CM | POA: Insufficient documentation

## 2019-12-20 MED ORDER — SERTRALINE HCL 25 MG PO TABS: 25.0000 mg | ORAL_TABLET | Freq: Every day | ORAL | 1 refills | Status: DC

## 2019-12-21 ENCOUNTER — Telehealth: Payer: Self-pay

## 2019-12-21 LAB — CULTURE, OB URINE: Culture: <10000 — AB

## 2019-12-21 NOTE — Telephone Encounter (Signed)
Patient called and made aware that Zoloft has been called into her pharmacy for her by Dr. Ihor Dow. Patient had appointment with Marthenia Rolling on Monday and has another on scheduled on 01/01/20 which she plans to attend. Kathrene Alu RN

## 2019-12-21 NOTE — Telephone Encounter (Signed)
-----   Message from Lavonia Drafts, MD sent at 12/20/2019  5:31 PM EDT ----- Regarding: FW: depression med Please call pt. Zoloft sent to pharmacy. Best safety profile in pregnancy.   Thx  Clh-S ----- Message ----- From: Seabron Spates, CNM Sent: 12/19/2019  12:56 PM EDT To: Lavonia Drafts, MD Subject: FW: depression med                             I'm on vacation    Can you suggest /Rx something for her?  Thanks Lelan Pons ----- Message ----- From: Barnie Mort Sent: 12/18/2019  11:53 AM EDT To: Seabron Spates, CNM  Pt requesting [place] medication for escalating depression in pregnancy; prefers to start at lowest dose.

## 2019-12-22 ENCOUNTER — Encounter (HOSPITAL_COMMUNITY): Payer: Self-pay | Admitting: Obstetrics and Gynecology

## 2019-12-22 ENCOUNTER — Inpatient Hospital Stay (HOSPITAL_COMMUNITY)
Admission: AD | Admit: 2019-12-22 | Discharge: 2019-12-22 | Disposition: A | Discharge status: Home / Self Care | Payer: Medicaid Other | Attending: Obstetrics and Gynecology | Admitting: Obstetrics and Gynecology

## 2019-12-22 ENCOUNTER — Inpatient Hospital Stay (HOSPITAL_BASED_OUTPATIENT_CLINIC_OR_DEPARTMENT_OTHER): Payer: Medicaid Other

## 2019-12-22 ENCOUNTER — Telehealth: Payer: Self-pay

## 2019-12-22 DIAGNOSIS — O26892 Other specified pregnancy related conditions, second trimester: Secondary | ICD-10-CM | POA: Diagnosis present

## 2019-12-22 DIAGNOSIS — Z3A24 24 weeks gestation of pregnancy: Secondary | ICD-10-CM | POA: Diagnosis not present

## 2019-12-22 DIAGNOSIS — M79606 Pain in leg, unspecified: Secondary | ICD-10-CM | POA: Insufficient documentation

## 2019-12-22 DIAGNOSIS — M7989 Other specified soft tissue disorders: Secondary | ICD-10-CM | POA: Insufficient documentation

## 2019-12-22 DIAGNOSIS — J45909 Unspecified asthma, uncomplicated: Secondary | ICD-10-CM | POA: Diagnosis not present

## 2019-12-22 DIAGNOSIS — O99512 Diseases of the respiratory system complicating pregnancy, second trimester: Secondary | ICD-10-CM | POA: Diagnosis not present

## 2019-12-22 DIAGNOSIS — Z79899 Other long term (current) drug therapy: Secondary | ICD-10-CM | POA: Diagnosis not present

## 2019-12-22 DIAGNOSIS — O1202 Gestational edema, second trimester: Secondary | ICD-10-CM

## 2019-12-22 LAB — URINALYSIS, ROUTINE W REFLEX MICROSCOPIC
Bilirubin Urine: NEGATIVE
Glucose, UA: NEGATIVE mg/dL
Hgb urine dipstick: NEGATIVE
Ketones, ur: 5 mg/dL — AB
Leukocytes,Ua: NEGATIVE
Nitrite: NEGATIVE
Protein, ur: NEGATIVE mg/dL
Specific Gravity, Urine: 1.019 (ref 1.005–1.030)
pH: 6 (ref 5.0–8.0)

## 2019-12-22 NOTE — Discharge Instructions (Signed)

## 2019-12-22 NOTE — MAU Provider Note (Signed)
Chief Complaint:  Leg Swelling and Leg Pain   First Provider Initiated Contact with Patient 12/22/19 1358     HPI: Vicki Harper is a 24 y.o. G1P0 at [redacted]w[redacted]d who presents to maternity admissions reporting leg swelling & leg pain. Symptoms started this week. Reports both legs have been swollen but the left side is a little bit larger than the right. Has had a few "cramps" in her shin but otherwise no calf pain. Has been feeling short of breath but states it is consistent with her asthma & not a new symptoms. Denies cough or chest pain. No other symptoms.    Past Medical History:  Diagnosis Date  . Asthma   . Back pain   . Headache    OB History  Gravida Para Term Preterm AB Living  1            SAB TAB Ectopic Multiple Live Births               # Outcome Date GA Lbr Len/2nd Weight Sex Delivery Anes PTL Lv  1 Current            Past Surgical History:  Procedure Laterality Date  . NO PAST SURGERIES     Family History  Problem Relation Age of Onset  . Migraines Mother    Social History   Tobacco Use  . Smoking status: Never Smoker  . Smokeless tobacco: Never Used  Vaping Use  . Vaping Use: Never used  Substance Use Topics  . Alcohol use: No    Alcohol/week: 0.0 standard drinks  . Drug use: No   No Known Allergies No medications prior to admission.    I have reviewed patient's Past Medical Hx, Surgical Hx, Family Hx, Social Hx, medications and allergies.   ROS:  Review of Systems  Constitutional: Negative.   Respiratory: Positive for shortness of breath. Negative for chest tightness and wheezing.   Cardiovascular: Positive for leg swelling. Negative for chest pain.  Gastrointestinal: Negative.   Genitourinary: Negative.     Physical Exam   Patient Vitals for the past 24 hrs:  BP Temp Temp src Pulse Resp SpO2 Height Weight  12/22/19 1511 103/60 -- -- 65 15 100 % -- --  12/22/19 1233 102/60 98.1 F (36.7 C) Oral 91 18 99 % 5\' 6"  (1.676 m) 69.4 kg     Constitutional: Well-developed, well-nourished female in no acute distress.  Cardiovascular: normal rate & rhythm, no murmur Respiratory: normal effort, lung sounds clear throughout GI: Abd soft, non-tender, gravid appropriate for gestational age. Pos BS x 4 MS: Extremities nontender, non pitting BLE edema. Calf diameter equal bilaterally (36 cm), normal ROM Neurologic: Alert and oriented x 4.   Fetal Tracing:  Baseline: 135 Variability: moderate Accelerations: none Decelerations: small variable decel x 1  Toco: none    Labs: Results for orders placed or performed during the hospital encounter of 12/22/19 (from the past 24 hour(s))  Urinalysis, Routine w reflex microscopic     Status: Abnormal   Collection Time: 12/22/19 12:30 PM  Result Value Ref Range   Color, Urine YELLOW YELLOW   APPearance CLEAR CLEAR   Specific Gravity, Urine 1.019 1.005 - 1.030   pH 6.0 5.0 - 8.0   Glucose, UA NEGATIVE NEGATIVE mg/dL   Hgb urine dipstick NEGATIVE NEGATIVE   Bilirubin Urine NEGATIVE NEGATIVE   Ketones, ur 5 (A) NEGATIVE mg/dL   Protein, ur NEGATIVE NEGATIVE mg/dL   Nitrite NEGATIVE NEGATIVE   Leukocytes,Ua NEGATIVE  NEGATIVE    Imaging:  VAS [place] LOWER EXTREMITY VENOUS (DVT)  Result Date: 12/22/2019  Lower Venous DVTStudy Indications: Swelling.  Risk Factors: [redacted] weeks pregnant. Performing Technologist: Maudry Mayhew MHA, RDMS, RVT, RDCS  Examination Guidelines: A complete evaluation includes B-mode imaging, spectral Doppler, color Doppler, and power Doppler as needed of all accessible portions of each vessel. Bilateral testing is considered an integral part of a complete examination. Limited examinations for reoccurring indications may be performed as noted. The reflux portion of the exam is performed with the patient in reverse Trendelenburg.  +---------+---------------+---------+-----------+----------+--------------+ RIGHT     CompressibilityPhasicitySpontaneityPropertiesThrombus Aging +---------+---------------+---------+-----------+----------+--------------+ CFV      Full           Yes      Yes                                 +---------+---------------+---------+-----------+----------+--------------+ SFJ      Full                                                        +---------+---------------+---------+-----------+----------+--------------+ FV Prox  Full                                                        +---------+---------------+---------+-----------+----------+--------------+ FV Mid   Full                                                        +---------+---------------+---------+-----------+----------+--------------+ FV DistalFull                                                        +---------+---------------+---------+-----------+----------+--------------+ PFV      Full                                                        +---------+---------------+---------+-----------+----------+--------------+ POP      Full           Yes      Yes                                 +---------+---------------+---------+-----------+----------+--------------+ PTV      Full                                                        +---------+---------------+---------+-----------+----------+--------------+ PERO     Full                                                        +---------+---------------+---------+-----------+----------+--------------+   +---------+---------------+---------+-----------+----------+--------------+  LEFT     CompressibilityPhasicitySpontaneityPropertiesThrombus Aging +---------+---------------+---------+-----------+----------+--------------+ CFV      Full           Yes      Yes                                 +---------+---------------+---------+-----------+----------+--------------+ SFJ      Full                                                         +---------+---------------+---------+-----------+----------+--------------+ FV Prox  Full                                                        +---------+---------------+---------+-----------+----------+--------------+ FV Mid   Full                                                        +---------+---------------+---------+-----------+----------+--------------+ FV DistalFull                                                        +---------+---------------+---------+-----------+----------+--------------+ PFV      Full                                                        +---------+---------------+---------+-----------+----------+--------------+ POP      Full           Yes      Yes                                 +---------+---------------+---------+-----------+----------+--------------+ PTV      Full                                                        +---------+---------------+---------+-----------+----------+--------------+ PERO     Full                                                        +---------+---------------+---------+-----------+----------+--------------+     Summary: RIGHT: - There is no evidence of deep vein thrombosis in the lower extremity.  - No cystic structure found in the popliteal fossa.  LEFT: - There is no evidence of deep vein thrombosis in the lower extremity.  - No  cystic structure found in the popliteal fossa.  *See table(s) above for measurements and observations.    Preliminary     MAU Course: Orders Placed This Encounter  Procedures  . Urinalysis, Routine w reflex microscopic  . ED EKG  . EKG 12-Lead  . Discharge patient  . VAS [place] LOWER EXTREMITY VENOUS (DVT)   No orders of the defined types were placed in this encounter.   MDM: Fetal tracing appropriate for gestational age & patient without OB complaints.   Calves equal bilaterally with non pitting edema. Patient reports "SOB" that is ongoing &  she relates to her asthma. Denies cough or wheezing. Last used her albuterol inhaler last week. SpO2 99% & normal EKG.  Venous doppler study negative for DVT.  Pt reassured.  Discussed use of compression socks & frequent position changes with leg elevation.   Assessment: 1. Swelling of lower extremity during pregnancy in second trimester   2. [redacted] weeks gestation of pregnancy     Plan: Discharge home in stable condition.  Discussed reasons to return to MAU    Allergies as of 12/22/2019   No Known Allergies     Medication List    TAKE these medications   albuterol 108 (90 Base) MCG/ACT inhaler Commonly known as: VENTOLIN HFA Inhale 1-2 puffs into the lungs every 6 (six) hours as needed for wheezing or shortness of breath.   Comfort Fit Maternity Supp Med Misc Wear belt during the day, as needed, and remove at night prior to bedtime.   famotidine 20 MG tablet Commonly known as: PEPCID Take 1 tablet (20 mg total) by mouth 2 (two) times daily.   ondansetron 4 MG disintegrating tablet Commonly known as: ZOFRAN-ODT Take 2 tablets (8 mg total) by mouth every 8 (eight) hours as needed.   prenatal multivitamin Tabs tablet Take 1 tablet by mouth daily at 12 noon.   promethazine 12.5 MG tablet Commonly known as: PHENERGAN Take 12.5 mg by mouth every 6 (six) hours as needed for nausea or vomiting.   scopolamine 1 MG/3DAYS Commonly known as: TRANSDERM-SCOP Place 1 patch (1.5 mg total) onto the skin every 3 (three) days.   sertraline 25 MG tablet Commonly known as: Zoloft Take 1 tablet (25 mg total) by mouth daily.       Jorje Guild, NP 12/22/2019 7:42 PM

## 2019-12-22 NOTE — Telephone Encounter (Signed)
Pt called stating she is having some swelling in her left foot. Pt states she does not recall injuring herself. Per Dr. Ihor Dow, pt should go to MAU. Understanding was voiced.  Dexter Sauser l Geanie Pacifico, CMA

## 2019-12-22 NOTE — MAU Note (Signed)
Pt presents to MAU from OB office due to bilateral leg swelling and feet swelling that she noticed yesterday. She is also having cramping in her left leg and shortness of breath that started today. Pt denies VB and LOF. +FM

## 2019-12-22 NOTE — Progress Notes (Signed)
Bilateral lower extremity venous duplex completed. Refer to "CV Proc" under chart review to view preliminary results.  12/22/2019 2:53 PM Kelby Aline., MHA, RVT, RDCS, RDMS

## 2019-12-25 NOTE — BH Specialist Note (Signed)
Integrated Behavioral Health via Telemedicine Video (Caregility) Visit  12/25/2019 Vicki Harper [id number]  Number of [place] visits: 2 Session Start time: 8:16  Session End time: 8:32 Total time: 16  Referring Provider: Hansel Feinstein, CNM Type of Visit: Video Patient/Family location: Home [facility] Provider location: [facility] [place] [facility]  All persons participating in visit: Patient Vicki Harper and [place]    Confirmed patient's address: Yes  Confirmed patient's phone number: Yes  Any changes to demographics: No   Confirmed patient's insurance: Yes  Any changes to patient's insurance: No   Discussed confidentiality: at previous visit  I connected with [name]  by a video enabled telemedicine application (Caregility) and verified that I am speaking with the correct person using two identifiers.     I discussed the limitations of evaluation and management by telemedicine and the availability of in person appointments.  I discussed that the purpose of this visit is to provide behavioral health care while limiting exposure to the novel coronavirus.   Discussed there is a possibility of technology failure and discussed alternative modes of communication if that failure occurs.  I discussed that engaging in this virtual visit, they consent to the provision of behavioral healthcare and the services will be billed under their insurance.  Patient and/or legal guardian expressed understanding and consented to virtual visit: Yes   PRESENTING CONCERNS: Patient and/or family reports the following symptoms/concerns: Pt states the side effect she is feeling with Zoloft is mild increase in sleepiness, and is taking extra naps as needed; is feeling a decrease in depressive and anxious symptoms. Pt's primary concern today is need for refills of scopolamine patch.  Duration of problem: Increase in current pregnancy (first);  symptoms began 5 years prior(severe briefly at that time); Severity of problem: mild  STRENGTHS (Protective Factors/Coping Skills): Strong social support; adhering to treatment  GOALS ADDRESSED: Patient will: 1.  Reduce symptoms of: anxiety and depression  2.  Demonstrate ability to: Increase motivation to adhere to plan of care  INTERVENTIONS: Interventions utilized:  Medication Monitoring and Psychoeducation and/or Health Education Standardized Assessments completed: GAD-7 and PHQ 9  ASSESSMENT: Patient currently experiencing Adjustment disorder with mixed anxiety and depression.   Patient may benefit from continued psychoeducation and brief therapeutic interventions regarding coping with symptoms of anxiety and depression .  PLAN: 1. Follow up with behavioral health clinician on : One month 2. Behavioral recommendations:  -Continue taking prenatal vitamin as recommended; continue taking Zoloft as prescribed -Continue taking brief 10 minute naps as needed throughout the day -Continue going to the park/outdoors at least 3/x week; indoor physical activity on bad weather days -Continue following safety plan, if SI returns -Read Postpartum Planner to begin planning for postpartum time 3. Referral(s): [place] (In Clinic)  I discussed the assessment and treatment plan with the patient and/or parent/guardian. They were provided an opportunity to ask questions and all were answered. They agreed with the plan and demonstrated an understanding of the instructions.   They were advised to call back or seek an in-person evaluation if the symptoms worsen or if the condition fails to improve as anticipated.  [name] Sabien Umland  Depression screen [facility] 2/9 01/01/2020 12/18/2019  Decreased Interest 1 1  Down, Depressed, Hopeless 1 2  PHQ - 2 Score 2 3  Altered sleeping 0 0  Tired, decreased energy 1 2  Change in appetite 0 0  Feeling bad or failure about  yourself   1 2  Trouble concentrating 0 0  Moving slowly or fidgety/restless 0 0  Suicidal thoughts 0 1  PHQ-9 Score 4 8   GAD 7 : Generalized Anxiety Score 01/01/2020 12/18/2019  Nervous, Anxious, on Edge 0 1  Control/stop worrying 1 2  Worry too much - different things 1 2  Trouble relaxing 0 1  Restless 0 0  Easily annoyed or irritable 2 3  Afraid - awful might happen 1 0  Total GAD 7 Score 5 9

## 2019-12-26 ENCOUNTER — Other Ambulatory Visit: Payer: Self-pay

## 2019-12-26 DIAGNOSIS — O210 Mild hyperemesis gravidarum: Secondary | ICD-10-CM

## 2019-12-26 NOTE — Progress Notes (Signed)
Error

## 2019-12-27 ENCOUNTER — Other Ambulatory Visit: Payer: Self-pay | Admitting: Student

## 2019-12-27 ENCOUNTER — Other Ambulatory Visit: Payer: Self-pay

## 2019-12-27 ENCOUNTER — Encounter (HOSPITAL_COMMUNITY): Payer: Self-pay | Admitting: Obstetrics & Gynecology

## 2019-12-27 ENCOUNTER — Inpatient Hospital Stay (HOSPITAL_COMMUNITY)
Admission: AD | Admit: 2019-12-27 | Discharge: 2019-12-27 | Disposition: A | Discharge status: Home / Self Care | Payer: Medicaid Other | Attending: Obstetrics & Gynecology | Admitting: Obstetrics & Gynecology

## 2019-12-27 DIAGNOSIS — J45909 Unspecified asthma, uncomplicated: Secondary | ICD-10-CM | POA: Insufficient documentation

## 2019-12-27 DIAGNOSIS — Z3A25 25 weeks gestation of pregnancy: Secondary | ICD-10-CM | POA: Insufficient documentation

## 2019-12-27 DIAGNOSIS — O210 Mild hyperemesis gravidarum: Secondary | ICD-10-CM

## 2019-12-27 DIAGNOSIS — O99512 Diseases of the respiratory system complicating pregnancy, second trimester: Secondary | ICD-10-CM | POA: Insufficient documentation

## 2019-12-27 DIAGNOSIS — R112 Nausea with vomiting, unspecified: Secondary | ICD-10-CM | POA: Diagnosis present

## 2019-12-27 DIAGNOSIS — Z79899 Other long term (current) drug therapy: Secondary | ICD-10-CM | POA: Insufficient documentation

## 2019-12-27 LAB — URINALYSIS, ROUTINE W REFLEX MICROSCOPIC
Bilirubin Urine: NEGATIVE
Glucose, UA: NEGATIVE mg/dL
Hgb urine dipstick: NEGATIVE
Ketones, ur: 80 mg/dL — AB
Leukocytes,Ua: NEGATIVE
Nitrite: NEGATIVE
Protein, ur: 100 mg/dL — AB
Specific Gravity, Urine: 1.031 — ABNORMAL HIGH (ref 1.005–1.030)
pH: 6 (ref 5.0–8.0)

## 2019-12-27 MED ORDER — ONDANSETRON 4 MG PO TBDP
8.0000 mg | ORAL_TABLET | Freq: Once | ORAL | Status: AC
  Administered 2019-12-27: 8 mg via ORAL
  Filled 2019-12-27: qty 2

## 2019-12-27 MED ORDER — PROMETHAZINE HCL 25 MG/ML IJ SOLN
25.0000 mg | Freq: Once | INTRAMUSCULAR | Status: AC
  Administered 2019-12-27: 25 mg via INTRAVENOUS
  Filled 2019-12-27: qty 1

## 2019-12-27 MED ORDER — SCOPOLAMINE 1 MG/3DAYS TD PT72: 1.0000 | MEDICATED_PATCH | TRANSDERMAL | 1 refills | Status: DC

## 2019-12-27 MED ORDER — ONDANSETRON 4 MG PO TBDP: 8.0000 mg | ORAL_TABLET | Freq: Three times a day (TID) | ORAL | 2 refills | Status: DC | PRN

## 2019-12-27 MED ORDER — LACTATED RINGERS IV BOLUS
1000.0000 mL | Freq: Once | INTRAVENOUS | Status: AC
  Administered 2019-12-27: 1000 mL via INTRAVENOUS

## 2019-12-27 MED ORDER — SCOPOLAMINE 1 MG/3DAYS TD PT72
1.0000 | MEDICATED_PATCH | TRANSDERMAL | Status: DC
  Administered 2019-12-27: 1.5 mg via TRANSDERMAL
  Filled 2019-12-27: qty 1

## 2019-12-27 NOTE — Discharge Instructions (Signed)
Pick up Scop patch at [address], when you pick them up, request that they order more.

## 2019-12-27 NOTE — MAU Note (Signed)
Pt reports not being able to keep anything down for the last two days.   Pt reports only being able to eat ice.  Denies vaginal bleeding or LOF.   Reports +FM

## 2019-12-27 NOTE — MAU Provider Note (Signed)
Patient Vicki Harper is a 24 y.o. G1P0  at [redacted]w[redacted]d here with complaints of nausea and vomiting. She denies VB, decreased fetal movements, LOF, contractions. She has a history of frequent visits to the MAU for nausea   History     CSN: [id number]  Arrival date and time: 12/27/19 1647   None     Chief Complaint  Patient presents with  . Emesis   Emesis  This is a chronic problem. The current episode started today. The emesis has an appearance of bile ("brown stuff"). There has been no fever. Pertinent negatives include no abdominal pain, coughing, diarrhea, dizziness, fever or headaches.   Reports that her scop patch, the phenergan, and the Zofran combination works well. She has tried to refill Scop patch, but pharmacy said it was "not authorized". She has been waiting for a call back from clinic about this.    OB History    Gravida  1   Para      Term      Preterm      AB      Living        SAB      TAB      Ectopic      Multiple      Live Births              Past Medical History:  Diagnosis Date  . Asthma   . Back pain   . Headache     Past Surgical History:  Procedure Laterality Date  . NO PAST SURGERIES      Family History  Problem Relation Age of Onset  . Migraines Mother     Social History   Tobacco Use  . Smoking status: Never Smoker  . Smokeless tobacco: Never Used  Vaping Use  . Vaping Use: Never used  Substance Use Topics  . Alcohol use: No    Alcohol/week: 0.0 standard drinks  . Drug use: No    Allergies: No Known Allergies  Medications Prior to Admission  Medication Sig Dispense Refill Last Dose  . albuterol (PROVENTIL HFA;VENTOLIN HFA) 108 (90 BASE) MCG/ACT inhaler Inhale 1-2 puffs into the lungs every 6 (six) hours as needed for wheezing or shortness of breath. 1 Inhaler 0 Past Month at Unknown time  . Elastic Bandages & Supports (COMFORT FIT MATERNITY SUPP MED) MISC Wear belt during the day, as needed, and remove at night  prior to bedtime. 1 each 0 Past Week at Unknown time  . famotidine (PEPCID) 20 MG tablet Take 1 tablet (20 mg total) by mouth 2 (two) times daily. 30 tablet 0 12/27/2019 at Unknown time  . ondansetron (ZOFRAN-ODT) 4 MG disintegrating tablet Take 2 tablets (8 mg total) by mouth every 8 (eight) hours as needed. 30 tablet 2 12/27/2019 at Unknown time  . Prenatal Vit-Fe Fumarate-FA (PRENATAL MULTIVITAMIN) TABS tablet Take 1 tablet by mouth daily at 12 noon.   12/27/2019 at Unknown time  . promethazine (PHENERGAN) 12.5 MG tablet Take 12.5 mg by mouth every 6 (six) hours as needed for nausea or vomiting.   12/27/2019 at Unknown time  . scopolamine (TRANSDERM-SCOP) 1 MG/3DAYS Place 1 patch (1.5 mg total) onto the skin every 3 (three) days. 10 patch 1 Past Week at Unknown time  . sertraline (ZOLOFT) 25 MG tablet Take 1 tablet (25 mg total) by mouth daily. 30 tablet 1 12/27/2019 at Unknown time    Review of Systems  Constitutional: Negative.  Negative for fever.  HENT: Negative.   Respiratory: Negative.  Negative for cough.   Gastrointestinal: Positive for vomiting. Negative for abdominal pain and diarrhea.  Genitourinary: Negative.   Musculoskeletal: Negative.   Neurological: Negative.  Negative for dizziness and headaches.   Physical Exam   Blood pressure (!) 110/56, pulse 85, temperature 98.3 F (36.8 C), temperature source Oral, resp. rate 20, weight 68.5 kg, last menstrual period 07/05/2019, SpO2 100 %.  Physical Exam Constitutional:      Appearance: Normal appearance.  HENT:     Head: Normocephalic.  Cardiovascular:     Rate and Rhythm: Normal rate.     Pulses: Normal pulses.  Skin:    General: Skin is warm.  Neurological:     General: No focal deficit present.     Mental Status: She is alert.  Psychiatric:        Mood and Affect: Mood normal.     MAU Course  Procedures  MDM -will offer Zofran and scop patch here; patient had IV phenergan, Zofran and scop patch placed; patient  feels better and wants to go home.   -NST: 135 bpm, mod var, present acel, neg decels, no contractions. Patient felt active movement while in MAU.   -Extensive telephone call to [facility] about scop patch; pharmacy did not have Scop patch in stock but pharmacist was able to switch RX to a different [facility] ([place]).   Assessment and Plan   1. Hyperemesis gravidarum    2. Patient stable for discharge;recommend that patient restart 3/week phenergan infusions at infusion center if nausea and vomiting continues.  3. Patient will pick up new RX at [facility]; strict return precautions given.   Mervyn Skeeters Nafisah Runions 12/27/2019, 5:28 PM

## 2020-01-01 ENCOUNTER — Ambulatory Visit (INDEPENDENT_AMBULATORY_CARE_PROVIDER_SITE_OTHER): Payer: Medicaid Other | Admitting: Clinical

## 2020-01-01 DIAGNOSIS — F4323 Adjustment disorder with mixed anxiety and depressed mood: Secondary | ICD-10-CM | POA: Diagnosis not present

## 2020-01-01 NOTE — Patient Instructions (Signed)
[facility] for [facility] [place], [place] [address] [phone] (main office) [phone] ([place] office)     BRAINSTORMING  Develop a Plan Goals: . Provide a way to start conversation about your new life with a baby . Assist parents in recognizing and using resources within their reach . Help pave the way before birth for an easier period of transition afterwards.  Make a list of the following information to keep in a central location: . Full name of Mom and Partner: _____________________________________________ . 80 full name and Date of Birth: ___________________________________________ . Home Address: ___________________________________________________________ ________________________________________________________________________ . Home Phone: ____________________________________________________________ . Parents' cell numbers: _____________________________________________________ ________________________________________________________________________ . Name and contact info for OB: ______________________________________________ . Name and contact info for Pediatrician:________________________________________ . Contact info for Lactation Consultants: ________________________________________  REST and SLEEP *You each need at least 4-5 hours of uninterrupted sleep every day. Write specific names and contact information.* . How are you going to rest in the postpartum period? While partner's home? When partner returns to work? When you both return to work? Marland Kitchen Where will your baby sleep? Marland Kitchen Who is available to help during the day? Evening? Night? . Who could move in for a period to help support you? Marland Kitchen What are some ideas to help you get enough  sleep? __________________________________________________________________________________________________________________________________________________________________________________________________________________________________________ NUTRITIOUS FOOD AND DRINK *Plan for meals before your baby is born so you can have healthy food to eat during the immediate postpartum period.* . Who will look after breakfast? Lunch? Dinner? List names and contact information. Brainstorm quick, healthy ideas for each meal. . What can you do before baby is born to prepare meals for the postpartum period? . How can others help you with meals? Marland Kitchen Which grocery stores provide online shopping and delivery? Marland Kitchen Which restaurants offer take-out or delivery options? ______________________________________________________________________________________________________________________________________________________________________________________________________________________________________________________________________________________________________________________________________________________________________________________________________  CARE FOR MOM *It's important that mom is cared for and pampered in the postpartum period. Remember, the most important ways new mothers need care are: sleep, nutrition, gentle exercise, and time off.* . Who can come take care of mom during this period? Make a list of people with their contact information. . List some activities that make you feel cared for, rested, and energized? Who can make sure you have opportunities to do these things? . Does mom have a space of her very own within your home that's just for her? Make a "[facility]" where she can be comfortable, rest, and renew herself  daily. ______________________________________________________________________________________________________________________________________________________________________________________________________________________________________________________________________________________________________________________________________________________________________________________________________    CARE FOR AND FEEDING BABY *Knowledgeable and encouraging people will offer the best support with regard to feeding your baby.* . Educate yourself and choose the best feeding option for your baby. . Make a list of people who will guide, support, and be a resource for you as your care for and feed your baby. (Friends that have breastfed or are currently breastfeeding, lactation consultants, breastfeeding support groups, etc.) . Consider a postpartum doula. (These websites can give you information: dona.org & BuyingShow.es) . Seek out local breastfeeding resources like the breastfeeding support group at [employer] or [employer]. ______________________________________________________________________________________________________________________________________________________________________________________________________________________________________________________________________________________________________________________________________________________________________________________________________  Verner Chol AND ERRANDS . Who can help with a thorough cleaning before baby is born? . Make a list of people who will help with housekeeping and chores, like laundry, light cleaning, dishes, bathrooms, etc. . Who can run some errands for you? Marland Kitchen What can you do to make sure you are stocked with basic supplies before baby is born? . Who is going to do the  shopping? ______________________________________________________________________________________________________________________________________________________________________________________________________________________________________________________________________________________________________________________________________________________________________________________________________     Family Adjustment *Nurture yourselves.it helps parents be more loving and allows for better bonding with their child.* . What sorts of things do you and partner enjoy doing together? Which activities help you to connect and strengthen your relationship? Make a list of those things. Make a list of people whom you trust to care for your baby so you can have some time together as a couple. . What types of things help partner feel connected to Mom? Make a list. . What needs will partner have in order to bond with baby? . Other children? Who will care for them when you go into labor and while you are in the hospital? . Think about what the needs of your older children might be. Who can help you meet those needs? In what ways are you helping them prepare for bringing baby home? List some specific strategies you have for family adjustment. _______________________________________________________________________________________________________________________________________________________________________________________________________________________________________________________________________________________________________________________________________________  SUPPORT *Someone who can empathize with experiences normalizes your problems and makes them more bearable.* . Make a list of other friends, neighbors, and/or co-workers you know with infants (and small children, if applicable) with whom you can connect. . Make a list of local or online support groups, mom groups, etc. in which you can be  involved. ______________________________________________________________________________________________________________________________________________________________________________________________________________________________________________________________________________________________________________________________________________________________________________________________________  Childcare Plans . Investigate and plan for childcare if mom is returning to work. . Talk about mom's concerns about her transition back to work. . Talk about partner's concerns regarding this transition.  Mental Health *Your mental health is one of the highest priorities for a pregnant or postpartum mom.* . 1 in 5 women experience anxiety and/or depression from the time of conception through the first year after birth. . Postpartum Mood Disorders are the #1 complication of pregnancy and childbirth and the suffering experienced by these mothers is not necessary! These illnesses are temporary and respond well to treatment, which often includes self-care, social support, talk therapy, and medication when needed. . Women experiencing anxiety and depression often say things like: "I'm supposed to be happy.why do I feel so sad?", "Why can't I snap out of it?", "I'm having thoughts that scare me." . There is no need to be embarrassed if you are feeling these symptoms: o Overwhelmed, anxious, angry, sad, guilty, irritable, hopeless, exhausted but can't sleep o You are NOT alone. You are NOT to blame. With help, you WILL be well. . Where can I find help? Medical professionals such as your OB, midwife, gynecologist, family practitioner, primary care provider, pediatrician, or mental health providers; [facility] support groups: Feelings After Birth, Breastfeeding Support Group, Baby and Me Group, and Fit 4 Two exercise classes. . You have permission to ask for help. It will confirm your feelings, validate your  experiences, share/learn coping strategies, and gain support and encouragement as you heal. You are important! BRAINSTORM . Make a list of local resources, including resources for mom and for partner. . Identify support groups. . Identify people to call late at night - include names and contact info. . Talk with partner about perinatal mood and anxiety disorders. . Talk with your OB, midwife, and doula about baby blues and about perinatal mood and anxiety disorders. . Talk with your pediatrician about perinatal mood and anxiety disorders.   Support & Sanity Savers   What do you really need?  . Basics . In preparing for a new baby, many expectant parents spend hours shopping for baby clothes, decorating the nursery, and deciding which car seat to   buy. Yet most don't think much about what the reality of parenting a newborn will be like, and what they need to make it through that. So, here is the advice of experienced parents. We know you'll read this, and think "they're exaggerating, I don't really need that." Just trust [place] on these, OK? Plan for all of this, and if it turns out you don't need it, come back and teach [place] how you did it!  Satira Anis (Once baby's survival needs are met, make sure you attend to your own survival needs!) . Sleep . An average newborn sleeps 16-18 hours per day, over 6-7 sleep periods, rarely more than three hours at a time. It is normal and healthy for a newborn to wake throughout the night... but really hard on parents!! . Naps. Prioritize sleep above any responsibilities like: cleaning house, visiting friends, running errands, etc.  Sleep whenever baby sleeps. If you can't nap, at least have restful times when baby eats. The more rest you get, the more patient you will be, the more emotionally stable, and better at solving problems.  . Food . You may not have realized it would be difficult to eat when you have a newborn. Yet, when we talk to . countless new  parents, they say things like "it may be 2:00 pm when I realize I haven't had breakfast yet." Or "every time we sit down to dinner, baby needs to eat, and my food gets cold, so I don't bother to eat it." . Finger food. Before your baby is born, stock up with one months' worth of food that: 1) you can eat with one hand while holding a baby, 2) doesn't need to be prepped, 3) is good hot or cold, 4) doesn't spoil when left out for a few hours, and 5) you like to eat. Think about: nuts, dried fruit, Clif bars, pretzels, jerky, gogurt, baby carrots, apples, bananas, crackers, cheez-n-crackers, string cheese, hot pockets or frozen burritos to microwave, garden burgers and breakfast pastries to put in the toaster, yogurt drinks, etc. . Restaurant Menus. Make lists of your favorite restaurants & menu items. When family/friends want to help, you can give specific information without much thought. They can either bring you the food or send gift cards for just the right meals. Rosaura Carpenter Meals.  Take some time to make a few meals to put in the freezer ahead of time.  Easy to freeze meals can be anything such as soup, lasagna, chicken pie, or spaghetti sauce. . Set up a Meal Schedule.  Ask friends and family to sign up to bring you meals during the first few weeks of being home. (It can be passed around at baby showers!) You have no idea how helpful this will be until you are in the throes of parenting.  https://hamilton-woodard.com/ is a great website to check out. . Emotional Support . Know who to call when you're stressed out. Parenting a newborn is very challenging work. There are times when it totally overwhelms your normal coping abilities. EVERY NEW PARENT NEEDS TO HAVE A PLAN FOR WHO TO CALL WHEN THEY JUST CAN'T COPE ANY MORE. (And it has to be someone other than the baby's other parent!) Before your baby is born, come up with at least one person you can call for support - write their phone number down and post it on the  refrigerator. Marland Kitchen Anxiety & Sadness. Baby blues are normal after pregnancy; however, there are more severe types of anxiety &  sadness which can occur and should not be ignored.  They are always treatable, but you have to take the first step by reaching out for help. [facility] offers a "Mom Talk" group which meets every Tuesday from 10 am - 11 am.  This group is for new moms who need support and connection after their babies are born.  Call [phone].  Marland Kitchen Really, Really Helpful (Plan for them! Make sure these happen often!!) . Physical Support with Taking Care of Yourselves . Asking friends and family. Before your baby is born, set up a schedule of people who can come and visit and help out (or ask a friend to schedule for you). Any time someone says "let me know what I can do to help," sign them up for a day. When they get there, their job is not to take care of the baby (that's your job and your joy). Their job is to take care of you!  . Postpartum doulas. If you don't have anyone you can call on for support, look into postpartum doulas:  professionals at helping parents with caring for baby, caring for themselves, getting breastfeeding started, and helping with household tasks. www.padanc.org is a helpful website for learning about doulas in our area. . Peer Support / Parent Groups . Why: One of the greatest ideas for new parents is to be around other new parents. Parent groups give you a chance to share and listen to others who are going through the same season of life, get a sense of what is normal infant development by watching several babies learn and grow, share your stories of triumph and struggles with empathetic ears, and forgive your own mistakes when you realize all parents are learning by trial and error. . Where to find: There are many places you can meet other new parents throughout our community.  [facility] offers the following classes for new moms and their little ones:  Baby  and Me (Birth to [place]) and Breastfeeding Support Group. Go to www.conehealthybaby.com or call [phone] for more information. . Time for your Relationship . It's easy to get so caught up in meeting baby's immediate needs that it's hard to find time to connect with your partner, and meet the needs of your relationship. It's also easy to forget what "quality time with your partner" actually looks like. If you take your baby on a date, you'd be amazed how much of your couple time is spent feeding the baby, diapering the baby, admiring the baby, and talking about the baby. . Dating: Try to take time for just the two of you. Babysitter tip: Sometimes when moms are breastfeeding a newborn, they find it hard to figure out how to schedule outings around baby's unpredictable feeding schedules. Have the babysitter come for a three hour period. When she comes over, if baby has just eaten, you can leave right away, and come back in two hours. If baby hasn't fed recently, you start the date at home. Once baby gets hungry and gets a good feeding in, you can head out for the rest of your date time. . Date Nights at Home: If you can't get out, at least set aside one evening a week to prioritize your relationship: whenever baby dozes off or doesn't have any immediate needs, spend a little time focusing on each other. . Potential conflicts: The main relationship conflicts that come up for new parents are: issues related to sexuality, financial stresses, a feeling of an unfair division  of household tasks, and conflicts in parenting styles. The more you can work on these issues before baby arrives, the better!  . Fun and Frills (Don't forget these. and don't feel guilty for indulging in them!) . Everyone has something in life that is a fun little treat that they do just for themselves. It may be: reading the morning paper, or going for a daily jog, or having coffee with a friend once a week, or going to a movie on Friday  nights, or fine chocolates, or bubble baths, or curling up with a good book. . Unless you do fun things for yourself every now and then, it's hard to have the energy for fun with your baby. Whatever your "special" treats are, make sure you find a way to continue to indulge in them after your baby is born. These special moments can recharge you, and allow you to return to baby with a new joy   PERINATAL MOOD DISORDERS: MATERNAL MENTAL HEALTH FROM CONCEPTION THROUGH THE POSTPARTUM PERIOD   Emergency and Crisis Resources:  If you are an imminent risk to self or others, are experiencing intense personal distress, and/or have noticed significant changes in activities of daily living, call:  . 911 . [facility]: [phone] . Mobile Crisis: 877-626-1772 . National Suicide Hotline: 1-800-273-8255 Or visit the following crisis centers: . Local Emergency Departments . [facility]: [address], [place] [phone]. Hours: 8:30AM-5PM. Insurance Accepted: Medicaid, Medicare, and Uninsured.  . [facility]  [address], [place] Mon-Friday 8am-3pm  [phone]                                                                                    Non-Crisis Resources: To identify specific providers that are covered by your insurance, contact your insurance company or local agencies: [facility]: 1-800-256-2452 [facility]: 888-581-9988 [employer]: 1-800-939-5911 Postpartum Support International- [place] 1-800-944-4773                                                      Outpatient therapy and medication management providers:  [facility] [phone] Hours: 9AM-5PM  Insurance Accepted: AARP, Aetna, BCBS, Cigna, [employer], Humana, Medicare  [facility] [phone] Hours: 8AM-5PM  nsurance Accepted: All insurances EXCEPT AARP, Aetna, [employer], and  [employer] [facility]: [phone]             Hours: 8AM-8PM Insurance Accepted: Aetna, BCBS, Cigna, [employer], Medicaid, Medicare, Uninsured [facility]: [phone] [facility]: [phone] Hours: 8:30AM-7PM Insurance Accepted: Aetna, BCBS, Medicaid, Medicare, Tricare, [facility]:  [phone]              Hours:9AM-5PM Insurance Accepted:  Aetna, BCBS, [facility], Medicaid, United Health Care  [facility] [phone] Hours: 9AM-5:30PM Insurance Accepted: AARP, Aetna, BCBS, Cigna, and Medicaid, Medicare, United Health Care [facility]:  [phone] Hours: 9am-5pm Insurance Accepted: BCBS; they do not accept Medicaid/Medicare   [facility]: [phone] Hours: 9am-9pm Insurance Accepted: All major insurance including Medicaid and Medicare [facility]: [phone] Hours: 9AM-5:30PM Insurance Accepted: All insurances EXCEPT Medicaid and Medicare. [facility]: [phone]                                                                       Parenting Support Groups [facility]: [phone] [facility]:  [phone] Family Support Network (support for children in the NICU and/or with special needs), [phone]                                                                   Mental Health Support Groups Mental Health Association: 336-373-1402                                                                                     Online Resources: Postpartum Support International: http://www.postpartum.net/  800-944-4PPD 2Moms Supporting Moms:  www.momssupportingmoms.net     

## 2020-01-09 ENCOUNTER — Encounter: Payer: Self-pay | Admitting: Advanced Practice Midwife

## 2020-01-09 ENCOUNTER — Other Ambulatory Visit: Payer: Self-pay

## 2020-01-09 ENCOUNTER — Ambulatory Visit (INDEPENDENT_AMBULATORY_CARE_PROVIDER_SITE_OTHER): Payer: Medicaid Other | Admitting: Advanced Practice Midwife

## 2020-01-09 DIAGNOSIS — O210 Mild hyperemesis gravidarum: Secondary | ICD-10-CM

## 2020-01-09 NOTE — Patient Instructions (Signed)

## 2020-01-09 NOTE — Progress Notes (Signed)
Patient states that the scolpamine patch helps a lot with her nausea. Kathrene Alu RN

## 2020-01-09 NOTE — Progress Notes (Signed)
   PRENATAL VISIT NOTE  Subjective:  Vicki Harper is a 24 y.o. G1P0 at [redacted]w[redacted]d being seen today for ongoing prenatal care.  She is currently monitored for the following issues for this low-risk pregnancy and has Chronic pain of left knee; Supervision of normal first pregnancy, antepartum; Hyperemesis gravidarum; LGSIL on Pap smear of cervix; Asthma affecting pregnancy in first trimester; and Depression affecting pregnancy, antepartum on their problem list.  Patient reports no complaints.  Contractions: Not present. Vag. Bleeding: None.  Movement: Present. Denies leaking of fluid.   The following portions of the patient's history were reviewed and updated as appropriate: allergies, current medications, past family history, past medical history, past social history, past surgical history and problem list.   Objective:   Vitals:   01/09/20 1055  BP: (!) 111/59  Pulse: 68  Weight: 160 lb (72.6 kg)    Fetal Status:   Fundal Height: 26 cm Movement: Present     General:  Alert, oriented and cooperative. Patient is in no acute distress.  Skin: Skin is warm and dry. No rash noted.   Cardiovascular: Normal heart rate noted  Respiratory: Normal respiratory effort, no problems with respiration noted  Abdomen: Soft, gravid, appropriate for gestational age.  Pain/Pressure: Absent     Pelvic: Cervical exam deferred        Extremities: Normal range of motion.  Edema: Trace  Mental Status: Normal mood and affect. Normal behavior. Normal judgment and thought content.   Assessment and Plan:  Pregnancy: G1P0 at [redacted]w[redacted]d 1. Hyperemesis gravidarum Was able to get scop patches Doing much better   Gained 10 lbs  Preterm labor symptoms and general obstetric precautions including but not limited to vaginal bleeding, contractions, leaking of fluid and fetal movement were reviewed in detail with the patient. Please refer to After Visit Summary for other counseling recommendations.     Future Appointments   Date Time Provider [place]  01/29/2020  8:15 AM [place]  01/30/2020 10:10 AM Seabron Spates, CNM [facility] None    Hansel Feinstein, CNM

## 2020-01-23 ENCOUNTER — Other Ambulatory Visit: Payer: Self-pay | Admitting: Advanced Practice Midwife

## 2020-01-23 DIAGNOSIS — O210 Mild hyperemesis gravidarum: Secondary | ICD-10-CM

## 2020-01-23 MED ORDER — PRENATAL VITAMIN 27-0.8 MG PO TABS: 1.0000 | ORAL_TABLET | Freq: Every day | ORAL | 12 refills | Status: DC

## 2020-01-23 MED ORDER — SCOPOLAMINE 1 MG/3DAYS TD PT72: 1.0000 | MEDICATED_PATCH | TRANSDERMAL | 1 refills | Status: DC

## 2020-01-23 MED ORDER — ONDANSETRON 4 MG PO TBDP: 8.0000 mg | ORAL_TABLET | Freq: Three times a day (TID) | ORAL | 2 refills | Status: DC | PRN

## 2020-01-23 NOTE — Progress Notes (Signed)
Pt requested Rx PNV, Scop patch, and zofran

## 2020-01-25 NOTE — BH Specialist Note (Signed)
error 

## 2020-01-29 ENCOUNTER — Encounter (HOSPITAL_COMMUNITY): Payer: Self-pay | Admitting: Obstetrics and Gynecology

## 2020-01-29 ENCOUNTER — Inpatient Hospital Stay (HOSPITAL_COMMUNITY)
Admission: AD | Admit: 2020-01-29 | Discharge: 2020-01-29 | Disposition: A | Discharge status: Home / Self Care | Payer: Medicaid Other | Attending: Obstetrics and Gynecology | Admitting: Obstetrics and Gynecology

## 2020-01-29 ENCOUNTER — Other Ambulatory Visit: Payer: Self-pay

## 2020-01-29 ENCOUNTER — Ambulatory Visit (INDEPENDENT_AMBULATORY_CARE_PROVIDER_SITE_OTHER): Payer: Medicaid Other | Admitting: Clinical

## 2020-01-29 DIAGNOSIS — O99513 Diseases of the respiratory system complicating pregnancy, third trimester: Secondary | ICD-10-CM | POA: Diagnosis not present

## 2020-01-29 DIAGNOSIS — Z3A29 29 weeks gestation of pregnancy: Secondary | ICD-10-CM | POA: Insufficient documentation

## 2020-01-29 DIAGNOSIS — R55 Syncope and collapse: Secondary | ICD-10-CM | POA: Insufficient documentation

## 2020-01-29 DIAGNOSIS — O99891 Other specified diseases and conditions complicating pregnancy: Secondary | ICD-10-CM

## 2020-01-29 DIAGNOSIS — O26893 Other specified pregnancy related conditions, third trimester: Secondary | ICD-10-CM | POA: Insufficient documentation

## 2020-01-29 DIAGNOSIS — K649 Unspecified hemorrhoids: Secondary | ICD-10-CM | POA: Diagnosis not present

## 2020-01-29 DIAGNOSIS — J45909 Unspecified asthma, uncomplicated: Secondary | ICD-10-CM | POA: Diagnosis not present

## 2020-01-29 DIAGNOSIS — O2243 Hemorrhoids in pregnancy, third trimester: Secondary | ICD-10-CM | POA: Insufficient documentation

## 2020-01-29 DIAGNOSIS — O99613 Diseases of the digestive system complicating pregnancy, third trimester: Secondary | ICD-10-CM | POA: Diagnosis not present

## 2020-01-29 DIAGNOSIS — Z79899 Other long term (current) drug therapy: Secondary | ICD-10-CM | POA: Insufficient documentation

## 2020-01-29 DIAGNOSIS — O2240 Hemorrhoids in pregnancy, unspecified trimester: Secondary | ICD-10-CM

## 2020-01-29 DIAGNOSIS — F4323 Adjustment disorder with mixed anxiety and depressed mood: Secondary | ICD-10-CM

## 2020-01-29 DIAGNOSIS — K117 Disturbances of salivary secretion: Secondary | ICD-10-CM

## 2020-01-29 LAB — URINALYSIS, ROUTINE W REFLEX MICROSCOPIC
Bilirubin Urine: NEGATIVE
Glucose, UA: 50 mg/dL — AB
Hgb urine dipstick: NEGATIVE
Ketones, ur: 5 mg/dL — AB
Leukocytes,Ua: NEGATIVE
Nitrite: NEGATIVE
Protein, ur: 100 mg/dL — AB
Specific Gravity, Urine: 1.026 (ref 1.005–1.030)
pH: 6 (ref 5.0–8.0)

## 2020-01-29 NOTE — BH Specialist Note (Signed)
Integrated Behavioral Health via Telemedicine Video (Caregility) Visit  01/25/2020 Walda Hertzog [id number]  Number of [place] visits: 3 Session Start time: 8:17  Session End time: 8:45 Total time: 28 minutes  Referring Provider: Hansel Feinstein, CNM Type of Visit: Video Patient/Family location: Home [facility] Provider location: Center for [place] [facility]  All persons participating in visit: Patient Klee Kolek and [place]    Discussed confidentiality: Yes   I connected with [name] by a video enabled telemedicine application (Caregility) and verified that I am speaking with the correct person using two identifiers.    I discussed that engaging in this virtual visit, they consent to the provision of behavioral healthcare and the services will be billed under their insurance.   Patient and/or legal guardian expressed understanding and consented to virtual visit: Yes   PRESENTING CONCERNS: Patient and/or family reports the following symptoms/concerns: Pt states her primary concern is difficulty staying asleep and uncertainty about job postpartum; pt requests [facility] referral; will feel better knowing she has taken Infant CPR and has lactation support before baby is born.  Duration of problem: Current pregnancy; Severity of problem: mild  STRENGTHS (Protective Factors/Coping Skills): Strong social support  GOALS ADDRESSED: Patient will: 1.  Reduce symptoms of: anxiety and depression  2.  Increase knowledge and/or ability of: stress reduction  3.  Demonstrate ability to: Increase healthy adjustment to current life circumstances  INTERVENTIONS: Interventions utilized:  Supportive Counseling and Link to [employer] Standardized Assessments completed: Not given today  ASSESSMENT: Patient currently experiencing Adjustment disorder with mixed anxiety and depressed mood   Patient may benefit from continued  psychoeducation and brief therapeutic interventions regarding coping with symptoms of anxiety and depression .  PLAN: 1. Follow up with behavioral health clinician on : Six weeks; Call Jalene Lacko at [phone] if needed prior to scheduled follow up visit. 2. Behavioral recommendations:  -Continue taking prenatal vitamin as recommended by medical provider -Continue with plan to read through Postpartum Planner (on After Visit Summary today) -Accept referral to [facility] today -Continue plan to register for and attend [employer] Infant CPR class during pregnancy -After baby is born, know that Lactation support will be available in the hospital prior to discharge, as well as Lactation Consult appointments available postpartum. (Breastfeeding support group also available postpartum on [email or web address]) -Continue to consider brief 10-20 minute naps, as needed throughout the day, after poor sleep nights  3. Referral(s): [place] (In Clinic) and Community Resources:  [facility]  I discussed the assessment and treatment plan with the patient and/or parent/guardian. They were provided an opportunity to ask questions and all were answered. They agreed with the plan and demonstrated an understanding of the instructions.   They were advised to call back or seek an in-person evaluation if the symptoms worsen or if the condition fails to improve as anticipated.   Confirmed patient's address: Yes  Confirmed patient's phone number: Yes  Any changes to demographics: No   Confirmed patient's insurance: Yes  Any changes to patient's insurance: No   I discussed the limitations of evaluation and management by telemedicine and the availability of in person appointments.  I discussed that the purpose of this visit is to provide behavioral health care while limiting exposure to the novel coronavirus.   Discussed there is a possibility of technology failure and discussed alternative modes of  communication if that failure occurs.  [name] [facility]  Depression screen [facility] 2/9 01/01/2020  12/18/2019  Decreased Interest 1 1  Down, Depressed, Hopeless 1 2  PHQ - 2 Score 2 3  Altered sleeping 0 0  Tired, decreased energy 1 2  Change in appetite 0 0  Feeling bad or failure about yourself  1 2  Trouble concentrating 0 0  Moving slowly or fidgety/restless 0 0  Suicidal thoughts 0 1  PHQ-9 Score 4 8   GAD 7 : Generalized Anxiety Score 01/01/2020 12/18/2019  Nervous, Anxious, on Edge 0 1  Control/stop worrying 1 2  Worry too much - different things 1 2  Trouble relaxing 0 1  Restless 0 0  Easily annoyed or irritable 2 3  Afraid - awful might happen 1 0  Total GAD 7 Score 5 9

## 2020-01-29 NOTE — Discharge Instructions (Signed)
Drink at least 8 8-oz glasses of water every day. Limit screen time 2 hours before bed and during the night. Try relaxation techniques if you are not sleeping. Sign up for childbirth and breastfeeding classes at [email or web address]

## 2020-01-29 NOTE — MAU Note (Signed)
Pt reports being on her lunch break from work. Pt reports feeling pounding and pressure in her head. Pt felt nauseous, lightheaded, and dizzy. Pt reports feeling like she was going to pass out, but did not.   Pt reports feeling SOB since the incident.   Denies vaginal bleeding or LOF.

## 2020-01-29 NOTE — MAU Provider Note (Signed)
History     CSN: [id number]  Arrival date and time: 01/29/20 1358 Seen by provider at 1455    Chief Complaint  Patient presents with  . Headache  . Dizziness   HPI Vicki Harper 24 y.o. [redacted]w[redacted]d Comes to MAU via EMS as she had a near syncopal episode at work.  Did not pass out.  Became dizzy and sat down but was still feeling bad so work called EMS to bring her to the hospital.  Has a history of hyperemesis and for awhile did get regular IV infusions 3 times a week for Phenergan.  Has not vomited recently.  Headache has improved today.  Client does not think she needs fluids.  Diet recall today - ate breakfast and then lunch also.  Became weak and dizzy after eating lunch and having a BM.  Reports she is not sleeping well at night.  Is trying to drink appropriate fluids.  Does not have much of an appetite and is not eating much food.  Has had dizziness and near syncope before she became pregnant, but has never passed out.  Has medicine for ptyalism but it does not help so she does not take it.  OB History    Gravida  1   Para      Term      Preterm      AB      Living        SAB      TAB      Ectopic      Multiple      Live Births              Past Medical History:  Diagnosis Date  . Asthma   . Back pain   . Headache     Past Surgical History:  Procedure Laterality Date  . NO PAST SURGERIES      Family History  Problem Relation Age of Onset  . Migraines Mother     Social History   Tobacco Use  . Smoking status: Never Smoker  . Smokeless tobacco: Never Used  Vaping Use  . Vaping Use: Never used  Substance Use Topics  . Alcohol use: No    Alcohol/week: 0.0 standard drinks  . Drug use: No    Allergies: No Known Allergies  Medications Prior to Admission  Medication Sig Dispense Refill Last Dose  . albuterol (PROVENTIL HFA;VENTOLIN HFA) 108 (90 BASE) MCG/ACT inhaler Inhale 1-2 puffs into the lungs every 6 (six) hours as needed for  wheezing or shortness of breath. 1 Inhaler 0 01/29/2020 at Unknown time  . Elastic Bandages & Supports (COMFORT FIT MATERNITY SUPP MED) MISC Wear belt during the day, as needed, and remove at night prior to bedtime. 1 each 0 Past Month at Unknown time  . ondansetron (ZOFRAN-ODT) 4 MG disintegrating tablet Take 2 tablets (8 mg total) by mouth every 8 (eight) hours as needed. 30 tablet 2 01/29/2020 at Unknown time  . Prenatal Vit-Fe Fumarate-FA (PRENATAL VITAMIN) 27-0.8 MG TABS Take 1 tablet by mouth daily. 30 tablet 12 01/29/2020 at Unknown time  . promethazine (PHENERGAN) 12.5 MG tablet Take 12.5 mg by mouth every 6 (six) hours as needed for nausea or vomiting.   Past Month at Unknown time  . scopolamine (TRANSDERM-SCOP) 1 MG/3DAYS Place 1 patch (1.5 mg total) onto the skin every 3 (three) days. 10 patch 1 01/29/2020 at Unknown time  . sertraline (ZOLOFT) 25 MG tablet Take 1 tablet (25 mg total)  by mouth daily. 30 tablet 1 01/29/2020 at Unknown time  . famotidine (PEPCID) 20 MG tablet Take 1 tablet (20 mg total) by mouth 2 (two) times daily. 30 tablet 0   . Prenatal Vit-Fe Fumarate-FA (PRENATAL MULTIVITAMIN) TABS tablet Take 1 tablet by mouth daily at 12 noon.       Review of Systems  Constitutional: Negative for fever.  Respiratory: Positive for shortness of breath. Negative for cough and wheezing.   Gastrointestinal: Negative for abdominal pain, nausea and vomiting.       Spitting noted  Genitourinary: Negative for dysuria, vaginal bleeding and vaginal discharge.   Physical Exam   Blood pressure 103/60, pulse 98, temperature 98.1 F (36.7 C), temperature source Oral, resp. rate 12, last menstrual period 07/05/2019, SpO2 100 %.  Physical Exam Vitals and nursing note reviewed.  Constitutional:      Appearance: She is well-developed.  HENT:     Head: Normocephalic.  Abdominal:     Palpations: Abdomen is soft.     Comments: FHT 135 with moderate variability and 10x10 accels noted.  No  contractions and no decelerations. Strip is appropriate and reassuring for gestational age.  Musculoskeletal:        General: Normal range of motion.     Cervical back: Neck supple.  Skin:    General: Skin is warm and dry.  Neurological:     Mental Status: She is alert and oriented to person, place, and time.     MAU Course  Procedures LABS Results for orders placed or performed during the hospital encounter of 01/29/20 (from the past 24 hour(s))  Urinalysis, Routine w reflex microscopic Urine, Clean Catch     Status: Abnormal   Collection Time: 01/29/20  2:25 PM  Result Value Ref Range   Color, Urine AMBER (A) YELLOW   APPearance HAZY (A) CLEAR   Specific Gravity, Urine 1.026 1.005 - 1.030   pH 6.0 5.0 - 8.0   Glucose, UA 50 (A) NEGATIVE mg/dL   Hgb urine dipstick NEGATIVE NEGATIVE   Bilirubin Urine NEGATIVE NEGATIVE   Ketones, ur 5 (A) NEGATIVE mg/dL   Protein, ur 100 (A) NEGATIVE mg/dL   Nitrite NEGATIVE NEGATIVE   Leukocytes,Ua NEGATIVE NEGATIVE   RBC / HPF 0-5 0 - 5 RBC/hpf   WBC, UA 0-5 0 - 5 WBC/hpf   Bacteria, UA FEW (A) NONE SEEN   Squamous Epithelial / LPF 0-5 0 - 5   Mucus PRESENT     MDM Will give a note to be out of work until Wednesday.  Has glucose testing tomorrow Reviewed techniques to use for sleep when awakening at night. Reports she has not slept well. Is having hemorrhoids and infrequent bowel movements.   Assessment and Plan  Near syncope Hemorrhoids Ptyalism  Plan Drink at least 8 8-oz glasses of water every day. Limit screen time 2 hours before bed and during the night. Try relaxation techniques if you are not sleeping. Sign up for childbirth and breastfeeding classes at [email or web address] Has glucola test tomorrow and note given to return to work on Wednesday.  Deavion Dobbs L Darrel Baroni 01/29/2020, 2:48 PM

## 2020-01-29 NOTE — Patient Instructions (Signed)
[facility] [address] [place], [place] [address] [phone] (main office) [phone] (Jamie's office)     BRAINSTORMING  Develop a Plan Goals: . Provide a way to start conversation about your new life with a baby . Assist parents in recognizing and using resources within their reach . Help pave the way before birth for an easier period of transition afterwards.  Make a list of the following information to keep in a central location: . Full name of Mom and Partner: _____________________________________________ . Baby's full name and Date of Birth: ___________________________________________ . Home Address: ___________________________________________________________ ________________________________________________________________________ . Home Phone: ____________________________________________________________ . Parents' cell numbers: _____________________________________________________ ________________________________________________________________________ . Name and contact info for OB: ______________________________________________ . Name and contact info for Pediatrician:________________________________________ . Contact info for Lactation Consultants: ________________________________________  REST and SLEEP *You each need at least 4-5 hours of uninterrupted sleep every day. Write specific names and contact information.* . How are you going to rest in the postpartum period? While partner's home? When partner returns to work? When you both return to work? . Where will your baby sleep? . Who is available to help during the day? Evening? Night? . Who could move in for a period to help support you? . What are some ideas to help you get enough  sleep? __________________________________________________________________________________________________________________________________________________________________________________________________________________________________________ NUTRITIOUS FOOD AND DRINK *Plan for meals before your baby is born so you can have healthy food to eat during the immediate postpartum period.* . Who will look after breakfast? Lunch? Dinner? List names and contact information. Brainstorm quick, healthy ideas for each meal. . What can you do before baby is born to prepare meals for the postpartum period? . How can others help you with meals? . Which grocery stores provide online shopping and delivery? . Which restaurants offer take-out or delivery options? ______________________________________________________________________________________________________________________________________________________________________________________________________________________________________________________________________________________________________________________________________________________________________________________________________  CARE FOR MOM *It's important that mom is cared for and pampered in the postpartum period. Remember, the most important ways new mothers need care are: sleep, nutrition, gentle exercise, and time off.* . Who can come take care of mom during this period? Make a list of people with their contact information. . List some activities that make you feel cared for, rested, and energized? Who can make sure you have opportunities to do these things? . Does mom have a space of her very own within your home that's just for her? Make a "Mama Cave" where she can be comfortable, rest, and renew herself  daily. ______________________________________________________________________________________________________________________________________________________________________________________________________________________________________________________________________________________________________________________________________________________________________________________________________    CARE FOR AND FEEDING BABY *Knowledgeable and encouraging people will offer the best support with regard to feeding your baby.* . Educate yourself and choose the best feeding option for your baby. . Make a list of people who will guide, support, and be a resource for you as your care for and feed your baby. (Friends that have breastfed or are currently breastfeeding, lactation consultants, breastfeeding support groups, etc.) . Consider a postpartum doula. (These websites can give you information: dona.org & padanc.org) . Seek out local breastfeeding resources like the breastfeeding support group at [facility] or [employer]. ______________________________________________________________________________________________________________________________________________________________________________________________________________________________________________________________________________________________________________________________________________________________________________________________________  CHORES AND ERRANDS . Who can help with a thorough cleaning before baby is born? . Make a list of people who will help with housekeeping and chores, like laundry, light cleaning, dishes, bathrooms, etc. . Who can run some errands for you? . What can you do to make sure you are stocked with basic supplies before baby is born? . Who is going to do the    shopping? ______________________________________________________________________________________________________________________________________________________________________________________________________________________________________________________________________________________________________________________________________________________________________________________________________     Family Adjustment *Nurture yourselves.it helps parents be more loving and allows for better bonding with their child.* . What sorts of things do you and partner enjoy doing together? Which activities help you to connect and strengthen your relationship? Make a list of those things. Make a list of people whom you trust to care for your baby so you can have some time together as a couple. . What types of things help partner feel connected to Mom? Make a list. . What needs will partner have in order to bond with baby? . Other children? Who will care for them when you go into labor and while you are in the hospital? . Think about what the needs of your older children might be. Who can help you meet those needs? In what ways are you helping them prepare for bringing baby home? List some specific strategies you have for family adjustment. _______________________________________________________________________________________________________________________________________________________________________________________________________________________________________________________________________________________________________________________________________________  SUPPORT *Someone who can empathize with experiences normalizes your problems and makes them more bearable.* . Make a list of other friends, neighbors, and/or co-workers you know with infants (and small children, if applicable) with whom you can connect. . Make a list of local or online support groups, mom groups, etc. in which you can be  involved. ______________________________________________________________________________________________________________________________________________________________________________________________________________________________________________________________________________________________________________________________________________________________________________________________________  Childcare Plans . Investigate and plan for childcare if mom is returning to work. . Talk about mom's concerns about her transition back to work. . Talk about partner's concerns regarding this transition.  Mental Health *Your mental health is one of the highest priorities for a pregnant or postpartum mom.* . 1 in 5 women experience anxiety and/or depression from the time of conception through the first year after birth. . Postpartum Mood Disorders are the #1 complication of pregnancy and childbirth and the suffering experienced by these mothers is not necessary! These illnesses are temporary and respond well to treatment, which often includes self-care, social support, talk therapy, and medication when needed. . Women experiencing anxiety and depression often say things like: "I'm supposed to be happy.why do I feel so sad?", "Why can't I snap out of it?", "I'm having thoughts that scare me." . There is no need to be embarrassed if you are feeling these symptoms: o Overwhelmed, anxious, angry, sad, guilty, irritable, hopeless, exhausted but can't sleep o You are NOT alone. You are NOT to blame. With help, you WILL be well. . Where can I find help? Medical professionals such as your OB, midwife, gynecologist, family practitioner, primary care provider, pediatrician, or mental health providers; [facility] support groups: Feelings After Birth, Breastfeeding Support Group, Baby and Me Group, and Fit 4 Two exercise classes. . You have permission to ask for help. It will confirm your feelings, validate your  experiences, share/learn coping strategies, and gain support and encouragement as you heal. You are important! BRAINSTORM . Make a list of local resources, including resources for mom and for partner. . Identify support groups. . Identify people to call late at night - include names and contact info. . Talk with partner about perinatal mood and anxiety disorders. . Talk with your OB, midwife, and doula about baby blues and about perinatal mood and anxiety disorders. . Talk with your pediatrician about perinatal mood and anxiety disorders.   Support & Sanity Savers   What do you really need?  . Basics . In preparing for a new baby, many expectant parents spend hours shopping for baby clothes, decorating the nursery, and deciding which car seat to   buy. Yet most don't think much about what the reality of parenting a newborn will be like, and what they need to make it through that. So, here is the advice of experienced parents. We know you'll read this, and think "they're exaggerating, I don't really need that." Just trust us on these, OK? Plan for all of this, and if it turns out you don't need it, come back and teach us how you did it!  . Must-Haves (Once baby's survival needs are met, make sure you attend to your own survival needs!) . Sleep . An average newborn sleeps 16-18 hours per day, over 6-7 sleep periods, rarely more than three hours at a time. It is normal and healthy for a newborn to wake throughout the night... but really hard on parents!! . Naps. Prioritize sleep above any responsibilities like: cleaning house, visiting friends, running errands, etc.  Sleep whenever baby sleeps. If you can't nap, at least have restful times when baby eats. The more rest you get, the more patient you will be, the more emotionally stable, and better at solving problems.  . Food . You may not have realized it would be difficult to eat when you have a newborn. Yet, when we talk to . countless new  parents, they say things like "it may be 2:00 pm when I realize I haven't had breakfast yet." Or "every time we sit down to dinner, baby needs to eat, and my food gets cold, so I don't bother to eat it." . Finger food. Before your baby is born, stock up with one months' worth of food that: 1) you can eat with one hand while holding a baby, 2) doesn't need to be prepped, 3) is good hot or cold, 4) doesn't spoil when left out for a few hours, and 5) you like to eat. Think about: nuts, dried fruit, Clif bars, pretzels, jerky, gogurt, baby carrots, apples, bananas, crackers, cheez-n-crackers, string cheese, hot pockets or frozen burritos to microwave, garden burgers and breakfast pastries to put in the toaster, yogurt drinks, etc. . Restaurant Menus. Make lists of your favorite restaurants & menu items. When family/friends want to help, you can give specific information without much thought. They can either bring you the food or send gift cards for just the right meals. . Freezer Meals.  Take some time to make a few meals to put in the freezer ahead of time.  Easy to freeze meals can be anything such as soup, lasagna, chicken pie, or spaghetti sauce. . Set up a Meal Schedule.  Ask friends and family to sign up to bring you meals during the first few weeks of being home. (It can be passed around at baby showers!) You have no idea how helpful this will be until you are in the throes of parenting.  www.takethemameal.com is a great website to check out. . Emotional Support . Know who to call when you're stressed out. Parenting a newborn is very challenging work. There are times when it totally overwhelms your normal coping abilities. EVERY NEW PARENT NEEDS TO HAVE A PLAN FOR WHO TO CALL WHEN THEY JUST CAN'T COPE ANY MORE. (And it has to be someone other than the baby's other parent!) Before your baby is born, come up with at least one person you can call for support - write their phone number down and post it on the  refrigerator. . Anxiety & Sadness. Baby blues are normal after pregnancy; however, there are more severe types of anxiety &   sadness which can occur and should not be ignored.  They are always treatable, but you have to take the first step by reaching out for help. [facility] offers a "Mom Talk" group which meets every Tuesday from 10 am - 11 am.  This group is for new moms who need support and connection after their babies are born.  Call [phone].  . Really, Really Helpful (Plan for them! Make sure these happen often!!) . Physical Support with Taking Care of Yourselves . Asking friends and family. Before your baby is born, set up a schedule of people who can come and visit and help out (or ask a friend to schedule for you). Any time someone says "let me know what I can do to help," sign them up for a day. When they get there, their job is not to take care of the baby (that's your job and your joy). Their job is to take care of you!  . Postpartum doulas. If you don't have anyone you can call on for support, look into postpartum doulas:  professionals at helping parents with caring for baby, caring for themselves, getting breastfeeding started, and helping with household tasks. www.padanc.org is a helpful website for learning about doulas in our area. . Peer Support / Parent Groups . Why: One of the greatest ideas for new parents is to be around other new parents. Parent groups give you a chance to share and listen to others who are going through the same season of life, get a sense of what is normal infant development by watching several babies learn and grow, share your stories of triumph and struggles with empathetic ears, and forgive your own mistakes when you realize all parents are learning by trial and error. . Where to find: There are many places you can meet other new parents throughout our community.  [facility] offers the following classes for new moms and their little ones:  Baby  and Me (Birth to Crawling) and Breastfeeding Support Group. Go to www.conehealthybaby.com or call [phone] for more information. . Time for your Relationship . It's easy to get so caught up in meeting baby's immediate needs that it's hard to find time to connect with your partner, and meet the needs of your relationship. It's also easy to forget what "quality time with your partner" actually looks like. If you take your baby on a date, you'd be amazed how much of your couple time is spent feeding the baby, diapering the baby, admiring the baby, and talking about the baby. . Dating: Try to take time for just the two of you. Babysitter tip: Sometimes when moms are breastfeeding a newborn, they find it hard to figure out how to schedule outings around baby's unpredictable feeding schedules. Have the babysitter come for a three hour period. When she comes over, if baby has just eaten, you can leave right away, and come back in two hours. If baby hasn't fed recently, you start the date at home. Once baby gets hungry and gets a good feeding in, you can head out for the rest of your date time. . Date Nights at Home: If you can't get out, at least set aside one evening a week to prioritize your relationship: whenever baby dozes off or doesn't have any immediate needs, spend a little time focusing on each other. . Potential conflicts: The main relationship conflicts that come up for new parents are: issues related to sexuality, financial stresses, a feeling of an unfair division   of household tasks, and conflicts in parenting styles. The more you can work on these issues before baby arrives, the better!  . Fun and Frills (Don't forget these. and don't feel guilty for indulging in them!) . Everyone has something in life that is a fun little treat that they do just for themselves. It may be: reading the morning paper, or going for a daily jog, or having coffee with a friend once a week, or going to a movie on Friday  nights, or fine chocolates, or bubble baths, or curling up with a good book. . Unless you do fun things for yourself every now and then, it's hard to have the energy for fun with your baby. Whatever your "special" treats are, make sure you find a way to continue to indulge in them after your baby is born. These special moments can recharge you, and allow you to return to baby with a new joy   PERINATAL MOOD DISORDERS: MATERNAL MENTAL HEALTH FROM CONCEPTION THROUGH THE POSTPARTUM PERIOD   Emergency and Crisis Resources:  If you are an imminent risk to self or others, are experiencing intense personal distress, and/or have noticed significant changes in activities of daily living, call:  . 911 . [facility]: [phone] . Mobile Crisis: 877-626-1772 . National Suicide Hotline: 1-800-273-8255 Or visit the following crisis centers: . Local Emergency Departments . [facility]: [address], [place] [phone]. Hours: 8:30AM-5PM. Insurance Accepted: Medicaid, Medicare, and Uninsured.  . [facility]  [address], [place] Mon-Friday 8am-3pm  [phone]                                                                                    Non-Crisis Resources: To identify specific providers that are covered by your insurance, contact your insurance company or local agencies: [facility]: 1-800-256-2452 [facility]: 888-581-9988 [employer]: 1-800-939-5911 Postpartum Support International- [place] 1-800-944-4773                                                      Outpatient therapy and medication management providers:  [facility] [phone] Hours: 9AM-5PM  Insurance Accepted: AARP, Aetna, BCBS, Cigna, [employer], Humana, Medicare  [facility] [phone] Hours: 8AM-5PM  nsurance Accepted: All insurances EXCEPT AARP, Aetna, [employer], and  [employer] [facility]: [phone]             Hours: 8AM-8PM Insurance Accepted: Aetna, BCBS, Cigna, [employer], Medicaid, Medicare, Uninsured [facility]: [phone] [facility]: [phone] Hours: 8:30AM-7PM Insurance Accepted: Aetna, BCBS, Medicaid, Medicare, Tricare, [employer] [facility]:  [phone]              Hours:9AM-5PM Insurance Accepted:  Aetna, BCBS, [facility], Medicaid, United Health Care  [facility] [phone] Hours: 9AM-5:30PM Insurance Accepted: AARP, Aetna, BCBS, Cigna, and Medicaid, Medicare, United Health Care [facility]:  [phone] Hours: 9am-5pm Insurance Accepted: BCBS; they do not accept Medicaid/Medicare   [facility]: [phone] Hours: 9am-9pm Insurance Accepted: All major insurance including Medicaid and Medicare [facility]: [phone] Hours: 9AM-5:30PM Insurance Accepted: All insurances EXCEPT Medicaid and Medicare. [facility]: [phone]                                                                       Parenting Support Groups [facility]: [phone] [facility]:  [phone] Family Support Network (support for children in the NICU and/or with special needs), [phone]                                                                   Mental Health Support Groups Mental [employer] Association: [phone]                                                                                     Online Resources: Postpartum Support International: http://www.postpartum.net/  800-944-4PPD 2Moms Supporting Moms:  www.momssupportingmoms.net     

## 2020-01-30 ENCOUNTER — Ambulatory Visit (INDEPENDENT_AMBULATORY_CARE_PROVIDER_SITE_OTHER): Payer: Medicaid Other | Admitting: Advanced Practice Midwife

## 2020-01-30 ENCOUNTER — Encounter: Payer: Self-pay | Admitting: Advanced Practice Midwife

## 2020-01-30 VITALS — BP 95/55 | HR 88 | Wt 158.0 lb

## 2020-01-30 DIAGNOSIS — R55 Syncope and collapse: Secondary | ICD-10-CM

## 2020-01-30 DIAGNOSIS — Z3403 Encounter for supervision of normal first pregnancy, third trimester: Secondary | ICD-10-CM

## 2020-01-30 DIAGNOSIS — F439 Reaction to severe stress, unspecified: Secondary | ICD-10-CM

## 2020-01-30 DIAGNOSIS — Z3400 Encounter for supervision of normal first pregnancy, unspecified trimester: Secondary | ICD-10-CM

## 2020-01-30 DIAGNOSIS — Z3A29 29 weeks gestation of pregnancy: Secondary | ICD-10-CM

## 2020-01-30 DIAGNOSIS — Z23 Encounter for immunization: Secondary | ICD-10-CM

## 2020-01-30 NOTE — Patient Instructions (Signed)

## 2020-01-30 NOTE — Progress Notes (Signed)
   PRENATAL VISIT NOTE  Subjective:  Vicki Harper is a 24 y.o. G1P0 at [redacted]w[redacted]d being seen today for ongoing prenatal care.  She is currently monitored for the following issues for this low-risk pregnancy and has Chronic pain of left knee; Supervision of normal first pregnancy, antepartum; Hyperemesis gravidarum; LGSIL on Pap smear of cervix; Asthma affecting pregnancy in first trimester; and Depression affecting pregnancy, antepartum on their problem list.  Patient reports near syncope yesterday (seen in [facility]), work stress (asking about FMLA), Nausea better.  Contractions: Not present. Vag. Bleeding: None.  Movement: Present. Denies leaking of fluid.   The following portions of the patient's history were reviewed and updated as appropriate: allergies, current medications, past family history, past medical history, past social history, past surgical history and problem list.   Objective:   Vitals:   01/30/20 1018  BP: (!) 95/55  Pulse: 88  Weight: 158 lb (71.7 kg)    Fetal Status: Fetal Heart Rate (bpm): 131   Movement: Present     General:  Alert, oriented and cooperative. Patient is in no acute distress.  Skin: Skin is warm and dry. No rash noted.   Cardiovascular: Normal heart rate noted  Respiratory: Normal respiratory effort, no problems with respiration noted  Abdomen: Soft, gravid, appropriate for gestational age.  Pain/Pressure: Absent     Pelvic: Cervical exam deferred        Extremities: Normal range of motion.  Edema: None  Mental Status: Normal mood and affect. Normal behavior. Normal judgment and thought content.   Assessment and Plan:  Pregnancy: G1P0 at [redacted]w[redacted]d 1. [redacted] weeks gestation of pregnancy      - Glucose Tolerance, 2 Hours w/1 Hour - RPR - HIV antibody (with reflex) - CBC - Tdap vaccine greater than or equal to 7yo IM  2. Supervision of normal first pregnancy, antepartum    Discussed FMLA, vs cutting hours.  Pt to ask supervisor what their requirements  are  3. Near syncope     Discussed hydration and rest     Long discussion of various stress relieving actions  4. Stress    See above.  OK to cut hours or take FMLA when needed   Discussed limits of 12 weeks  Preterm labor symptoms and general obstetric precautions including but not limited to vaginal bleeding, contractions, leaking of fluid and fetal movement were reviewed in detail with the patient. Please refer to After Visit Summary for other counseling recommendations.     Future Appointments  Date Time Provider [place]  03/11/2020  8:15 AM Eagar Pecos, CNM

## 2020-01-31 LAB — GLUCOSE TOLERANCE, 2 HOURS W/ 1HR
Glucose, 1 hour: 77 mg/dL (ref 65–179)
Glucose, 2 hour: 99 mg/dL (ref 65–152)
Glucose, Fasting: 73 mg/dL (ref 65–91)

## 2020-01-31 LAB — CBC
Hematocrit: 30.3 % — ABNORMAL LOW (ref 34.0–46.6)
Hemoglobin: 9.7 g/dL — ABNORMAL LOW (ref 11.1–15.9)
MCH: 28.4 pg (ref 26.6–33.0)
MCHC: 32 g/dL (ref 31.5–35.7)
MCV: 89 fL (ref 79–97)
Platelets: 256 10*3/uL (ref 150–450)
RBC: 3.42 x10E6/uL — ABNORMAL LOW (ref 3.77–5.28)
RDW: 11.8 % (ref 11.7–15.4)
WBC: 6.4 10*3/uL (ref 3.4–10.8)

## 2020-01-31 LAB — RPR: RPR Ser Ql: NONREACTIVE

## 2020-01-31 LAB — HIV ANTIBODY (ROUTINE TESTING W REFLEX): HIV Screen 4th Generation wRfx: NONREACTIVE

## 2020-02-01 ENCOUNTER — Encounter: Payer: Self-pay | Admitting: Advanced Practice Midwife

## 2020-02-01 ENCOUNTER — Other Ambulatory Visit: Payer: Self-pay | Admitting: Advanced Practice Midwife

## 2020-02-01 DIAGNOSIS — O99019 Anemia complicating pregnancy, unspecified trimester: Secondary | ICD-10-CM | POA: Insufficient documentation

## 2020-02-01 MED ORDER — FERROUS SULFATE 325 (65 FE) MG PO TABS: 325.0000 mg | ORAL_TABLET | Freq: Two times a day (BID) | ORAL | 1 refills | Status: DC

## 2020-02-01 NOTE — Progress Notes (Signed)
Hgb 9.7 Rx FeSO4 bid

## 2020-02-09 ENCOUNTER — Inpatient Hospital Stay (HOSPITAL_COMMUNITY)
Admission: AD | Admit: 2020-02-09 | Discharge: 2020-02-09 | Disposition: A | Discharge status: Home / Self Care | Payer: Medicaid Other | Source: Ambulatory Visit | Attending: Obstetrics and Gynecology | Admitting: Obstetrics and Gynecology

## 2020-02-09 ENCOUNTER — Other Ambulatory Visit: Payer: Self-pay

## 2020-02-09 ENCOUNTER — Encounter (HOSPITAL_COMMUNITY): Payer: Self-pay | Admitting: Obstetrics and Gynecology

## 2020-02-09 DIAGNOSIS — Z3A31 31 weeks gestation of pregnancy: Secondary | ICD-10-CM | POA: Insufficient documentation

## 2020-02-09 DIAGNOSIS — O99513 Diseases of the respiratory system complicating pregnancy, third trimester: Secondary | ICD-10-CM | POA: Diagnosis not present

## 2020-02-09 DIAGNOSIS — K5900 Constipation, unspecified: Secondary | ICD-10-CM

## 2020-02-09 DIAGNOSIS — O99613 Diseases of the digestive system complicating pregnancy, third trimester: Secondary | ICD-10-CM | POA: Insufficient documentation

## 2020-02-09 DIAGNOSIS — J45909 Unspecified asthma, uncomplicated: Secondary | ICD-10-CM | POA: Insufficient documentation

## 2020-02-09 DIAGNOSIS — Z79899 Other long term (current) drug therapy: Secondary | ICD-10-CM | POA: Insufficient documentation

## 2020-02-09 DIAGNOSIS — O212 Late vomiting of pregnancy: Secondary | ICD-10-CM | POA: Diagnosis present

## 2020-02-09 DIAGNOSIS — O26899 Other specified pregnancy related conditions, unspecified trimester: Secondary | ICD-10-CM

## 2020-02-09 DIAGNOSIS — R110 Nausea: Secondary | ICD-10-CM

## 2020-02-09 DIAGNOSIS — O26893 Other specified pregnancy related conditions, third trimester: Secondary | ICD-10-CM

## 2020-02-09 LAB — URINALYSIS, ROUTINE W REFLEX MICROSCOPIC
Bilirubin Urine: NEGATIVE
Glucose, UA: NEGATIVE mg/dL
Hgb urine dipstick: NEGATIVE
Ketones, ur: NEGATIVE mg/dL
Leukocytes,Ua: NEGATIVE
Nitrite: NEGATIVE
Protein, ur: NEGATIVE mg/dL
Specific Gravity, Urine: 1.017 (ref 1.005–1.030)
pH: 7 (ref 5.0–8.0)

## 2020-02-09 MED ORDER — METOCLOPRAMIDE HCL 10 MG PO TABS: 10.0000 mg | ORAL_TABLET | Freq: Three times a day (TID) | ORAL | 1 refills | Status: DC | PRN

## 2020-02-09 MED ORDER — DULCOLAX 5 MG PO TBEC: 5.0000 mg | DELAYED_RELEASE_TABLET | Freq: Every day | ORAL | 0 refills | Status: AC | PRN

## 2020-02-09 NOTE — Discharge Instructions (Signed)
Safe Medications in Pregnancy   Acne: Benzoyl Peroxide Salicylic Acid  Backache/Headache: Tylenol: 2 regular strength every 4 hours OR              2 Extra strength every 6 hours  Colds/Coughs/Allergies: Benadryl (alcohol free) 25 mg every 6 hours as needed Breath right strips Claritin Cepacol throat lozenges Chloraseptic throat spray Cold-Eeze- up to three times per day Cough drops, alcohol free Flonase (by prescription only) Guaifenesin Mucinex Robitussin DM (plain only, alcohol free) Saline nasal spray/drops Sudafed (pseudoephedrine) & Actifed ** use only after [redacted] weeks gestation and if you do not have high blood pressure Tylenol Vicks Vaporub Zinc lozenges Zyrtec   Constipation: Colace Ducolax suppositories Fleet enema Glycerin suppositories Metamucil Milk of magnesia Miralax Senokot Smooth move tea  Diarrhea: Kaopectate Imodium A-D  *NO pepto Bismol  Hemorrhoids: Anusol Anusol HC Preparation H Tucks  Indigestion: Tums Maalox Mylanta Zantac  Pepcid  Insomnia: Benadryl (alcohol free) 25mg  every 6 hours as needed Tylenol PM Unisom, no Gelcaps  Leg Cramps: Tums MagGel  Nausea/Vomiting:  Bonine Dramamine Emetrol Ginger extract Sea bands Meclizine  Nausea medication to take during pregnancy:  Unisom (doxylamine succinate 25 mg tablets) Take one tablet daily at bedtime. If symptoms are not adequately controlled, the dose can be increased to a maximum recommended dose of two tablets daily (1/2 tablet in the morning, 1/2 tablet mid-afternoon and one at bedtime). Vitamin B6 100mg  tablets. Take one tablet twice a day (up to 200 mg per day).  Skin Rashes: Aveeno products Benadryl cream or 25mg  every 6 hours as needed Calamine Lotion 1% cortisone cream  Yeast infection: Gyne-lotrimin 7 Monistat 7  Gum/tooth pain: Anbesol  **If taking multiple medications, please check labels to avoid duplicating the same active ingredients **take  medication as directed on the label ** Do not exceed 4000 mg of tylenol in 24 hours **Do not take medications that contain aspirin or ibuprofen     Constipation, Adult Constipation is when a person has fewer bowel movements in a week than normal, has difficulty having a bowel movement, or has stools that are dry, hard, or larger than normal. Constipation may be caused by an underlying condition. It may become worse with age if a person takes certain medicines and does not take in enough fluids. Follow these instructions at home: Eating and drinking   Eat foods that have a lot of fiber, such as fresh fruits and vegetables, whole grains, and beans.  Limit foods that are high in fat, low in fiber, or overly processed, such as french fries, hamburgers, cookies, candies, and soda.  Drink enough fluid to keep your urine clear or pale yellow. General instructions  Exercise regularly or as told by your health care provider.  Go to the restroom when you have the urge to go. Do not hold it in.  Take over-the-counter and prescription medicines only as told by your health care provider. These include any fiber supplements.  Practice pelvic floor retraining exercises, such as deep breathing while relaxing the lower abdomen and pelvic floor relaxation during bowel movements.  Watch your condition for any changes.  Keep all follow-up visits as told by your health care provider. This is important. Contact a health care provider if:  You have pain that gets worse.  You have a fever.  You do not have a bowel movement after 4 days.  You vomit.  You are not hungry.  You lose weight.  You are bleeding from the anus.  You have thin, pencil-like stools. Get help right away if:  You have a fever and your symptoms suddenly get worse.  You leak stool or have blood in your stool.  Your abdomen is bloated.  You have severe pain in your abdomen.  You feel dizzy or you faint. This  information is not intended to replace advice given to you by your health care provider. Make sure you discuss any questions you have with your health care provider. Document Revised: 05/07/2017 Document Reviewed: 11/13/2015 Elsevier Patient Education  [place].  Morning Sickness  Morning sickness is when a woman feels nauseous during pregnancy. This nauseous feeling may or may not come with vomiting. It often occurs in the morning, but it can be a problem at any time of day. Morning sickness is most common during the first trimester. In some cases, it may continue throughout pregnancy. Although morning sickness is unpleasant, it is usually harmless unless the woman develops severe and continual vomiting (hyperemesis gravidarum), a condition that requires more intense treatment. What are the causes? The exact cause of this condition is not known, but it seems to be related to normal hormonal changes that occur in pregnancy. What increases the risk? You are more likely to develop this condition if:  You experienced nausea or vomiting before your pregnancy.  You had morning sickness during a previous pregnancy.  You are pregnant with more than one baby, such as twins. What are the signs or symptoms? Symptoms of this condition include:  Nausea.  Vomiting. How is this diagnosed? This condition is usually diagnosed based on your signs and symptoms. How is this treated? In many cases, treatment is not needed for this condition. Making some changes to what you eat may help to control symptoms. Your health care provider may also prescribe or recommend:  Vitamin B6 supplements.  Anti-nausea medicines.  Ginger. Follow these instructions at home: Medicines  Take over-the-counter and prescription medicines only as told by your health care provider. Do not use any prescription, over-the-counter, or herbal medicines for morning sickness without first talking with your health care  provider.  Taking multivitamins before getting pregnant can prevent or decrease the severity of morning sickness in most women. Eating and drinking  Eat a piece of dry toast or crackers before getting out of bed in the morning.  Eat 5 or 6 small meals a day.  Eat dry and bland foods, such as rice or a baked potato. Foods that are high in carbohydrates are often helpful.  Avoid greasy, fatty, and spicy foods.  Have someone cook for you if the smell of any food causes nausea and vomiting.  If you feel nauseous after taking prenatal vitamins, take the vitamins at night or with a snack.  Snack on protein foods between meals if you are hungry. Nuts, yogurt, and cheese are good options.  Drink fluids throughout the day.  Try ginger ale made with real ginger, ginger tea made from fresh grated ginger, or ginger candies. General instructions  Do not use any products that contain nicotine or tobacco, such as cigarettes and e-cigarettes. If you need help quitting, ask your health care provider.  Get an air purifier to keep the air in your house free of odors.  Get plenty of fresh air.  Try to avoid odors that trigger your nausea.  Consider trying these methods to help relieve symptoms: ? Wearing an acupressure wristband. These wristbands are often worn for seasickness. ? Acupuncture. Contact a health care  provider if:  Your home remedies are not working and you need medicine.  You feel dizzy or light-headed.  You are losing weight. Get help right away if:  You have persistent and uncontrolled nausea and vomiting.  You faint.  You have severe pain in your abdomen. Summary  Morning sickness is when a woman feels nauseous during pregnancy. This nauseous feeling may or may not come with vomiting.  Morning sickness is most common during the first trimester.  It often occurs in the morning, but it can be a problem at any time of day.  In many cases, treatment is not needed for  this condition. Making some changes to what you eat may help to control symptoms. This information is not intended to replace advice given to you by your health care provider. Make sure you discuss any questions you have with your health care provider. Document Revised: 05/07/2017 Document Reviewed: 06/27/2016 Elsevier Patient Education  2020 [employer].

## 2020-02-09 NOTE — MAU Provider Note (Signed)
Chief Complaint:  SOB, Nausea, and Pressure in Head   First Provider Initiated Contact with Patient 02/09/20 1246     HPI: Vicki Harper is a 24 y.o. G1P0 at [redacted]w[redacted]d who presents to maternity admissions reporting nausea & feeling dehydrated. Has had issues with n/v & ptyalism throughout pregnancy. Current regimen includes scopolamine & zofran. Currently reports nausea & spitting & states she feels like she's dehydration. Has been able to keep down food & fluids. Last BM was 11 days ago. Denies abdominal pain. Is not treating constipation.  Reports some pressure in a head that feels like when you have a migraine but states she doesn't have a migraine & does not have pain with the pressure. This has been ongoing for several weeks without change.  Also reports occasional shortness of breath. States this is consistent with her asthma & denies any changes in her symptoms today. Denies fever/chills, sore throat, cough, or chest pain. Last use albuterol inhaler this morning. States she has used her inhaler twice this week.   Pregnancy Course: [facility]  Past Medical History:  Diagnosis Date  . Asthma   . Back pain   . Headache    OB History  Gravida Para Term Preterm AB Living  1            SAB TAB Ectopic Multiple Live Births               # Outcome Date GA Lbr Len/2nd Weight Sex Delivery Anes PTL Lv  1 Current            Past Surgical History:  Procedure Laterality Date  . NO PAST SURGERIES     Family History  Problem Relation Age of Onset  . Migraines Mother    Social History   Tobacco Use  . Smoking status: Never Smoker  . Smokeless tobacco: Never Used  Vaping Use  . Vaping Use: Never used  Substance Use Topics  . Alcohol use: No    Alcohol/week: 0.0 standard drinks  . Drug use: No   No Known Allergies No medications prior to admission.    I have reviewed patient's Past Medical Hx, Surgical Hx, Family Hx, Social Hx, medications and allergies.   ROS:  Review of Systems   Constitutional: Negative.   Respiratory: Positive for shortness of breath (not currently). Negative for cough and wheezing.   Cardiovascular: Negative.   Gastrointestinal: Positive for constipation and nausea. Negative for abdominal pain, diarrhea and vomiting.  Genitourinary: Negative.   Neurological: Negative.     Physical Exam   Patient Vitals for the past 24 hrs:  BP Temp Temp src Pulse Resp SpO2 Height Weight  02/09/20 1430 106/65 -- -- 73 16 -- -- --  02/09/20 1146 (!) 103/55 98.5 F (36.9 C) Oral 95 18 100 % -- --  02/09/20 1141 -- -- -- -- -- -- 5\' 6"  (1.676 m) 73.1 kg    Constitutional: Well-developed, well-nourished female in no acute distress.  Cardiovascular: normal rate & rhythm, no murmur Respiratory: normal effort, lung sounds clear throughout GI: Abd soft, non-tender, gravid appropriate for gestational age. Pos BS x 4 MS: Extremities nontender, no edema, normal ROM Neurologic: Alert and oriented x 4.   Fetal Tracing:  Baseline: 130 Variability: moderate Accelerations: 15x15 Decelerations: none  Toco: none    Labs: Results for orders placed or performed during the hospital encounter of 02/09/20 (from the past 24 hour(s))  Urinalysis, Routine w reflex microscopic Urine, Clean Catch  Status: None   Collection Time: 02/09/20 12:21 PM  Result Value Ref Range   Color, Urine YELLOW YELLOW   APPearance CLEAR CLEAR   Specific Gravity, Urine 1.017 1.005 - 1.030   pH 7.0 5.0 - 8.0   Glucose, UA NEGATIVE NEGATIVE mg/dL   Hgb urine dipstick NEGATIVE NEGATIVE   Bilirubin Urine NEGATIVE NEGATIVE   Ketones, ur NEGATIVE NEGATIVE mg/dL   Protein, ur NEGATIVE NEGATIVE mg/dL   Nitrite NEGATIVE NEGATIVE   Leukocytes,Ua NEGATIVE NEGATIVE    Imaging:  No results found.  MAU Course: Orders Placed This Encounter  Procedures  . Urinalysis, Routine w reflex microscopic Urine, Clean Catch  . Discharge patient   Meds ordered this encounter  Medications  .  metoCLOPramide (REGLAN) 10 MG tablet    Sig: Take 1 tablet (10 mg total) by mouth every 8 (eight) hours as needed for nausea.    Dispense:  30 tablet    Refill:  1    Order Specific Question:   Supervising Provider    Answer:   Lynnda Shields A [[id number]]  . bisacodyl (DULCOLAX) 5 MG EC tablet    Sig: Take 1 tablet (5 mg total) by mouth daily as needed for moderate constipation.    Dispense:  30 tablet    Refill:  0    Order Specific Question:   Supervising Provider    Answer:   Griffin Basil [id number]    MDM: Patient complains of head pressure, feeling dehydrated, nausea, & shortness of breath. Reports no new changes in her symptoms today. Mainly here today because she states she feels dehydrated. She is primarily spitting in MAU; has not vomited. Vital signs are stable. U/s is normal & shows no signs of dehydration. Discussed continued treatment of her symptoms at home. She is constipated, likely due to zofran & iron supplement, but denies any abdominal pain. Will rx reglan to take on schedule & reserve zofran for prn. Also discussed use of dulcolax.   Shortness of breath is not a new symptom & is currently not present. She states it feels no different than her asthma. Lung sounds are clear throughout. SpO2 is 100%. No other concerning signs that would require further workup.   Assessment: 1. Pregnancy related nausea, antepartum   2. Asthma affecting pregnancy in first trimester   3. [redacted] weeks gestation of pregnancy   4. Constipation during pregnancy in third trimester     Plan: Discharge home in stable condition.  Rx reglan & dulcolax Discussed reasons to return to MAU    Allergies as of 02/09/2020   No Known Allergies     Medication List    STOP taking these medications   prenatal multivitamin Tabs tablet   promethazine 12.5 MG tablet Commonly known as: PHENERGAN     TAKE these medications   albuterol 108 (90 Base) MCG/ACT inhaler Commonly known as: VENTOLIN  HFA Inhale 1-2 puffs into the lungs every 6 (six) hours as needed for wheezing or shortness of breath.   Comfort Fit Maternity Supp Med Misc Wear belt during the day, as needed, and remove at night prior to bedtime.   Dulcolax 5 MG EC tablet Generic drug: bisacodyl Take 1 tablet (5 mg total) by mouth daily as needed for moderate constipation.   famotidine 20 MG tablet Commonly known as: PEPCID Take 1 tablet (20 mg total) by mouth 2 (two) times daily.   ferrous sulfate 325 (65 FE) MG tablet Commonly known as: FerrouSul Take 1 tablet (325  mg total) by mouth 2 (two) times daily.   metoCLOPramide 10 MG tablet Commonly known as: REGLAN Take 1 tablet (10 mg total) by mouth every 8 (eight) hours as needed for nausea.   ondansetron 4 MG disintegrating tablet Commonly known as: ZOFRAN-ODT Take 2 tablets (8 mg total) by mouth every 8 (eight) hours as needed.   Prenatal Vitamin 27-0.8 MG Tabs Take 1 tablet by mouth daily.   scopolamine 1 MG/3DAYS Commonly known as: TRANSDERM-SCOP Place 1 patch (1.5 mg total) onto the skin every 3 (three) days.   sertraline 25 MG tablet Commonly known as: Zoloft Take 1 tablet (25 mg total) by mouth daily.       Jorje Guild, NP 02/09/2020 3:07 PM

## 2020-02-09 NOTE — MAU Note (Signed)
Presents with c/o dehydration, hasn't been vomiting but nauseated.  Also c/o SOB, states difficulty catching breath.  Also states she has pressure in her head, a pulsating sensation.  Denies VB or LOF.  Endorses +FM.

## 2020-02-13 ENCOUNTER — Encounter: Payer: Self-pay | Admitting: Nurse Practitioner

## 2020-02-13 ENCOUNTER — Ambulatory Visit (INDEPENDENT_AMBULATORY_CARE_PROVIDER_SITE_OTHER): Payer: Medicaid Other | Admitting: Nurse Practitioner

## 2020-02-13 ENCOUNTER — Other Ambulatory Visit: Payer: Self-pay

## 2020-02-13 ENCOUNTER — Other Ambulatory Visit (HOSPITAL_COMMUNITY)
Admission: RE | Admit: 2020-02-13 | Discharge: 2020-02-13 | Disposition: A | Discharge status: Home / Self Care | Payer: Medicaid Other | Source: Ambulatory Visit | Attending: Nurse Practitioner | Admitting: Nurse Practitioner

## 2020-02-13 VITALS — BP 104/57 | HR 68 | Wt 158.0 lb

## 2020-02-13 DIAGNOSIS — Z3400 Encounter for supervision of normal first pregnancy, unspecified trimester: Secondary | ICD-10-CM

## 2020-02-13 DIAGNOSIS — L292 Pruritus vulvae: Secondary | ICD-10-CM | POA: Diagnosis present

## 2020-02-13 DIAGNOSIS — Z3A31 31 weeks gestation of pregnancy: Secondary | ICD-10-CM

## 2020-02-13 DIAGNOSIS — B379 Candidiasis, unspecified: Secondary | ICD-10-CM

## 2020-02-13 NOTE — Progress Notes (Signed)
    Subjective:  Vicki Harper is a 24 y.o. G1P0 at [redacted]w[redacted]d being seen today for ongoing prenatal care.  She is currently monitored for the following issues for this low-risk pregnancy and has Chronic pain of left knee; Supervision of normal first pregnancy, antepartum; Hyperemesis gravidarum; LGSIL on Pap smear of cervix; Asthma affecting pregnancy in first trimester; Depression affecting pregnancy, antepartum; and Anemia affecting pregnancy on their problem list.  Patient reports reports she is doing well and has not had to use her albuterol this week for any shortness of breath. Is having vulvar itching after using a new shaving cream and shaving.  Contractions: Irregular. Vag. Bleeding: None.  Movement: Present. Denies leaking of fluid.   The following portions of the patient's history were reviewed and updated as appropriate: allergies, current medications, past family history, past medical history, past social history, past surgical history and problem list. Problem list updated.  Objective:   Vitals:   02/13/20 1016  BP: (!) 104/57  Pulse: 68  Weight: 158 lb (71.7 kg)    Fetal Status: Fetal Heart Rate (bpm): 130 Fundal Height: 34 cm Movement: Present     General:  Alert, oriented and cooperative. Patient is in no acute distress.  Skin: Skin is warm and dry. No rash noted.   Cardiovascular: Normal heart rate noted  Respiratory: Normal respiratory effort, no problems with respiration noted  Abdomen: Soft, gravid, appropriate for gestational age. Pain/Pressure: Present     Pelvic:  Cervical exam deferred       Has hypopigmented healed spots on right inner thigh and labia majora.  Nontender spots.  Near the spots in the fold of skin at the labia majora and thigh, there is a 13mm fissure.  HSV culture done.  Extremities: Normal range of motion.  Edema: None  Mental Status: Normal mood and affect. Normal behavior. Normal judgment and thought content.   Urinalysis:      Assessment and  Plan:  Pregnancy: G1P0 at [redacted]w[redacted]d  1. Supervision of normal first pregnancy, antepartum  Weight stable and reports she is eating well.  No vomiting.  Only has nausea from time to time.  Still has ptyalism. Advised childbirth classes and breastfeeding virtual classes.  2. Vulvar itching Used some cream and shaved.  Now has external itching and external fissure - need to rule out HSV as cause vs shaving.  Keep fissure dry to promote healing.  Use a hair dryer on cool rather than cool compresses.  - Herpes simplex virus culture - Cervicovaginal ancillary only( [place])  Preterm labor symptoms and general obstetric precautions including but not limited to vaginal bleeding, contractions, leaking of fluid and fetal movement were reviewed in detail with the patient. Please refer to After Visit Summary for other counseling recommendations.  Return in about 2 weeks (around 02/27/2020) for in person ROB.  Earlie Server, RN, MSN, NP-BC Nurse Practitioner, [facility] for [employer], [facility] 02/13/2020 10:47 AM

## 2020-02-13 NOTE — Patient Instructions (Signed)
[email or web address] for virtual classes

## 2020-02-14 LAB — CERVICOVAGINAL ANCILLARY ONLY
Bacterial Vaginitis (gardnerella): NEGATIVE
Candida Glabrata: NEGATIVE
Candida Vaginitis: POSITIVE — AB
Comment: NEGATIVE
Comment: NEGATIVE
Comment: NEGATIVE

## 2020-02-14 MED ORDER — TERCONAZOLE 0.4 % VA CREA: 1.0000 | TOPICAL_CREAM | Freq: Every day | VAGINAL | 0 refills | Status: DC

## 2020-02-14 NOTE — Addendum Note (Signed)
Addended by: Virginia Rochester on: 02/14/2020 08:51 PM   Modules accepted: Orders

## 2020-02-18 LAB — HERPES SIMPLEX VIRUS CULTURE

## 2020-02-27 ENCOUNTER — Encounter: Payer: Medicaid Other | Admitting: Advanced Practice Midwife

## 2020-03-05 ENCOUNTER — Encounter: Payer: Self-pay | Admitting: Advanced Practice Midwife

## 2020-03-05 ENCOUNTER — Other Ambulatory Visit: Payer: Self-pay

## 2020-03-05 ENCOUNTER — Ambulatory Visit (INDEPENDENT_AMBULATORY_CARE_PROVIDER_SITE_OTHER): Payer: Medicaid Other | Admitting: Advanced Practice Midwife

## 2020-03-05 VITALS — BP 113/63 | HR 77 | Wt 159.0 lb

## 2020-03-05 DIAGNOSIS — O210 Mild hyperemesis gravidarum: Secondary | ICD-10-CM

## 2020-03-05 DIAGNOSIS — K219 Gastro-esophageal reflux disease without esophagitis: Secondary | ICD-10-CM

## 2020-03-05 MED ORDER — ONDANSETRON 4 MG PO TBDP
4.0000 mg | ORAL_TABLET | Freq: Four times a day (QID) | ORAL | 0 refills | Status: DC | PRN
Start: 2020-03-05 — End: 2020-03-21

## 2020-03-05 MED ORDER — PANTOPRAZOLE SODIUM 20 MG PO TBEC
20.0000 mg | DELAYED_RELEASE_TABLET | Freq: Every day | ORAL | 0 refills | Status: DC
Start: 2020-03-05 — End: 2020-04-15

## 2020-03-05 MED FILL — ONDANSETRON ODT 4 MG TABLET: 4 | 5 days supply | Qty: 20 | Fill #0

## 2020-03-05 MED FILL — PANTOPRAZOLE SOD DR 20 MG T: 20 | 30 days supply | Qty: 30 | Fill #0

## 2020-03-05 NOTE — Patient Instructions (Signed)
Third Trimester of Pregnancy The third trimester is from week 28 through week 40 (months 7 through 9). The third trimester is a time when the unborn baby (fetus) is growing rapidly. At the end of the ninth month, the fetus is about 20 inches in length and weighs 6-10 pounds. Body changes during your third trimester Your body will continue to go through many changes during pregnancy. The changes vary from woman to woman. During the third trimester:  Your weight will continue to increase. You can expect to gain 25-35 pounds (11-16 kg) by the end of the pregnancy.  You may begin to get stretch marks on your hips, abdomen, and breasts.  You may urinate more often because the fetus is moving lower into your pelvis and pressing on your bladder.  You may develop or continue to have heartburn. This is caused by increased hormones that slow down muscles in the digestive tract.  You may develop or continue to have constipation because increased hormones slow digestion and cause the muscles that push waste through your intestines to relax.  You may develop hemorrhoids. These are swollen veins (varicose veins) in the rectum that can itch or be painful.  You may develop swollen, bulging veins (varicose veins) in your legs.  You may have increased body aches in the pelvis, back, or thighs. This is due to weight gain and increased hormones that are relaxing your joints.  You may have changes in your hair. These can include thickening of your hair, rapid growth, and changes in texture. Some women also have hair loss during or after pregnancy, or hair that feels dry or thin. Your hair will most likely return to normal after your baby is born.  Your breasts will continue to grow and they will continue to become tender. A yellow fluid (colostrum) may leak from your breasts. This is the first milk you are producing for your baby.  Your belly button may stick out.  You may notice more swelling in your hands,  face, or ankles.  You may have increased tingling or numbness in your hands, arms, and legs. The skin on your belly may also feel numb.  You may feel short of breath because of your expanding uterus.  You may have more problems sleeping. This can be caused by the size of your belly, increased need to urinate, and an increase in your body's metabolism.  You may notice the fetus "dropping," or moving lower in your abdomen (lightening).  You may have increased vaginal discharge.  You may notice your joints feel loose and you may have pain around your pelvic bone. What to expect at prenatal visits You will have prenatal exams every 2 weeks until week 36. Then you will have weekly prenatal exams. During a routine prenatal visit:  You will be weighed to make sure you and the baby are growing normally.  Your blood pressure will be taken.  Your abdomen will be measured to track your baby's growth.  The fetal heartbeat will be listened to.  Any test results from the previous visit will be discussed.  You may have a cervical check near your due date to see if your cervix has softened or thinned (effaced).  You will be tested for Group B streptococcus. This happens between 35 and 37 weeks. Your health care provider may ask you:  What your birth plan is.  How you are feeling.  If you are feeling the baby move.  If you have had any abnormal   symptoms, such as leaking fluid, bleeding, severe headaches, or abdominal cramping.  If you are using any tobacco products, including cigarettes, chewing tobacco, and electronic cigarettes.  If you have any questions. Other tests or screenings that may be performed during your third trimester include:  Blood tests that check for low iron levels (anemia).  Fetal testing to check the health, activity level, and growth of the fetus. Testing is done if you have certain medical conditions or if there are problems during the pregnancy.  Nonstress test  (NST). This test checks the health of your baby to make sure there are no signs of problems, such as the baby not getting enough oxygen. During this test, a belt is placed around your belly. The baby is made to move, and its heart rate is monitored during movement. What is false labor? False labor is a condition in which you feel small, irregular tightenings of the muscles in the womb (contractions) that usually go away with rest, changing position, or drinking water. These are called Braxton Hicks contractions. Contractions may last for hours, days, or even weeks before true labor sets in. If contractions come at regular intervals, become more frequent, increase in intensity, or become painful, you should see your health care provider. What are the signs of labor?  Abdominal cramps.  Regular contractions that start at 10 minutes apart and become stronger and more frequent with time.  Contractions that start on the top of the uterus and spread down to the lower abdomen and back.  Increased pelvic pressure and dull back pain.  A watery or bloody mucus discharge that comes from the vagina.  Leaking of amniotic fluid. This is also known as your "water breaking." It could be a slow trickle or a gush. Let your health care provider know if it has a color or strange odor. If you have any of these signs, call your health care provider right away, even if it is before your due date. Follow these instructions at home: Medicines  Follow your health care provider's instructions regarding medicine use. Specific medicines may be either safe or unsafe to take during pregnancy.  Take a prenatal vitamin that contains at least 600 micrograms (mcg) of folic acid.  If you develop constipation, try taking a stool softener if your health care provider approves. Eating and drinking   Eat a balanced diet that includes fresh fruits and vegetables, whole grains, good sources of protein such as meat, eggs, or tofu,  and low-fat dairy. Your health care provider will help you determine the amount of weight gain that is right for you.  Avoid raw meat and uncooked cheese. These carry germs that can cause birth defects in the baby.  If you have low calcium intake from food, talk to your health care provider about whether you should take a daily calcium supplement.  Eat four or five small meals rather than three large meals a day.  Limit foods that are high in fat and processed sugars, such as fried and sweet foods.  To prevent constipation: ? Drink enough fluid to keep your urine clear or pale yellow. ? Eat foods that are high in fiber, such as fresh fruits and vegetables, whole grains, and beans. Activity  Exercise only as directed by your health care provider. Most women can continue their usual exercise routine during pregnancy. Try to exercise for 30 minutes at least 5 days a week. Stop exercising if you experience uterine contractions.  Avoid heavy lifting.  Do   not exercise in extreme heat or humidity, or at high altitudes.  Wear low-heel, comfortable shoes.  Practice good posture.  You may continue to have sex unless your health care provider tells you otherwise. Relieving pain and discomfort  Take frequent breaks and rest with your legs elevated if you have leg cramps or low back pain.  Take warm sitz baths to soothe any pain or discomfort caused by hemorrhoids. Use hemorrhoid cream if your health care provider approves.  Wear a good support bra to prevent discomfort from breast tenderness.  If you develop varicose veins: ? Wear support pantyhose or compression stockings as told by your healthcare provider. ? Elevate your feet for 15 minutes, 3-4 times a day. Prenatal care  Write down your questions. Take them to your prenatal visits.  Keep all your prenatal visits as told by your health care provider. This is important. Safety  Wear your seat belt at all times when driving.  Make  a list of emergency phone numbers, including numbers for family, friends, the hospital, and police and fire departments. General instructions  Avoid cat litter boxes and soil used by cats. These carry germs that can cause birth defects in the baby. If you have a cat, ask someone to clean the litter box for you.  Do not travel far distances unless it is absolutely necessary and only with the approval of your health care provider.  Do not use hot tubs, steam rooms, or saunas.  Do not drink alcohol.  Do not use any products that contain nicotine or tobacco, such as cigarettes and e-cigarettes. If you need help quitting, ask your health care provider.  Do not use any medicinal herbs or unprescribed drugs. These chemicals affect the formation and growth of the baby.  Do not douche or use tampons or scented sanitary pads.  Do not cross your legs for long periods of time.  To prepare for the arrival of your baby: ? Take prenatal classes to understand, practice, and ask questions about labor and delivery. ? Make a trial run to the hospital. ? Visit the hospital and tour the maternity area. ? Arrange for maternity or paternity leave through employers. ? Arrange for family and friends to take care of pets while you are in the hospital. ? Purchase a rear-facing car seat and make sure you know how to install it in your car. ? Pack your hospital bag. ? Prepare the baby's nursery. Make sure to remove all pillows and stuffed animals from the baby's crib to prevent suffocation.  Visit your dentist if you have not gone during your pregnancy. Use a soft toothbrush to brush your teeth and be gentle when you floss. Contact a health care provider if:  You are unsure if you are in labor or if your water has broken.  You become dizzy.  You have mild pelvic cramps, pelvic pressure, or nagging pain in your abdominal area.  You have lower back pain.  You have persistent nausea, vomiting, or  diarrhea.  You have an unusual or bad smelling vaginal discharge.  You have pain when you urinate. Get help right away if:  Your water breaks before 37 weeks.  You have regular contractions less than 5 minutes apart before 37 weeks.  You have a fever.  You are leaking fluid from your vagina.  You have spotting or bleeding from your vagina.  You have severe abdominal pain or cramping.  You have rapid weight loss or weight gain.  You have   shortness of breath with chest pain.  You notice sudden or extreme swelling of your face, hands, ankles, feet, or legs.  Your baby makes fewer than 10 movements in 2 hours.  You have severe headaches that do not go away when you take medicine.  You have vision changes. Summary  The third trimester is from week 28 through week 40, months 7 through 9. The third trimester is a time when the unborn baby (fetus) is growing rapidly.  During the third trimester, your discomfort may increase as you and your baby continue to gain weight. You may have abdominal, leg, and back pain, sleeping problems, and an increased need to urinate.  During the third trimester your breasts will keep growing and they will continue to become tender. A yellow fluid (colostrum) may leak from your breasts. This is the first milk you are producing for your baby.  False labor is a condition in which you feel small, irregular tightenings of the muscles in the womb (contractions) that eventually go away. These are called Braxton Hicks contractions. Contractions may last for hours, days, or even weeks before true labor sets in.  Signs of labor can include: abdominal cramps; regular contractions that start at 10 minutes apart and become stronger and more frequent with time; watery or bloody mucus discharge that comes from the vagina; increased pelvic pressure and dull back pain; and leaking of amniotic fluid. This information is not intended to replace advice given to you by your  health care provider. Make sure you discuss any questions you have with your health care provider. Document Revised: 09/15/2018 Document Reviewed: 06/30/2016 Elsevier Patient Education  2020 [employer]  

## 2020-03-05 NOTE — Progress Notes (Signed)
   PRENATAL VISIT NOTE  Subjective:  Vicki Harper is a 24 y.o. G1P0 at [redacted]w[redacted]d being seen today for ongoing prenatal care.  She is currently monitored for the following issues for this low-risk pregnancy and has Chronic pain of left knee; Supervision of normal first pregnancy, antepartum; Hyperemesis gravidarum; LGSIL on Pap smear of cervix; Asthma affecting pregnancy in first trimester; Depression affecting pregnancy, antepartum; and Anemia affecting pregnancy on their problem list.  Patient reports nausea stable, but having rash where patch is.  FOB wants a 3D [place].  Still has reflux at night.  .  Contractions: Irritability. Vag. Bleeding: None.  Movement: Present. Denies leaking of fluid.   The following portions of the patient's history were reviewed and updated as appropriate: allergies, current medications, past family history, past medical history, past social history, past surgical history and problem list.   Objective:   Vitals:   03/05/20 1054  BP: 113/63  Pulse: 77  Weight: 159 lb (72.1 kg)    Fetal Status: Fetal Heart Rate (bpm): 130   Movement: Present     General:  Alert, oriented and cooperative. Patient is in no acute distress.  Skin: Skin is warm and dry. No rash noted.   Cardiovascular: Normal heart rate noted  Respiratory: Normal respiratory effort, no problems with respiration noted  Abdomen: Soft, gravid, appropriate for gestational age.  Pain/Pressure: Present     Pelvic: Cervical exam deferred        Extremities: Normal range of motion.  Edema: None  Mental Status: Normal mood and affect. Normal behavior. Normal judgment and thought content.   Assessment and Plan:  Pregnancy: G1P0 at [redacted]w[redacted]d 1. Hyperemesis gravidarum    Rx Zofran renewed.  May try moving patch, but is prob becoming sensitive to adhesive.  Has a lot of seasonal allergies  2.   Reflux     Switch from Pepcid to Protonix  3.    Discussed we cannot order 3D.  May look into vanity [place]  Preterm labor  symptoms and general obstetric precautions including but not limited to vaginal bleeding, contractions, leaking of fluid and fetal movement were reviewed in detail with the patient. Please refer to After Visit Summary for other counseling recommendations.   Return in about 1 week (around 03/12/2020) for [employer].  Future Appointments  Date Time Provider [place]  03/11/2020  8:15 AM [facility]  03/19/2020  8:50 AM Seabron Spates, CNM [facility] None    Hansel Feinstein, CNM

## 2020-03-11 ENCOUNTER — Ambulatory Visit (INDEPENDENT_AMBULATORY_CARE_PROVIDER_SITE_OTHER): Payer: Medicaid Other | Admitting: Clinical

## 2020-03-11 DIAGNOSIS — F4323 Adjustment disorder with mixed anxiety and depressed mood: Secondary | ICD-10-CM

## 2020-03-11 NOTE — BH Specialist Note (Signed)
.Integrated Behavioral Health via Telemedicine Video (Caregility) Visit  03/11/2020 Vicki Harper [id number]  Number of [place] visits: 4 Session Start time: 8:15  Session End time: 8:33 Total time: 18 minutes  Referring Provider: Hansel Feinstein, CNM Type of Service: Individual Patient/Family location: Home [facility] Provider location: [facility] [place] [facility]  All persons participating in visit: Patient Vicki Harper and Vicki Harper     I connected with Vicki Harper  by a video enabled telemedicine application (Caregility) and verified that I am speaking with the correct person using two identifiers.   Discussed confidentiality: at previous visit  Confirmed demographics & insurance:  Yes   I discussed that engaging in this virtual visit, they consent to the provision of behavioral healthcare and the services will be billed under their insurance.   Patient and/or legal guardian expressed understanding and consented to virtual visit: Yes   PRESENTING CONCERNS: Patient and/or family reports the following symptoms/concerns: Pt states her primary symptoms today are fatigue, worry about childbirth and irritability; is taking Zoloft as prescribed, feels she is well-prepared for baby, taking time off from work and taking naps throughout the day as needed, and has registered for childbirth education class for this week.  Duration of problem: Current pregnancy; Severity of problem: mild  STRENGTHS (Protective Factors/Coping Skills): Strong social support  ASSESSMENT: Patient currently experiencing Adjustment disorder with mixed anxiety and depressed mood.    GOALS ADDRESSED: Patient will: 1.  Reduce symptoms of: anxiety and depression  2.  Demonstrate ability to: Increase healthy adjustment to current life circumstances   Progress of Goals: Ongoing  INTERVENTIONS: Interventions utilized:  Supportive Counseling,  Medication Monitoring and Psychoeducation and/or Health Education Standardized Assessments completed & reviewed: GAD-7 and PHQ 9   OUTCOME: Patient Response: Pt agrees to treatment plan   PLAN: 1. Follow up with behavioral health clinician on : two weeks postpartum 2. Behavioral recommendations:  -Continue taking prenatal vitamin and Zoloft as prescribed daily -Continue with plan to attend childbirth education class -Continue plan to register for and attend [employer] [facility] [employer] class -Continue prioritizing self-care during remainder of pregnancy, including taking naps during the daytime, as needed 3. Referral(s): [place] (In Clinic)  I discussed the assessment and treatment plan with the patient and/or parent/guardian. They were provided an opportunity to ask questions and all were answered. They agreed with the plan and demonstrated an understanding of the instructions.   They were advised to call back or seek an in-person evaluation as appropriate.  I discussed that the purpose of this visit is to provide behavioral health care while limiting exposure to the novel coronavirus.  Discussed there is a possibility of technology failure and discussed alternative modes of communication if that failure occurs.  [name] McMannes  Depression screen [facility] 2/9 03/11/2020 01/01/2020 12/18/2019  Decreased Interest 0 1 1  Down, Depressed, Hopeless 2 1 2   PHQ - 2 Score 2 2 3   Altered sleeping 0 0 0  Tired, decreased energy 3 1 2   Change in appetite 0 0 0  Feeling bad or failure about yourself  1 1 2   Trouble concentrating 0 0 0  Moving slowly or fidgety/restless 0 0 0  Suicidal thoughts 0 0 1  PHQ-9 Score 6 4 8    GAD 7 : Generalized Anxiety Score 03/11/2020 01/01/2020 12/18/2019  Nervous, Anxious, on Edge 0 0 1  Control/stop worrying 3 1 2   Worry too much - different  things 0 1 2  Trouble relaxing 0 0 1  Restless 0 0 0  Easily annoyed or irritable 3 2 3   Afraid -  awful might happen 0 1 0  Total GAD 7 Score 6 5 9

## 2020-03-15 ENCOUNTER — Ambulatory Visit (INDEPENDENT_AMBULATORY_CARE_PROVIDER_SITE_OTHER): Payer: Medicaid Other | Admitting: Family Medicine

## 2020-03-15 ENCOUNTER — Other Ambulatory Visit: Payer: Self-pay

## 2020-03-15 ENCOUNTER — Other Ambulatory Visit (HOSPITAL_COMMUNITY)
Admission: RE | Admit: 2020-03-15 | Discharge: 2020-03-15 | Disposition: A | Discharge status: Home / Self Care | Payer: Medicaid Other | Source: Ambulatory Visit | Attending: Family Medicine | Admitting: Family Medicine

## 2020-03-15 VITALS — BP 114/61 | HR 81 | Wt 166.0 lb

## 2020-03-15 DIAGNOSIS — Z3403 Encounter for supervision of normal first pregnancy, third trimester: Secondary | ICD-10-CM | POA: Diagnosis not present

## 2020-03-15 DIAGNOSIS — O99340 Other mental disorders complicating pregnancy, unspecified trimester: Secondary | ICD-10-CM

## 2020-03-15 DIAGNOSIS — O99013 Anemia complicating pregnancy, third trimester: Secondary | ICD-10-CM

## 2020-03-15 DIAGNOSIS — M9908 Segmental and somatic dysfunction of rib cage: Secondary | ICD-10-CM

## 2020-03-15 DIAGNOSIS — O210 Mild hyperemesis gravidarum: Secondary | ICD-10-CM

## 2020-03-15 DIAGNOSIS — Z3A36 36 weeks gestation of pregnancy: Secondary | ICD-10-CM

## 2020-03-15 DIAGNOSIS — M9902 Segmental and somatic dysfunction of thoracic region: Secondary | ICD-10-CM

## 2020-03-15 DIAGNOSIS — F32A Depression, unspecified: Secondary | ICD-10-CM

## 2020-03-15 DIAGNOSIS — M546 Pain in thoracic spine: Secondary | ICD-10-CM

## 2020-03-15 NOTE — Progress Notes (Signed)
   PRENATAL VISIT NOTE  Subjective:  Vicki Harper is a 24 y.o. G1P0 at [redacted]w[redacted]d being seen today for ongoing prenatal care.  She is currently monitored for the following issues for this low-risk pregnancy and has Chronic pain of left knee; Supervision of normal first pregnancy, antepartum; Hyperemesis gravidarum; LGSIL on Pap smear of cervix; Asthma affecting pregnancy in first trimester; Depression affecting pregnancy, antepartum; and Anemia affecting pregnancy on their problem list.  Patient reports mid back pain on right. no radiation..  Contractions: Irregular. Vag. Bleeding: None.  Movement: Present. Denies leaking of fluid.   The following portions of the patient's history were reviewed and updated as appropriate: allergies, current medications, past family history, past medical history, past social history, past surgical history and problem list. Problem list updated.  Objective:   Vitals:   03/15/20 0850  BP: 114/61  Pulse: 81  Weight: 166 lb (75.3 kg)    Fetal Status: Fetal Heart Rate (bpm): 135   Movement: Present     General:  Alert, oriented and cooperative. Patient is in no acute distress.  Skin: Skin is warm and dry. No rash noted.   Cardiovascular: Normal heart rate noted  Respiratory: Normal respiratory effort, no problems with respiration noted  Abdomen: Soft, gravid, appropriate for gestational age. Pain/Pressure: Present     Pelvic:  Cervical exam deferred        MSK: Restriction, tenderness, tissue texture changes, and paraspinal spasm in the thoracic spine  Neuro: Moves all four extremities with no focal neurological deficit  Extremities: Normal range of motion.  Edema: None  Mental Status: Normal mood and affect. Normal behavior. Normal judgment and thought content.   OSE: Head   Cervical   Thoracic t7-8 FSRR  Rib 7-8 inhaled  Lumbar   Sacrum   Pelvis     Assessment and Plan:  Pregnancy: G1P0 at [redacted]w[redacted]d  1. [redacted] weeks gestation of pregnancy - Culture, beta  strep (group b only) - GC/Chlamydia probe amp ([place])not at [facility]  2. Encounter for supervision of normal first pregnancy in third trimester - Culture, beta strep (group b only) - GC/Chlamydia probe amp ([place])not at [facility]  3. Hyperemesis gravidarum Continue scopolamine patch  4. Depression affecting pregnancy, antepartum  5. Anemia affecting pregnancy in third trimester On iron  6. Acute right-sided thoracic back pain 7. Somatic dysfunction of rib 8. Somatic dysfunction of spine, thoracic OMT done after patient permission. HVLA technique utilized. 2 areas treated with improvement of tissue texture and joint mobility. Patient tolerated procedure well.    Preterm labor symptoms and general obstetric precautions including but not limited to vaginal bleeding, contractions, leaking of fluid and fetal movement were reviewed in detail with the patient. Please refer to After Visit Summary for other counseling recommendations.  Return in about 1 week (around 03/22/2020) for OB f/u.  Truett Mainland, DO

## 2020-03-17 LAB — GC/CHLAMYDIA PROBE AMP (~~LOC~~) NOT AT ARMC
Chlamydia: NEGATIVE
Comment: NEGATIVE
Comment: NORMAL
Neisseria Gonorrhea: NEGATIVE

## 2020-03-18 ENCOUNTER — Encounter (HOSPITAL_COMMUNITY): Payer: Self-pay | Admitting: Family Medicine

## 2020-03-18 ENCOUNTER — Inpatient Hospital Stay (HOSPITAL_COMMUNITY)
Admission: AD | Admit: 2020-03-18 | Discharge: 2020-03-18 | Disposition: A | Discharge status: Home / Self Care | Payer: Medicaid Other | Attending: Family Medicine | Admitting: Family Medicine

## 2020-03-18 ENCOUNTER — Other Ambulatory Visit: Payer: Self-pay

## 2020-03-18 DIAGNOSIS — O4703 False labor before 37 completed weeks of gestation, third trimester: Secondary | ICD-10-CM | POA: Diagnosis present

## 2020-03-18 DIAGNOSIS — M9902 Segmental and somatic dysfunction of thoracic region: Secondary | ICD-10-CM

## 2020-03-18 DIAGNOSIS — F329 Major depressive disorder, single episode, unspecified: Secondary | ICD-10-CM | POA: Insufficient documentation

## 2020-03-18 DIAGNOSIS — O99513 Diseases of the respiratory system complicating pregnancy, third trimester: Secondary | ICD-10-CM | POA: Diagnosis not present

## 2020-03-18 DIAGNOSIS — O99343 Other mental disorders complicating pregnancy, third trimester: Secondary | ICD-10-CM | POA: Diagnosis not present

## 2020-03-18 DIAGNOSIS — O99891 Other specified diseases and conditions complicating pregnancy: Secondary | ICD-10-CM | POA: Diagnosis not present

## 2020-03-18 DIAGNOSIS — O26893 Other specified pregnancy related conditions, third trimester: Secondary | ICD-10-CM | POA: Insufficient documentation

## 2020-03-18 DIAGNOSIS — Z3A36 36 weeks gestation of pregnancy: Secondary | ICD-10-CM | POA: Diagnosis not present

## 2020-03-18 DIAGNOSIS — J45909 Unspecified asthma, uncomplicated: Secondary | ICD-10-CM | POA: Diagnosis not present

## 2020-03-18 DIAGNOSIS — F32A Depression, unspecified: Secondary | ICD-10-CM

## 2020-03-18 DIAGNOSIS — F419 Anxiety disorder, unspecified: Secondary | ICD-10-CM | POA: Diagnosis not present

## 2020-03-18 DIAGNOSIS — Z79899 Other long term (current) drug therapy: Secondary | ICD-10-CM | POA: Insufficient documentation

## 2020-03-18 DIAGNOSIS — O479 False labor, unspecified: Secondary | ICD-10-CM

## 2020-03-18 DIAGNOSIS — O471 False labor at or after 37 completed weeks of gestation: Secondary | ICD-10-CM

## 2020-03-18 DIAGNOSIS — Z3400 Encounter for supervision of normal first pregnancy, unspecified trimester: Secondary | ICD-10-CM

## 2020-03-18 DIAGNOSIS — O99340 Other mental disorders complicating pregnancy, unspecified trimester: Secondary | ICD-10-CM

## 2020-03-18 DIAGNOSIS — M546 Pain in thoracic spine: Secondary | ICD-10-CM

## 2020-03-18 HISTORY — DX: Anxiety disorder, unspecified: F41.9

## 2020-03-18 HISTORY — DX: Depression, unspecified: F32.A

## 2020-03-18 LAB — WET PREP, GENITAL
Clue Cells Wet Prep HPF POC: NONE SEEN
Sperm: NONE SEEN
Trich, Wet Prep: NONE SEEN
Yeast Wet Prep HPF POC: NONE SEEN

## 2020-03-18 NOTE — Discharge Instructions (Signed)
Fetal Movement Counts Patient Name: ________________________________________________ Patient Due Date: ____________________ What is a fetal movement count?  A fetal movement count is the number of times that you feel your baby move during a certain amount of time. This may also be called a fetal kick count. A fetal movement count is recommended for every pregnant woman. You may be asked to start counting fetal movements as early as week 28 of your pregnancy. Pay attention to when your baby is most active. You may notice your baby's sleep and wake cycles. You may also notice things that make your baby move more. You should do a fetal movement count:  When your baby is normally most active.  At the same time each day. A good time to count movements is while you are resting, after having something to eat and drink. How do I count fetal movements? 1. Find a quiet, comfortable area. Sit, or lie down on your side. 2. Write down the date, the start time and stop time, and the number of movements that you felt between those two times. Take this information with you to your health care visits. 3. Write down your start time when you feel the first movement. 4. Count kicks, flutters, swishes, rolls, and jabs. You should feel at least 10 movements. 5. You may stop counting after you have felt 10 movements, or if you have been counting for 2 hours. Write down the stop time. 6. If you do not feel 10 movements in 2 hours, contact your health care provider for further instructions. Your health care provider may want to do additional tests to assess your baby's well-being. Contact a health care provider if:  You feel fewer than 10 movements in 2 hours.  Your baby is not moving like he or she usually does. Date: ____________ Start time: ____________ Stop time: ____________ Movements: ____________ Date: ____________ Start time: ____________ Stop time: ____________ Movements: ____________ Date: ____________  Start time: ____________ Stop time: ____________ Movements: ____________ Date: ____________ Start time: ____________ Stop time: ____________ Movements: ____________ Date: ____________ Start time: ____________ Stop time: ____________ Movements: ____________ Date: ____________ Start time: ____________ Stop time: ____________ Movements: ____________ Date: ____________ Start time: ____________ Stop time: ____________ Movements: ____________ Date: ____________ Start time: ____________ Stop time: ____________ Movements: ____________ Date: ____________ Start time: ____________ Stop time: ____________ Movements: ____________ This information is not intended to replace advice given to you by your health care provider. Make sure you discuss any questions you have with your health care provider. Document Revised: 01/12/2019 Document Reviewed: 01/12/2019 Elsevier Patient Education  2020 [employer] of the uterus can occur throughout pregnancy, but they are not always a sign that you are in labor. You may have practice contractions called Braxton Hicks contractions. These false labor contractions are sometimes confused with true labor. What are [place] contractions? Braxton Hicks contractions are tightening movements that occur in the muscles of the uterus before labor. Unlike true labor contractions, these contractions do not result in opening (dilation) and thinning of the cervix. Toward the end of pregnancy (32-34 weeks), Braxton Hicks contractions can happen more often and may become stronger. These contractions are sometimes difficult to tell apart from true labor because they can be very uncomfortable. You should not feel embarrassed if you go to the hospital with false labor. Sometimes, the only way to tell if you are in true labor is for your health care provider to look for changes in the cervix. The health care provider  will do a physical exam and may  monitor your contractions. If you are not in true labor, the exam should show that your cervix is not dilating and your water has not broken. If there are no other health problems associated with your pregnancy, it is completely safe for you to be sent home with false labor. You may continue to have Braxton Hicks contractions until you go into true labor. How to tell the difference between true labor and false labor True labor  Contractions last 30-70 seconds.  Contractions become very regular.  Discomfort is usually felt in the top of the uterus, and it spreads to the lower abdomen and low back.  Contractions do not go away with walking.  Contractions usually become more intense and increase in frequency.  The cervix dilates and gets thinner. False labor  Contractions are usually shorter and not as strong as true labor contractions.  Contractions are usually irregular.  Contractions are often felt in the front of the lower abdomen and in the groin.  Contractions may go away when you walk around or change positions while lying down.  Contractions get weaker and are shorter-lasting as time goes on.  The cervix usually does not dilate or become thin. Follow these instructions at home:   Take over-the-counter and prescription medicines only as told by your health care provider.  Keep up with your usual exercises and follow other instructions from your health care provider.  Eat and drink lightly if you think you are going into labor.  If Braxton Hicks contractions are making you uncomfortable: ? Change your position from lying down or resting to walking, or change from walking to resting. ? Sit and rest in a tub of warm water. ? Drink enough fluid to keep your urine pale yellow. Dehydration may cause these contractions. ? Do slow and deep breathing several times an hour.  Keep all follow-up prenatal visits as told by your health care provider. This is important. Contact a  health care provider if:  You have a fever.  You have continuous pain in your abdomen. Get help right away if:  Your contractions become stronger, more regular, and closer together.  You have fluid leaking or gushing from your vagina.  You pass blood-tinged mucus (bloody show).  You have bleeding from your vagina.  You have low back pain that you never had before.  You feel your baby's head pushing down and causing pelvic pressure.  Your baby is not moving inside you as much as it used to. Summary  Contractions that occur before labor are called Braxton Hicks contractions, false labor, or practice contractions.  Braxton Hicks contractions are usually shorter, weaker, farther apart, and less regular than true labor contractions. True labor contractions usually become progressively stronger and regular, and they become more frequent.  Manage discomfort from Braxton Hicks contractions by changing position, resting in a warm bath, drinking plenty of water, or practicing deep breathing. This information is not intended to replace advice given to you by your health care provider. Make sure you discuss any questions you have with your health care provider. Document Revised: 05/07/2017 Document Reviewed: 10/08/2016 Elsevier Patient Education  2020 Elsevier Inc[employer]  

## 2020-03-18 NOTE — MAU Provider Note (Signed)
History     CSN: [id number]  Arrival date and time: 03/18/20 1440   First Provider Initiated Contact with Patient 03/18/20 1518        Chief Complaint  Patient presents with  . Contractions   HPI This is a 24 year old G1 at [redacted]w[redacted]d who presents with watery, milky discharge with mild odor for the past couple of days. Also started having contractions last night, now about every 6-7 minutes but rates as mild.   Continues to have mid thoracic back pain. Was seen in the office by me and had manipulation done with improvement. Pain started to return yesterday. Nonradiating.   OB History    Gravida  1   Para      Term      Preterm      AB      Living        SAB      TAB      Ectopic      Multiple      Live Births              Past Medical History:  Diagnosis Date  . Anxiety   . Asthma   . Back pain   . Depression   . Headache     Past Surgical History:  Procedure Laterality Date  . NO PAST SURGERIES      Family History  Problem Relation Age of Onset  . Migraines Mother     Social History   Tobacco Use  . Smoking status: Never Smoker  . Smokeless tobacco: Never Used  Vaping Use  . Vaping Use: Never used  Substance Use Topics  . Alcohol use: No    Alcohol/week: 0.0 standard drinks  . Drug use: No    Allergies: No Known Allergies  Medications Prior to Admission  Medication Sig Dispense Refill Last Dose  . albuterol (PROVENTIL HFA;VENTOLIN HFA) 108 (90 BASE) MCG/ACT inhaler Inhale 1-2 puffs into the lungs every 6 (six) hours as needed for wheezing or shortness of breath. 1 Inhaler 0 Past Week at Unknown time  . ferrous sulfate (FERROUSUL) 325 (65 FE) MG tablet Take 1 tablet (325 mg total) by mouth 2 (two) times daily. 60 tablet 1 Past Week at Unknown time  . ondansetron (ZOFRAN ODT) 4 MG disintegrating tablet Take 1 tablet (4 mg total) by mouth every 6 (six) hours as needed for nausea. 20 tablet 0 03/17/2020 at Unknown time  . pantoprazole  (PROTONIX) 20 MG tablet Take 1 tablet (20 mg total) by mouth daily. 30 tablet 0 03/17/2020 at Unknown time  . Prenatal Vit-Fe Fumarate-FA (PRENATAL VITAMIN) 27-0.8 MG TABS Take 1 tablet by mouth daily. 30 tablet 12 03/17/2020 at Unknown time  . scopolamine (TRANSDERM-SCOP) 1 MG/3DAYS Place 1 patch (1.5 mg total) onto the skin every 3 (three) days. 10 patch 1 03/17/2020 at Unknown time  . sertraline (ZOLOFT) 25 MG tablet Take 1 tablet (25 mg total) by mouth daily. 30 tablet 1 03/17/2020 at Unknown time  . Elastic Bandages & Supports (COMFORT FIT MATERNITY SUPP MED) MISC Wear belt during the day, as needed, and remove at night prior to bedtime. 1 each 0   . famotidine (PEPCID) 20 MG tablet Take 1 tablet (20 mg total) by mouth 2 (two) times daily. (Patient not taking: Reported on 02/13/2020) 30 tablet 0   . metoCLOPramide (REGLAN) 10 MG tablet Take 1 tablet (10 mg total) by mouth every 8 (eight) hours as needed for nausea. 30 tablet  1 More than a month at Unknown time  . terconazole (TERAZOL 7) 0.4 % vaginal cream Place 1 applicator vaginally at bedtime. Use for 7 days. 45 g 0     Review of Systems Physical Exam   Blood pressure 121/77, pulse 89, temperature 98 F (36.7 C), temperature source Oral, resp. rate 18, weight 75.6 kg, last menstrual period 07/05/2019, SpO2 100 %.  Physical Exam Vitals reviewed.  Constitutional:      Appearance: Normal appearance.  Cardiovascular:     Rate and Rhythm: Normal rate and regular rhythm.     Pulses: Normal pulses.  Pulmonary:     Effort: Pulmonary effort is normal.  Musculoskeletal:       Arms:     Comments: OSE: T7-8 FSRR  Skin:    Capillary Refill: Capillary refill takes less than 2 seconds.  Neurological:     General: No focal deficit present.     Mental Status: She is alert.  Psychiatric:        Mood and Affect: Mood normal.        Behavior: Behavior normal.        Thought Content: Thought content normal.        Judgment: Judgment normal.     Dilation: 1 Effacement (%): 60 Cervical Position: Posterior Presentation: Vertex Exam by:: Daneil Dan, RN  Results for orders placed or performed during the hospital encounter of 03/18/20 (from the past 24 hour(s))  Wet prep, genital     Status: Abnormal   Collection Time: 03/18/20  3:02 PM   Specimen: Vaginal; Genital  Result Value Ref Range   Yeast Wet Prep HPF POC NONE SEEN NONE SEEN   Trich, Wet Prep NONE SEEN NONE SEEN   Clue Cells Wet Prep HPF POC NONE SEEN NONE SEEN   WBC, Wet Prep HPF POC FEW (A) NONE SEEN   Sperm NONE SEEN      MAU Course  Procedures FHT: Baseline 135, mod variability, multiple accelerations, no decels.   OMT done after patient permission. HVLA technique utilized. 1 areas treated with improvement of tissue texture and joint mobility. Patient tolerated procedure well.    MDM   Assessment and Plan     ICD-10-CM   1. Acute midline thoracic back pain  M54.6   2. Depression affecting pregnancy, antepartum  O99.340    F32.A   3. Supervision of normal first pregnancy, antepartum  Z34.00   4. Somatic dysfunction of thoracic region  M99.02   5. [redacted] weeks gestation of pregnancy  Z3A.36   6. False labor  O47.9    Patient discharged to home with return precautions.  Truett Mainland 03/18/2020, 3:18 PM

## 2020-03-18 NOTE — MAU Note (Signed)
Pt started having CTX last night.  Now 6-7 min apart.  Rates pain 6-7/10.  Reports some milky white discharge with vaginal itching/burning.  No other c/o LOF or vaginal bleeding.  Reports good fetal movement.

## 2020-03-19 ENCOUNTER — Encounter: Payer: Medicaid Other | Admitting: Advanced Practice Midwife

## 2020-03-19 LAB — CULTURE, BETA STREP (GROUP B ONLY): Strep Gp B Culture: NEGATIVE

## 2020-03-21 ENCOUNTER — Other Ambulatory Visit: Payer: Self-pay

## 2020-03-21 ENCOUNTER — Ambulatory Visit (INDEPENDENT_AMBULATORY_CARE_PROVIDER_SITE_OTHER): Payer: Medicaid Other | Admitting: Family Medicine

## 2020-03-21 VITALS — BP 106/56 | HR 71 | Wt 171.0 lb

## 2020-03-21 DIAGNOSIS — O210 Mild hyperemesis gravidarum: Secondary | ICD-10-CM

## 2020-03-21 DIAGNOSIS — Z3400 Encounter for supervision of normal first pregnancy, unspecified trimester: Secondary | ICD-10-CM

## 2020-03-21 DIAGNOSIS — R87612 Low grade squamous intraepithelial lesion on cytologic smear of cervix (LGSIL): Secondary | ICD-10-CM

## 2020-03-21 DIAGNOSIS — Z3A37 37 weeks gestation of pregnancy: Secondary | ICD-10-CM

## 2020-03-21 MED ORDER — ONDANSETRON 4 MG PO TBDP: 4.0000 mg | ORAL_TABLET | Freq: Four times a day (QID) | ORAL | 2 refills | Status: DC | PRN

## 2020-03-21 MED ORDER — DIMENHYDRINATE 50 MG PO TABS: 50.0000 mg | ORAL_TABLET | Freq: Three times a day (TID) | ORAL | 2 refills | Status: DC | PRN

## 2020-03-21 MED ORDER — CLOTRIMAZOLE 1 % EX CREA: 1.0000 "application " | TOPICAL_CREAM | Freq: Two times a day (BID) | CUTANEOUS | 0 refills | Status: DC

## 2020-03-21 NOTE — Progress Notes (Signed)
   PRENATAL VISIT NOTE  Subjective:  Vicki Harper is a 24 y.o. G1P0 at [redacted]w[redacted]d being seen today for ongoing prenatal care.  She is currently monitored for the following issues for this low-risk pregnancy and has Chronic pain of left knee; Supervision of normal first pregnancy, antepartum; Hyperemesis gravidarum; LGSIL on Pap smear of cervix; Asthma affecting pregnancy in first trimester; Depression affecting pregnancy, antepartum; and Anemia affecting pregnancy on their problem list.  Patient reports no complaints.  Contractions: Irregular. Vag. Bleeding: None.  Movement: Present. Denies leaking of fluid.   The following portions of the patient's history were reviewed and updated as appropriate: allergies, current medications, past family history, past medical history, past social history, past surgical history and problem list.   Objective:   Vitals:   03/21/20 1405  BP: (!) 106/56  Pulse: 71  Weight: 171 lb (77.6 kg)    Fetal Status: Fetal Heart Rate (bpm): 131 Fundal Height: 37 cm Movement: Present  Presentation: Vertex  General:  Alert, oriented and cooperative. Patient is in no acute distress.  Skin: Skin is warm and dry. No rash noted.   Cardiovascular: Normal heart rate noted  Respiratory: Normal respiratory effort, no problems with respiration noted  Abdomen: Soft, gravid, appropriate for gestational age.  Pain/Pressure: Present     Pelvic: Cervical exam performed in the presence of a chaperone Dilation: Fingertip Effacement (%): Thick Station: -3  Extremities: Normal range of motion.  Edema: Trace  Mental Status: Normal mood and affect. Normal behavior. Normal judgment and thought content.   Assessment and Plan:  Pregnancy: G1P0 at [redacted]w[redacted]d 1. [redacted] weeks gestation of pregnancy 2. Supervision of normal first pregnancy, antepartum FHT and FH normal  3. Hyperemesis gravidarum Trial of dramamine as scopolamine patch causing skin irritation.  4. LGSIL on Pap smear of cervix [place]  postpartum   Term labor symptoms and general obstetric precautions including but not limited to vaginal bleeding, contractions, leaking of fluid and fetal movement were reviewed in detail with the patient. Please refer to After Visit Summary for other counseling recommendations.   Return in about 1 week (around 03/28/2020) for OB f/u.  Future Appointments  Date Time Provider [place]  03/28/2020  9:45 AM Lavonia Drafts, MD [facility] None  04/22/2020  1:15 PM [facility] [place], DO

## 2020-03-22 ENCOUNTER — Inpatient Hospital Stay (HOSPITAL_COMMUNITY)
Admission: AD | Admit: 2020-03-22 | Discharge: 2020-03-22 | Disposition: A | Discharge status: Home / Self Care | Payer: Medicaid Other | Attending: Obstetrics & Gynecology | Admitting: Obstetrics & Gynecology

## 2020-03-22 ENCOUNTER — Encounter (HOSPITAL_COMMUNITY): Payer: Self-pay | Admitting: Obstetrics & Gynecology

## 2020-03-22 DIAGNOSIS — O99353 Diseases of the nervous system complicating pregnancy, third trimester: Secondary | ICD-10-CM | POA: Diagnosis not present

## 2020-03-22 DIAGNOSIS — O1203 Gestational edema, third trimester: Secondary | ICD-10-CM | POA: Diagnosis not present

## 2020-03-22 DIAGNOSIS — R600 Localized edema: Secondary | ICD-10-CM

## 2020-03-22 DIAGNOSIS — Z3A37 37 weeks gestation of pregnancy: Secondary | ICD-10-CM

## 2020-03-22 DIAGNOSIS — R519 Headache, unspecified: Secondary | ICD-10-CM

## 2020-03-22 DIAGNOSIS — O26893 Other specified pregnancy related conditions, third trimester: Secondary | ICD-10-CM

## 2020-03-22 DIAGNOSIS — O99343 Other mental disorders complicating pregnancy, third trimester: Secondary | ICD-10-CM | POA: Diagnosis not present

## 2020-03-22 DIAGNOSIS — Z79899 Other long term (current) drug therapy: Secondary | ICD-10-CM | POA: Diagnosis not present

## 2020-03-22 DIAGNOSIS — O99513 Diseases of the respiratory system complicating pregnancy, third trimester: Secondary | ICD-10-CM | POA: Diagnosis not present

## 2020-03-22 DIAGNOSIS — J45909 Unspecified asthma, uncomplicated: Secondary | ICD-10-CM | POA: Diagnosis not present

## 2020-03-22 DIAGNOSIS — O471 False labor at or after 37 completed weeks of gestation: Secondary | ICD-10-CM | POA: Insufficient documentation

## 2020-03-22 DIAGNOSIS — F419 Anxiety disorder, unspecified: Secondary | ICD-10-CM | POA: Insufficient documentation

## 2020-03-22 DIAGNOSIS — O479 False labor, unspecified: Secondary | ICD-10-CM

## 2020-03-22 LAB — CBC
HCT: 28.4 % — ABNORMAL LOW (ref 36.0–46.0)
Hemoglobin: 8.5 g/dL — ABNORMAL LOW (ref 12.0–15.0)
MCH: 26.1 pg (ref 26.0–34.0)
MCHC: 29.9 g/dL — ABNORMAL LOW (ref 30.0–36.0)
MCV: 87.1 fL (ref 80.0–100.0)
Platelets: 238 10*3/uL (ref 150–400)
RBC: 3.26 MIL/uL — ABNORMAL LOW (ref 3.87–5.11)
RDW: 13.7 % (ref 11.5–15.5)
WBC: 7.3 10*3/uL (ref 4.0–10.5)
nRBC: 0 % (ref 0.0–0.2)

## 2020-03-22 LAB — PROTEIN / CREATININE RATIO, URINE
Creatinine, Urine: 197.76 mg/dL
Protein Creatinine Ratio: 0.28 mg/mg{Cre} — ABNORMAL HIGH (ref 0.00–0.15)
Total Protein, Urine: 55 mg/dL

## 2020-03-22 LAB — COMPREHENSIVE METABOLIC PANEL
ALT: 21 U/L (ref 0–44)
AST: 23 U/L (ref 15–41)
Albumin: 2.7 g/dL — ABNORMAL LOW (ref 3.5–5.0)
Alkaline Phosphatase: 92 U/L (ref 38–126)
Anion gap: 9 (ref 5–15)
BUN: 9 mg/dL (ref 6–20)
CO2: 19 mmol/L — ABNORMAL LOW (ref 22–32)
Calcium: 8.5 mg/dL — ABNORMAL LOW (ref 8.9–10.3)
Chloride: 105 mmol/L (ref 98–111)
Creatinine, Ser: 0.79 mg/dL (ref 0.44–1.00)
GFR, Estimated: >60 mL/min (ref >60)
Glucose, Bld: 80 mg/dL (ref 70–99)
Potassium: 3.6 mmol/L (ref 3.5–5.1)
Sodium: 133 mmol/L — ABNORMAL LOW (ref 135–145)
Total Bilirubin: 1 mg/dL (ref 0.3–1.2)
Total Protein: 5.9 g/dL — ABNORMAL LOW (ref 6.5–8.1)

## 2020-03-22 LAB — URINALYSIS, ROUTINE W REFLEX MICROSCOPIC
Bilirubin Urine: NEGATIVE
Glucose, UA: NEGATIVE mg/dL
Hgb urine dipstick: NEGATIVE
Ketones, ur: NEGATIVE mg/dL
Nitrite: NEGATIVE
Protein, ur: 30 mg/dL — AB
Specific Gravity, Urine: 1.023 (ref 1.005–1.030)
pH: 6 (ref 5.0–8.0)

## 2020-03-22 MED ORDER — PROMETHAZINE HCL 25 MG/ML IJ SOLN
25.0000 mg | Freq: Once | INTRAMUSCULAR | Status: AC
  Administered 2020-03-22: 25 mg via INTRAVENOUS
  Filled 2020-03-22: qty 1

## 2020-03-22 MED ORDER — DEXAMETHASONE SODIUM PHOSPHATE 10 MG/ML IJ SOLN
10.0000 mg | Freq: Once | INTRAMUSCULAR | Status: AC
  Administered 2020-03-22: 10 mg via INTRAVENOUS
  Filled 2020-03-22: qty 1

## 2020-03-22 MED ORDER — LACTATED RINGERS IV BOLUS
1000.0000 mL | Freq: Once | INTRAVENOUS | Status: AC
  Administered 2020-03-22: 1000 mL via INTRAVENOUS

## 2020-03-22 MED ORDER — DIPHENHYDRAMINE HCL 50 MG/ML IJ SOLN
25.0000 mg | Freq: Once | INTRAMUSCULAR | Status: AC
  Administered 2020-03-22: 25 mg via INTRAVENOUS
  Filled 2020-03-22: qty 1

## 2020-03-22 NOTE — Discharge Instructions (Signed)
Third Trimester of Pregnancy The third trimester is from week 28 through week 40 (months 7 through 9). The third trimester is a time when the unborn baby (fetus) is growing rapidly. At the end of the ninth month, the fetus is about 20 inches in length and weighs 6-10 pounds. Body changes during your third trimester Your body will continue to go through many changes during pregnancy. The changes vary from woman to woman. During the third trimester:  Your weight will continue to increase. You can expect to gain 25-35 pounds (11-16 kg) by the end of the pregnancy.  You may begin to get stretch marks on your hips, abdomen, and breasts.  You may urinate more often because the fetus is moving lower into your pelvis and pressing on your bladder.  You may develop or continue to have heartburn. This is caused by increased hormones that slow down muscles in the digestive tract.  You may develop or continue to have constipation because increased hormones slow digestion and cause the muscles that push waste through your intestines to relax.  You may develop hemorrhoids. These are swollen veins (varicose veins) in the rectum that can itch or be painful.  You may develop swollen, bulging veins (varicose veins) in your legs.  You may have increased body aches in the pelvis, back, or thighs. This is due to weight gain and increased hormones that are relaxing your joints.  You may have changes in your hair. These can include thickening of your hair, rapid growth, and changes in texture. Some women also have hair loss during or after pregnancy, or hair that feels dry or thin. Your hair will most likely return to normal after your baby is born.  Your breasts will continue to grow and they will continue to become tender. A yellow fluid (colostrum) may leak from your breasts. This is the first milk you are producing for your baby.  Your belly button may stick out.  You may notice more swelling in your hands,  face, or ankles.  You may have increased tingling or numbness in your hands, arms, and legs. The skin on your belly may also feel numb.  You may feel short of breath because of your expanding uterus.  You may have more problems sleeping. This can be caused by the size of your belly, increased need to urinate, and an increase in your body's metabolism.  You may notice the fetus "dropping," or moving lower in your abdomen (lightening).  You may have increased vaginal discharge.  You may notice your joints feel loose and you may have pain around your pelvic bone. What to expect at prenatal visits You will have prenatal exams every 2 weeks until week 36. Then you will have weekly prenatal exams. During a routine prenatal visit:  You will be weighed to make sure you and the baby are growing normally.  Your blood pressure will be taken.  Your abdomen will be measured to track your baby's growth.  The fetal heartbeat will be listened to.  Any test results from the previous visit will be discussed.  You may have a cervical check near your due date to see if your cervix has softened or thinned (effaced).  You will be tested for Group B streptococcus. This happens between 35 and 37 weeks. Your health care provider may ask you:  What your birth plan is.  How you are feeling.  If you are feeling the baby move.  If you have had any abnormal   symptoms, such as leaking fluid, bleeding, severe headaches, or abdominal cramping.  If you are using any tobacco products, including cigarettes, chewing tobacco, and electronic cigarettes.  If you have any questions. Other tests or screenings that may be performed during your third trimester include:  Blood tests that check for low iron levels (anemia).  Fetal testing to check the health, activity level, and growth of the fetus. Testing is done if you have certain medical conditions or if there are problems during the pregnancy.  Nonstress test  (NST). This test checks the health of your baby to make sure there are no signs of problems, such as the baby not getting enough oxygen. During this test, a belt is placed around your belly. The baby is made to move, and its heart rate is monitored during movement. What is false labor? False labor is a condition in which you feel small, irregular tightenings of the muscles in the womb (contractions) that usually go away with rest, changing position, or drinking water. These are called Braxton Hicks contractions. Contractions may last for hours, days, or even weeks before true labor sets in. If contractions come at regular intervals, become more frequent, increase in intensity, or become painful, you should see your health care provider. What are the signs of labor?  Abdominal cramps.  Regular contractions that start at 10 minutes apart and become stronger and more frequent with time.  Contractions that start on the top of the uterus and spread down to the lower abdomen and back.  Increased pelvic pressure and dull back pain.  A watery or bloody mucus discharge that comes from the vagina.  Leaking of amniotic fluid. This is also known as your "water breaking." It could be a slow trickle or a gush. Let your health care provider know if it has a color or strange odor. If you have any of these signs, call your health care provider right away, even if it is before your due date. Follow these instructions at home: Medicines  Follow your health care provider's instructions regarding medicine use. Specific medicines may be either safe or unsafe to take during pregnancy.  Take a prenatal vitamin that contains at least 600 micrograms (mcg) of folic acid.  If you develop constipation, try taking a stool softener if your health care provider approves. Eating and drinking   Eat a balanced diet that includes fresh fruits and vegetables, whole grains, good sources of protein such as meat, eggs, or tofu,  and low-fat dairy. Your health care provider will help you determine the amount of weight gain that is right for you.  Avoid raw meat and uncooked cheese. These carry germs that can cause birth defects in the baby.  If you have low calcium intake from food, talk to your health care provider about whether you should take a daily calcium supplement.  Eat four or five small meals rather than three large meals a day.  Limit foods that are high in fat and processed sugars, such as fried and sweet foods.  To prevent constipation: ? Drink enough fluid to keep your urine clear or pale yellow. ? Eat foods that are high in fiber, such as fresh fruits and vegetables, whole grains, and beans. Activity  Exercise only as directed by your health care provider. Most women can continue their usual exercise routine during pregnancy. Try to exercise for 30 minutes at least 5 days a week. Stop exercising if you experience uterine contractions.  Avoid heavy lifting.  Do   not exercise in extreme heat or humidity, or at high altitudes.  Wear low-heel, comfortable shoes.  Practice good posture.  You may continue to have sex unless your health care provider tells you otherwise. Relieving pain and discomfort  Take frequent breaks and rest with your legs elevated if you have leg cramps or low back pain.  Take warm sitz baths to soothe any pain or discomfort caused by hemorrhoids. Use hemorrhoid cream if your health care provider approves.  Wear a good support bra to prevent discomfort from breast tenderness.  If you develop varicose veins: ? Wear support pantyhose or compression stockings as told by your healthcare provider. ? Elevate your feet for 15 minutes, 3-4 times a day. Prenatal care  Write down your questions. Take them to your prenatal visits.  Keep all your prenatal visits as told by your health care provider. This is important. Safety  Wear your seat belt at all times when driving.  Make  a list of emergency phone numbers, including numbers for family, friends, the hospital, and police and fire departments. General instructions  Avoid cat litter boxes and soil used by cats. These carry germs that can cause birth defects in the baby. If you have a cat, ask someone to clean the litter box for you.  Do not travel far distances unless it is absolutely necessary and only with the approval of your health care provider.  Do not use hot tubs, steam rooms, or saunas.  Do not drink alcohol.  Do not use any products that contain nicotine or tobacco, such as cigarettes and e-cigarettes. If you need help quitting, ask your health care provider.  Do not use any medicinal herbs or unprescribed drugs. These chemicals affect the formation and growth of the baby.  Do not douche or use tampons or scented sanitary pads.  Do not cross your legs for long periods of time.  To prepare for the arrival of your baby: ? Take prenatal classes to understand, practice, and ask questions about labor and delivery. ? Make a trial run to the hospital. ? Visit the hospital and tour the maternity area. ? Arrange for maternity or paternity leave through employers. ? Arrange for family and friends to take care of pets while you are in the hospital. ? Purchase a rear-facing car seat and make sure you know how to install it in your car. ? Pack your hospital bag. ? Prepare the baby's nursery. Make sure to remove all pillows and stuffed animals from the baby's crib to prevent suffocation.  Visit your dentist if you have not gone during your pregnancy. Use a soft toothbrush to brush your teeth and be gentle when you floss. Contact a health care provider if:  You are unsure if you are in labor or if your water has broken.  You become dizzy.  You have mild pelvic cramps, pelvic pressure, or nagging pain in your abdominal area.  You have lower back pain.  You have persistent nausea, vomiting, or  diarrhea.  You have an unusual or bad smelling vaginal discharge.  You have pain when you urinate. Get help right away if:  Your water breaks before 37 weeks.  You have regular contractions less than 5 minutes apart before 37 weeks.  You have a fever.  You are leaking fluid from your vagina.  You have spotting or bleeding from your vagina.  You have severe abdominal pain or cramping.  You have rapid weight loss or weight gain.  You have   shortness of breath with chest pain.  You notice sudden or extreme swelling of your face, hands, ankles, feet, or legs.  Your baby makes fewer than 10 movements in 2 hours.  You have severe headaches that do not go away when you take medicine.  You have vision changes. Summary  The third trimester is from week 28 through week 40, months 7 through 9. The third trimester is a time when the unborn baby (fetus) is growing rapidly.  During the third trimester, your discomfort may increase as you and your baby continue to gain weight. You may have abdominal, leg, and back pain, sleeping problems, and an increased need to urinate.  During the third trimester your breasts will keep growing and they will continue to become tender. A yellow fluid (colostrum) may leak from your breasts. This is the first milk you are producing for your baby.  False labor is a condition in which you feel small, irregular tightenings of the muscles in the womb (contractions) that eventually go away. These are called Braxton Hicks contractions. Contractions may last for hours, days, or even weeks before true labor sets in.  Signs of labor can include: abdominal cramps; regular contractions that start at 10 minutes apart and become stronger and more frequent with time; watery or bloody mucus discharge that comes from the vagina; increased pelvic pressure and dull back pain; and leaking of amniotic fluid. This information is not intended to replace advice given to you by your  health care provider. Make sure you discuss any questions you have with your health care provider. Document Revised: 09/15/2018 Document Reviewed: 06/30/2016 Elsevier Patient Education  2020 [employer]  

## 2020-03-22 NOTE — MAU Provider Note (Signed)
History     CSN: [id number]  Arrival date and time: 03/22/20 9798   First Provider Initiated Contact with Patient 03/22/20 1923      Chief Complaint  Patient presents with  . Migraine  . Leg Swelling   Vicki Harper is a 24 y.o. G1P0 at [redacted]w[redacted]d who presents today with headache. She reports a history of frequent headaches.   Headache  This is a new problem. The current episode started yesterday. The problem occurs constantly. The problem has been gradually worsening. The pain is located in the frontal region. The pain does not radiate. The pain quality is similar to prior headaches. Quality: pressure. The pain is at a severity of 7/10. Associated symptoms include nausea. Pertinent negatives include no fever or vomiting. Nothing aggravates the symptoms. She has tried nothing for the symptoms.    OB History    Gravida  1   Para      Term      Preterm      AB      Living        SAB      TAB      Ectopic      Multiple      Live Births              Past Medical History:  Diagnosis Date  . Anxiety   . Asthma   . Back pain   . Depression   . Headache     Past Surgical History:  Procedure Laterality Date  . NO PAST SURGERIES      Family History  Problem Relation Age of Onset  . Migraines Mother     Social History   Tobacco Use  . Smoking status: Never Smoker  . Smokeless tobacco: Never Used  Vaping Use  . Vaping Use: Never used  Substance Use Topics  . Alcohol use: No    Alcohol/week: 0.0 standard drinks  . Drug use: No    Allergies: No Known Allergies  Medications Prior to Admission  Medication Sig Dispense Refill Last Dose  . albuterol (PROVENTIL HFA;VENTOLIN HFA) 108 (90 BASE) MCG/ACT inhaler Inhale 1-2 puffs into the lungs every 6 (six) hours as needed for wheezing or shortness of breath. 1 Inhaler 0 Past Month at Unknown time  . ferrous sulfate (FERROUSUL) 325 (65 FE) MG tablet Take 1 tablet (325 mg total) by mouth 2 (two) times daily.  60 tablet 1 Past Month at Unknown time  . metoCLOPramide (REGLAN) 10 MG tablet Take 1 tablet (10 mg total) by mouth every 8 (eight) hours as needed for nausea. 30 tablet 1 Past Month at Unknown time  . ondansetron (ZOFRAN ODT) 4 MG disintegrating tablet Take 1 tablet (4 mg total) by mouth every 6 (six) hours as needed for nausea. 40 tablet 2 03/22/2020 at 1630  . pantoprazole (PROTONIX) 20 MG tablet Take 1 tablet (20 mg total) by mouth daily. 30 tablet 0 03/21/2020 at Unknown time  . Prenatal Vit-Fe Fumarate-FA (PRENATAL VITAMIN) 27-0.8 MG TABS Take 1 tablet by mouth daily. 30 tablet 12 03/22/2020 at 0800  . sertraline (ZOLOFT) 25 MG tablet Take 1 tablet (25 mg total) by mouth daily. 30 tablet 1 03/22/2020 at 0800  . clotrimazole (LOTRIMIN) 1 % cream Apply 1 application topically 2 (two) times daily. 30 g 0   . dimenhyDRINATE (DRAMAMINE) 50 MG tablet Take 1 tablet (50 mg total) by mouth every 8 (eight) hours as needed. 30 tablet 2   . Elastic  Bandages & Supports (COMFORT FIT MATERNITY SUPP MED) MISC Wear belt during the day, as needed, and remove at night prior to bedtime. 1 each 0     Review of Systems  Constitutional: Negative for chills and fever.  Gastrointestinal: Positive for nausea. Negative for vomiting.  Genitourinary: Negative for dysuria, vaginal bleeding and vaginal discharge.  Neurological: Positive for headaches.   Physical Exam   Blood pressure 114/71, pulse 87, temperature 98.8 F (37.1 C), resp. rate 18, weight 77.6 kg, last menstrual period 07/05/2019, SpO2 100 %.  Physical Exam Vitals and nursing note reviewed.  Constitutional:      General: She is not in acute distress. HENT:     Head: Normocephalic.  Cardiovascular:     Rate and Rhythm: Normal rate.  Pulmonary:     Effort: Pulmonary effort is normal.  Abdominal:     Palpations: Abdomen is soft.     Tenderness: There is no abdominal tenderness.  Musculoskeletal:     Comments: Minimal pedal edema   Skin:     General: Skin is warm and dry.  Neurological:     Mental Status: She is alert and oriented to person, place, and time.  Psychiatric:        Mood and Affect: Mood normal.        Behavior: Behavior normal.    Cervix: FT external/Closed Internal/thick   NST:  Baseline: 130 Variability: moderate Accels: 15x15 Decels: none Toco: irregular Reactive/Appropriate for GA  MAU Course  Procedures  Results for orders placed or performed during the hospital encounter of 03/22/20 (from the past 24 hour(s))  Urinalysis, Routine w reflex microscopic Urine, Clean Catch     Status: Abnormal   Collection Time: 03/22/20  7:06 PM  Result Value Ref Range   Color, Urine YELLOW YELLOW   APPearance HAZY (A) CLEAR   Specific Gravity, Urine 1.023 1.005 - 1.030   pH 6.0 5.0 - 8.0   Glucose, UA NEGATIVE NEGATIVE mg/dL   Hgb urine dipstick NEGATIVE NEGATIVE   Bilirubin Urine NEGATIVE NEGATIVE   Ketones, ur NEGATIVE NEGATIVE mg/dL   Protein, ur 30 (A) NEGATIVE mg/dL   Nitrite NEGATIVE NEGATIVE   Leukocytes,Ua LARGE (A) NEGATIVE   RBC / HPF 0-5 0 - 5 RBC/hpf   WBC, UA 11-20 0 - 5 WBC/hpf   Bacteria, UA MANY (A) NONE SEEN   Squamous Epithelial / LPF 11-20 0 - 5   Mucus PRESENT   CBC     Status: Abnormal   Collection Time: 03/22/20  7:28 PM  Result Value Ref Range   WBC 7.3 4.0 - 10.5 K/uL   RBC 3.26 (L) 3.87 - 5.11 MIL/uL   Hemoglobin 8.5 (L) 12.0 - 15.0 g/dL   HCT 28.4 (L) 36 - 46 %   MCV 87.1 80.0 - 100.0 fL   MCH 26.1 26.0 - 34.0 pg   MCHC 29.9 (L) 30.0 - 36.0 g/dL   RDW 13.7 11.5 - 15.5 %   Platelets 238 150 - 400 K/uL   nRBC 0.0 0.0 - 0.2 %  Comprehensive metabolic panel     Status: Abnormal   Collection Time: 03/22/20  7:28 PM  Result Value Ref Range   Sodium 133 (L) 135 - 145 mmol/L   Potassium 3.6 3.5 - 5.1 mmol/L   Chloride 105 98 - 111 mmol/L   CO2 19 (L) 22 - 32 mmol/L   Glucose, Bld 80 70 - 99 mg/dL   BUN 9 6 - 20 mg/dL  Creatinine, Ser 0.79 0.44 - 1.00 mg/dL   Calcium 8.5  (L) 8.9 - 10.3 mg/dL   Total Protein 5.9 (L) 6.5 - 8.1 g/dL   Albumin 2.7 (L) 3.5 - 5.0 g/dL   AST 23 15 - 41 U/L   ALT 21 0 - 44 U/L   Alkaline Phosphatase 92 38 - 126 U/L   Total Bilirubin 1.0 0.3 - 1.2 mg/dL   GFR, Estimated >60 >60 mL/min   Anion gap 9 5 - 15  Protein / creatinine ratio, urine     Status: Abnormal   Collection Time: 03/22/20  7:29 PM  Result Value Ref Range   Creatinine, Urine 197.76 mg/dL   Total Protein, Urine 55 mg/dL   Protein Creatinine Ratio 0.28 (H) 0.00 - 0.15 mg/mg[Cre]     MDM 8:29 PM patient reports that headache is down from 7 to 3/10. However she now reports that she is having painful contractions. RN will check cervix.   Assessment and Plan   1. Pregnancy headache in third trimester   2. [redacted] weeks gestation of pregnancy   3. Pedal edema   4. Braxton Hicks contractions    DC home Comfort measures reviewed  3rd Trimester precautions  labor precautions  Fetal kick counts RX: none  Return to MAU as needed FU with OB as planned   [facility] Follow up.   Specialty: Obstetrics and Gynecology Contact information: [address] [place] [address] [place] DNP, CNM  03/22/20  8:39 PM

## 2020-03-22 NOTE — MAU Note (Signed)
.   Vicki Harper is a 24 y.o. at [redacted]w[redacted]d here in MAU reporting: migraine and legs swelling that start last night. Started having nausea this morning. Denies any VB or LOF  Onset of complaint: last night Pain score: 7 Vitals:   03/22/20 1852 03/22/20 1853  BP:  127/81  Pulse:  87  Resp:  18  Temp:  98.8 F (37.1 C)  SpO2: 100% 100%     FHT:132 Lab orders placed from triage: UA

## 2020-03-26 ENCOUNTER — Other Ambulatory Visit: Payer: Self-pay | Admitting: Family Medicine

## 2020-03-26 MED ORDER — CYCLOBENZAPRINE HCL 10 MG PO TABS: 5.0000 mg | ORAL_TABLET | Freq: Three times a day (TID) | ORAL | 2 refills | Status: DC | PRN

## 2020-03-27 ENCOUNTER — Encounter (HOSPITAL_COMMUNITY): Payer: Self-pay | Admitting: Obstetrics & Gynecology

## 2020-03-27 ENCOUNTER — Other Ambulatory Visit: Payer: Self-pay

## 2020-03-27 ENCOUNTER — Inpatient Hospital Stay (HOSPITAL_COMMUNITY)
Admission: AD | Admit: 2020-03-27 | Discharge: 2020-03-27 | Disposition: A | Discharge status: Home / Self Care | Payer: Medicaid Other | Attending: Obstetrics & Gynecology | Admitting: Obstetrics & Gynecology

## 2020-03-27 DIAGNOSIS — O212 Late vomiting of pregnancy: Secondary | ICD-10-CM | POA: Insufficient documentation

## 2020-03-27 DIAGNOSIS — O99353 Diseases of the nervous system complicating pregnancy, third trimester: Secondary | ICD-10-CM | POA: Insufficient documentation

## 2020-03-27 DIAGNOSIS — Z3689 Encounter for other specified antenatal screening: Secondary | ICD-10-CM | POA: Insufficient documentation

## 2020-03-27 DIAGNOSIS — J45909 Unspecified asthma, uncomplicated: Secondary | ICD-10-CM | POA: Diagnosis not present

## 2020-03-27 DIAGNOSIS — F329 Major depressive disorder, single episode, unspecified: Secondary | ICD-10-CM | POA: Insufficient documentation

## 2020-03-27 DIAGNOSIS — O99513 Diseases of the respiratory system complicating pregnancy, third trimester: Secondary | ICD-10-CM | POA: Insufficient documentation

## 2020-03-27 DIAGNOSIS — R519 Headache, unspecified: Secondary | ICD-10-CM

## 2020-03-27 DIAGNOSIS — Z3400 Encounter for supervision of normal first pregnancy, unspecified trimester: Secondary | ICD-10-CM

## 2020-03-27 DIAGNOSIS — O99013 Anemia complicating pregnancy, third trimester: Secondary | ICD-10-CM | POA: Diagnosis not present

## 2020-03-27 DIAGNOSIS — Z79899 Other long term (current) drug therapy: Secondary | ICD-10-CM | POA: Insufficient documentation

## 2020-03-27 DIAGNOSIS — Z3A38 38 weeks gestation of pregnancy: Secondary | ICD-10-CM | POA: Diagnosis not present

## 2020-03-27 DIAGNOSIS — O99891 Other specified diseases and conditions complicating pregnancy: Secondary | ICD-10-CM

## 2020-03-27 DIAGNOSIS — F419 Anxiety disorder, unspecified: Secondary | ICD-10-CM | POA: Insufficient documentation

## 2020-03-27 DIAGNOSIS — D509 Iron deficiency anemia, unspecified: Secondary | ICD-10-CM | POA: Insufficient documentation

## 2020-03-27 DIAGNOSIS — O99340 Other mental disorders complicating pregnancy, unspecified trimester: Secondary | ICD-10-CM

## 2020-03-27 DIAGNOSIS — O99343 Other mental disorders complicating pregnancy, third trimester: Secondary | ICD-10-CM | POA: Diagnosis not present

## 2020-03-27 DIAGNOSIS — G43009 Migraine without aura, not intractable, without status migrainosus: Secondary | ICD-10-CM | POA: Diagnosis not present

## 2020-03-27 DIAGNOSIS — O219 Vomiting of pregnancy, unspecified: Secondary | ICD-10-CM

## 2020-03-27 DIAGNOSIS — F32A Depression, unspecified: Secondary | ICD-10-CM

## 2020-03-27 LAB — URINALYSIS, ROUTINE W REFLEX MICROSCOPIC
Glucose, UA: NEGATIVE mg/dL
Hgb urine dipstick: NEGATIVE
Ketones, ur: 5 mg/dL — AB
Nitrite: NEGATIVE
Protein, ur: 100 mg/dL — AB
Specific Gravity, Urine: 1.029 (ref 1.005–1.030)
pH: 5 (ref 5.0–8.0)

## 2020-03-27 LAB — COMPREHENSIVE METABOLIC PANEL
ALT: 25 U/L (ref 0–44)
AST: 22 U/L (ref 15–41)
Albumin: 2.8 g/dL — ABNORMAL LOW (ref 3.5–5.0)
Alkaline Phosphatase: 106 U/L (ref 38–126)
Anion gap: 6 (ref 5–15)
BUN: 7 mg/dL (ref 6–20)
CO2: 22 mmol/L (ref 22–32)
Calcium: 8.6 mg/dL — ABNORMAL LOW (ref 8.9–10.3)
Chloride: 108 mmol/L (ref 98–111)
Creatinine, Ser: 0.82 mg/dL (ref 0.44–1.00)
GFR, Estimated: >60 mL/min (ref >60)
Glucose, Bld: 79 mg/dL (ref 70–99)
Potassium: 3.6 mmol/L (ref 3.5–5.1)
Sodium: 136 mmol/L (ref 135–145)
Total Bilirubin: 1.1 mg/dL (ref 0.3–1.2)
Total Protein: 6 g/dL — ABNORMAL LOW (ref 6.5–8.1)

## 2020-03-27 LAB — CBC
HCT: 28.2 % — ABNORMAL LOW (ref 36.0–46.0)
Hemoglobin: 8.5 g/dL — ABNORMAL LOW (ref 12.0–15.0)
MCH: 25.6 pg — ABNORMAL LOW (ref 26.0–34.0)
MCHC: 30.1 g/dL (ref 30.0–36.0)
MCV: 84.9 fL (ref 80.0–100.0)
Platelets: 259 10*3/uL (ref 150–400)
RBC: 3.32 MIL/uL — ABNORMAL LOW (ref 3.87–5.11)
RDW: 13.6 % (ref 11.5–15.5)
WBC: 8.1 10*3/uL (ref 4.0–10.5)
nRBC: 0 % (ref 0.0–0.2)

## 2020-03-27 LAB — PROTEIN / CREATININE RATIO, URINE
Creatinine, Urine: 521.48 mg/dL
Protein Creatinine Ratio: 0.18 mg/mg{Cre} — ABNORMAL HIGH (ref 0.00–0.15)
Total Protein, Urine: 92 mg/dL

## 2020-03-27 MED ORDER — BUTALBITAL-APAP-CAFFEINE 50-325-40 MG PO TABS
2.0000 | ORAL_TABLET | Freq: Once | ORAL | Status: AC
  Administered 2020-03-27: 2 via ORAL
  Filled 2020-03-27: qty 2

## 2020-03-27 MED ORDER — DIMENHYDRINATE 50 MG PO TABS
50.0000 mg | ORAL_TABLET | Freq: Once | ORAL | Status: AC
  Administered 2020-03-27: 50 mg via ORAL
  Filled 2020-03-27: qty 1

## 2020-03-27 MED ORDER — SODIUM CHLORIDE 0.9 % IV SOLN
510.0000 mg | Freq: Once | INTRAVENOUS | Status: AC
  Administered 2020-03-27: 510 mg via INTRAVENOUS
  Filled 2020-03-27: qty 17

## 2020-03-27 MED ORDER — BUTALBITAL-APAP-CAFFEINE 50-325-40 MG PO TABS: 1.0000 | ORAL_TABLET | Freq: Four times a day (QID) | ORAL | 0 refills | Status: DC | PRN

## 2020-03-27 MED ORDER — LACTATED RINGERS IV BOLUS
1000.0000 mL | Freq: Once | INTRAVENOUS | Status: AC
  Administered 2020-03-27: 1000 mL via INTRAVENOUS

## 2020-03-27 NOTE — MAU Note (Signed)
Ongoing HA for a wk, spoke with MD again yesterday, so came back in.  Is able to eat, just eventually comes back up. Light sensitive.

## 2020-03-27 NOTE — MAU Provider Note (Addendum)
History     CSN: [id number]  Arrival date and time: 03/27/20 1452   First Provider Initiated Contact with Patient 03/27/20 1641      Chief Complaint  Patient presents with  . Headache  . Emesis   Bernard Donahoo 24 y.o. Female G1P0 [redacted]w[redacted]d with past medical history of Hyperemesis gravidarum, anxiety, depression, LGSIL on Pap smear of cervix who presented to the MAU with nausea vomiting and migraine headache. Patient was seen last at the MAU on 03/22/20 for similar symptoms and was discharged with dramamine and lotrimin cream but was unable to pick it up at the pharmacy due to insurance/ prescription being over the counter. The symptom has not resolved since 03/22/20 and has been continuing.     OB History    Gravida  1   Para      Term      Preterm      AB      Living        SAB      TAB      Ectopic      Multiple      Live Births              Past Medical History:  Diagnosis Date  . Anxiety   . Asthma   . Back pain   . Depression   . Headache     Past Surgical History:  Procedure Laterality Date  . NO PAST SURGERIES      Family History  Problem Relation Age of Onset  . Migraines Mother     Social History   Tobacco Use  . Smoking status: Never Smoker  . Smokeless tobacco: Never Used  Vaping Use  . Vaping Use: Never used  Substance Use Topics  . Alcohol use: No    Alcohol/week: 0.0 standard drinks  . Drug use: No    Allergies: No Known Allergies  Medications Prior to Admission  Medication Sig Dispense Refill Last Dose  . acetaminophen (TYLENOL) 500 MG tablet Take 500 mg by mouth every 6 (six) hours as needed for headache.   03/26/2020 at Unknown time  . albuterol (PROVENTIL HFA;VENTOLIN HFA) 108 (90 BASE) MCG/ACT inhaler Inhale 1-2 puffs into the lungs every 6 (six) hours as needed for wheezing or shortness of breath. 1 Inhaler 0 03/26/2020 at Unknown time  . clotrimazole (LOTRIMIN) 1 % cream Apply 1 application topically 2 (two) times  daily. 30 g 0 Past Week at Unknown time  . ondansetron (ZOFRAN ODT) 4 MG disintegrating tablet Take 1 tablet (4 mg total) by mouth every 6 (six) hours as needed for nausea. 40 tablet 2 03/27/2020 at Unknown time  . Prenatal Vit-Fe Fumarate-FA (PRENATAL VITAMIN) 27-0.8 MG TABS Take 1 tablet by mouth daily. 30 tablet 12 03/26/2020 at Unknown time  . cyclobenzaprine (FLEXERIL) 10 MG tablet Take 0.5-1 tablets (5-10 mg total) by mouth 3 (three) times daily as needed (Headache). 30 tablet 2  at not taking  . dimenhyDRINATE (DRAMAMINE) 50 MG tablet Take 1 tablet (50 mg total) by mouth every 8 (eight) hours as needed. 30 tablet 2  at not taking   . Elastic Bandages & Supports (COMFORT FIT MATERNITY SUPP MED) MISC Wear belt during the day, as needed, and remove at night prior to bedtime. 1 each 0   . ferrous sulfate (FERROUSUL) 325 (65 FE) MG tablet Take 1 tablet (325 mg total) by mouth 2 (two) times daily. 60 tablet 1   . metoCLOPramide (REGLAN) 10  MG tablet Take 1 tablet (10 mg total) by mouth every 8 (eight) hours as needed for nausea. 30 tablet 1  at not taking   . pantoprazole (PROTONIX) 20 MG tablet Take 1 tablet (20 mg total) by mouth daily. 30 tablet 0 Unknown at Unknown time  . sertraline (ZOLOFT) 25 MG tablet Take 1 tablet (25 mg total) by mouth daily. 30 tablet 1  at no taking     Review of Systems  Constitutional: Negative.  Negative for chills.  HENT: Negative.   Eyes: Negative.   Respiratory: Negative.   Cardiovascular: Negative.   Gastrointestinal: Positive for nausea and vomiting. Negative for diarrhea.  Endocrine: Negative.   Genitourinary: Negative.   Musculoskeletal: Negative.   Skin: Negative.   Allergic/Immunologic: Negative.   Neurological: Negative.   Hematological: Negative.   Psychiatric/Behavioral: Negative.    Physical Exam   Blood pressure 114/69, pulse 77, temperature 98.5 F (36.9 C), temperature source Oral, resp. rate 16, height 5\' 6"  (1.676 m), weight 75.2 kg,  last menstrual period 07/05/2019, SpO2 100 %.  Physical Exam Vitals reviewed.  Constitutional:      Appearance: She is well-developed.  HENT:     Head: Normocephalic and atraumatic.  Cardiovascular:     Rate and Rhythm: Normal rate and regular rhythm.     Heart sounds: No murmur heard.  No friction rub. No gallop.   Pulmonary:     Effort: Pulmonary effort is normal. No respiratory distress.     Breath sounds: Normal breath sounds. No stridor. No wheezing, rhonchi or rales.  Chest:     Chest wall: No tenderness.  Abdominal:     General: Bowel sounds are normal. There is no distension.     Palpations: Abdomen is soft. There is no mass.     Tenderness: There is no abdominal tenderness. There is no guarding.  Musculoskeletal:        General: Normal range of motion.     Cervical back: Normal range of motion and neck supple.  Skin:    General: Skin is warm.  Neurological:     Mental Status: She is alert and oriented to person, place, and time.  Psychiatric:        Mood and Affect: Affect is tearful.     MAU Course  Procedures Results for orders placed or performed during the hospital encounter of 03/27/20 (from the past 24 hour(s))  Protein / creatinine ratio, urine     Status: Abnormal   Collection Time: 03/27/20  3:39 PM  Result Value Ref Range   Creatinine, Urine 521.48 mg/dL   Total Protein, Urine 92 mg/dL   Protein Creatinine Ratio 0.18 (H) 0.00 - 0.15 mg/mg[Cre]  Urinalysis, Routine w reflex microscopic Urine, Clean Catch     Status: Abnormal   Collection Time: 03/27/20  3:39 PM  Result Value Ref Range   Color, Urine AMBER (A) YELLOW   APPearance HAZY (A) CLEAR   Specific Gravity, Urine 1.029 1.005 - 1.030   pH 5.0 5.0 - 8.0   Glucose, UA NEGATIVE NEGATIVE mg/dL   Hgb urine dipstick NEGATIVE NEGATIVE   Bilirubin Urine SMALL (A) NEGATIVE   Ketones, ur 5 (A) NEGATIVE mg/dL   Protein, ur 100 (A) NEGATIVE mg/dL   Nitrite NEGATIVE NEGATIVE   Leukocytes,Ua MODERATE  (A) NEGATIVE   RBC / HPF 0-5 0 - 5 RBC/hpf   WBC, UA 0-5 0 - 5 WBC/hpf   Bacteria, UA FEW (A) NONE SEEN   Squamous Epithelial /  LPF 6-10 0 - 5   Mucus PRESENT   CBC     Status: Abnormal   Collection Time: 03/27/20  4:03 PM  Result Value Ref Range   WBC 8.1 4.0 - 10.5 K/uL   RBC 3.32 (L) 3.87 - 5.11 MIL/uL   Hemoglobin 8.5 (L) 12.0 - 15.0 g/dL   HCT 28.2 (L) 36 - 46 %   MCV 84.9 80.0 - 100.0 fL   MCH 25.6 (L) 26.0 - 34.0 pg   MCHC 30.1 30.0 - 36.0 g/dL   RDW 13.6 11.5 - 15.5 %   Platelets 259 150 - 400 K/uL   nRBC 0.0 0.0 - 0.2 %  Comprehensive metabolic panel     Status: Abnormal   Collection Time: 03/27/20  4:03 PM  Result Value Ref Range   Sodium 136 135 - 145 mmol/L   Potassium 3.6 3.5 - 5.1 mmol/L   Chloride 108 98 - 111 mmol/L   CO2 22 22 - 32 mmol/L   Glucose, Bld 79 70 - 99 mg/dL   BUN 7 6 - 20 mg/dL   Creatinine, Ser 0.82 0.44 - 1.00 mg/dL   Calcium 8.6 (L) 8.9 - 10.3 mg/dL   Total Protein 6.0 (L) 6.5 - 8.1 g/dL   Albumin 2.8 (L) 3.5 - 5.0 g/dL   AST 22 15 - 41 U/L   ALT 25 0 - 44 U/L   Alkaline Phosphatase 106 38 - 126 U/L   Total Bilirubin 1.1 0.3 - 1.2 mg/dL   GFR, Estimated >60 >60 mL/min   Anion gap 6 5 - 15    MDM  Nausea/Vomiting - start dramamine  - continue scopolamine patch  - urinalysis to rule out infection -CBC check for leukocytosis  - CMP for electrolyte imbalance - LR bolus   Iron deficiency  - If needed, start iron infusion at AMU - infrequently taking iron   Headache -start on headache cocktail Fioricet  LGSIL on Pap smear of cervix Colpo postpartum    Assessment and Plan   Nausea/Vomiting - start dramamine  - continue scopolamine patch prn - continue metoclopramide prn - continue ondansetrone prn - continue promethazine prn  Iron deficiency  - continue iron tablet  Headache - continue to monitor symptoms.   Depression/Anxiety - continue sertraline  Discharge Summary -Discharge home in stable condition -Rx for  dramamine  -proteinuria in last 10/15 visit. Proteinuria not seen today precautions discussed -Patient advised to follow-up with PCP -Patient may return to MAU as needed or if her condition were to change or worsen  Sul Gi Kim 03/27/2020, 4:59 PM   Attestation of Supervision of Student:  I confirm that I have verified the information documented in the medical student's note and that I have also personally reperformed the history, physical exam and all medical decision making activities.  I have verified that all services and findings are accurately documented in this student's note; and I agree with management and plan as outlined in the documentation. I have also made any necessary editorial changes.  Patient reports her headache started early this morning and was a 7-8/10.  She states she took tylenol around 1030 and her headache improved to 4/10.  She reports eating a bagel this morning, but has been unable to keep food down and threw up 4x yesterday.  She states she is able to drink water and eat ice chips without issues.  Patient denies current or recent vomiting, but reports feelings of nausea.  -POC reviewed -PreEclampsia labs  collected for persistent HA and patient with labs ~5 days ago and PC Ratio high normal at .28. CBC and CMP returned, PC Ratio pending.  -HgB noted at 8.5L and discussed feraheme infusion if labs return normal -Will give fioricet for HA.   [name], CNM   Reassessment (6:30 PM)  -PCR returns normal range at 0.18 -Will order feraheme infusion and LR bolus. -Patient reports relief with dramamine dosing.  -Will return and reassess.   Reassessment (7:27 PM)  -Patient reports resolution of HA with fioricet dosing. -S/P Feraheme dosing. -Patient agreeable to script. -Patient instructed to follow up as scheduled tomorrow with Dr. Rip Harbour -Patient without questions or concerns. -Encouraged to call or return to MAU if symptoms worsen or with the onset of new  symptoms. -Discharged to home in improved condition.  [name] MSN, CNM Advanced Practice Provider, [facility]

## 2020-03-27 NOTE — MAU Note (Signed)
Corrected creatinine results called by the lab. Emly CNM made aware of corrected results.

## 2020-03-27 NOTE — Discharge Instructions (Signed)

## 2020-03-28 ENCOUNTER — Ambulatory Visit (INDEPENDENT_AMBULATORY_CARE_PROVIDER_SITE_OTHER): Payer: Medicaid Other | Admitting: Obstetrics & Gynecology

## 2020-03-28 ENCOUNTER — Encounter: Payer: Self-pay | Admitting: Obstetrics & Gynecology

## 2020-03-28 VITALS — BP 115/65 | HR 73 | Wt 169.0 lb

## 2020-03-28 DIAGNOSIS — O99511 Diseases of the respiratory system complicating pregnancy, first trimester: Secondary | ICD-10-CM

## 2020-03-28 DIAGNOSIS — J45909 Unspecified asthma, uncomplicated: Secondary | ICD-10-CM

## 2020-03-28 DIAGNOSIS — R87612 Low grade squamous intraepithelial lesion on cytologic smear of cervix (LGSIL): Secondary | ICD-10-CM

## 2020-03-28 DIAGNOSIS — R519 Headache, unspecified: Secondary | ICD-10-CM

## 2020-03-28 DIAGNOSIS — Z3400 Encounter for supervision of normal first pregnancy, unspecified trimester: Secondary | ICD-10-CM

## 2020-03-28 DIAGNOSIS — O99340 Other mental disorders complicating pregnancy, unspecified trimester: Secondary | ICD-10-CM

## 2020-03-28 DIAGNOSIS — O26899 Other specified pregnancy related conditions, unspecified trimester: Secondary | ICD-10-CM

## 2020-03-28 DIAGNOSIS — Z3A38 38 weeks gestation of pregnancy: Secondary | ICD-10-CM

## 2020-03-28 DIAGNOSIS — O99013 Anemia complicating pregnancy, third trimester: Secondary | ICD-10-CM

## 2020-03-28 DIAGNOSIS — F32A Depression, unspecified: Secondary | ICD-10-CM

## 2020-03-28 MED ORDER — BUTALBITAL-APAP-CAFFEINE 50-325-40 MG PO TABS: 1.0000 | ORAL_TABLET | Freq: Four times a day (QID) | ORAL | 0 refills | Status: DC | PRN

## 2020-03-28 NOTE — Progress Notes (Signed)
   PRENATAL VISIT NOTE  Subjective:  Vicki Harper is a 24 y.o. G1P0 at [redacted]w[redacted]d being seen today for ongoing prenatal care.  She is currently monitored for the following issues for this low-risk pregnancy and has Chronic pain of left knee; Supervision of normal first pregnancy, antepartum; Hyperemesis gravidarum; LGSIL on Pap smear of cervix; Asthma affecting pregnancy in first trimester; Depression affecting pregnancy, antepartum; and Anemia affecting pregnancy on their problem list.  Patient reports headache.Was seen in the MAU and was given a Rx for Fioricet. Has not taken prescription to the pharmacy yet.   Contractions: Irregular. Vag. Bleeding: None.  Movement: Present. Denies leaking of fluid.   The following portions of the patient's history were reviewed and updated as appropriate: allergies, current medications, past family history, past medical history, past social history, past surgical history and problem list.   Objective:   Vitals:   03/28/20 0954  BP: 115/65  Pulse: 73  Weight: 169 lb (76.7 kg)    Fetal Status: Fetal Heart Rate (bpm): 130   Movement: Present     General:  Alert, oriented and cooperative. Patient is in no acute distress.  Skin: Skin is warm and dry. No rash noted.   Cardiovascular: Normal heart rate noted  Respiratory: Normal respiratory effort, no problems with respiration noted  Abdomen: Soft, gravid, appropriate for gestational age.  Pain/Pressure: Present     Pelvic: Cervical exam performed in the presence of a chaperone        Extremities: Normal range of motion.  Edema: Trace  Mental Status: Normal mood and affect. Normal behavior. Normal judgment and thought content.   Assessment and Plan:  Pregnancy: G1P0 at [redacted]w[redacted]d 1. [redacted] weeks gestation of pregnancy FH and FHR WNL  2. Supervision of normal first pregnancy, antepartum  3. Anemia affecting pregnancy in third trimester  4. Depression affecting pregnancy, antepartum  5. Asthma affecting  pregnancy in first trimester  6. LGSIL on Pap smear of cervix  7. HA in pregnancy Sent the Rx for Fioricet to the pharmacy. (took back prev Rx).   Term labor symptoms and general obstetric precautions including but not limited to vaginal bleeding, contractions, leaking of fluid and fetal movement were reviewed in detail with the patient. Please refer to After Visit Summary for other counseling recommendations.   Return in about 1 week (around 04/04/2020).  Future Appointments  Date Time Provider [place]  04/22/2020  1:15 PM [facility]    Lavonia Drafts, MD

## 2020-03-29 LAB — URINE CULTURE

## 2020-04-04 ENCOUNTER — Ambulatory Visit (INDEPENDENT_AMBULATORY_CARE_PROVIDER_SITE_OTHER): Payer: Medicaid Other | Admitting: Family Medicine

## 2020-04-04 ENCOUNTER — Other Ambulatory Visit: Payer: Self-pay

## 2020-04-04 ENCOUNTER — Other Ambulatory Visit (HOSPITAL_COMMUNITY)
Admission: RE | Admit: 2020-04-04 | Discharge: 2020-04-04 | Disposition: A | Discharge status: Home / Self Care | Payer: Medicaid Other | Source: Ambulatory Visit | Attending: Family Medicine | Admitting: Family Medicine

## 2020-04-04 VITALS — BP 122/74 | HR 91 | Wt 173.0 lb

## 2020-04-04 DIAGNOSIS — O26893 Other specified pregnancy related conditions, third trimester: Secondary | ICD-10-CM

## 2020-04-04 DIAGNOSIS — N898 Other specified noninflammatory disorders of vagina: Secondary | ICD-10-CM | POA: Insufficient documentation

## 2020-04-04 DIAGNOSIS — Z3400 Encounter for supervision of normal first pregnancy, unspecified trimester: Secondary | ICD-10-CM

## 2020-04-04 DIAGNOSIS — O99013 Anemia complicating pregnancy, third trimester: Secondary | ICD-10-CM

## 2020-04-04 DIAGNOSIS — F32A Depression, unspecified: Secondary | ICD-10-CM

## 2020-04-04 DIAGNOSIS — O99340 Other mental disorders complicating pregnancy, unspecified trimester: Secondary | ICD-10-CM

## 2020-04-04 NOTE — Progress Notes (Signed)
   PRENATAL VISIT NOTE  Subjective:  Vicki Harper is a 24 y.o. G1P0 at [redacted]w[redacted]d being seen today for ongoing prenatal care.  She is currently monitored for the following issues for this low-risk pregnancy and has Chronic pain of left knee; Supervision of normal first pregnancy, antepartum; Hyperemesis gravidarum; LGSIL on Pap smear of cervix; Asthma affecting pregnancy in first trimester; Depression affecting pregnancy, antepartum; and Anemia affecting pregnancy on their problem list.  Patient reports occasional contractions.  Contractions: Irregular. Vag. Bleeding: None.  Movement: Present. Denies leaking of fluid.   The following portions of the patient's history were reviewed and updated as appropriate: allergies, current medications, past family history, past medical history, past social history, past surgical history and problem list.   Objective:   Vitals:   04/04/20 1421  BP: 122/74  Pulse: 91  Weight: 173 lb (78.5 kg)    Fetal Status: Fetal Heart Rate (bpm): 130 Fundal Height: 39 cm Movement: Present  Presentation: Vertex  General:  Alert, oriented and cooperative. Patient is in no acute distress.  Skin: Skin is warm and dry. No rash noted.   Cardiovascular: Normal heart rate noted  Respiratory: Normal respiratory effort, no problems with respiration noted  Abdomen: Soft, gravid, appropriate for gestational age.  Pain/Pressure: Present     Pelvic: Cervical exam performed in the presence of a chaperone Dilation: 1 Effacement (%): 70 Station: -3  Extremities: Normal range of motion.  Edema: Trace  Mental Status: Normal mood and affect. Normal behavior. Normal judgment and thought content.   Assessment and Plan:  Pregnancy: G1P0 at [redacted]w[redacted]d 1. Anemia affecting pregnancy in third trimester S/p feraheme  2. Depression affecting pregnancy, antepartum  3. Supervision of normal first pregnancy, antepartum FHT and FH normal. BPP  Term labor symptoms and general obstetric precautions  including but not limited to vaginal bleeding, contractions, leaking of fluid and fetal movement were reviewed in detail with the patient. Please refer to After Visit Summary for other counseling recommendations.   No follow-ups on file.  Future Appointments  Date Time Provider [place]  04/08/2020  3:30 PM [facility] NURSE [facility]  04/08/2020  3:45 PM [facility]  04/11/2020  9:45 AM Truett Mainland, DO [facility] None  04/22/2020  1:15 PM [place], DO

## 2020-04-04 NOTE — Addendum Note (Signed)
Addended by: Phill Myron on: 04/04/2020 03:48 PM   Modules accepted: Orders

## 2020-04-08 ENCOUNTER — Ambulatory Visit: Payer: Medicaid Other

## 2020-04-08 LAB — CERVICOVAGINAL ANCILLARY ONLY
Bacterial Vaginitis (gardnerella): POSITIVE — AB
Candida Glabrata: NEGATIVE
Candida Vaginitis: NEGATIVE
Comment: NEGATIVE
Comment: NEGATIVE
Comment: NEGATIVE

## 2020-04-08 MED ORDER — METRONIDAZOLE 500 MG PO TABS: 500.0000 mg | ORAL_TABLET | Freq: Two times a day (BID) | ORAL | 0 refills | Status: DC

## 2020-04-08 NOTE — Addendum Note (Signed)
Addended by: Truett Mainland on: 04/08/2020 03:18 PM   Modules accepted: Orders

## 2020-04-11 ENCOUNTER — Encounter (HOSPITAL_COMMUNITY): Payer: Self-pay | Admitting: Obstetrics & Gynecology

## 2020-04-11 ENCOUNTER — Other Ambulatory Visit: Payer: Self-pay

## 2020-04-11 ENCOUNTER — Inpatient Hospital Stay (EMERGENCY_DEPARTMENT_HOSPITAL)
Admission: AD | Admit: 2020-04-11 | Discharge: 2020-04-11 | Disposition: A | Discharge status: Home / Self Care | Payer: Medicaid Other | Source: Home / Self Care | Attending: Obstetrics and Gynecology | Admitting: Obstetrics and Gynecology

## 2020-04-11 ENCOUNTER — Encounter (HOSPITAL_COMMUNITY): Payer: Self-pay | Admitting: Obstetrics and Gynecology

## 2020-04-11 ENCOUNTER — Ambulatory Visit: Payer: Medicaid Other | Admitting: *Deleted

## 2020-04-11 ENCOUNTER — Encounter: Payer: Self-pay | Admitting: *Deleted

## 2020-04-11 ENCOUNTER — Ambulatory Visit (HOSPITAL_BASED_OUTPATIENT_CLINIC_OR_DEPARTMENT_OTHER): Payer: Medicaid Other

## 2020-04-11 ENCOUNTER — Ambulatory Visit (INDEPENDENT_AMBULATORY_CARE_PROVIDER_SITE_OTHER): Payer: Medicaid Other | Admitting: Family Medicine

## 2020-04-11 ENCOUNTER — Inpatient Hospital Stay (EMERGENCY_DEPARTMENT_HOSPITAL)
Admission: AD | Admit: 2020-04-11 | Discharge: 2020-04-12 | Disposition: A | Discharge status: Home / Self Care | Payer: Medicaid Other | Source: Home / Self Care | Attending: Obstetrics & Gynecology | Admitting: Obstetrics & Gynecology

## 2020-04-11 DIAGNOSIS — O471 False labor at or after 37 completed weeks of gestation: Secondary | ICD-10-CM | POA: Insufficient documentation

## 2020-04-11 DIAGNOSIS — F32A Depression, unspecified: Secondary | ICD-10-CM

## 2020-04-11 DIAGNOSIS — O480 Post-term pregnancy: Secondary | ICD-10-CM | POA: Insufficient documentation

## 2020-04-11 DIAGNOSIS — Z3400 Encounter for supervision of normal first pregnancy, unspecified trimester: Secondary | ICD-10-CM | POA: Diagnosis not present

## 2020-04-11 DIAGNOSIS — Z3A40 40 weeks gestation of pregnancy: Secondary | ICD-10-CM | POA: Insufficient documentation

## 2020-04-11 DIAGNOSIS — O99340 Other mental disorders complicating pregnancy, unspecified trimester: Secondary | ICD-10-CM

## 2020-04-11 DIAGNOSIS — O99019 Anemia complicating pregnancy, unspecified trimester: Secondary | ICD-10-CM

## 2020-04-11 DIAGNOSIS — O479 False labor, unspecified: Secondary | ICD-10-CM

## 2020-04-11 MED ORDER — MORPHINE SULFATE (PF) 10 MG/ML IV SOLN
10.0000 mg | Freq: Once | INTRAVENOUS | Status: AC
  Administered 2020-04-11: 10 mg via INTRAMUSCULAR
  Filled 2020-04-11: qty 1

## 2020-04-11 MED ORDER — BUTORPHANOL TARTRATE 1 MG/ML IJ SOLN
2.0000 mg | INTRAMUSCULAR | Status: AC
  Administered 2020-04-11: 2 mg via INTRAMUSCULAR
  Filled 2020-04-11: qty 2

## 2020-04-11 MED ORDER — BUTORPHANOL TARTRATE 1 MG/ML IJ SOLN
2.0000 mg | INTRAMUSCULAR | Status: DC
Start: 2020-04-11 — End: 2020-04-11

## 2020-04-11 NOTE — Progress Notes (Signed)
   PRENATAL VISIT NOTE  Subjective:  Vicki Harper is a 24 y.o. G1P0 at [redacted]w[redacted]d being seen today for ongoing prenatal care.  She is currently monitored for the following issues for this low-risk pregnancy and has Chronic pain of left knee; Supervision of normal first pregnancy, antepartum; Hyperemesis gravidarum; LGSIL on Pap smear of cervix; Asthma affecting pregnancy in first trimester; Depression affecting pregnancy, antepartum; and Anemia affecting pregnancy on their problem list.  Patient reports contractions since last night. Spaced out some this morning, but increasing again.  Contractions: Regular. Vag. Bleeding: None.  Movement: Present. Denies leaking of fluid.   The following portions of the patient's history were reviewed and updated as appropriate: allergies, current medications, past family history, past medical history, past social history, past surgical history and problem list.   Objective:   Vitals:   04/11/20 0954  BP: 118/68  Pulse: 74  Weight: 169 lb (76.7 kg)    Fetal Status: Fetal Heart Rate (bpm): 125 Fundal Height: 40 cm Movement: Present  Presentation: Vertex  General:  Alert, oriented and cooperative. Patient is in no acute distress.  Skin: Skin is warm and dry. No rash noted.   Cardiovascular: Normal heart rate noted  Respiratory: Normal respiratory effort, no problems with respiration noted  Abdomen: Soft, gravid, appropriate for gestational age.  Pain/Pressure: Present     Pelvic: Cervical exam performed in the presence of a chaperone Dilation: 1.5 Effacement (%): 80 Station: -2  Extremities: Normal range of motion.  Edema: Trace  Mental Status: Normal mood and affect. Normal behavior. Normal judgment and thought content.   Assessment and Plan:  Pregnancy: G1P0 at [redacted]w[redacted]d 1. Anemia affecting pregnancy, antepartum  2. Depression affecting pregnancy, antepartum  3. Supervision of normal first pregnancy, antepartum Early labor. Patient to walk, go to the  hospital if increasing. Will schedule induction for early next week if not delivered.  Term labor symptoms and general obstetric precautions including but not limited to vaginal bleeding, contractions, leaking of fluid and fetal movement were reviewed in detail with the patient. Please refer to After Visit Summary for other counseling recommendations.   No follow-ups on file.  Future Appointments  Date Time Provider [place]  04/22/2020  1:15 PM [place], DO

## 2020-04-11 NOTE — Progress Notes (Signed)
Patient complaining of contraction 6-10 mins apart all evening. Kathrene Alu RN

## 2020-04-11 NOTE — MAU Provider Note (Signed)
   MAU Labor Check  S: Ms. Necole Minassian is a 24 y.o. G1P0 at [redacted]w[redacted]d  who presents to MAU today complaining contractions q 3 minutes since 2pm. She denies vaginal bleeding. She denies LOF. She reports normal fetal movement.    O: BP 118/83 (BP Location: Left Arm)   Pulse 68   Temp 98.5 F (36.9 C) (Oral)   Resp 16   LMP 07/05/2019   SpO2 100%  GENERAL: Well-developed, well-nourished female in no acute distress.  HEAD: Normocephalic, atraumatic.  CHEST: Normal effort of breathing, regular heart rate ABDOMEN: Soft, nontender, gravid  Cervical exam:  Dilation: 1.5 Effacement (%): 90 Cervical Position: Middle Station: -2 Presentation: Vertex Exam by:: Elray Mcgregor, RN   Fetal Monitoring: Baseline: 120 Variability: moderate Accelerations: present Decelerations: absent Contractions: 2-14min    A: SIUP at [redacted]w[redacted]d  False labor  P: Monitored for 3 hours on MAU without cervical change. Given stadol IM x1 and monitored. Plan for d/c home with strict labor return precautions.   Janet Berlin, MD 04/11/2020 5:00 PM

## 2020-04-11 NOTE — MAU Note (Signed)
Pt returns to MAU for labor check. Was here earlier and made minimal change. States pain has not gotten any better. Has bloody show, denies LOF, +FM.

## 2020-04-11 NOTE — MAU Note (Signed)
Pt reports ctx's since 46. Pt reports being 1.5 cm in the office today.   Reports bloody show, Denies LOF  Reports +FM

## 2020-04-12 ENCOUNTER — Inpatient Hospital Stay (HOSPITAL_COMMUNITY): Payer: Medicaid Other | Admitting: Anesthesiology

## 2020-04-12 ENCOUNTER — Inpatient Hospital Stay (HOSPITAL_COMMUNITY)
Admission: AD | Admit: 2020-04-12 | Discharge: 2020-04-15 | DRG: 805 | Disposition: A | Discharge status: Home / Self Care | Payer: Medicaid Other | Attending: Obstetrics & Gynecology | Admitting: Obstetrics & Gynecology

## 2020-04-12 ENCOUNTER — Other Ambulatory Visit: Payer: Self-pay | Admitting: Family Medicine

## 2020-04-12 ENCOUNTER — Other Ambulatory Visit: Payer: Self-pay

## 2020-04-12 ENCOUNTER — Encounter (HOSPITAL_COMMUNITY): Payer: Self-pay | Admitting: Obstetrics and Gynecology

## 2020-04-12 DIAGNOSIS — D649 Anemia, unspecified: Secondary | ICD-10-CM | POA: Diagnosis present

## 2020-04-12 DIAGNOSIS — Z3490 Encounter for supervision of normal pregnancy, unspecified, unspecified trimester: Secondary | ICD-10-CM | POA: Diagnosis present

## 2020-04-12 DIAGNOSIS — Z20822 Contact with and (suspected) exposure to covid-19: Secondary | ICD-10-CM | POA: Diagnosis present

## 2020-04-12 DIAGNOSIS — O99344 Other mental disorders complicating childbirth: Secondary | ICD-10-CM | POA: Diagnosis present

## 2020-04-12 DIAGNOSIS — O26893 Other specified pregnancy related conditions, third trimester: Secondary | ICD-10-CM | POA: Diagnosis present

## 2020-04-12 DIAGNOSIS — J45909 Unspecified asthma, uncomplicated: Secondary | ICD-10-CM | POA: Diagnosis present

## 2020-04-12 DIAGNOSIS — O471 False labor at or after 37 completed weeks of gestation: Secondary | ICD-10-CM

## 2020-04-12 DIAGNOSIS — O9902 Anemia complicating childbirth: Secondary | ICD-10-CM | POA: Diagnosis present

## 2020-04-12 DIAGNOSIS — Z79899 Other long term (current) drug therapy: Secondary | ICD-10-CM

## 2020-04-12 DIAGNOSIS — O99019 Anemia complicating pregnancy, unspecified trimester: Secondary | ICD-10-CM

## 2020-04-12 DIAGNOSIS — O9952 Diseases of the respiratory system complicating childbirth: Secondary | ICD-10-CM | POA: Diagnosis present

## 2020-04-12 DIAGNOSIS — Z3A40 40 weeks gestation of pregnancy: Secondary | ICD-10-CM

## 2020-04-12 DIAGNOSIS — O99340 Other mental disorders complicating pregnancy, unspecified trimester: Secondary | ICD-10-CM

## 2020-04-12 DIAGNOSIS — O904 Postpartum acute kidney failure: Secondary | ICD-10-CM | POA: Diagnosis not present

## 2020-04-12 DIAGNOSIS — O99892 Other specified diseases and conditions complicating childbirth: Secondary | ICD-10-CM | POA: Diagnosis not present

## 2020-04-12 DIAGNOSIS — N179 Acute kidney failure, unspecified: Secondary | ICD-10-CM | POA: Diagnosis not present

## 2020-04-12 DIAGNOSIS — Z3400 Encounter for supervision of normal first pregnancy, unspecified trimester: Secondary | ICD-10-CM

## 2020-04-12 DIAGNOSIS — F32A Depression, unspecified: Secondary | ICD-10-CM | POA: Diagnosis present

## 2020-04-12 LAB — CBC
HCT: 35.6 % — ABNORMAL LOW (ref 36.0–46.0)
Hemoglobin: 10.8 g/dL — ABNORMAL LOW (ref 12.0–15.0)
MCH: 27.8 pg (ref 26.0–34.0)
MCHC: 30.3 g/dL (ref 30.0–36.0)
MCV: 91.5 fL (ref 80.0–100.0)
Platelets: 254 10*3/uL (ref 150–400)
RBC: 3.89 MIL/uL (ref 3.87–5.11)
RDW: 20.1 % — ABNORMAL HIGH (ref 11.5–15.5)
WBC: 12.3 10*3/uL — ABNORMAL HIGH (ref 4.0–10.5)
nRBC: 0 % (ref 0.0–0.2)

## 2020-04-12 LAB — RESPIRATORY PANEL BY RT PCR (FLU A&B, COVID)
Influenza A by PCR: NEGATIVE
Influenza B by PCR: NEGATIVE
SARS Coronavirus 2 by RT PCR: NEGATIVE

## 2020-04-12 LAB — TYPE AND SCREEN
ABO/RH(D): B POS
Antibody Screen: NEGATIVE

## 2020-04-12 MED ORDER — ACETAMINOPHEN 325 MG PO TABS: 650.0000 mg | ORAL_TABLET | ORAL | Status: DC | PRN

## 2020-04-12 MED ORDER — PHENYLEPHRINE 40 MCG/ML (10ML) SYRINGE FOR IV PUSH (FOR BLOOD PRESSURE SUPPORT)
80.0000 ug | PREFILLED_SYRINGE | INTRAVENOUS | Status: DC | PRN
  Filled 2020-04-12: qty 10

## 2020-04-12 MED ORDER — OXYTOCIN BOLUS FROM INFUSION
333.0000 mL | Freq: Once | INTRAVENOUS | Status: AC
  Administered 2020-04-13: 333 mL via INTRAVENOUS

## 2020-04-12 MED ORDER — LACTATED RINGERS IV SOLN: 500.0000 mL | Freq: Once | INTRAVENOUS | Status: DC

## 2020-04-12 MED ORDER — EPHEDRINE 5 MG/ML INJ: 10.0000 mg | INTRAVENOUS | Status: DC | PRN

## 2020-04-12 MED ORDER — LACTATED RINGERS IV SOLN: INTRAVENOUS | Status: DC

## 2020-04-12 MED ORDER — OXYTOCIN-SODIUM CHLORIDE 30-0.9 UT/500ML-% IV SOLN: 2.5000 [IU]/h | INTRAVENOUS | Status: DC

## 2020-04-12 MED ORDER — SOD CITRATE-CITRIC ACID 500-334 MG/5ML PO SOLN: 30.0000 mL | ORAL | Status: DC | PRN

## 2020-04-12 MED ORDER — LIDOCAINE HCL (PF) 1 % IJ SOLN
INTRAMUSCULAR | Status: DC | PRN
  Administered 2020-04-12: 11 mL via EPIDURAL

## 2020-04-12 MED ORDER — TERBUTALINE SULFATE 1 MG/ML IJ SOLN
0.2500 mg | Freq: Once | INTRAMUSCULAR | Status: AC | PRN
  Administered 2020-04-13: 0.25 mg via SUBCUTANEOUS
  Filled 2020-04-12 (×2): qty 1

## 2020-04-12 MED ORDER — OXYCODONE-ACETAMINOPHEN 5-325 MG PO TABS: 2.0000 | ORAL_TABLET | ORAL | Status: DC | PRN

## 2020-04-12 MED ORDER — SERTRALINE HCL 25 MG PO TABS
25.0000 mg | ORAL_TABLET | Freq: Every day | ORAL | Status: DC
  Filled 2020-04-12: qty 1

## 2020-04-12 MED ORDER — LACTATED RINGERS IV SOLN: 500.0000 mL | INTRAVENOUS | Status: DC | PRN

## 2020-04-12 MED ORDER — OXYCODONE-ACETAMINOPHEN 5-325 MG PO TABS: 1.0000 | ORAL_TABLET | ORAL | Status: DC | PRN

## 2020-04-12 MED ORDER — LACTATED RINGERS IV SOLN
500.0000 mL | INTRAVENOUS | Status: DC | PRN
  Administered 2020-04-12 (×2): 1000 mL via INTRAVENOUS
  Administered 2020-04-12 – 2020-04-13 (×3): 500 mL via INTRAVENOUS
  Administered 2020-04-13: 1000 mL via INTRAVENOUS

## 2020-04-12 MED ORDER — LIDOCAINE HCL (PF) 1 % IJ SOLN: 30.0000 mL | INTRAMUSCULAR | Status: DC | PRN

## 2020-04-12 MED ORDER — SODIUM CHLORIDE (PF) 0.9 % IJ SOLN
INTRAMUSCULAR | Status: DC | PRN
  Administered 2020-04-12: 12 mL/h via EPIDURAL

## 2020-04-12 MED ORDER — OXYTOCIN BOLUS FROM INFUSION: 333.0000 mL | Freq: Once | INTRAVENOUS | Status: DC

## 2020-04-12 MED ORDER — DIPHENHYDRAMINE HCL 50 MG/ML IJ SOLN: 12.5000 mg | INTRAMUSCULAR | Status: DC | PRN

## 2020-04-12 MED ORDER — ONDANSETRON HCL 4 MG/2ML IJ SOLN
4.0000 mg | Freq: Four times a day (QID) | INTRAMUSCULAR | Status: DC | PRN
  Administered 2020-04-12: 4 mg via INTRAVENOUS
  Filled 2020-04-12: qty 2

## 2020-04-12 MED ORDER — FENTANYL-BUPIVACAINE-NACL 0.5-0.125-0.9 MG/250ML-% EP SOLN
12.0000 mL/h | EPIDURAL | Status: DC | PRN
  Administered 2020-04-13: 12 mL/h via EPIDURAL
  Filled 2020-04-12 (×3): qty 250

## 2020-04-12 MED ORDER — OXYTOCIN-SODIUM CHLORIDE 30-0.9 UT/500ML-% IV SOLN
1.0000 m[IU]/min | INTRAVENOUS | Status: DC
  Administered 2020-04-13: 1 m[IU]/min via INTRAVENOUS
  Filled 2020-04-12: qty 500

## 2020-04-12 MED ORDER — PHENYLEPHRINE 40 MCG/ML (10ML) SYRINGE FOR IV PUSH (FOR BLOOD PRESSURE SUPPORT)
80.0000 ug | PREFILLED_SYRINGE | INTRAVENOUS | Status: DC | PRN
  Administered 2020-04-12: 80 ug via INTRAVENOUS

## 2020-04-12 MED ORDER — ONDANSETRON HCL 4 MG/2ML IJ SOLN: 4.0000 mg | Freq: Four times a day (QID) | INTRAMUSCULAR | Status: DC | PRN

## 2020-04-12 NOTE — Progress Notes (Addendum)
Labor Progress Note Shantice Menger is a 24 y.o. G1P0 at [redacted]w[redacted]d presented for SOL.   S: Feeling more pressure, but overall comfortable with epidural. No complaints.   O:  BP 119/64   Pulse (!) 139   Temp 99.3 F (37.4 C) (Oral)   Resp 18   Ht 5\' 6"  (1.676 m)   Wt 77 kg   LMP 07/05/2019   SpO2 100%   BMI 27.41 kg/m  EFM: baseline 130 bpm / moderate variability / accels present, some early decels, one late decel at 1716. Overall reassuring strip.   CVE: Dilation: 4.5 Effacement (%): 90 Cervical Position: Anterior Station: -3 Presentation: Undeterminable (BBOW) Exam by:: Rzhang,rnc-ob/dr Jeani Hawking   A&P: 24 y.o. G1P0 [redacted]w[redacted]d presented for SOL.  #Labor: Progressing well without augmentation. #Pain: Epidural #FWB: Cat 1 overall reassuring, despite some early decels, one late decel at 1716 #GBS negative #Depression on zoloft: will continue zoloft, plan for postpartum SW consult #Anemic on iron: Admission Hgb 10.8 #PMHx asthma: will avoid hemabate  Thompson Caul MD PGY-1    Patient with recurrent late decelerations, moderate variability in between. Cat II tracing. Rechecked and patient is 3.5/90/Ballotable. Confirmed cephalic on [place]. Patient repositioned and given IVF bolus. Noted to have minimal UOP and reports minimal PO intake over past few days. BP 100/50, phenylephrine given. Strip overall improved at this time. Will continue to monitor closely. Dr. Rosana Hoes updated.   Janet Berlin, MD 7:21 PM

## 2020-04-12 NOTE — Progress Notes (Signed)
Labor Progress Note Vicki Harper is a 24 y.o. G1P0 at [redacted]w[redacted]d presented for SOL.   S: Pt endorsing vaginal pressure with contractions. No concerns at this time.  O:  BP 127/67   Pulse 92   Temp 99.1 F (37.3 C) (Oral)   Resp 18   Ht 5\' 6"  (1.676 m)   Wt 77 kg   LMP 07/05/2019   SpO2 100%   BMI 27.41 kg/m  EFM: baseline 125 bpm / moderate variability / accels present, no recent decels  CVE: Dilation: 4.5 Effacement (%): 90 Cervical Position: Anterior Station: -1 Presentation: Vertex Exam by:: Denorris Reust   A&P: 24 y.o. G1P0 [redacted]w[redacted]d presented for SOL.  #Labor: Progressing well without augmentation. S/p period of late decels but FHT now improved s/p maternal repositioning and IVF bolus. Will continue to monitor closely and defer augmentation if pt continues to progress well. #Pain: Epidural in place #FWB: Cat 1 strip #GBS negative #Depression on zoloft: will continue zoloft, plan for postpartum SW consult #Anemic on iron: Admission Hgb 10.8 #PMHx asthma: will avoid hemabate  Randa Ngo, MD OB Fellow, Faculty Practice 04/12/2020 9:45 PM

## 2020-04-12 NOTE — MAU Note (Signed)
Pt reports contractions that have continued for 2 days. This is her 3rd visit to MAU in 24 hours. Was discharged last night after having Morphine for therapeutic rest which did help with pain and contractions. She reports no LOF, some bloody show and +FM. Was 2 cm/80% on last SVE.  Pt reports pain 10/10

## 2020-04-12 NOTE — Anesthesia Procedure Notes (Signed)
Epidural Patient location during procedure: OB Start time: 04/12/2020 3:19 PM End time: 04/12/2020 3:33 PM  Staffing Anesthesiologist: Lynda Rainwater, MD Performed: anesthesiologist   Preanesthetic Checklist Completed: patient identified, IV checked, site marked, risks and benefits discussed, surgical consent, monitors and equipment checked, pre-op evaluation and timeout performed  Epidural Patient position: sitting Prep: ChloraPrep Patient monitoring: heart rate, cardiac monitor, continuous pulse ox and blood pressure Approach: midline Location: L2-L3 Injection technique: LOR saline  Needle:  Needle type: Tuohy  Needle gauge: 17 G Needle length: 9 cm Needle insertion depth: 6 cm Catheter type: closed end flexible Catheter size: 20 Guage Catheter at skin depth: 10 cm Test dose: negative  Assessment Events: blood not aspirated, injection not painful, no injection resistance, no paresthesia and negative IV test  Additional Notes Reason for block:procedure for pain

## 2020-04-12 NOTE — H&P (Signed)
OBSTETRIC ADMISSION HISTORY AND PHYSICAL  Vicki Harper is a 24 y.o. female G1P0 with IUP at [redacted]w[redacted]d by LMP presenting for labor. She was seen twice yesterday in the MAU for painful contractions, however made limited change despite monitoring. She represented today and is now 4cm.   She reports +FMs, No LOF, no VB, no blurry vision, headaches or peripheral edema, and RUQ pain.  She plans on breast feeding. She is undecided on birth control. She received her prenatal care at [facility]   Dating: By 9wk [place] --->  Estimated Date of Delivery: 04/10/20  Sono:    @[redacted]w[redacted]d , CWD, normal anatomy, cephalic presentation,  Posterior placenta, 3618g, 48% EFW   Prenatal History/Complications:  -Hx of depression, on zoloft 25 mg  -Hx of asthma  -Hx of anemia on PO iron    Past Medical History: Past Medical History:  Diagnosis Date  . Anxiety   . Asthma   . Back pain   . Depression   . Headache     Past Surgical History: Past Surgical History:  Procedure Laterality Date  . NO PAST SURGERIES      Obstetrical History: OB History    Gravida  1   Para      Term      Preterm      AB      Living        SAB      TAB      Ectopic      Multiple      Live Births              Social History Social History   Socioeconomic History  . Marital status: Single    Spouse name: Not on file  . Number of children: Not on file  . Years of education: Not on file  . Highest education level: Not on file  Occupational History  . Not on file  Tobacco Use  . Smoking status: Never Smoker  . Smokeless tobacco: Never Used  Vaping Use  . Vaping Use: Never used  Substance and Sexual Activity  . Alcohol use: No    Alcohol/week: 0.0 standard drinks  . Drug use: No  . Sexual activity: Yes    Birth control/protection: None  Other Topics Concern  . Not on file  Social History Narrative  . Not on file   Social Determinants of Health   Financial Resource Strain:   . Difficulty of Paying  Living Expenses: Not on file  Food Insecurity:   . Worried About Charity fundraiser in the Last Year: Not on file  . Ran Out of Food in the Last Year: Not on file  Transportation Needs:   . Lack of Transportation (Medical): Not on file  . Lack of Transportation (Non-Medical): Not on file  Physical Activity:   . Days of Exercise per Week: Not on file  . Minutes of Exercise per Session: Not on file  Stress:   . Feeling of Stress : Not on file  Social Connections:   . Frequency of Communication with Friends and Family: Not on file  . Frequency of Social Gatherings with Friends and Family: Not on file  . Attends Religious Services: Not on file  . Active Member of Clubs or Organizations: Not on file  . Attends Archivist Meetings: Not on file  . Marital Status: Not on file    Family History: Family History  Problem Relation Age of Onset  . Migraines Mother  Allergies: No Known Allergies  Medications Prior to Admission  Medication Sig Dispense Refill Last Dose  . acetaminophen (TYLENOL) 500 MG tablet Take 500 mg by mouth every 6 (six) hours as needed for headache.     . albuterol (PROVENTIL HFA;VENTOLIN HFA) 108 (90 BASE) MCG/ACT inhaler Inhale 1-2 puffs into the lungs every 6 (six) hours as needed for wheezing or shortness of breath. 1 Inhaler 0   . butalbital-acetaminophen-caffeine (FIORICET) 50-325-40 MG tablet Take 1-2 tablets by mouth every 6 (six) hours as needed for headache. 20 tablet 0  at not taking  . clotrimazole (LOTRIMIN) 1 % cream Apply 1 application topically 2 (two) times daily. (Patient not taking: Reported on 04/11/2020) 30 g 0   . cyclobenzaprine (FLEXERIL) 10 MG tablet Take 0.5-1 tablets (5-10 mg total) by mouth 3 (three) times daily as needed (Headache). (Patient not taking: Reported on 04/11/2020) 30 tablet 2   . dimenhyDRINATE (DRAMAMINE) 50 MG tablet Take 1 tablet (50 mg total) by mouth every 8 (eight) hours as needed. (Patient not taking: Reported  on 04/11/2020) 30 tablet 2   . Elastic Bandages & Supports (COMFORT FIT MATERNITY SUPP MED) MISC Wear belt during the day, as needed, and remove at night prior to bedtime. 1 each 0   . ferrous sulfate (FERROUSUL) 325 (65 FE) MG tablet Take 1 tablet (325 mg total) by mouth 2 (two) times daily. 60 tablet 1   . metoCLOPramide (REGLAN) 10 MG tablet Take 1 tablet (10 mg total) by mouth every 8 (eight) hours as needed for nausea. 30 tablet 1   . metroNIDAZOLE (FLAGYL) 500 MG tablet Take 1 tablet (500 mg total) by mouth 2 (two) times daily. 14 tablet 0   . ondansetron (ZOFRAN ODT) 4 MG disintegrating tablet Take 1 tablet (4 mg total) by mouth every 6 (six) hours as needed for nausea. 40 tablet 2   . pantoprazole (PROTONIX) 20 MG tablet Take 1 tablet (20 mg total) by mouth daily. 30 tablet 0   . Prenatal Vit-Fe Fumarate-FA (PRENATAL VITAMIN) 27-0.8 MG TABS Take 1 tablet by mouth daily. 30 tablet 12   . sertraline (ZOLOFT) 25 MG tablet Take 1 tablet (25 mg total) by mouth daily. 30 tablet 1      Review of Systems   All systems reviewed and negative except as stated in HPI  Blood pressure 120/66, pulse 77, temperature 98.7 F (37.1 C), temperature source Oral, resp. rate 17, last menstrual period 07/05/2019, SpO2 100 %. General appearance: alert, cooperative and appears stated age Lungs: clear to auscultation bilaterally Heart: regular rate and rhythm Abdomen: soft, non-tender; bowel sounds normal Extremities: Homans sign is negative, no sign of DVT Presentation: cephalic Fetal monitoring: baseline 120, mod variability, pos accels, neg decels  Uterine activity: q3-4 min  Dilation: 4 Effacement (%): 90 Station: -3 Exam by:: [email or web address],cnm   Prenatal labs: ABO, Rh: --/--/PENDING (11/05 1439) Antibody: PENDING (11/05 1439) Rubella: 3.16 (04/06 1047) RPR: Non Reactive (08/24 0941)  HBsAg: Negative (04/06 1047)  HIV: Non Reactive (08/24 0941)  GBS: Negative/-- (10/08 0910)  1 hr Glucola  normal Genetic screening  normal Anatomy US normal   Prenatal Transfer Tool  Maternal Diabetes: No Genetic Screening: Normal Maternal Ultrasounds/Referrals: Normal Fetal Ultrasounds or other Referrals:  None Maternal Substance Abuse:  No Significant Maternal Medications:  zoloft  Significant Maternal Lab Results: Group B Strep negative  Results for orders placed or performed during the hospital encounter of 04/12/20 (from the past 24 hour(s))  CBC  Collection Time: 04/12/20  2:26 PM  Result Value Ref Range   WBC 12.3 (H) 4.0 - 10.5 K/uL   RBC 3.89 3.87 - 5.11 MIL/uL   Hemoglobin 10.8 (L) 12.0 - 15.0 g/dL   HCT 35.6 (L) 36 - 46 %   MCV 91.5 80.0 - 100.0 fL   MCH 27.8 26.0 - 34.0 pg   MCHC 30.3 30.0 - 36.0 g/dL   RDW 20.1 (H) 11.5 - 15.5 %   Platelets 254 150 - 400 K/uL   nRBC 0.0 0.0 - 0.2 %  Type and screen [place]   Collection Time: 04/12/20  2:39 PM  Result Value Ref Range   ABO/RH(D) PENDING    Antibody Screen PENDING    Sample Expiration      04/15/2020,2359 Performed at [facility], [place] [address] [place] [address]     Patient Active Problem List   Diagnosis Date Noted  . Indication for care in labor and delivery, antepartum 04/12/2020  . Encounter for induction of labor 04/12/2020  . Anemia affecting pregnancy 02/01/2020  . Depression affecting pregnancy, antepartum 12/20/2019  . Asthma affecting pregnancy in first trimester 10/10/2019  . Hyperemesis gravidarum 09/14/2019  . LGSIL on Pap smear of cervix 09/14/2019  . Supervision of normal first pregnancy, antepartum 09/12/2019  . Chronic pain of left knee 11/23/2014    Assessment/Plan:  Talisa Petrak is a 24 y.o. G1P0 at [redacted]w[redacted]d here in early labor   #Labor: in spontaneous labor, has made change from 1cm yesterday, now currently 4 cm  #Depression: continue zoloft 25 mg daily, pp follow up   #Pain: Pain meds, epidural prn  #FWB: Cat I  #ID:  gbs neg  #MOF:  breast #MOC:undecided #Circ:  N/a   Janet Berlin, MD  04/12/2020, 3:12 PM

## 2020-04-12 NOTE — Anesthesia Preprocedure Evaluation (Signed)
Anesthesia Evaluation  Patient identified by MRN, date of birth, ID band Patient awake    Reviewed: Allergy & Precautions, NPO status , Patient's Chart, lab work & pertinent test results  Airway Mallampati: II  TM Distance: >3 FB Neck ROM: Full    Dental no notable dental hx.    Pulmonary asthma ,    Pulmonary exam normal breath sounds clear to auscultation       Cardiovascular negative cardio ROS Normal cardiovascular exam Rhythm:Regular Rate:Normal     Neuro/Psych Anxiety Depression negative neurological ROS  negative psych ROS   GI/Hepatic negative GI ROS, Neg liver ROS,   Endo/Other  negative endocrine ROS  Renal/GU negative Renal ROS  negative genitourinary   Musculoskeletal negative musculoskeletal ROS (+)   Abdominal   Peds negative pediatric ROS (+)  Hematology negative hematology ROS (+)   Anesthesia Other Findings   Reproductive/Obstetrics (+) Pregnancy                             Anesthesia Physical Anesthesia Plan  ASA: II  Anesthesia Plan: Epidural   Post-op Pain Management:    Induction:   PONV Risk Score and Plan:   Airway Management Planned:   Additional Equipment:   Intra-op Plan:   Post-operative Plan:   Informed Consent:   Plan Discussed with:   Anesthesia Plan Comments:         Anesthesia Quick Evaluation

## 2020-04-12 NOTE — MAU Provider Note (Addendum)
S: [name] is a 24 y.o. G1P0 at [redacted]w[redacted]d  who presents to MAU today for labor evaluation.     Cervical exam by RN:  Dilation: 2 Effacement (%): 80 Station: Ballotable Presentation: Vertex Exam by:: DCALLAWAY, RN  Fetal Monitoring: Baseline: 120 Variability: mod Accelerations: yes Decelerations: no Contractions: q1-3 min, then quiet s/p 10mg  morphine  MDM Discussed patient with RN. NST reviewed and reactive  A: SIUP at 106w2d  Latent labor with painful contractions, prior 2mg  stadol without therapeutic effect earlier today. Achieved therapeutic rest with 10mg  IM morphine  P: Discharge home to continue latent labor Labor precautions and kick counts included in AVS Patient to follow-up with [facility] as scheduled  Patient may return to MAU as needed or when in increased labor   Baldo Ash, MD 04/12/2020 12:22 AM  I also reviewed the [place] and agree with resident's plan as noted above.  Randa Ngo, MD OB Fellow, Faculty Practice 04/12/2020 12:54 AM

## 2020-04-12 NOTE — Discharge Instructions (Signed)

## 2020-04-12 NOTE — Progress Notes (Signed)
Given discrepancy between last two cervical checks, went ahead and evaluated position by [place].   Confirmed cephalic.   Ezequiel Essex, MD

## 2020-04-13 ENCOUNTER — Encounter (HOSPITAL_COMMUNITY): Payer: Self-pay | Admitting: Family Medicine

## 2020-04-13 DIAGNOSIS — Z3A40 40 weeks gestation of pregnancy: Secondary | ICD-10-CM

## 2020-04-13 LAB — BASIC METABOLIC PANEL
Anion gap: 11 (ref 5–15)
BUN: 12 mg/dL (ref 6–20)
CO2: 20 mmol/L — ABNORMAL LOW (ref 22–32)
Calcium: 9.1 mg/dL (ref 8.9–10.3)
Chloride: 107 mmol/L (ref 98–111)
Creatinine, Ser: 1.24 mg/dL — ABNORMAL HIGH (ref 0.44–1.00)
GFR, Estimated: >60 mL/min (ref >60)
Glucose, Bld: 74 mg/dL (ref 70–99)
Potassium: 3.8 mmol/L (ref 3.5–5.1)
Sodium: 138 mmol/L (ref 135–145)

## 2020-04-13 LAB — SODIUM, URINE, RANDOM: Sodium, Ur: <10 mmol/L

## 2020-04-13 LAB — ALT: ALT: 29 U/L (ref 0–44)

## 2020-04-13 LAB — AST: AST: 38 U/L (ref 15–41)

## 2020-04-13 LAB — CREATININE, URINE, RANDOM: Creatinine, Urine: 121.75 mg/dL

## 2020-04-13 LAB — RPR: RPR Ser Ql: NONREACTIVE

## 2020-04-13 MED ORDER — DIBUCAINE (PERIANAL) 1 % EX OINT: 1.0000 "application " | TOPICAL_OINTMENT | CUTANEOUS | Status: DC | PRN

## 2020-04-13 MED ORDER — DIPHENHYDRAMINE HCL 25 MG PO CAPS: 25.0000 mg | ORAL_CAPSULE | Freq: Four times a day (QID) | ORAL | Status: DC | PRN

## 2020-04-13 MED ORDER — LACTATED RINGERS IV BOLUS
500.0000 mL | Freq: Once | INTRAVENOUS | Status: AC
  Administered 2020-04-13: 500 mL via INTRAVENOUS

## 2020-04-13 MED ORDER — ALBUTEROL SULFATE HFA 108 (90 BASE) MCG/ACT IN AERS
1.0000 | INHALATION_SPRAY | Freq: Four times a day (QID) | RESPIRATORY_TRACT | Status: DC | PRN
  Filled 2020-04-13: qty 6.7

## 2020-04-13 MED ORDER — BUPIVACAINE HCL (PF) 0.25 % IJ SOLN
INTRAMUSCULAR | Status: DC | PRN
  Administered 2020-04-13: 8 mL via EPIDURAL

## 2020-04-13 MED ORDER — ONDANSETRON HCL 4 MG/2ML IJ SOLN: 4.0000 mg | INTRAMUSCULAR | Status: DC | PRN

## 2020-04-13 MED ORDER — LIDOCAINE-EPINEPHRINE (PF) 2 %-1:200000 IJ SOLN
INTRAMUSCULAR | Status: DC | PRN
  Administered 2020-04-13 (×4): 5 mL via EPIDURAL

## 2020-04-13 MED ORDER — IBUPROFEN 600 MG PO TABS
600.0000 mg | ORAL_TABLET | Freq: Four times a day (QID) | ORAL | Status: DC
  Administered 2020-04-13 – 2020-04-15 (×6): 600 mg via ORAL
  Filled 2020-04-13 (×6): qty 1

## 2020-04-13 MED ORDER — OXYTOCIN-SODIUM CHLORIDE 30-0.9 UT/500ML-% IV SOLN
1.0000 m[IU]/min | INTRAVENOUS | Status: DC
  Administered 2020-04-13: 2 m[IU]/min via INTRAVENOUS

## 2020-04-13 MED ORDER — ONDANSETRON HCL 4 MG PO TABS: 4.0000 mg | ORAL_TABLET | ORAL | Status: DC | PRN

## 2020-04-13 MED ORDER — BENZOCAINE-MENTHOL 20-0.5 % EX AERO
1.0000 "application " | INHALATION_SPRAY | CUTANEOUS | Status: DC | PRN
  Filled 2020-04-13: qty 56

## 2020-04-13 MED ORDER — SIMETHICONE 80 MG PO CHEW: 80.0000 mg | CHEWABLE_TABLET | ORAL | Status: DC | PRN

## 2020-04-13 MED ORDER — COCONUT OIL OIL: 1.0000 "application " | TOPICAL_OIL | Status: DC | PRN

## 2020-04-13 MED ORDER — TETANUS-DIPHTH-ACELL PERTUSSIS 5-2.5-18.5 LF-MCG/0.5 IM SUSY: 0.5000 mL | PREFILLED_SYRINGE | Freq: Once | INTRAMUSCULAR | Status: DC

## 2020-04-13 MED ORDER — FENTANYL CITRATE (PF) 100 MCG/2ML IJ SOLN
INTRAMUSCULAR | Status: DC | PRN
Start: 2020-04-13 — End: 2020-04-13
  Administered 2020-04-13: 100 ug via EPIDURAL

## 2020-04-13 MED ORDER — WITCH HAZEL-GLYCERIN EX PADS: 1.0000 "application " | MEDICATED_PAD | CUTANEOUS | Status: DC | PRN

## 2020-04-13 MED ORDER — FENTANYL CITRATE (PF) 100 MCG/2ML IJ SOLN
INTRAMUSCULAR | Status: AC
  Filled 2020-04-13: qty 2

## 2020-04-13 MED ORDER — TERBUTALINE SULFATE 1 MG/ML IJ SOLN: 0.2500 mg | Freq: Once | INTRAMUSCULAR | Status: DC | PRN

## 2020-04-13 MED ORDER — PRENATAL MULTIVITAMIN CH
1.0000 | ORAL_TABLET | Freq: Every day | ORAL | Status: DC
  Administered 2020-04-14 – 2020-04-15 (×2): 1 via ORAL
  Filled 2020-04-13 (×2): qty 1

## 2020-04-13 MED ORDER — SERTRALINE HCL 25 MG PO TABS
25.0000 mg | ORAL_TABLET | Freq: Every day | ORAL | Status: DC
  Administered 2020-04-15: 25 mg via ORAL
  Filled 2020-04-13 (×2): qty 1

## 2020-04-13 MED ORDER — DIPHENHYDRAMINE HCL 50 MG/ML IJ SOLN
25.0000 mg | Freq: Once | INTRAMUSCULAR | Status: AC
  Administered 2020-04-13: 25 mg via INTRAVENOUS
  Filled 2020-04-13: qty 1

## 2020-04-13 MED ORDER — SENNOSIDES-DOCUSATE SODIUM 8.6-50 MG PO TABS
2.0000 | ORAL_TABLET | ORAL | Status: DC
  Administered 2020-04-13 – 2020-04-14 (×2): 2 via ORAL
  Filled 2020-04-13 (×2): qty 2

## 2020-04-13 MED ORDER — MEASLES, MUMPS & RUBELLA VAC IJ SOLR: 0.5000 mL | Freq: Once | INTRAMUSCULAR | Status: DC

## 2020-04-13 MED ORDER — ACETAMINOPHEN 325 MG PO TABS
650.0000 mg | ORAL_TABLET | ORAL | Status: DC | PRN
  Administered 2020-04-14: 650 mg via ORAL
  Filled 2020-04-13: qty 2

## 2020-04-13 NOTE — Progress Notes (Signed)
Vicki Harper is a 24 y.o. G1P0 at [redacted]w[redacted]d.  Subjective: Comfortable w/ epidural.   Objective: BP 106/64   Pulse 64   Temp 98.4 F (36.9 C) (Oral)   Resp 18   Ht 5\' 6"  (1.676 m)   Wt 77 kg   LMP 07/05/2019   SpO2 100%   BMI 27.41 kg/m    FHT:  FHR: 120 bpm, variability: min (since 0630),  accelerations: 10x10,  decelerations:  prolonged x 1 lasting 3 minutes. Recovered w/ position chnage UC:   Q 3-4 minutes, strong Dilation: 6 Effacement (%): 90 Cervical Position: Anterior Station: 0 Presentation: Vertex Exam by:: [email or web address],rnc-ob  Labs: Results for orders placed or performed during the hospital encounter of 04/12/20 (from the past 24 hour(s))  Respiratory Panel by RT PCR (Flu A&B, Covid) - Nasopharyngeal Swab     Status: None   Collection Time: 04/12/20  2:26 PM   Specimen: Nasopharyngeal Swab  Result Value Ref Range   SARS Coronavirus 2 by RT PCR NEGATIVE NEGATIVE   Influenza A by PCR NEGATIVE NEGATIVE   Influenza B by PCR NEGATIVE NEGATIVE  CBC     Status: Abnormal   Collection Time: 04/12/20  2:26 PM  Result Value Ref Range   WBC 12.3 (H) 4.0 - 10.5 K/uL   RBC 3.89 3.87 - 5.11 MIL/uL   Hemoglobin 10.8 (L) 12.0 - 15.0 g/dL   HCT 35.6 (L) 36 - 46 %   MCV 91.5 80.0 - 100.0 fL   MCH 27.8 26.0 - 34.0 pg   MCHC 30.3 30.0 - 36.0 g/dL   RDW 20.1 (H) 11.5 - 15.5 %   Platelets 254 150 - 400 K/uL   nRBC 0.0 0.0 - 0.2 %  Type and screen [place]     Status: None   Collection Time: 04/12/20  2:39 PM  Result Value Ref Range   ABO/RH(D) B POS    Antibody Screen NEG    Sample Expiration      04/15/2020,2359 Performed at [facility], [address] [place], [place] [address]     Assessment / Plan: [redacted]w[redacted]d week IUP Labor: Active Fetal Wellbeing:  Category II, overall reassuring. Will CTO closely.   Pain Control:  Epidural Anticipated MOD:  SVD  Tamala Julian [place], [place] 04/13/2020  9:40 AM

## 2020-04-13 NOTE — Progress Notes (Signed)
Vicki Harper is a 24 y.o. G1P0 at [redacted]w[redacted]d.  Subjective: Strong rectal pressure  Objective: BP 125/79   Pulse 70   Temp 98.4 F (36.9 C) (Oral)   Resp 18   Ht 5\' 6"  (1.676 m)   Wt 77 kg   LMP 07/05/2019   SpO2 100%   BMI 27.41 kg/m    FHT:  FHR: 125 bpm, variability: min-mod,  accelerations:  10x10,  decelerations:  None. + Scalp stim  UC:   Q 3-5 minutes, mod-strong Dilation: 7 Effacement (%): 100 Cervical Position: Anterior Station: 0, Plus 1 Presentation: Vertex Exam by:: [email or web address],cnm  IUPC placed w/out difficulty   Subjective concern for decreased UOP.   Labs: NA  Assessment / Plan: [redacted]w[redacted]d week IUP Labor: Active, protracted active phase. Inadequate UC's since pitocin turned off. Will restart.  Fetal Wellbeing:  Category II, but overall reassuring with increase variability, + scalp stim and accels.  Pain Control:  Epidural. Will have it redosed.  Anticipated MOD:  Uncertain. Will CTO FHR and labor progress closely.  Possible decreased urine OP even after IV fluid boluses. Will have RN measure and document and F/U PRN.   Tamala Julian, [place], [place] 04/13/2020 11:00 AM

## 2020-04-13 NOTE — Progress Notes (Signed)
Labor Progress Note Vicki Harper is a 24 y.o. G1P0 at [redacted]w[redacted]d presented for SOL.   S: Pt continues to endorse vaginal pressure with epidural in place. No other concerns at this time.  O:  BP 112/62   Pulse 65   Temp 99 F (37.2 C) (Oral)   Resp 18   Ht 5\' 6"  (1.676 m)   Wt 77 kg   LMP 07/05/2019   SpO2 100%   BMI 27.41 kg/m  EFM: baseline 120 bpm / moderate variability / accels present, no recent decels  CVE: Dilation: 4 Effacement (%): 90 Cervical Position: Anterior Station: -1 Presentation: Vertex Exam by:: Jaymes Graff RN   A&P: 24 y.o. G1P0 [redacted]w[redacted]d presented for SOL.  #Labor: Pt with unchanged cervical exam for the past 4 hours. Given lack of cervical change and unchanged fetal station will initiate pitocin at this time. Will plan to recheck in 4 hours or sooner as clinically indicated. #Pain: Epidural in place #FWB: Cat 1 strip #GBS negative #Depression on zoloft: will continue zoloft, plan for postpartum SW consult #Anemic on iron: Admission Hgb 10.8 #PMHx asthma: will avoid hemabate  Randa Ngo, MD OB Fellow, Faculty Practice 04/13/2020 12:45 AM

## 2020-04-13 NOTE — Discharge Summary (Addendum)
Postpartum Discharge Summary     Patient Name: Vicki Harper DOB: 1996-05-14 MRN: [id number]  Date of admission: 04/12/2020 Delivery date:04/13/2020  Delivering provider: Janet Berlin  Date of discharge: 04/15/2020  Admitting diagnosis: Indication for care in labor and delivery, antepartum [O75.9] Encounter for induction of labor [Z34.90] Intrauterine pregnancy: [redacted]w[redacted]d    Secondary diagnosis:  Active Problems:   Indication for care in labor and delivery, antepartum   Encounter for induction of labor   Vaginal delivery  Additional problems: AKI    Discharge diagnosis: Term Pregnancy Delivered                                              Post partum procedures:none Augmentation: AROM and Pitocin Complications: None  Hospital course: Onset of Labor With Vaginal Delivery      24y.o. yo G1P0 at 458w3das admitted in Latent Labor on 04/12/2020. Patient had an uncomplicated labor course as follows:  Membrane Rupture Time/Date: 9:40 PM ,04/12/2020   Delivery Method:Vaginal, Spontaneous  Episiotomy: None  Lacerations:  Labial  Patient had an uncomplicated postpartum course.  She is ambulating, tolerating a regular diet, passing flatus, and urinating well. Patient is discharged home in stable condition on 04/15/20.  Newborn Data: Birth date:04/13/2020  Birth time:5:51 PM  Gender:Female  Living status:Living  Apgars:8 ,8  Weight:3500 g   Magnesium Sulfate received: No BMZ received: No Rhophylac:N/A MMR:N/A T-DaP:Given prenatally Flu: No Transfusion:No  Physical exam  Vitals:   04/14/20 0900 04/14/20 1418 04/14/20 2007 04/15/20 0550  BP: 100/76 121/81 117/75 111/79  Pulse: 60 (!) 57 76 (!) 57  Resp: 18 18 18 18   Temp: 98.5 F (36.9 C) 98.4 F (36.9 C) 99 F (37.2 C) 98.7 F (37.1 C)  TempSrc: Oral Oral Oral Oral  SpO2: 100% 99% 99% 100%  Weight:      Height:       General: alert, cooperative and no distress Lochia: appropriate Uterine Fundus: firm Incision:  n/a DVT Evaluation: No evidence of DVT seen on physical exam. Labs: Lab Results  Component Value Date   WBC 12.3 (H) 04/12/2020   HGB 10.8 (L) 04/12/2020   HCT 35.6 (L) 04/12/2020   MCV 91.5 04/12/2020   PLT 254 04/12/2020   CMP Latest Ref Rng & Units 04/15/2020  Glucose 70 - 99 mg/dL -  BUN 6 - 20 mg/dL -  Creatinine 0.44 - 1.00 mg/dL 0.94  Sodium 135 - 145 mmol/L -  Potassium 3.5 - 5.1 mmol/L -  Chloride 98 - 111 mmol/L -  CO2 22 - 32 mmol/L -  Calcium 8.9 - 10.3 mg/dL -  Total Protein 6.5 - 8.1 g/dL -  Total Bilirubin 0.3 - 1.2 mg/dL -  Alkaline Phos 38 - 126 U/L -  AST 15 - 41 U/L -  ALT 0 - 44 U/L -   Edinburgh Score: No flowsheet data found.   After visit meds:  Allergies as of 04/15/2020   No Known Allergies     Medication List    STOP taking these medications   acetaminophen 500 MG tablet Commonly known as: TYLENOL   albuterol 108 (90 Base) MCG/ACT inhaler Commonly known as: VENTOLIN HFA   butalbital-acetaminophen-caffeine 50-325-40 MG tablet Commonly known as: FIORICET   clotrimazole 1 % cream Commonly known as: LOPresho  cyclobenzaprine 10 MG tablet Commonly known as: FLEXERIL   dimenhyDRINATE 50 MG tablet Commonly known as: Dramamine   metoCLOPramide 10 MG tablet Commonly known as: REGLAN   metroNIDAZOLE 500 MG tablet Commonly known as: FLAGYL   ondansetron 4 MG disintegrating tablet Commonly known as: Zofran ODT   pantoprazole 20 MG tablet Commonly known as: Protonix   Prenatal Vitamin 27-0.8 MG Tabs     TAKE these medications   ferrous sulfate 325 (65 FE) MG tablet Commonly known as: FerrouSul Take 1 tablet (325 mg total) by mouth daily with breakfast. What changed: when to take this   ibuprofen 600 MG tablet Commonly known as: ADVIL Take 1 tablet (600 mg total) by mouth every 6 (six) hours.   sertraline 25 MG tablet Commonly known as: Zoloft Take 1 tablet (25 mg total) by mouth  daily.        Discharge home in stable condition Infant Feeding: Breast Infant Disposition:home with mother Discharge instruction: per After Visit Summary and Postpartum booklet. Activity: Advance as tolerated. Pelvic rest for 6 weeks.  Diet: routine diet Future Appointments: Future Appointments  Date Time Provider [place]  04/22/2020  1:15 PM [facility]  05/09/2020  1:15 PM Truett Mainland, DO [facility] None   Follow up Visit:  [facility] [place] Follow up.   Specialty: Obstetrics and Gynecology Contact information: [place] [address] [phone]               Please schedule this patient for a In person postpartum visit in 6 weeks with the following provider: Any provider. Additional Postpartum F/U:Postpartum Depression checkup  Low risk pregnancy complicated by: depression Delivery mode:  Vaginal, Spontaneous  Anticipated Birth Control:  Unsure   04/15/2020 Truett Mainland, DO

## 2020-04-13 NOTE — Progress Notes (Signed)
Pt bearing down. Pt yelling out. Perfomed SVE. 6-7 but cervix swelling. Plus 2 station. EPCA given. Pt quickly given peri-care/gown & linen change. Turned to right per pt's desire. Pt requests not to used Peanut. She has found it to be uncomfortable and increased her pain.

## 2020-04-13 NOTE — Progress Notes (Signed)
Patient ID: Vicki Harper, female   DOB: 1995/08/13, 24 y.o.   MRN: [id number] Creatine elevated at 1.24 (Baseline 0.82 03/27/20) Is averaging 50 cc/hr urine OP. BP Nml (one elevated). Has already received 5 liters of IV fluids in past 22 hours. Discussed w/ Dr. Roselie Awkward. Agrees w/ plan to add remaining HELLP labs.   Tamala Julian, [place], [place] 04/13/2020 1:33 PM

## 2020-04-13 NOTE — Progress Notes (Signed)
Urine labs resulted.   Calculated FENa 0.1%, indicating pre-renal etiology. Given large amounts of IV fluids given thus far, most likely pre-renal etiology is obstructive process from gravid uterus.   Serum Na 138.00, Cr 1.24 Urine Na <10, Cr 121.75  Will repeat Cr in the morning to trend AKI.   Ezequiel Essex, MD

## 2020-04-13 NOTE — Progress Notes (Signed)
Lulie Hurd is a 24 y.o. G1P0 at [redacted]w[redacted]d.  Subjective: Exhausted    Objective: BP 109/62   Pulse 75   Temp 98.5 F (36.9 C) (Axillary)   Resp 18   Ht 5\' 6"  (1.676 m)   Wt 77 kg   LMP 07/05/2019   SpO2 100%   BMI 27.41 kg/m    FHT:  FHR: 120 bpm, variability: minimal, accelerations: absent decelerations:absent UC:   q2-4 minute, adequate  Dilation: 6 Effacement (%): 90 Cervical Position: Anterior Station: Plus 2 Presentation: Vertex Exam by:: [email or web address],rnc-ob  Labs: Results for orders placed or performed during the hospital encounter of 04/12/20 (from the past 24 hour(s))  Basic metabolic panel     Status: Abnormal   Collection Time: 04/13/20 11:25 AM  Result Value Ref Range   Sodium 138 135 - 145 mmol/L   Potassium 3.8 3.5 - 5.1 mmol/L   Chloride 107 98 - 111 mmol/L   CO2 20 (L) 22 - 32 mmol/L   Glucose, Bld 74 70 - 99 mg/dL   BUN 12 6 - 20 mg/dL   Creatinine, Ser 1.24 (H) 0.44 - 1.00 mg/dL   Calcium 9.1 8.9 - 10.3 mg/dL   GFR, Estimated >60 >60 mL/min   Anion gap 11 5 - 15  AST     Status: None   Collection Time: 04/13/20 11:25 AM  Result Value Ref Range   AST 38 15 - 41 U/L  ALT     Status: None   Collection Time: 04/13/20 11:25 AM  Result Value Ref Range   ALT 29 0 - 44 U/L  Sodium, urine, random     Status: None   Collection Time: 04/13/20  1:31 PM  Result Value Ref Range   Sodium, Ur <10 mmol/L  Creatinine, urine, random     Status: None   Collection Time: 04/13/20  1:31 PM  Result Value Ref Range   Creatinine, Urine 121.75 mg/dL    Assessment / Plan: [redacted]w[redacted]d week IUP, admitted for spontaneous labor  Labor: admitted yesterday and made slow change from 3cm. Now has been 6-7 cm since 7am. Cervical swelling noted. IUPC placed at 11AM, demonstrated inadequate contractions. Contractions have been adequate since approximately 1PM. Pitocin currently at 6. Will give IV benadryl for cervical swelling and recheck in 2-3 hours.   AKI: noted to have low urine  output. CMP demonstrated creatinine 1.24. LFT's, plt normal. Pressures have been normotensive. She is s/p fluid resuscitation. Continue to monitor.   Fetal Wellbeing:  Category II, overall reassuring. Will CTO closely.   Pain Control:  Epidural Anticipated MOD:  Uncertain at this time   Janet Berlin, MD 04/13/2020  9:40 AM

## 2020-04-14 LAB — CREATININE, SERUM
Creatinine, Ser: 1.14 mg/dL — ABNORMAL HIGH (ref 0.44–1.00)
GFR, Estimated: >60 mL/min (ref >60)

## 2020-04-14 MED ORDER — CYCLOBENZAPRINE HCL 10 MG PO TABS
5.0000 mg | ORAL_TABLET | Freq: Three times a day (TID) | ORAL | Status: DC | PRN
  Administered 2020-04-14: 5 mg via ORAL
  Filled 2020-04-14: qty 1

## 2020-04-14 NOTE — Anesthesia Postprocedure Evaluation (Signed)
Anesthesia Post Note  Patient: Vicki Harper  Procedure(s) Performed: AN AD [place]     Patient location during evaluation: Mother Baby Anesthesia Type: Epidural Level of consciousness: awake and alert Pain management: pain level controlled Vital Signs Assessment: post-procedure vital signs reviewed and stable Respiratory status: spontaneous breathing, nonlabored ventilation and respiratory function stable Cardiovascular status: stable Postop Assessment: no headache, no backache and epidural receding Anesthetic complications: no   No complications documented.  Last Vitals:  Vitals:   04/14/20 0108 04/14/20 0504  BP: 111/73 115/82  Pulse: 63 77  Resp: 16 18  Temp: 36.8 C 36.7 C  SpO2: 100% 100%    Last Pain:  Vitals:   04/14/20 0504  TempSrc: Oral  PainSc:    Pain Goal:                   Rayvon Char

## 2020-04-14 NOTE — Lactation Note (Signed)
This note was copied from a baby's chart. Lactation Consultation Note Baby's temp has been cold. Has been warmed up under warmer but is cooling down again. FOB holding baby when [place] came into rm.  Mom has long tubular breast w/flat nipples, breast compressible.  Hand expression taught. Easily expressed colostrum. Praised mom. Encouraged mom to wear bra in am for support.   Newborn behavior, feeding habits, STS, I&O, supply and demand discussed. Mom encouraged to feed baby 8-12 times/24 hours and with feeding cues.   Mom stated baby has latched w/no pain. Encouraged mom to call for assistance in latching if needed.  Lactation brochure given.  Patient Name: Vicki Harper [id number] Date: 04/14/2020 Reason for consult: Initial assessment;Primapara;Term   Maternal Data Has patient been taught Hand Expression?: Yes Does the patient have breastfeeding experience prior to this delivery?: No  Feeding Feeding Type: Breast Milk  LATCH Score       Type of Nipple: Flat  Comfort (Breast/Nipple): Soft / non-tender        Interventions Interventions: Breast feeding basics reviewed;Support pillows;Skin to skin;Breast massage;Hand express;Breast compression;Hand pump;Comfort gels;Pre-pump if needed  Lactation Tools Discussed/Used WIC Program: Yes Pump Review: Setup, frequency, and cleaning;Milk Storage Initiated by:: RN Date initiated:: 04/13/20   Consult Status Consult Status: Follow-up Date: 04/14/20 Follow-up type: In-patient    Theodoro Kalata 04/14/2020, 2:46 AM

## 2020-04-14 NOTE — Progress Notes (Addendum)
POSTPARTUM PROGRESS NOTE  Post Partum Day 1  Subjective:  Vicki Harper is a 24 y.o. G1P1001 s/p NSVD at [redacted]w[redacted]d.  No acute events overnight.  Pt denies problems with ambulating, voiding or po intake.  She denies nausea or vomiting.  Pain is well controlled.  She has had flatus. She has had bowel movement.  Lochia Small.   Objective: Blood pressure 111/73, pulse 63, temperature 98.2 F (36.8 C), temperature source Oral, resp. rate 16, height 5\' 6"  (1.676 m), weight 77 kg, last menstrual period 07/05/2019, SpO2 100 %, unknown if currently breastfeeding.  Physical Exam:  General: alert, cooperative and no distress Chest: no respiratory distress Heart:regular rate, distal pulses intact Abdomen: soft, nontender,  Uterine Fundus: firm, appropriately tender DVT Evaluation: No calf swelling or tenderness Extremities: no edema Skin: warm, dry;   Recent Labs    04/12/20 1426  HGB 10.8*  HCT 35.6*    Assessment/Plan: Vicki Harper is a 24 y.o. G1P1001 s/p NSVD at [redacted]w[redacted]d. Creatine is down trending to 1.14 from 1.24. Urine output is good, 1660cc out last night.   PPD#1 - Doing well Contraception: PP LILETTA Feeding: Breast Depression: continue SSRI. Counseled on PP depression return precautions. Dispo: Plan for discharge 1-2 days.   LOS: 2 days   Lajoyce Lauber, Medical Student, 04/14/2020, 3:27 AM   Attestation of Supervision of Student:  I confirm that I have verified the information documented in the  resident's  note and that I have also personally reperformed the history, physical exam and all medical decision making activities.  I have verified that all services and findings are accurately documented in this student's note; and I agree with management and plan as outlined in the documentation. I have also made any necessary editorial changes.  Randa Ngo, [place] [facility] 04/14/2020 3:05 PM

## 2020-04-14 NOTE — Social Work (Signed)
CSW received consult for hx of Anxiety and Depression. CSW met with MOB to offer support and complete assessment.     CSW introduced self and role. CSW observed FOB on couch holding newborn. CSW asked MOB if she would like to speak alone, MOB stated FOB could stay for assessment. MOB was alert and engaged throughout assessment. CSW informed MOB of reason for consult. MOB disclosed a mental diagnosis of only depression. MOB stated she was first diagnosed in 2016. MOB is currently on Zoloft 15m which she expressed is helpful. MOB stated [facility] which she finds to be helpful. MOB shared she utilizes time to self and walking as coping mechanisms to address depressive symptoms. MOB denies any current SI or HI. MOB identified FOB and parents as supports.   CSW provided education regarding the baby blues period vs. perinatal mood disorders, discussed treatment and gave resources for mental health follow up if concerns arise.  CSW recommends self-evaluation during the postpartum time period using the New Mom Checklist from Postpartum Progress and encouraged MOB to contact a medical professional if symptoms are noted at any time.    CSW provided review of Sudden Infant Death Syndrome (SIDS) precautions.  MOB stated she is in the process of getting a bassinet for baby and was accepting of a baby box for newborn to have a safe sleep space once discharged home. MOB has all other essential needs for baby, including a new carseat. Baby will receive follow-up care at [facility] MOB denies any transportation barriers. MOB declined any additional community referrals or resources.  CSW identifies no further need for intervention and no barriers to discharge at this time.  CDarra Lis LHandleyWork WEnterprise Productsand CMolson [employer]([phone]

## 2020-04-14 NOTE — Lactation Note (Addendum)
This note was copied from a baby's chart. Lactation Consultation Note  Patient Name: Vicki Harper [id number] Date: 04/14/2020  Referral from RN regarding  Baby will not latch.  Attempt to assist with latch in cross cradle hold on left breast.  Mom with pendulous breasts and flat/short shaft nipples.  Infant will not latch.  After a few attempts offered infant some drops from a spoon.  Able to hand express easily.  LC fed infant close to 15 ml of expressed mothers milk Via spoon.  Infant pooped.  LC changed diaper and retry to latch infant in football.  Infant will open but just gets on the tip and sucking lips back and forth.  After a few attempts put infant STS. Left infant STS.  Infant no longer cuing. Mom asked about pumping.  Discussed hand expression and spoon feeding until infant breastfeeding well if infant will not latch and give it a little longer to initiate pumping with DEBP since mom can hand express so easily. Mom reports she has a manual breast pump but it is broken.  Manual Pump put together incorrectly.  Showed mom how to put pump together.  Discussed trying to prepump prior to latching to evert nipple and get milk started for infant.   Praised breastfeeding.  Praised mom having healthy full term baby and such good breastmilk flow. Left infant STS with mom/  Urged to call lactation as needed.   Maternal Data    Feeding    LATCH Score                   Interventions    Lactation Tools Discussed/Used     Consult Status      Vicki Harper 04/14/2020, 1:18 PM

## 2020-04-14 NOTE — Lactation Note (Signed)
This note was copied from a baby's chart. Lactation Consultation Note Baby 85 hrs old. Mom stated baby is BF a lot. Mom stated baby acts like she wants to eat all the time. She falls asleep at the breast. Reminded of cluster feeding, feeding habits, newborn behavior.  Mom stated she likes the football hold. She feels like she can get a deep latch in that position. Mom denies soreness to nipples.  When baby was crying noted limited mobility of tongue and labial tight frenulum noted. Baby latched well with wide open mouth. LC flanged upper lip a little but she had a good latch. Swallows heard. praised mom.  Mom and FOB does a lot of STS.  Encouraged to call for questions or concerns.  Patient Name: Vicki Harper [id number] Date: 04/14/2020 Reason for consult: Follow-up assessment;Primapara;Term   Maternal Data Has patient been taught Hand Expression?: Yes Does the patient have breastfeeding experience prior to this delivery?: No  Feeding Feeding Type: Breast Fed  LATCH Score Latch: Grasps breast easily, tongue down, lips flanged, rhythmical sucking.  Audible Swallowing: Spontaneous and intermittent  Type of Nipple: Flat  Comfort (Breast/Nipple): Filling, red/small blisters or bruises, mild/mod discomfort (breast filling heavy, very compressible)  Hold (Positioning): Assistance needed to correctly position infant at breast and maintain latch.  LATCH Score: 7  Interventions Interventions: Breast feeding basics reviewed;Support pillows;Assisted with latch;Position options;Skin to skin;Breast massage;Breast compression;Adjust position  Lactation Tools Discussed/Used     Consult Status Consult Status: Follow-up Date: 04/15/20 Follow-up type: In-patient    Theodoro Kalata 04/14/2020, 11:41 PM

## 2020-04-15 LAB — CREATININE, SERUM
Creatinine, Ser: 0.94 mg/dL (ref 0.44–1.00)
GFR, Estimated: >60 mL/min (ref >60)

## 2020-04-15 MED ORDER — SERTRALINE HCL 25 MG PO TABS: 25.0000 mg | ORAL_TABLET | Freq: Every day | ORAL | 1 refills | Status: DC

## 2020-04-15 MED ORDER — IBUPROFEN 600 MG PO TABS
600.0000 mg | ORAL_TABLET | Freq: Four times a day (QID) | ORAL | 0 refills | Status: DC
Start: 2020-04-15 — End: 2020-05-30

## 2020-04-15 MED ORDER — FERROUS SULFATE 325 (65 FE) MG PO TABS
325.0000 mg | ORAL_TABLET | Freq: Every day | ORAL | 1 refills | Status: DC
Start: 2020-04-15 — End: 2023-04-05

## 2020-04-15 NOTE — Lactation Note (Signed)
This note was copied from a baby's chart. Lactation Consultation Note  Patient Name: Vicki Harper Date: 04/15/2020 Reason for consult: Follow-up assessment  P1 mother whose infant is now 80 hours old.  This is a term baby at 40+3 weeks.  Mother had no questions/concerns related to breast feeding.  She did mention, however, that baby is sleepy at the breast.  Offered to assist with latching and mother agreeable.  Mother's breasts are large, pendulous, soft and non tender and nipples are everted and intact.  Assisted baby to latch to the left breast in the cross cradle hold easily.  Baby took a few sucks and wanted to sleep.  Demonstrated how to actively keep her engaged with feeding.  She required constant stimulation to keep sucking.  Intermittent swallows noted.  Explained that father can assist with this when I am not present.  Observed baby feeding for 10 minutes.  At the end of 10 minutes she fell asleep.  Placed her STS on mother's chest and she remained sleeping.  Engorgement prevention/treatment reviewed.  Mother has a manual pump and a DEBP for home use.  She has our phone number for OP questions.  Provided coconut oil for comfort.  Mother will call for any further concerns.  Rn updated.   Maternal Data    Feeding Feeding Type: Breast Fed  LATCH Score Latch: Repeated attempts needed to sustain latch, nipple held in mouth throughout feeding, stimulation needed to elicit sucking reflex.  Audible Swallowing: Spontaneous and intermittent  Type of Nipple: Everted at rest and after stimulation  Comfort (Breast/Nipple): Soft / non-tender  Hold (Positioning): Assistance needed to correctly position infant at breast and maintain latch.  LATCH Score: 8  Interventions Interventions: Breast feeding basics reviewed;Assisted with latch;Skin to skin;Breast massage;Hand express;Breast compression;Adjust position;Coconut oil;Position options;Support pillows  Lactation Tools  Discussed/Used     Consult Status Consult Status: Complete Date: 04/16/20 Follow-up type: Call as needed    Vicki Harper 04/15/2020, 9:27 AM

## 2020-04-15 NOTE — Discharge Instructions (Signed)

## 2020-04-15 NOTE — Progress Notes (Cosign Needed)
POSTPARTUM PROGRESS NOTE  Post Partum Day 2  Subjective:  Vicki Harper is a 24 y.o. G1P1001 s/p SVD at [redacted]w[redacted]d.  No acute events overnight.  Pt denies problems with ambulating, voiding or po intake.  She denies nausea or vomiting.  Pain is moderately controlled. She complains of constant cramping pain.  She has had flatus. She has not had bowel movement.  Lochia Small.   Objective: Blood pressure 111/79, pulse (!) 57, temperature 98.7 F (37.1 C), temperature source Oral, resp. rate 18, height 5\' 6"  (1.676 m), weight 77 kg, last menstrual period 07/05/2019, SpO2 100 %, unknown if currently breastfeeding.  Physical Exam:  General: alert, cooperative and no distress Chest: no respiratory distress Heart:regular rate, distal pulses intact Abdomen: soft, nontender,  Uterine Fundus: firm, appropriately tender DVT Evaluation: No calf swelling or tenderness Extremities: minimal edema Skin: warm, dry  Recent Labs    04/12/20 1426  HGB 10.8*  HCT 35.6*    Assessment/Plan: Vicki Harper is a 24 y.o. G1P1001 s/p SVD at [redacted]w[redacted]d   PPD#2 - Patient is still complaining of cramping pain and is unsure of going home  Contraception: declined  Feeding: breast Dispo: Plan for discharge today.   LOS: 3 days   Landry Corporal, CNM 04/15/2020, 9:54 AM

## 2020-04-16 NOTE — BH Specialist Note (Signed)
Integrated Behavioral Health via Telemedicine Phone (Caregility) Visit  04/16/2020 Vicki Harper [id number]  Number of Integrated Behavioral Health visits: 5 Session Start time: 1:18  Session End time: 1:40 Total time: 22 minutes  Referring Provider: Hansel Feinstein, CNM Type of Service: Individual Patient/Family location: Home [facility] Provider location: [facility] [place] [facility]  All persons participating in visit: Patient Vicki Harper and [place]     I connected with Vicki Harper   by a video enabled telemedicine application (Caregility) and verified that I am speaking with the correct person using two identifiers.   Discussed confidentiality: Yes   Confirmed demographics & insurance:  Yes   I discussed that engaging in this virtual visit, they consent to the provision of behavioral healthcare and the services will be billed under their insurance.   Patient and/or legal guardian expressed understanding and consented to virtual visit: Yes   PRESENTING CONCERNS: Patient and/or family reports the following symptoms/concerns: Pt states her primary concern today is adjusting to new motherhood, with poor appetite, fatigue, and sleep deprivation; breastfeeding is going well, and feels well-supported by FOB, sister and mother.  Duration of problem: Postpartum; Severity of problem: moderate  STRENGTHS (Protective Factors/Coping Skills): Good social support  ASSESSMENT: Patient currently experiencing Adjustment disorder with mixed anxiety and depressed mood.    GOALS ADDRESSED: Patient will: 1.  Reduce symptoms of: anxiety, depression and stress  2.  Increase knowledge and/or ability of: healthy habits  3.  Demonstrate ability to: Increase healthy adjustment to current life circumstances and Increase motivation to adhere to plan of care   Progress of Goals: Ongoing  INTERVENTIONS: Interventions utilized:   Solution-Focused Strategies Standardized Assessments completed & reviewed: Not given today   OUTCOME: Patient Response: Pt agrees to treatment plan   PLAN: 1. Follow up with behavioral health clinician on : Two weeks 2. Behavioral recommendations:   -Continue taking both Zoloft 25mg  and iron, as prescribed -Prioritize sleeping when baby sleeps, even during the daytime, as much as possible for the next two weeks (SLEEP AS MEDICINE for mom!) -Accept offers of help from support system (family; friends) 3. Referral(s): [place] (In Clinic)  I discussed the assessment and treatment plan with the patient and/or parent/guardian. They were provided an opportunity to ask questions and all were answered. They agreed with the plan and demonstrated an understanding of the instructions.   They were advised to call back or seek an in-person evaluation as appropriate.  I discussed that the purpose of this visit is to provide behavioral health care while limiting exposure to the novel coronavirus.  Discussed there is a possibility of technology failure and discussed alternative modes of communication if that failure occurs.  [name] 

## 2020-04-17 ENCOUNTER — Other Ambulatory Visit: Payer: Self-pay

## 2020-04-17 MED ORDER — VITAFOL-NANO 18-0.6-0.4 MG PO TABS: 1.0000 | ORAL_TABLET | Freq: Every day | ORAL | 6 refills | Status: DC

## 2020-04-22 ENCOUNTER — Ambulatory Visit (INDEPENDENT_AMBULATORY_CARE_PROVIDER_SITE_OTHER): Payer: Medicaid Other | Admitting: Clinical

## 2020-04-22 ENCOUNTER — Other Ambulatory Visit: Payer: Self-pay

## 2020-04-22 DIAGNOSIS — F4323 Adjustment disorder with mixed anxiety and depressed mood: Secondary | ICD-10-CM | POA: Diagnosis not present

## 2020-04-22 MED ORDER — PRENATAL VITAMINS 28-0.8 MG PO TABS
28.0000 mg | ORAL_TABLET | Freq: Every day | ORAL | 6 refills | Status: DC
Start: 2020-04-22 — End: 2020-05-30

## 2020-04-22 NOTE — Patient Instructions (Signed)
[facility] [address] [place], [place] [address] [phone] (main office) [phone] (Revere Maahs's office)  New mom support:  [email or web address] Www.[email or web address]  

## 2020-04-26 NOTE — BH Specialist Note (Addendum)
Integrated Behavioral Health via Telemedicine Phone (Caregility) Visit  04/26/2020 Vicki Harper [id number]  Number of [place] visits: 6 Session Start time: 9:48  Session End time: 10:20 Total time: 32 minutes  Referring Provider: Hansel Feinstein, CNM Type of Service: Individual Patient/Family location: Home [facility] Provider location: [facility] [place] [facility]  All persons participating in visit: Patient Vicki Harper and [place]    I connected with Vicki Harper  by a video enabled telemedicine application (Caregility) and verified that I am speaking with the correct person using two identifiers.   Discussed confidentiality: Yes   Confirmed demographics & insurance:  Yes   I discussed that engaging in this virtual visit, they consent to the provision of behavioral healthcare and the services will be billed under their insurance.   Patient and/or legal guardian expressed understanding and consented to virtual visit: Yes   PRESENTING CONCERNS: Patient and/or family reports the following symptoms/concerns: Pt states her primary concern today is an increase in worry postpartum; still some sleep difficulty attributed to baby's sleep schedule and worry over baby getting enough to eat. Pt is open to referral to psychiatry/outpatient ongoing therapy, due to history of SI in the past; denies current SI, no intent, no plan in 4 years.  Duration of problem: Increase postpartum; Severity of problem: moderate  STRENGTHS (Protective Factors/Coping Skills): Social connections  ASSESSMENT: Patient currently experiencing Adjustment disorder with mixed anxiety and depression.    GOALS ADDRESSED: Patient will: 1.  Reduce symptoms of: anxiety, depression and stress  2.  Demonstrate ability to: Increase healthy adjustment to current life circumstances   Progress of Goals: Ongoing  INTERVENTIONS: Interventions  utilized:  Supportive Counseling, Psychoeducation and/or Health Education and Link to [employer] Standardized Assessments completed & reviewed: GAD-7 and PHQ 9   OUTCOME: Patient Response: Pt agrees to treatment plan   PLAN: 1. Follow up with behavioral health clinician on : Three weeks 2. Behavioral recommendations:  -Continue taking Zoloft and iron as prescribed -Continue daytime naps as needed, when baby sleeps -Accept referral to psychiatry/outpatient therapy for ongoing BH medication management and ongoing therapy -Follow Safety Plan (including 24/7 [facility] walk-in, if SI thoughts return) 3. Referral(s): [place] (In Clinic)  I discussed the assessment and treatment plan with the patient and/or parent/guardian. They were provided an opportunity to ask questions and all were answered. They agreed with the plan and demonstrated an understanding of the instructions.   They were advised to call back or seek an in-person evaluation as appropriate.  I discussed that the purpose of this visit is to provide behavioral health care while limiting exposure to the novel coronavirus.  Discussed there is a possibility of technology failure and discussed alternative modes of communication if that failure occurs.  Vicki Harper Vicki Harper  Depression screen [facility] 2/9 05/07/2020 03/11/2020 01/01/2020 12/18/2019  Decreased Interest 1 0 1 1  Down, Depressed, Hopeless 2 2 1 2   PHQ - 2 Score 3 2 2 3   Altered sleeping 1 0 0 0  Tired, decreased energy 2 3 1 2   Change in appetite 0 0 0 0  Feeling bad or failure about yourself  1 1 1 2   Trouble concentrating 0 0 0 0  Moving slowly or fidgety/restless 0 0 0 0  Suicidal thoughts 1 0 0 1  PHQ-9 Score 8 6 4 8    GAD 7 : Generalized Anxiety Score 05/07/2020 03/11/2020 01/01/2020 12/18/2019  Nervous, Anxious, on Edge 1 0 0 1  Control/stop worrying 2 3 1 2   Worry too much - different things 2 0 1 2  Trouble relaxing 1 0 0 1   Restless 0 0 0 0  Easily annoyed or irritable 2 3 2 3   Afraid - awful might happen 0 0 1 0  Total GAD 7 Score 8 6 5  9

## 2020-05-07 ENCOUNTER — Ambulatory Visit (INDEPENDENT_AMBULATORY_CARE_PROVIDER_SITE_OTHER): Payer: Medicaid Other | Admitting: Clinical

## 2020-05-07 DIAGNOSIS — F4323 Adjustment disorder with mixed anxiety and depressed mood: Secondary | ICD-10-CM | POA: Diagnosis not present

## 2020-05-09 ENCOUNTER — Ambulatory Visit (INDEPENDENT_AMBULATORY_CARE_PROVIDER_SITE_OTHER): Payer: Medicaid Other | Admitting: Family Medicine

## 2020-05-09 ENCOUNTER — Encounter: Payer: Self-pay | Admitting: Family Medicine

## 2020-05-09 ENCOUNTER — Other Ambulatory Visit: Payer: Self-pay

## 2020-05-09 DIAGNOSIS — Z30013 Encounter for initial prescription of injectable contraceptive: Secondary | ICD-10-CM

## 2020-05-09 DIAGNOSIS — R87612 Low grade squamous intraepithelial lesion on cytologic smear of cervix (LGSIL): Secondary | ICD-10-CM

## 2020-05-09 DIAGNOSIS — F32A Depression, unspecified: Secondary | ICD-10-CM

## 2020-05-09 DIAGNOSIS — O99893 Other specified diseases and conditions complicating puerperium: Secondary | ICD-10-CM

## 2020-05-09 DIAGNOSIS — R519 Headache, unspecified: Secondary | ICD-10-CM | POA: Diagnosis not present

## 2020-05-09 DIAGNOSIS — O99340 Other mental disorders complicating pregnancy, unspecified trimester: Secondary | ICD-10-CM

## 2020-05-09 DIAGNOSIS — O99345 Other mental disorders complicating the puerperium: Secondary | ICD-10-CM

## 2020-05-09 MED ORDER — KETOROLAC TROMETHAMINE 30 MG/ML IJ SOLN
30.0000 mg | Freq: Once | INTRAMUSCULAR | Status: AC
Start: 2020-05-09 — End: 2020-05-09
  Administered 2020-05-09: 30 mg via INTRAMUSCULAR

## 2020-05-09 MED ORDER — KETOROLAC TROMETHAMINE 30 MG/ML IJ SOLN
30.0000 mg | Freq: Once | INTRAMUSCULAR | Status: DC
Start: 2020-05-09 — End: 2020-05-09

## 2020-05-09 MED ORDER — MEDROXYPROGESTERONE ACETATE 150 MG/ML IM SUSP: 150.0000 mg | INTRAMUSCULAR | 3 refills | Status: DC

## 2020-05-09 MED ORDER — MEDROXYPROGESTERONE ACETATE 150 MG/ML IM SUSP
150.0000 mg | Freq: Once | INTRAMUSCULAR | Status: AC
  Administered 2020-05-09: 150 mg via INTRAMUSCULAR

## 2020-05-09 MED ORDER — SERTRALINE HCL 50 MG PO TABS: 25.0000 mg | ORAL_TABLET | Freq: Every day | ORAL | 3 refills | Status: DC

## 2020-05-09 NOTE — Progress Notes (Signed)
[place] Partum Visit Note  Vicki Harper is a 24 y.o. G35P1001 female who presents for a postpartum visit. She is 4 weeks postpartum following a normal spontaneous vaginal delivery.  I have fully reviewed the prenatal and intrapartum course. The delivery was at 40.3 gestational weeks.  Anesthesia: epidural. Postpartum course has been complicated by headaches. Baby is doing well. Baby is feeding by breast. Bleeding no bleeding. Bowel function is normal. Bladder function is normal. Patient is not sexually active. Contraception method is Depo-Provera injections. Postpartum depression screening: positive score23  Has migraine for 4 days.  The pregnancy intention screening data noted above was reviewed. Potential methods of contraception were discussed. The patient elected to proceed with Hormonal Injection.    Edinburgh Postnatal Depression Scale - 05/09/20 1331      Edinburgh Postnatal Depression Scale:  In the Past 7 Days   I have been able to laugh and see the funny side of things. 1    I have looked forward with enjoyment to things. 2    I have blamed myself unnecessarily when things went wrong. 3    I have been anxious or worried for no good reason. 3    I have felt scared or panicky for no good reason. 3    Things have been getting on top of me. 2    I have been so unhappy that I have had difficulty sleeping. 3    I have felt sad or miserable. 3    I have been so unhappy that I have been crying. 3    The thought of harming myself has occurred to me. 0    Edinburgh Postnatal Depression Scale Total 23            The following portions of the patient's history were reviewed and updated as appropriate: allergies, current medications, past family history, past medical history, past social history, past surgical history and problem list.  Review of Systems Pertinent items are noted in HPI.    Objective:  BP 123/68   Pulse 65   Wt 139 lb (63 kg)   LMP 07/05/2019    Breastfeeding Yes   BMI 22.44 kg/m    General:  alert, cooperative and no distress  Lungs: clear to auscultation bilaterally  Heart:  regular rate and rhythm, S1, S2 normal, no murmur, click, rub or gallop  Abdomen: soft, non-tender; bowel sounds normal; no masses,  no organomegaly        Assessment:    Normal postpartum exam. Pap smear not done at today's visit.   Plan:   Essential components of care per ACOG recommendations:  1.  Mood and well being: Patient with positive depression screening today. Reviewed local resources for support. Increase zoloft to 50mg . Return in 3 weeks. - Patient does use tobacco.  - hx of drug use? No    2. Infant care and feeding:  -Patient currently breastmilk feeding? Yes  -Social determinants of health (SDOH) reviewed in EPIC. No concerns  3. Sexuality, contraception and birth spacing - Patient does not want a pregnancy in the next year.   - Reviewed forms of contraception in tiered fashion. Patient desired no method today.   - Discussed birth spacing of 18 months  4. Sleep and fatigue -Encouraged family/partner/community support of 4 hrs of uninterrupted sleep to help with mood and fatigue  5. Physical Recovery  - Discussed patients delivery and complications - Patient had a labial degree laceration, perineal healing reviewed. Patient  expressed understanding - Patient has urinary incontinence? No - Patient is safe to resume physical and sexual activity  6.  Health Maintenance - Last pap smear done 09/2019 and was abnormal with LSIL   7. Migraine Toradol injection given. - PCP follow up  [place] [facility] [place]

## 2020-05-10 LAB — CBC
Hematocrit: 40 % (ref 34.0–46.6)
Hemoglobin: 12.9 g/dL (ref 11.1–15.9)
MCH: 27.6 pg (ref 26.6–33.0)
MCHC: 32.3 g/dL (ref 31.5–35.7)
MCV: 86 fL (ref 79–97)
Platelets: 265 10*3/uL (ref 150–450)
RBC: 4.68 x10E6/uL (ref 3.77–5.28)
RDW: 18.5 % — ABNORMAL HIGH (ref 11.7–15.4)
WBC: 4.5 10*3/uL (ref 3.4–10.8)

## 2020-05-10 LAB — TSH: TSH: 0.134 u[IU]/mL — ABNORMAL LOW (ref 0.450–4.500)

## 2020-05-14 LAB — SPECIMEN STATUS REPORT

## 2020-05-14 LAB — T3, FREE: T3, Free: 3.3 pg/mL (ref 2.0–4.4)

## 2020-05-14 LAB — T4, FREE: Free T4: 1.2 ng/dL (ref 0.82–1.77)

## 2020-05-16 NOTE — BH Specialist Note (Addendum)
Integrated Behavioral Health via Telemedicine Visit  05/16/2020 Lanier Felty [id number]  Number of [place] visits: 6 Session Start time: 3:36  Session End time: 4:25 Total time: 22  Referring Provider: Hansel Feinstein, CNM Patient/Family location: Home [facility] Provider location: [facility] [place] [facility]  All persons participating in visit: Patient Vicki Harper and [place]  Types of Service: Telephone visit  I connected with [name] and/or [name]'s n/a by Telephone and verified that I am speaking with the correct person using two identifiers.    Discussed confidentiality: Yes   I discussed the limitations of telemedicine and the availability of in person appointments.  Discussed there is a possibility of technology failure and discussed alternative modes of communication if that failure occurs.  I discussed that engaging in this telemedicine visit, they consent to the provision of behavioral healthcare and the services will be billed under their insurance.  Patient and/or legal guardian expressed understanding and consented to Telemedicine visit: Yes   Presenting Concerns: Patient and/or family reports the following symptoms/concerns: Pt's primary concern today is feeling stress over baby's ongoing fussiness at night; has been taking increased Zoloft to 50mg  with no side effects.  Duration of problem: Postpartum; Severity of problem: moderate  Patient and/or Family's Strengths/Protective Factors: Social connections, Concrete supports in place (healthy food, safe environments, etc.) and Sense of purpose  Goals Addressed: Patient will: 1.  Reduce symptoms of: anxiety, depression and stress  2.  Increase knowledge and/or ability of: stress reduction  3.  Demonstrate ability to: Increase healthy adjustment to current life circumstances and Increase adequate support systems for  patient/family  Progress towards Goals: Ongoing  Interventions: Interventions utilized:  Solution-Focused Strategies Standardized Assessments completed: GAD-7 and PHQ 9  Patient and/or Family Response: Pt agrees to revised treatment plan  Assessment: Patient currently experiencing Adjustment disorder with mixed anxiety and depressed mood.   Patient may benefit from continued psychoeducation and brief therapeutic interventions regarding coping with symptoms of depression and anxiety .  Plan: 1. Follow up with behavioral health clinician on : Two weeks 2. Behavioral recommendations:  -Continue taking Zoloft as prescribed -View video clips online (Happiest baby on the block tips for soothing fussy babies); try out steps the first time when baby is not fussy yet; practice daily for two weeks, to see if it helps -Continue to consider registering for and attending new mom support group at www.conehealthybaby.com OR www.postpartum.net -Continue using baby calming steps (gripe water, warm baths, etc.) as recommended by baby's pediatrician  3. Referral(s): [place] (In Clinic) and [employer]:  new mom support  I discussed the assessment and treatment plan with the patient and/or parent/guardian. They were provided an opportunity to ask questions and all were answered. They agreed with the plan and demonstrated an understanding of the instructions.   They were advised to call back or seek an in-person evaluation if the symptoms worsen or if the condition fails to improve as anticipated.  Garlan Fair, LCSW   Depression screen [facility] 2/9 05/28/2020 05/07/2020 03/11/2020 01/01/2020 12/18/2019  Decreased Interest 1 1 0 1 1  Down, Depressed, Hopeless 2 2 2 1 2   PHQ - 2 Score 3 3 2 2 3   Altered sleeping 1 1 0 0 0  Tired, decreased energy 1 2 3 1 2   Change in appetite 0 0 0 0 0  Feeling bad or failure about yourself  1 1 1  1 2  Trouble concentrating 0 0 0  0 0  Moving slowly or fidgety/restless 0 0 0 0 0  Suicidal thoughts 0 1 0 0 1  PHQ-9 Score 6 8 6 4 8    GAD 7 : Generalized Anxiety Score 05/28/2020 05/07/2020 03/11/2020 01/01/2020  Nervous, Anxious, on Edge 1 1 0 0  Control/stop worrying 1 2 3 1   Worry too much - different things 1 2 0 1  Trouble relaxing 1 1 0 0  Restless 0 0 0 0  Easily annoyed or irritable 2 2 3 2   Afraid - awful might happen 0 0 0 1  Total GAD 7 Score 6 8 6  5

## 2020-05-28 ENCOUNTER — Other Ambulatory Visit: Payer: Self-pay

## 2020-05-28 ENCOUNTER — Ambulatory Visit (INDEPENDENT_AMBULATORY_CARE_PROVIDER_SITE_OTHER): Payer: Medicaid Other | Admitting: Clinical

## 2020-05-28 DIAGNOSIS — F4323 Adjustment disorder with mixed anxiety and depressed mood: Secondary | ICD-10-CM

## 2020-05-30 ENCOUNTER — Encounter: Payer: Self-pay | Admitting: Family Medicine

## 2020-05-30 ENCOUNTER — Other Ambulatory Visit: Payer: Self-pay

## 2020-05-30 ENCOUNTER — Ambulatory Visit (INDEPENDENT_AMBULATORY_CARE_PROVIDER_SITE_OTHER): Payer: Medicaid Other | Admitting: Family Medicine

## 2020-05-30 VITALS — BP 107/65 | HR 65 | Wt 142.0 lb

## 2020-05-30 DIAGNOSIS — N61 Mastitis without abscess: Secondary | ICD-10-CM | POA: Diagnosis not present

## 2020-05-30 DIAGNOSIS — F32A Depression, unspecified: Secondary | ICD-10-CM

## 2020-05-30 DIAGNOSIS — O99345 Other mental disorders complicating the puerperium: Secondary | ICD-10-CM | POA: Diagnosis not present

## 2020-05-30 DIAGNOSIS — F53 Postpartum depression: Secondary | ICD-10-CM

## 2020-05-30 DIAGNOSIS — O99340 Other mental disorders complicating pregnancy, unspecified trimester: Secondary | ICD-10-CM

## 2020-05-30 DIAGNOSIS — G43009 Migraine without aura, not intractable, without status migrainosus: Secondary | ICD-10-CM | POA: Diagnosis not present

## 2020-05-30 MED ORDER — AMOXICILLIN 875 MG PO TABS: 875.0000 mg | ORAL_TABLET | Freq: Two times a day (BID) | ORAL | 0 refills | Status: AC

## 2020-05-30 MED ORDER — SERTRALINE HCL 50 MG PO TABS: 100.0000 mg | ORAL_TABLET | Freq: Every day | ORAL | 3 refills | Status: DC

## 2020-05-30 NOTE — Progress Notes (Signed)
   Subjective:    Patient ID: Vicki Harper, female    DOB: [birth year], 24 y.o.   MRN: [id number]  HPI Seen for follow up of PPD. Initially scored at 23. Now 16. Has been on zoloft - increased to 50mg  daily last visit (about 3 weeks ago). Feels that it helps. Still gets frustrated with things, but also has good days.  Has noticed some lumps and sore areas in breasts, doesn't go away with breastfeeding or pumping.  Getting migraines frequently. Would like to see neurologist.   Flavia Shipper Postnatal Depression Scale - 05/30/20 1313      Edinburgh Postnatal Depression Scale:  In the Past 7 Days   I have been able to laugh and see the funny side of things. 1    I have looked forward with enjoyment to things. 1    I have blamed myself unnecessarily when things went wrong. 2    I have been anxious or worried for no good reason. 2    I have felt scared or panicky for no good reason. 2    Things have been getting on top of me. 2    I have been so unhappy that I have had difficulty sleeping. 1    I have felt sad or miserable. 2    I have been so unhappy that I have been crying. 3    The thought of harming myself has occurred to me. 0    Edinburgh Postnatal Depression Scale Total 16             Review of Systems     Objective:   Physical Exam Vitals reviewed. Exam conducted with a chaperone present.  Constitutional:      Appearance: Normal appearance.  Chest:    Neurological:     Mental Status: She is alert.           Assessment & Plan:  1. Postpartum depression Increase to 100mg  daily. F/u in 4 weeks  2. Migraine without aura and without status migrainosus, not intractable Refer to neurologist  3. Mastitis in female Amoxicillin 875 BID.

## 2020-06-05 NOTE — BH Specialist Note (Signed)
Integrated Behavioral Health via Telemedicine Visit  06/05/2020 Vicki Harper [id number]  Number of Integrated Behavioral Health visits: 7 Session Start time: 9:55  Session End time: 10:05 Total time: 10  Referring Provider: Wynelle Bourgeois, CNM Patient/Family location: Home [facility] Provider location: Center for [facility]  All persons participating in visit: Patient Vicki Harper and [facility] Vicki Harper   Types of Service: Individual psychotherapy  I connected with Vicki Harper and/or Vicki Harper's n/a by video  (Video is Caregility application) and verified that I am speaking with the correct person using two identifiers.Discussed confidentiality: Yes   I discussed the limitations of telemedicine and the availability of in person appointments.  Discussed there is a possibility of technology failure and discussed alternative modes of communication if that failure occurs.  I discussed that engaging in this telemedicine visit, they consent to the provision of behavioral healthcare and the services will be billed under their insurance.  Patient and/or legal guardian expressed understanding and consented to Telemedicine visit: Yes   Presenting Concerns: Patient and/or family reports the following symptoms/concerns: Pt states she is taking Zoloft 50mg , has been reassured after viewing Happiest Baby on the Block video clips that she is doing all she can do to help soothe baby, and sleep is beginning to improve for pt and baby; feeling hopeful, with no additional concerns.  Duration of problem: Postpartum; Severity of problem: mild  Patient and/or Family's Strengths/Protective Factors: Social connections, Concrete supports in place (healthy food, safe environments, etc.), Sense of purpose and Physical Health (exercise, healthy diet, medication compliance, etc.)  Goals Addressed: Patient will: 1.  Maintain reduction of symptoms  of: anxiety, depression and stress   Progress towards Goals: Ongoing  Interventions: Interventions utilized:  Medication Monitoring and Supportive Counseling Standardized Assessments completed: Not Needed  Patient and/or Family Response: Pt agrees to revised treatment plan  Assessment: Patient currently experiencing Adjustment disorder with mixed anxiety and depressed mood.   Patient may benefit from supportive counseling today.  Plan: 1. Follow up with behavioral health clinician on : As needed, call at [phone] 2. Behavioral recommendations:  -Continue taking Zoloft as prescribed; talk to PCP about taking over medication management at next PCP visit -Continue using soothing strategies to help with fussy baby; remember that you are doing the best you can, and that fussiness will improve over time -Continue to consider registering for new mom support group as needed at either www.postpartum.net or www.conehealthybaby.com  3. Referral(s): Integrated [phone] (In Clinic)  I discussed the assessment and treatment plan with the patient and/or parent/guardian. They were provided an opportunity to ask questions and all were answered. They agreed with the plan and demonstrated an understanding of the instructions.   They were advised to call back or seek an in-person evaluation if the symptoms worsen or if the condition fails to improve as anticipated.  [employer], LCSW

## 2020-06-12 ENCOUNTER — Ambulatory Visit: Payer: Medicaid Other | Admitting: Clinical

## 2020-06-12 DIAGNOSIS — F4323 Adjustment disorder with mixed anxiety and depressed mood: Secondary | ICD-10-CM

## 2020-06-12 NOTE — Patient Instructions (Signed)
[facility] [address] [place], [place] [address] [phone] (main office) [phone] (Antoinne Spadaccini's office)   

## 2020-07-04 ENCOUNTER — Ambulatory Visit: Payer: Medicaid Other | Admitting: Family Medicine

## 2020-07-08 ENCOUNTER — Other Ambulatory Visit: Payer: Self-pay | Admitting: Family Medicine

## 2020-07-15 ENCOUNTER — Ambulatory Visit (INDEPENDENT_AMBULATORY_CARE_PROVIDER_SITE_OTHER): Payer: Medicaid Other | Admitting: Clinical

## 2020-07-15 ENCOUNTER — Other Ambulatory Visit: Payer: Self-pay

## 2020-07-15 DIAGNOSIS — F331 Major depressive disorder, recurrent, moderate: Secondary | ICD-10-CM | POA: Insufficient documentation

## 2020-07-15 NOTE — Progress Notes (Signed)
Comprehensive Clinical Assessment (CCA) Note  07/15/2020 Vicki Harper [id number]  Virtual Visit via Video Note  I connected with Vicki Harper on 07/15/20 at  9:00 AM EST by a video enabled telemedicine application and verified that I am speaking with the correct person using two identifiers.  Location: Patient: home Provider: office   I discussed the limitations of evaluation and management by telemedicine and the availability of in person appointments. The patient expressed understanding and agreed to proceed.  Follow Up Instructions: I discussed the assessment and treatment plan with the patient. The patient was provided an opportunity to ask questions and all were answered. The patient agreed with the plan and demonstrated an understanding of the instructions.   The patient was advised to call back or seek an in-person evaluation if the symptoms worsen or if the condition fails to improve as anticipated.  I provided 45 minutes of non-face-to-face time during this encounter.   Bernestine Amass, LCSW   Chief Complaint:  Chief Complaint  Patient presents with  . Depression   Visit Diagnosis:  Major depressive, disorder, recurrent episode, moderate   Interpretive Summary: Client is a 25 year old female presenting to [facility] for outpatient behavioral health services. Client is referred by her OBGYN for a clinical assessment. Client presents with the chief complaint of major depression and most recently postpartum depression after having her baby in November 2021. Client reported "sometimes I feel like a failure, feeling hard on myself, feeling very emotional and out of it, not having energy to do things I'm used to doing or wanting to do anything at all". Client reported one situation of suicidal ideation with plan but did not act on it. Client reported her OBGYN has her symptoms being managed with Zoloft. Client reported she does not take it as prescribed because she does  not want to be dependent on a medication to help her feel better. Client denied prior outpatient and/ or inpatient treatments for her mental health.  Client presented oriented times five, appropriately dressed, and friendly. Client denied hallucinations, delusions, suicidal and homicidal ideations. Client was screened for the following SDOH: Flowsheet Row Counselor from 07/15/2020 in [facility]  PHQ-9 Total Score 12      Treatment recommendations: individual therapy, psychiatric evaluation and medication management.  Therapist provided information on format of appointment (virtual or face to face).  The client was advised to call back or seek an in-person evaluation if the symptoms worsen or if the condition fails to improve as anticipated before the next scheduled appointment. Client was in agreement with treatment recommendations.    CCA Biopsychosocial Intake/Chief Complaint:  Client reported she was referred by her OBGYN to continue working on postpartum depression and depression that she has dealt with her for the past six years  Current Symptoms/Problems: Client reported "sometimes I feel like a failure ,feeling hard on myself, feeling very emotional and out of it, not having energy to do things I'm use to doing or wanting to do anything at all".   Patient Reported Schizophrenia/Schizoaffective Diagnosis in Past: No  Type of Services Patient Feels are Needed: Individual therapy, psychiatric evaluation and medication management   Initial Clinical Notes/Concerns: No data recorded  Mental Health Symptoms Depression:  Change in energy/activity; Difficulty Concentrating; Hopelessness; Increase/decrease in appetite; Sleep (too much or little); Tearfulness; Worthlessness   Duration of Depressive symptoms: Greater than two weeks   Mania:  None   Anxiety:   None   Psychosis:  None  Duration of Psychotic symptoms: No data recorded  Trauma:  None    Obsessions:  None   Compulsions:  None   Inattention:  None   Hyperactivity/Impulsivity:  N/A   Oppositional/Defiant Behaviors:  None   Emotional Irregularity:  None   Other Mood/Personality Symptoms:  No data recorded   Mental Status Exam Appearance and self-care  Stature:  Average   Weight:  Average weight   Clothing:  Casual   Grooming:  Normal   Cosmetic use:  Age appropriate   Posture/gait:  Normal   Motor activity:  Not Remarkable   Sensorium  Attention:  Normal   Concentration:  Normal   Orientation:  X5   Recall/memory:  Normal   Affect and Mood  Affect:  Congruent   Mood:  Euthymic   Relating  Eye contact:  Normal   Facial expression:  Responsive   Attitude toward examiner:  Cooperative   Thought and Language  Speech flow: Clear and Coherent   Thought content:  Appropriate to Mood and Circumstances   Preoccupation:  None   Hallucinations:  None   Organization:  No data recorded  [employer] of Knowledge:  Good   Intelligence:  Average   Abstraction:  Normal   Judgement:  Good   Reality Testing:  Adequate   Insight:  Good   Decision Making:  Normal   Social Functioning  Social Maturity:  Responsible   Social Judgement:  Normal   Stress  Stressors:  Relationship; Work   Coping Ability:  Normal   Skill Deficits:  Activities of daily living; Self-care   Supports:  Family     Religion: Religion/Spirituality Are You A Religious Person?: No  Leisure/Recreation: Leisure / Recreation Do You Have Hobbies?: No  Exercise/Diet: Exercise/Diet Do You Exercise?: No Have You Gained or Lost A Significant Amount of Weight in the Past Six Months?: No Do You Follow a Special Diet?: No Do You Have Any Trouble Sleeping?: Yes   CCA Employment/Education Employment/Work Situation: Employment / Work Situation Employment situation: Unemployed  Education: Education Did Teacher, adult education From [employer]?:  Yes Did Physicist, medical?: Yes What Type of College Degree Do you Have?: Bachelors   CCA Family/Childhood History Family and Relationship History: Family history Marital status: Single Long term relationship, how long?: over a year What types of issues is patient dealing with in the relationship?: Client reported she and her childs father live together but he has not been supporting her and/or tending to their newborn daughter which has caused her stress. Does patient have children?: Yes How many children?: 1 How is patient's relationship with their children?: Client reported she gave birth to her daughter in November 2021.  Childhood History:  Childhood History By whom was/is the patient raised?: Both parents Additional childhood history information: Client reported she is from [place]; client was raised by her mother and father. Client reported her childhood was normal. Client reported when her parents got divorced when she was young, splitting time with them was difficult. Good relationship currently. No abuse during childhood. Does patient have siblings?: Yes Did patient suffer any verbal/emotional/physical/sexual abuse as a child?: No Did patient suffer from severe childhood neglect?: No Has patient ever been sexually abused/assaulted/raped as an adolescent or adult?: No Was the patient ever a victim of a crime or a disaster?: No Witnessed domestic violence?: No Has patient been affected by domestic violence as an adult?: No  Child/Adolescent Assessment:     CCA Substance  Use Alcohol/Drug Use: Alcohol / Drug Use History of alcohol / drug use?: No history of alcohol / drug abuse                         ASAM's:  Six Dimensions of Multidimensional Assessment  Dimension 1:  Acute Intoxication and/or Withdrawal Potential:      Dimension 2:  Biomedical Conditions and Complications:      Dimension 3:  Emotional, Behavioral, or Cognitive Conditions and  Complications:     Dimension 4:  Readiness to Change:     Dimension 5:  Relapse, Continued use, or Continued Problem Potential:     Dimension 6:  Recovery/Living Environment:     ASAM Severity Score:    ASAM Recommended Level of Treatment:     Substance use Disorder (SUD)    Recommendations for Services/Supports/Treatments: Recommendations for Services/Supports/Treatments Recommendations For Services/Supports/Treatments: Medication Management,Individual Therapy  DSM5 Diagnoses: Patient Active Problem List   Diagnosis Date Noted  . LGSIL on Pap smear of cervix 09/14/2019  . Chronic pain of left knee 11/23/2014    Patient Centered Plan: Patient is on the following Treatment Plan(s):  Depression   Referrals to Alternative Service(s): Referred to Alternative Service(s):   Place:   Date:   Time:    Referred to Alternative Service(s):   Place:   Date:   Time:    Referred to Alternative Service(s):   Place:   Date:   Time:    Referred to Alternative Service(s):   Place:   Date:   Time:     Bernestine Amass, LCSW

## 2020-07-19 ENCOUNTER — Other Ambulatory Visit: Payer: Self-pay

## 2020-07-19 MED ORDER — METRONIDAZOLE 500 MG PO TABS: 500.0000 mg | ORAL_TABLET | Freq: Two times a day (BID) | ORAL | 0 refills | Status: DC

## 2020-08-01 ENCOUNTER — Other Ambulatory Visit: Payer: Self-pay

## 2020-08-01 ENCOUNTER — Ambulatory Visit (INDEPENDENT_AMBULATORY_CARE_PROVIDER_SITE_OTHER): Payer: Medicaid Other | Admitting: Clinical

## 2020-08-01 DIAGNOSIS — F331 Major depressive disorder, recurrent, moderate: Secondary | ICD-10-CM | POA: Diagnosis not present

## 2020-08-04 NOTE — Progress Notes (Signed)
   THERAPIST PROGRESS NOTE Virtual Visit via Video Note  I connected with Vicki Harper on 08/01/2020 at  3:00 PM EST by a video enabled telemedicine application and verified that I am speaking with the correct person using two identifiers.  Location: Patient: community Provider: office   I discussed the limitations of evaluation and management by telemedicine and the availability of in person appointments. The patient expressed understanding and agreed to proceed.   Follow Up Instructions:    I discussed the assessment and treatment plan with the patient. The patient was provided an opportunity to ask questions and all were answered. The patient agreed with the plan and demonstrated an understanding of the instructions.   The patient was advised to call back or seek an in-person evaluation if the symptoms worsen or if the condition fails to improve as anticipated.   Session Time: 30 minutes  Participation Level: Active  Behavioral Response: CasualAlertDepressed  Type of Therapy: Individual Therapy  Treatment Goals addressed: Diagnosis: depression  Interventions: CBT  Summary:  Vicki Harper is a 25 y.o. female who presents for the scheduled session oriented times five, appropriately dressed, and friendly. Client denied hallucinations and delusions. Client reported on today she has been doing good and bad. Client reported her relationship with her daughters father has worsened. Client reported he constantly reminds her of lack of financial support because she is not working. Client reported she also found out he was cheating on her during the relationship and he is now denying paternity of the baby. Client reported she is unable to move in with other family members because of their life situations. 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 Client reported her education is in criminal justice and is hoping to finally work in the field. Client reported her  family has been supportive of the decision. Client reported she is waiting to hear back from them and them interview went well.     Suicidal/Homicidal: Nowithout intent/plan  Therapist Response:  Therapist began the session asking the client how she has been doing since the last session. Therapist actively listened to the clients thoughts and feelings. Therapist used CBT to engage with the client in conversation about using her family support and acts of resilience to help alleviate depressive thoughts and feelings. Therapist assigned the client homework to continue working on looking at the positives and not focusing on the negatives. Client was scheduled for next appointment.    Plan: Return again in 5 weeks for individual therapy.  Diagnosis: Major depressive disorder, recurrent episode, moderate  Lazer Wollard Y Sherlyn Ebbert, LCSW 08/01/2020

## 2020-08-19 ENCOUNTER — Other Ambulatory Visit: Payer: Self-pay

## 2020-08-19 ENCOUNTER — Other Ambulatory Visit (HOSPITAL_BASED_OUTPATIENT_CLINIC_OR_DEPARTMENT_OTHER): Payer: Self-pay | Admitting: Obstetrics & Gynecology

## 2020-08-19 ENCOUNTER — Ambulatory Visit (INDEPENDENT_AMBULATORY_CARE_PROVIDER_SITE_OTHER): Payer: Medicaid Other

## 2020-08-19 VITALS — BP 114/69 | HR 63

## 2020-08-19 DIAGNOSIS — Z3042 Encounter for surveillance of injectable contraceptive: Secondary | ICD-10-CM | POA: Diagnosis not present

## 2020-08-19 MED ORDER — MEDROXYPROGESTERONE ACETATE 150 MG/ML IM SUSP
150.0000 mg | Freq: Once | INTRAMUSCULAR | Status: AC
  Administered 2020-08-19: 150 mg via INTRAMUSCULAR

## 2020-08-19 MED ORDER — MEDROXYPROGESTERONE ACETATE 150 MG/ML IM SUSP: 150.0000 mg | INTRAMUSCULAR | 3 refills | Status: DC

## 2020-08-19 NOTE — Telephone Encounter (Signed)
Patient needs refill on her birth control. Kathrene Alu RN

## 2020-08-19 NOTE — Progress Notes (Addendum)
Patient presents for Depo Provera shot. Kathrene Alu RN   Attestation of Attending Supervision of RN: Evaluation and management procedures were performed by the nurse under my supervision and collaboration.  I have reviewed the nursing note and chart, and I agree with the management and plan.  Carolyn L. Harraway-Smith, M.D., Cherlynn June

## 2020-08-30 ENCOUNTER — Telehealth (INDEPENDENT_AMBULATORY_CARE_PROVIDER_SITE_OTHER): Payer: Medicaid Other | Admitting: Psychiatry

## 2020-08-30 ENCOUNTER — Encounter (HOSPITAL_COMMUNITY): Payer: Self-pay | Admitting: Psychiatry

## 2020-08-30 ENCOUNTER — Other Ambulatory Visit: Payer: Self-pay

## 2020-08-30 DIAGNOSIS — O99340 Other mental disorders complicating pregnancy, unspecified trimester: Secondary | ICD-10-CM | POA: Diagnosis not present

## 2020-08-30 DIAGNOSIS — F32A Depression, unspecified: Secondary | ICD-10-CM | POA: Diagnosis not present

## 2020-08-30 MED ORDER — SERTRALINE HCL 25 MG PO TABS: 25.0000 mg | ORAL_TABLET | Freq: Every day | ORAL | 2 refills | Status: DC

## 2020-08-30 NOTE — Progress Notes (Signed)
Psychiatric Initial Adult Assessment  Virtual Visit via Video Note  I connected with Vicki Harper on 08/30/20 at 10:00 AM EDT by a video enabled telemedicine application and verified that I am speaking with the correct person using two identifiers.  Location: Patient: Home Provider: Clinic   I discussed the limitations of evaluation and management by telemedicine and the availability of in person appointments. The patient expressed understanding and agreed to proceed.  I provided 45 minutes of non-face-to-face time during this encounter.    Patient Identification: Vicki Harper MRN:  [id number] Date of Evaluation:  08/30/2020 Referral Source: Gara Kroner, Counselor Chief Complaint:  "I have post partum depression" Visit Diagnosis:    ICD-10-CM   1. Depression affecting pregnancy, antepartum  O99.340 sertraline (ZOLOFT) 25 MG tablet   F32.A     History of Present Illness:  25 year old female seen today for initial psychiatric evaluation. She was referred to outpatient psychiatry by her counselor for medication management. She has a history of depression. Currently she is managed on Zoloft 100 mg. Patient notes that her previous MD gave her four 25 mg tablets to equal 100 mg. She notes that she has only been taking Zoloft 25 mg and notes that she has not taken it for over a month.  Today she is well groomed, pleasant, cooperative, engaged in conversation, and maintained eye contact. She notes that her anxiety and depression is situational. She notes that the father of her child and her were not seeing eye to eye. She notes they are no longer together and reports she is feeling a lot better. She notes she only takes Zoloft periodically and reports that it has been over a month since she took her last dose. Today provider conducted a GAD 7 and a PHQ 9 and patient scored a 5 on both. She notes at times she is fearful of her ex making life hard for her. She notes that her sleep  is poor, noting she sleeps 4 hours at times. Today she denies SI/HI/AH. She notes she does have VH noting that while driving she sees animals that are not there.  Patient notes that at times she feels guilty. She notes that she was not as prepared as she wanted to be as a mother. She however notes that's he is striving to do better for her and her child. 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 She notes she has a degree in criminal justice and wants to put it into effect.  Patient notes that recently she has been having more migraines. She notes that her neurologist placed her on Topamax. She also notes that she follow up with her Neurologist on April 18th for further evaluation.     Today she request that Zoloft 25 mg be refilled. Provider informed patient that she can discontinue the medication if she no longer needs it. She endorsed understanding and agreed. She will follow up with outpatient counseling for therapy. No other concerns noted at this time.     Associated Signs/Symptoms: Depression Symptoms:  depressed mood, insomnia, fatigue, feelings of worthlessness/guilt, impaired memory, anxiety, disturbed sleep, (Hypo) Manic Symptoms:  Elevated Mood, Flight of Ideas, Hallucinations, Irritable Mood, Anxiety Symptoms:  Denies Psychotic Symptoms:  Hallucinations: Visual PTSD Symptoms: NA  Past Psychiatric History:Depression  Previous Psychotropic Medications: Zoloft  Substance Abuse History in the last 12 months:  No.  Consequences of Substance Abuse: NA  Past Medical History:  Past Medical History:  Diagnosis Date  . Anxiety   . Asthma   . Back pain   . Depression   . Headache     Past Surgical History:  Procedure Laterality Date  . NO PAST SURGERIES      Family Psychiatric History: Denies  Family History:  Family History  Problem Relation Age of Onset  . Migraines Mother     Social History:   Social History   Socioeconomic  History  . Marital status: Single    Spouse name: Not on file  . Number of children: Not on file  . Years of education: Not on file  . Highest education level: Not on file  Occupational History  . Not on file  Tobacco Use  . Smoking status: Never Smoker  . Smokeless tobacco: Never Used  Vaping Use  . Vaping Use: Never used  Substance and Sexual Activity  . Alcohol use: No    Alcohol/week: 0.0 standard drinks  . Drug use: No  . Sexual activity: Yes    Birth control/protection: None  Other Topics Concern  . Not on file  Social History Narrative  . Not on file   Social Determinants of Health   Financial Resource Strain: Not on file  Food Insecurity: Not on file  Transportation Needs: Not on file  Physical Activity: Not on file  Stress: Not on file  Social Connections: Not on file    Additional Social History: Patient resides in [place]. She is single and has a 29 month year old daughter. 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 She denies tobacco, alcohol, or illegal drug use.  Allergies:  Not on File  Metabolic Disorder Labs: No results found for: HGBA1C, MPG No results found for: PROLACTIN No results found for: CHOL, TRIG, HDL, CHOLHDL, VLDL, LDLCALC Lab Results  Component Value Date   TSH 0.134 (L) 05/09/2020    Therapeutic Level Labs: No results found for: LITHIUM No results found for: CBMZ No results found for: VALPROATE  Current Medications: Current Outpatient Medications  Medication Sig Dispense Refill  . ferrous sulfate (FERROUSUL) 325 (65 FE) MG tablet Take 1 tablet (325 mg total) by mouth daily with breakfast. 90 tablet 1  . fluticasone-salmeterol (ADVAIR HFA) 230-21 MCG/ACT inhaler Inhale 2 puffs into the lungs 2 (two) times daily.    . medroxyPROGESTERone (DEPO-PROVERA) 150 MG/ML injection Inject 1 mL (150 mg total) into the muscle every 3 (three) months. 1 mL 3  . metroNIDAZOLE (FLAGYL) 500 MG tablet Take 1 tablet (500 mg total) by  mouth 2 (two) times daily. 14 tablet 0  . sertraline (ZOLOFT) 25 MG tablet Take 1 tablet (25 mg total) by mouth daily. 30 tablet 2   No current facility-administered medications for this visit.    Musculoskeletal: Strength & Muscle Tone: Unable to assess due to telehealth visit [place]: Unable to assess due to telehealth visit Patient leans: N/A  Psychiatric Specialty Exam: Review of Systems  not currently breastfeeding.There is no height or weight on file to calculate BMI.  General Appearance: Well Groomed  Eye Contact:  Good  Speech:  Clear and Coherent and Normal Rate  Volume:  Normal  Mood:  Euthymic  Affect:  Appropriate and Congruent  Thought Process:  Coherent, Goal Directed and Linear  Orientation:  Full (Time, Place, and Person)  Thought Content:  Logical and Hallucinations: Visual  Suicidal Thoughts:  No  Homicidal Thoughts:  No  Memory:  Immediate;   Good Recent;   Good  Remote;   Good  Judgement:  Good  Insight:  Good  Psychomotor Activity:  Normal  Concentration:  Concentration: Good and Attention Span: Good  Recall:  Good  Fund of Knowledge:Good  Language: Good  Akathisia:  No  Handed:  Right  AIMS (if indicated): Not done  Assets:  Communication Skills Desire for Improvement Financial Resources/Insurance Housing Leisure Time Social Support Talents/Skills Vocational/Educational  ADL's:  Intact  Cognition: WNL  Sleep:  Poor   Screenings: GAD-7   Flowsheet Row Video Visit from 08/30/2020 in [place] from 05/28/2020 in [facility] for [facility] at [facility] for [place] from 05/07/2020 in Center for [employer] at [facility] for [place] from 03/11/2020 in Center for [employer] at [facility] for [place] from 01/01/2020 in [place] for [place] at [facility] for [facility]  Total GAD-7 Score 5 6 8 6 5     PHQ2-9   Flowsheet Row Video Visit from 08/30/2020 in [facility] Counselor from 07/15/2020 in [place] from 05/28/2020 in Center for [place] at [facility] for [place] from 05/07/2020 in Center for [place] at [facility] for [place] from 03/11/2020 in Center for [place] at [facility] for Women  PHQ-2 Total Score 1 3 3 3 2   PHQ-9 Total Score 5 12 6 8 6     Flowsheet Row Video Visit from 08/30/2020 in [place] from 05/07/2020 in [place] for [place] at [facility] for [place] from 12/18/2019 in Center for [place] at [facility] for [facility]  C-SSRS RISK CATEGORY No Risk Error: Q2 is Yes, you must answer 3, 4, and 5 Error: Q2 is Yes, you must answer 3, 4, and 5      Assessment and Plan: Patient notes that her anxiety and depression are situational. Today she request that Zoloft 25 mg be refilled. Provider informed patient that she can discontinue the medication if she no longer needs it. She endorsed understanding and agreed  1. Depression affecting pregnancy, antepartum  Continue- sertraline (ZOLOFT) 25 MG tablet; Take 1 tablet (25 mg total) by mouth daily.  Dispense: 30 tablet; Refill: 2  Follow up in 3 months Follow up with therapy  Salley Slaughter, NP 3/25/202210:01 AM

## 2020-09-02 ENCOUNTER — Ambulatory Visit (INDEPENDENT_AMBULATORY_CARE_PROVIDER_SITE_OTHER): Payer: Medicaid Other | Admitting: Clinical

## 2020-09-02 ENCOUNTER — Other Ambulatory Visit: Payer: Self-pay

## 2020-09-02 DIAGNOSIS — F331 Major depressive disorder, recurrent, moderate: Secondary | ICD-10-CM | POA: Diagnosis not present

## 2020-09-02 NOTE — Progress Notes (Signed)
   THERAPIST PROGRESS NOTE Virtual Visit via Video Note  I connected with [name] on 09/02/20 at  9:00 AM EDT by a video enabled telemedicine application and verified that I am speaking with the correct person using two identifiers.  Location: Patient: home Provider: office   I discussed the limitations of evaluation and management by telemedicine and the availability of in person appointments. The patient expressed understanding and agreed to proceed.   Follow Up Instructions: I discussed the assessment and treatment plan with the patient. The patient was provided an opportunity to ask questions and all were answered. The patient agreed with the plan and demonstrated an understanding of the instructions.   The patient was advised to call back or seek an in-person evaluation if the symptoms worsen or if the condition fails to improve as anticipated.   Session Time: 19 minutes  Participation Level: Active  Behavioral Response: CasualAlertEuthymic  Type of Therapy: Individual Therapy  Treatment Goals addressed: Diagnosis: depression  Interventions: CBT  Summary:  Marybeth Dandy is a 25 y.o. female who presents for the scheduled session oriented times five, appropriately dressed, and friendly. Client denied hallucinations and delusions. Client reported on today she is doing very well. Client reported since the last session a lot of things have turned around in a good way for her. 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 Client reported she has also been approved for an apartment and is going to look for her a car. Client reported everyone has been telling her that she is glowing lately. Client reported she has not told her child's father or his family about the things she has been working on for herself and likes it that way. Client reported her exchange with him and his family have been very unkind. Client reported  she feels better since accomplishing her goals and creating a better environment for her and her daughter. Client reported people are surprised by her hobbies of journaling, meditating, and yoga. Client reported she has benefited from utilizing time to clear her thoughts and stay in a positive mindset.     Suicidal/Homicidal: Nowithout intent/plan  Therapist Response:  Therapist began the session asking the client how she has been doing since she was last seen. Therapist actively listened to the clients thoughts and feelings. Therapist used CBT to ask open ended questions about what she has implemented in her daily activity that has helped to improve her mindset. Therapist used CBT to engage with the client to discuss how to maintain progress by utilizing supports, identifying small goals, and acknowledging progress. Client was scheduled for next appointments.  Plan: Return again in 5 weeks for individual therapy.  Diagnosis: Major depressive disorder, recurrent episode, moderate  Birdena Jubilee Santos Sollenberger, LCSW 09/02/2020

## 2020-09-23 ENCOUNTER — Telehealth: Payer: Self-pay | Admitting: Neurology

## 2020-09-23 ENCOUNTER — Encounter: Payer: Self-pay | Admitting: Neurology

## 2020-09-23 ENCOUNTER — Ambulatory Visit: Payer: Medicaid Other | Admitting: Neurology

## 2020-09-23 VITALS — BP 102/74 | HR 60 | Ht 66.0 in | Wt 147.5 lb

## 2020-09-23 DIAGNOSIS — R51 Headache with orthostatic component, not elsewhere classified: Secondary | ICD-10-CM

## 2020-09-23 DIAGNOSIS — R519 Headache, unspecified: Secondary | ICD-10-CM

## 2020-09-23 DIAGNOSIS — G43009 Migraine without aura, not intractable, without status migrainosus: Secondary | ICD-10-CM | POA: Diagnosis not present

## 2020-09-23 DIAGNOSIS — H579 Unspecified disorder of eye and adnexa: Secondary | ICD-10-CM

## 2020-09-23 DIAGNOSIS — H538 Other visual disturbances: Secondary | ICD-10-CM

## 2020-09-23 MED ORDER — RIZATRIPTAN BENZOATE 10 MG PO TBDP: 10.0000 mg | ORAL_TABLET | ORAL | 11 refills | Status: DC | PRN

## 2020-09-23 MED ORDER — TOPIRAMATE 100 MG PO TABS: 100.0000 mg | ORAL_TABLET | Freq: Every day | ORAL | 3 refills | Status: DC

## 2020-09-23 MED ORDER — ONDANSETRON 4 MG PO TBDP: 4.0000 mg | ORAL_TABLET | Freq: Three times a day (TID) | ORAL | 3 refills | Status: DC | PRN

## 2020-09-23 NOTE — Patient Instructions (Signed)
Acute/emergency: Rizatriptan and Ondansetron together. Please take one tablet at the onset of your headache. If it does not improve the symptoms please take one additional tablet. Do not take more then 2 tablets in 24hrs. Do not take use more then 2 to 3 times in a week.  Preventative: Increase topiramate to 100mg  just at bedtime. Discussed teratogenicity do not get pregnant.   Ophthalmology: Dr. Katy Fitch with get you in for examination this week  If this fails, consider Emgality or Qulipta as preventative(new medications on the market)  Topiramate tablets What is this medicine? TOPIRAMATE (toe PYRE a mate) is used to treat seizures in adults or children with epilepsy. It is also used for the prevention of migraine headaches. This medicine may be used for other purposes; ask your health care provider or pharmacist if you have questions. COMMON BRAND NAME(S): Topamax, Topiragen What should I tell my health care provider before I take this medicine? They need to know if you have any of these conditions:  bleeding disorder  kidney disease  lung disease  suicidal thoughts, plans, or attempt  an unusual or allergic reaction to topiramate, other medicines, foods, dyes, or preservatives  pregnant or trying to get pregnant  breast-feeding How should I use this medicine? Take this medicine by mouth with a glass of water. Follow the directions on the prescription label. Do not cut, crush or chew this medicine. Swallow the tablets whole. You can take it with or without food. If it upsets your stomach, take it with food. Take your medicine at regular intervals. Do not take it more often than directed. Do not stop taking except on your doctor's advice. A special MedGuide will be given to you by the pharmacist with each prescription and refill. Be sure to read this information carefully each time. Talk to your pediatrician regarding the use of this medicine in children. While this drug may be  prescribed for children as young as 7 years of age for selected conditions, precautions do apply. Overdosage: If you think you have taken too much of this medicine contact a poison control center or emergency room at once. NOTE: This medicine is only for you. Do not share this medicine with others. What if I miss a dose? If you miss a dose, take it as soon as you can. If your next dose is to be taken in less than 6 hours, then do not take the missed dose. Take the next dose at your regular time. Do not take double or extra doses. What may interact with this medicine? This medicine may interact with the following medications:  acetazolamide  alcohol  antihistamines for allergy, cough, and cold  aspirin and aspirin-like medicines  atropine  birth control pills  certain medicines for anxiety or sleep  certain medicines for bladder problems like oxybutynin, tolterodine  certain medicines for depression like amitriptyline, fluoxetine, sertraline  certain medicines for seizures like carbamazepine, phenobarbital, phenytoin, primidone, valproic acid, zonisamide  certain medicines for stomach problems like dicyclomine, hyoscyamine  certain medicines for travel sickness like scopolamine  certain medicines for Parkinson's disease like benztropine, trihexyphenidyl  certain medicines that treat or prevent blood clots like warfarin, enoxaparin, dalteparin, apixaban, dabigatran, and rivaroxaban  digoxin  general anesthetics like halothane, isoflurane, methoxyflurane, propofol  hydrochlorothiazide  ipratropium  lithium  medicines that relax muscles for surgery  metformin  narcotic medicines for pain  NSAIDs, medicines for pain and inflammation, like ibuprofen or naproxen  phenothiazines like chlorpromazine, mesoridazine, prochlorperazine, thioridazine  pioglitazone  This list may not describe all possible interactions. Give your health care provider a list of all the  medicines, herbs, non-prescription drugs, or dietary supplements you use. Also tell them if you smoke, drink alcohol, or use illegal drugs. Some items may interact with your medicine. What should I watch for while using this medicine? Visit your doctor or health care professional for regular checks on your progress. Tell your health care professional if your symptoms do not start to get better or if they get worse. Do not stop taking except on your health care professional's advice. You may develop a severe reaction. Your health care professional will tell you how much medicine to take. Wear a medical ID bracelet or chain. Carry a card that describes your disease and details of your medicine and dosage times. This medicine can reduce the response of your body to heat or cold. Dress warm in cold weather and stay hydrated in hot weather. If possible, avoid extreme temperatures like saunas, hot tubs, very hot or cold showers, or activities that can cause dehydration such as vigorous exercise. Check with your health care professional if you have severe diarrhea, nausea, and vomiting, or if you sweat a lot. The loss of too much body fluid may make it dangerous for you to take this medicine. You may get drowsy or dizzy. Do not drive, use machinery, or do anything that needs mental alertness until you know how this medicine affects you. Do not stand up or sit up quickly, especially if you are an older patient. This reduces the risk of dizzy or fainting spells. Alcohol may interfere with the effect of this medicine. Avoid alcoholic drinks. Tell your health care professional right away if you have any change in your eyesight. Patients and their families should watch out for new or worsening depression or thoughts of suicide. Also watch out for sudden changes in feelings such as feeling anxious, agitated, panicky, irritable, hostile, aggressive, impulsive, severely restless, overly excited and hyperactive, or not  being able to sleep. If this happens, especially at the beginning of treatment or after a change in dose, call your healthcare professional. This medicine may cause serious skin reactions. They can happen weeks to months after starting the medicine. Contact your health care provider right away if you notice fevers or flu-like symptoms with a rash. The rash may be red or purple and then turn into blisters or peeling of the skin. Or, you might notice a red rash with swelling of the face, lips or lymph nodes in your neck or under your arms. Birth control may not work properly while you are taking this medicine. Talk to your health care professional about using an extra method of birth control. Women should inform their health care professional if they wish to become pregnant or think they might be pregnant. There is a potential for serious side effects and harm to an unborn child. Talk to your health care professional for more information. What side effects may I notice from receiving this medicine? Side effects that you should report to your doctor or health care professional as soon as possible:  allergic reactions like skin rash, itching or hives, swelling of the face, lips, or tongue  blood in the urine  changes in vision  confusion  loss of memory  pain in lower back or side  pain when urinating  redness, blistering, peeling or loosening of the skin, including inside the mouth  signs and symptoms of bleeding such as bloody  or black, tarry stools; red or dark brown urine; spitting up blood or brown material that looks like coffee grounds; red spots on the skin; unusual bruising or bleeding from the eyes, gums, or nose  signs and symptoms of increased acid in the body like breathing fast; fast heartbeat; headache; confusion; unusually weak or tired; nausea, vomiting  suicidal thoughts, mood changes  trouble speaking or understanding  unusual sweating  unusually weak or tired Side  effects that usually do not require medical attention (report to your doctor or health care professional if they continue or are bothersome):  dizziness  drowsiness  fever  loss of appetite  nausea, vomiting  pain, tingling, numbness in the hands or feet  stomach pain  tiredness  upset stomach This list may not describe all possible side effects. Call your doctor for medical advice about side effects. You may report side effects to FDA at 1-800-FDA-1088. Where should I keep my medicine? Keep out of the reach of children and pets. Store between 15 and 30 degrees C (59 and 86 degrees F). Protect from moisture. Keep the container tightly closed. Get rid of any unused medicine after the expiration date. To get rid of medicines that are no longer needed or have expired:  Take the medicine to a medicine take-back program. Check with your pharmacy or law enforcement to find a location.  If you cannot return the medicine, check the label or package insert to see if the medicine should be thrown out in the garbage or flushed down the toilet. If you are not sure, ask your health care provider. If it is safe to put it in the trash, empty the medicine out of the container. Mix the medicine with cat litter, dirt, coffee grounds, or other unwanted substance. Seal the mixture in a bag or container. Put it in the trash. NOTE: This sheet is a summary. It may not cover all possible information. If you have questions about this medicine, talk to your doctor, pharmacist, or health care provider.  2021 [employer] Standard (2019-12-07 15:41:57) Ondansetron oral dissolving tablet What is this medicine? ONDANSETRON (on DAN se tron) is used to treat nausea and vomiting caused by chemotherapy. It is also used to prevent or treat nausea and vomiting after surgery. This medicine may be used for other purposes; ask your health care provider or pharmacist if you have questions. COMMON BRAND NAME(S): Zofran  ODT What should I tell my health care provider before I take this medicine? They need to know if you have any of these conditions:  heart disease  history of irregular heartbeat  liver disease  low levels of magnesium or potassium in the blood  an unusual or allergic reaction to ondansetron, granisetron, other medicines, foods, dyes, or preservatives  pregnant or trying to get pregnant  breast-feeding How should I use this medicine? These tablets are made to dissolve in the mouth. Do not try to push the tablet through the foil backing. With dry hands, peel away the foil backing and gently remove the tablet. Place the tablet in the mouth and allow it to dissolve, then swallow. While you may take these tablets with water, it is not necessary to do so. Talk to your pediatrician regarding the use of this medicine in children. Special care may be needed. Overdosage: If you think you have taken too much of this medicine contact a poison control center or emergency room at once. NOTE: This medicine is only for you. Do not share this  medicine with others. What if I miss a dose? If you miss a dose, take it as soon as you can. If it is almost time for your next dose, take only that dose. Do not take double or extra doses. What may interact with this medicine? Do not take this medicine with any of the following medications:  apomorphine  certain medicines for fungal infections like fluconazole, itraconazole, ketoconazole, posaconazole, voriconazole  cisapride  dronedarone  pimozide  thioridazine This medicine may also interact with the following medications:  carbamazepine  certain medicines for depression, anxiety, or psychotic disturbances  fentanyl  linezolid  MAOIs like Carbex, Eldepryl, Marplan, Nardil, and Parnate  methylene blue (injected into a vein)  other medicines that prolong the QT interval (cause an abnormal heart rhythm) like dofetilide,  ziprasidone  phenytoin  rifampicin  tramadol This list may not describe all possible interactions. Give your health care provider a list of all the medicines, herbs, non-prescription drugs, or dietary supplements you use. Also tell them if you smoke, drink alcohol, or use illegal drugs. Some items may interact with your medicine. What should I watch for while using this medicine? Check with your doctor or health care professional as soon as you can if you have any sign of an allergic reaction. What side effects may I notice from receiving this medicine? Side effects that you should report to your doctor or health care professional as soon as possible:  allergic reactions like skin rash, itching or hives, swelling of the face, lips, or tongue  breathing problems  confusion  dizziness  fast or irregular heartbeat  feeling faint or lightheaded, falls  fever and chills  loss of balance or coordination  seizures  sweating  swelling of the hands and feet  tightness in the chest  tremors  unusually weak or tired Side effects that usually do not require medical attention (report to your doctor or health care professional if they continue or are bothersome):  constipation or diarrhea  headache This list may not describe all possible side effects. Call your doctor for medical advice about side effects. You may report side effects to FDA at 1-800-FDA-1088. Where should I keep my medicine? Keep out of the reach of children. Store between 2 and 30 degrees C (36 and 86 degrees F). Throw away any unused medicine after the expiration date. NOTE: This sheet is a summary. It may not cover all possible information. If you have questions about this medicine, talk to your doctor, pharmacist, or health care provider.  2021 [employer] Standard (2018-05-17 07:14:10) Rizatriptan disintegrating tablets What is this medicine? RIZATRIPTAN (rye za TRIP tan) is used to treat migraines with  or without aura. An aura is a strange feeling or visual disturbance that warns you of an attack. It is not used to prevent migraines. This medicine may be used for other purposes; ask your health care provider or pharmacist if you have questions. COMMON BRAND NAME(S): Maxalt-MLT What should I tell my health care provider before I take this medicine? They need to know if you have any of these conditions:  cigarette smoker  circulation problems in fingers and toes  diabetes  heart disease  high blood pressure  high cholesterol  history of irregular heartbeat  history of stroke  kidney disease  liver disease  stomach or intestine problems  an unusual or allergic reaction to rizatriptan, other medicines, foods, dyes, or preservatives  pregnant or trying to get pregnant  breast-feeding How should I use this  medicine? Take this medicine by mouth. Follow the directions on the prescription label. Leave the tablet in the sealed blister pack until you are ready to take it. With dry hands, open the blister and gently remove the tablet. If the tablet breaks or crumbles, throw it away and take a new tablet out of the blister pack. Place the tablet in the mouth and allow it to dissolve, and then swallow. Do not cut, crush, or chew this medicine. You do not need water to take this medicine. Do not take it more often than directed. Talk to your pediatrician regarding the use of this medicine in children. While this drug may be prescribed for children as young as 6 years for selected conditions, precautions do apply. Overdosage: If you think you have taken too much of this medicine contact a poison control center or emergency room at once. NOTE: This medicine is only for you. Do not share this medicine with others. What if I miss a dose? This does not apply. This medicine is not for regular use. What may interact with this medicine? Do not take this medicine with any of the following  medicines:  certain medicines for migraine headache like almotriptan, eletriptan, frovatriptan, naratriptan, rizatriptan, sumatriptan, zolmitriptan  ergot alkaloids like dihydroergotamine, ergonovine, ergotamine, methylergonovine  MAOIs like Carbex, Eldepryl, Marplan, Nardil, and Parnate This medicine may also interact with the following medications:  certain medicines for depression, anxiety, or psychotic disorders  propranolol This list may not describe all possible interactions. Give your health care provider a list of all the medicines, herbs, non-prescription drugs, or dietary supplements you use. Also tell them if you smoke, drink alcohol, or use illegal drugs. Some items may interact with your medicine. What should I watch for while using this medicine? Visit your healthcare professional for regular checks on your progress. Tell your healthcare professional if your symptoms do not start to get better or if they get worse. You may get drowsy or dizzy. Do not drive, use machinery, or do anything that needs mental alertness until you know how this medicine affects you. Do not stand up or sit up quickly, especially if you are an older patient. This reduces the risk of dizzy or fainting spells. Alcohol may interfere with the effect of this medicine. Your mouth may get dry. Chewing sugarless gum or sucking hard candy and drinking plenty of water may help. Contact your healthcare professional if the problem does not go away or is severe. If you take migraine medicines for 10 or more days a month, your migraines may get worse. Keep a diary of headache days and medicine use. Contact your healthcare professional if your migraine attacks occur more frequently. What side effects may I notice from receiving this medicine? Side effects that you should report to your doctor or health care professional as soon as possible:  allergic reactions like skin rash, itching or hives, swelling of the face, lips,  or tongue  chest pain or chest tightness  signs and symptoms of a dangerous change in heartbeat or heart rhythm like chest pain; dizziness; fast, irregular heartbeat; palpitations; feeling faint or lightheaded; falls; breathing problems  signs and symptoms of a stroke like changes in vision; confusion; trouble speaking or understanding; severe headaches; sudden numbness or weakness of the face, arm or leg; trouble walking; dizziness; loss of balance or coordination  signs and symptoms of serotonin syndrome like irritable; confusion; diarrhea; fast or irregular heartbeat; muscle twitching; stiff muscles; trouble walking; sweating; high fever;  seizures; chills; vomiting Side effects that usually do not require medical attention (report to your doctor or health care professional if they continue or are bothersome):  diarrhea  dizziness  drowsiness  dry mouth  headache  nausea, vomiting  pain, tingling, numbness in the hands or feet  stomach pain This list may not describe all possible side effects. Call your doctor for medical advice about side effects. You may report side effects to FDA at 1-800-FDA-1088. Where should I keep my medicine? Keep out of the reach of children. Store at room temperature between 15 and 30 degrees C (59 and 86 degrees F). Protect from light and moisture. Throw away any unused medicine after the expiration date. NOTE: This sheet is a summary. It may not cover all possible information. If you have questions about this medicine, talk to your doctor, pharmacist, or health care provider.  2021 [employer] Standard (2017-12-07 14:58:08)

## 2020-09-23 NOTE — Progress Notes (Signed)
[facility]    Provider:  Dr Jaynee Eagles Requesting Provider: Trey Sailors, PA Primary Care Provider:  Trey Sailors, [place]  CC:  headaches  HPI:  Vicki Harper is a 25 y.o. female here as requested by Trey Sailors, PA for migraines. PMHx migraines, depression, asthma, anxiety, back pain.  I reviewed [name] notes: She has had migraines for months, chronic headaches, dull ache, lasting for hours, associated with photophobia, nausea, not associated with phonophobia, vomiting, mental status change, neck stiffness or aura.  She has never smoked.  No alcohol.  Migraines since she was young. Started worsening after having her daughter in November. Mother had migraines. Her mother had a shunt and bleeding, unclear why not really sure, mother had terrible migraines, she has pressure all over, can last 48 hours can be unilatral or bilateral, pulsating/pounding/throbbing, changes in vision and blurry vision, worsening, photophobia, nausea, no vomiting, worsening now at least 8 days a month of migraines, can be severe, no other headaches, can wake up with them and can be worse supine in the mornings, not exertional, but random, unknown triggers, not breastfeeding.No other focal neurologic deficits, associated symptoms, inciting events or modifiable factors.  Reviewed notes, labs and imaging from outside physicians, which showed:  Blood work collected July 16, 2020 include ferritin of 25 which is low, unremarkable CMP with BUN 13 and creatinine 0.94, CBC normal, hemoglobin A1c 5.2 normal, thyroid 0.42 slightly on the decreased side but T4 Free normal 05/2020 1.20 they are monitoring.    From a thorough review of records, medications tried that can be used in migraine management include: Tylenol, Zoloft, Topamax, Fioricet, Flexeril, Decadron injections, Benadryl, ibuprofen, ketorolac injections, Reglan, naproxen, nortriptyline, Zofran, Phenergan, scopolamine  patch, propranolol contraindicated due to asthma, blood pressure medications in general contraindicated due to hypertension.  Review of Systems: Patient complains of symptoms per HPI as well as the following symptoms: headaches. Pertinent negatives and positives per HPI. All others negative.   Social History   Socioeconomic History  . Marital status: Single    Spouse name: Not on file  . Number of children: 1  . Years of education: Not on file  . Highest education level: Not on file  Occupational History  . Not on file  Tobacco Use  . Smoking status: Never Smoker  . Smokeless tobacco: Never Used  Vaping Use  . Vaping Use: Never used  Substance and Sexual Activity  . Alcohol use: No    Alcohol/week: 0.0 standard drinks  . Drug use: No  . Sexual activity: Yes    Birth control/protection: Injection  Other Topics Concern  . Not on file  Social History Narrative   Lives alone    Right handed   Caffeine: 1 cup/day   Social Determinants of Health   Financial Resource Strain: Not on file  Food Insecurity: Not on file  Transportation Needs: Not on file  Physical Activity: Not on file  Stress: Not on file  Social Connections: Not on file  Intimate Partner Violence: Not on file    Family History  Problem Relation Age of Onset  . Migraines Mother   . Asthma Brother     Past Medical History:  Diagnosis Date  . Anxiety   . Asthma   . Back pain   . Depression   . Headache   . Migraines     Patient Active Problem List   Diagnosis Date Noted  . Migraine without aura and without status migrainosus,  not intractable 09/23/2020  . Major depressive disorder, recurrent episode, moderate ([place]) 07/15/2020  . LGSIL on Pap smear of cervix 09/14/2019  . Chronic pain of left knee 11/23/2014    Past Surgical History:  Procedure Laterality Date  . NO PAST SURGERIES      Current Outpatient Medications  Medication Sig Dispense Refill  . Butalbital-APAP-Caffeine 50-300-40 MG  CAPS Take 2 capsules by mouth every 8 (eight) hours as needed.    . ferrous sulfate (FERROUSUL) 325 (65 FE) MG tablet Take 1 tablet (325 mg total) by mouth daily with breakfast. 90 tablet 1  . fluticasone-salmeterol (ADVAIR HFA) 230-21 MCG/ACT inhaler Inhale 2 puffs into the lungs 2 (two) times daily.    . medroxyPROGESTERone Acetate 150 MG/ML SUSY INJECT 1 ML (150 MG TOTAL) INTO THE MUSCLE EVERY 3 (THREE) MONTHS. 1 mL 3  . ondansetron (ZOFRAN-ODT) 4 MG disintegrating tablet Take 1-2 tablets (4-8 mg total) by mouth every 8 (eight) hours as needed. Take for nausea. Also take with Rizatriptan for migraine. 30 tablet 3  . rizatriptan (MAXALT-MLT) 10 MG disintegrating tablet Take 1 tablet (10 mg total) by mouth as needed for migraine. May repeat in 2 hours if needed 9 tablet 11  . sertraline (ZOLOFT) 25 MG tablet Take 1 tablet (25 mg total) by mouth daily. 30 tablet 2  . topiramate (TOPAMAX) 100 MG tablet Take 1 tablet (100 mg total) by mouth at bedtime. 90 tablet 3   No current facility-administered medications for this visit.    Allergies as of 09/23/2020  . (Not on File)    Vitals: BP 102/74 (BP Location: Left Arm, Patient Position: Sitting)   Pulse 60   Ht 5\' 6"  (1.676 m)   Wt 147 lb 8 oz (66.9 kg)   SpO2 98%   Breastfeeding No   BMI 23.81 kg/m  Last Weight:  Wt Readings from Last 1 Encounters:  09/23/20 147 lb 8 oz (66.9 kg)   Last Height:   Ht Readings from Last 1 Encounters:  09/23/20 5\' 6"  (1.676 m)     Physical exam: Exam: Gen: NAD, conversant, well nourised, well groomed                     CV: RRR, no MRG. No Carotid Bruits. No peripheral edema, warm, nontender Eyes: Conjunctivae clear without exudates or hemorrhage  Neuro: Detailed Neurologic Exam  Speech:    Speech is normal; fluent and spontaneous with normal comprehension.  Cognition:    The patient is oriented to person, place, and time;     recent and remote memory intact;     language fluent;     normal  attention, concentration,     fund of knowledge Cranial Nerves:    The pupils are equal, round, and reactive to light. Small cups on fundoscopic exam. Visual fields are full to finger confrontation. Extraocular movements are intact. Trigeminal sensation is intact and the muscles of mastication are normal. The face is symmetric. The palate elevates in the midline. Hearing intact. Voice is normal. Shoulder shrug is normal. The tongue has normal motion without fasciculations.   Coordination:    Normal finger to nose  Gait:    Normal nativegait  Motor Observation:    No asymmetry, no atrophy, and no involuntary movements noted. Tone:    Normal muscle tone.    Posture:    Posture is normal. normal erect    Strength:    Strength is V/V in the upper and lower  limbs.      Sensation: intact to LT     Reflex Exam:  DTR's:    Deep tendon reflexes in the upper and lower extremities are normal bilaterally.   Toes:    The toes are downgoing bilaterally.   Clonus:    Clonus is absent.    Assessment/Plan:  Patient with migraines however given worsening symptoms needs thorough evaluation  MRI brain due to concerning symptoms of morning headaches, positional headaches,vision changes  to look for space occupying mass, chiari or intracranial hypertension (pseudotumor). She is also postpartum and has very small cups on fundoscopic exam, will send to [facility] ophthalmology and have reached out to see if this is urgent matter. So appreciate our colleague, he will see her within a week or two (much appreciated!)  Acute/emergency: Rizatriptan and Ondansetron together.   Preventative: Increase topiramate to 100mg  just at bedtime. Discussed teratogenicity do not get pregnant.  If this fails, consider Emgality or Qulipta as preventative(new medications on the market)  From a thorough review of records, medications tried that can be used in migraine management include: Tylenol, Zoloft, Topamax,  Fioricet, Flexeril, Decadron injections, Benadryl, ibuprofen, ketorolac injections, Reglan, naproxen, nortriptyline, Zofran, Phenergan, scopolamine patch, propranolol contraindicated due to asthma, blood pressure medications in general contraindicated due to hypertension.  Orders Placed This Encounter  Procedures  . MR BRAIN W WO CONTRAST  . Ambulatory referral to Ophthalmology   Meds ordered this encounter  Medications  . rizatriptan (MAXALT-MLT) 10 MG disintegrating tablet    Sig: Take 1 tablet (10 mg total) by mouth as needed for migraine. May repeat in 2 hours if needed    Dispense:  9 tablet    Refill:  11  . ondansetron (ZOFRAN-ODT) 4 MG disintegrating tablet    Sig: Take 1-2 tablets (4-8 mg total) by mouth every 8 (eight) hours as needed. Take for nausea. Also take with Rizatriptan for migraine.    Dispense:  30 tablet    Refill:  3  . topiramate (TOPAMAX) 100 MG tablet    Sig: Take 1 tablet (100 mg total) by mouth at bedtime.    Dispense:  90 tablet    Refill:  3   Discussed: To prevent or relieve headaches, try the following: Cool Compress. Lie down and place a cool compress on your head.  Avoid headache triggers. If certain foods or odors seem to have triggered your migraines in the past, avoid them. A headache diary might help you identify triggers.  Include physical activity in your daily routine. Try a daily walk or other moderate aerobic exercise.  Manage stress. Find healthy ways to cope with the stressors, such as delegating tasks on your to-do list.  Practice relaxation techniques. Try deep breathing, yoga, massage and visualization.  Eat regularly. Eating regularly scheduled meals and maintaining a healthy diet might help prevent headaches. Also, drink plenty of fluids.  Follow a regular sleep schedule. Sleep deprivation might contribute to headaches Consider biofeedback. With this mind-body technique, you learn to control certain bodily functions -- such as muscle  tension, heart rate and blood pressure -- to prevent headaches or reduce headache pain.    Proceed to emergency room if you experience new or worsening symptoms or symptoms do not resolve, if you have new neurologic symptoms or if headache is severe, or for any concerning symptom.   Provided education and documentation from American headache Society toolbox including articles on: chronic migraine medication overuse headache, chronic migraines, prevention of migraines, behavioral  and other nonpharmacologic treatments for headache.  Cc: Trey Sailors, PA,  Trey Sailors, [place]  Sarina Ill, MD  [facility] [address] [place], [place] [address]  Phone [phone] Fax [phone]

## 2020-09-23 NOTE — Telephone Encounter (Signed)
[facility] [employer] community auth: [id number] (exp. 09/23/20 to 11/07/20) order sent to GI. They will reach out to the patient to schedule.

## 2020-09-28 ENCOUNTER — Ambulatory Visit
Admission: RE | Admit: 2020-09-28 | Discharge: 2020-09-28 | Disposition: A | Discharge status: Home / Self Care | Payer: Medicaid Other | Source: Ambulatory Visit | Attending: Neurology | Admitting: Neurology

## 2020-09-28 DIAGNOSIS — R519 Headache, unspecified: Secondary | ICD-10-CM | POA: Diagnosis not present

## 2020-09-28 DIAGNOSIS — R51 Headache with orthostatic component, not elsewhere classified: Secondary | ICD-10-CM

## 2020-09-28 DIAGNOSIS — H538 Other visual disturbances: Secondary | ICD-10-CM

## 2020-09-28 DIAGNOSIS — H579 Unspecified disorder of eye and adnexa: Secondary | ICD-10-CM

## 2020-09-28 MED ORDER — GADOBENATE DIMEGLUMINE 529 MG/ML IV SOLN
13.0000 mL | Freq: Once | INTRAVENOUS | Status: AC | PRN
  Administered 2020-09-28: 13 mL via INTRAVENOUS

## 2020-10-01 ENCOUNTER — Other Ambulatory Visit: Payer: Medicaid Other

## 2020-10-02 ENCOUNTER — Other Ambulatory Visit: Payer: Medicaid Other

## 2020-10-06 ENCOUNTER — Other Ambulatory Visit: Payer: Self-pay | Admitting: Family Medicine

## 2020-11-05 ENCOUNTER — Ambulatory Visit: Payer: Medicaid Other

## 2020-11-26 ENCOUNTER — Other Ambulatory Visit: Payer: Self-pay

## 2020-11-26 ENCOUNTER — Telehealth (HOSPITAL_COMMUNITY): Payer: Medicaid Other | Admitting: Psychiatry
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
## 2020-12-03 NOTE — Patient Instructions (Signed)
Below is our plan:  We will continue topiramate 100mg  daily. Start amitriptyline 10mg  at bedtime. We will start sumatriptan 100mg  as needed for migraine abortion. You may try taking 1/2 tablet for the first dose to see if that works. Take the first dose when you can be home resting. Stop rizatriptan.   Please make sure you are staying well hydrated. I recommend 50-60 ounces daily. Well balanced diet and regular exercise encouraged. Consistent sleep schedule with 6-8 hours recommended.   Please continue follow up with care team as directed.   Follow up with me in 3 months   You may receive a survey regarding today's visit. I encourage you to leave honest feed back as I do use this information to improve patient care. Thank you for seeing me today!
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
## 2020-12-04 ENCOUNTER — Encounter: Payer: Self-pay | Admitting: Family Medicine

## 2020-12-04 ENCOUNTER — Ambulatory Visit: Payer: Self-pay | Admitting: Family Medicine

## 2020-12-05 ENCOUNTER — Encounter: Payer: Self-pay | Admitting: Family Medicine

## 2020-12-05 ENCOUNTER — Other Ambulatory Visit: Payer: Self-pay

## 2020-12-05 ENCOUNTER — Ambulatory Visit: Payer: Medicaid Other | Admitting: Family Medicine

## 2020-12-05 VITALS — BP 104/65 | HR 69 | Ht 66.0 in | Wt 157.8 lb

## 2020-12-05 DIAGNOSIS — G43009 Migraine without aura, not intractable, without status migrainosus: Secondary | ICD-10-CM

## 2020-12-05 MED ORDER — SUMATRIPTAN SUCCINATE 100 MG PO TABS: 100.0000 mg | ORAL_TABLET | Freq: Once | ORAL | 2 refills | Status: DC | PRN

## 2020-12-05 MED ORDER — AMITRIPTYLINE HCL 10 MG PO TABS: 10.0000 mg | ORAL_TABLET | Freq: Every day | ORAL | 1 refills | Status: DC

## 2020-12-23 ENCOUNTER — Other Ambulatory Visit: Payer: Self-pay | Admitting: Family Medicine

## 2020-12-23 ENCOUNTER — Ambulatory Visit: Payer: Medicaid Other | Admitting: Family Medicine

## 2021-03-24 ENCOUNTER — Encounter: Payer: Self-pay | Admitting: *Deleted

## 2021-03-24 ENCOUNTER — Ambulatory Visit: Payer: 59 | Admitting: Family Medicine

## 2021-03-24 ENCOUNTER — Encounter: Payer: Self-pay | Admitting: Family Medicine

## 2021-03-24 VITALS — Ht 66.0 in | Wt 160.0 lb

## 2021-03-24 DIAGNOSIS — G43009 Migraine without aura, not intractable, without status migrainosus: Secondary | ICD-10-CM | POA: Diagnosis not present

## 2021-03-24 MED ORDER — NURTEC 75 MG PO TBDP: 75.0000 mg | ORAL_TABLET | Freq: Every day | ORAL | 11 refills | Status: DC | PRN

## 2021-03-24 NOTE — Patient Instructions (Signed)
Below is our plan:  We will continue topiramate 100mg  and amitriptyline 10mg  at bedtime. I have called in Nurtec for abortive therapy. Take 75mg  daily as needed. You do have to repeat dose but you can take it daily for 2-3 days if needed. If insurance will not cover, we will try eletriptan.   Please make sure you are staying well hydrated. I recommend 50-60 ounces daily. Well balanced diet and regular exercise encouraged. Consistent sleep schedule with 6-8 hours recommended.   Please continue follow up with care team as directed.   Follow up with me in 6 months.   You may receive a survey regarding today's visit. I encourage you to leave honest feed back as I do use this information to improve patient care. Thank you for seeing me today!
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
## 2021-03-27 NOTE — Telephone Encounter (Signed)
Took call from phone staff and spoke with pt. She states she spoke w/ insurance who instructed [place] to call PA department at [phone] to complete PA.   I called and spoke w/ rep. Submitted PA over the phone. [id number]. Should have determination in 3-4 business days.

## 2021-04-03 NOTE — Telephone Encounter (Signed)
Called to check on status of PA. PA approved effective 03/27/21. No specific approval info given.

## 2021-04-21 ENCOUNTER — Emergency Department (HOSPITAL_BASED_OUTPATIENT_CLINIC_OR_DEPARTMENT_OTHER)
Admission: EM | Admit: 2021-04-21 | Discharge: 2021-04-21 | Disposition: A | Discharge status: Home / Self Care | Payer: 59 | Attending: Emergency Medicine | Admitting: Emergency Medicine

## 2021-04-21 ENCOUNTER — Encounter (HOSPITAL_BASED_OUTPATIENT_CLINIC_OR_DEPARTMENT_OTHER): Payer: Self-pay | Admitting: Emergency Medicine

## 2021-04-21 ENCOUNTER — Other Ambulatory Visit: Payer: Self-pay

## 2021-04-21 DIAGNOSIS — Z7951 Long term (current) use of inhaled steroids: Secondary | ICD-10-CM | POA: Diagnosis not present

## 2021-04-21 DIAGNOSIS — J029 Acute pharyngitis, unspecified: Secondary | ICD-10-CM | POA: Insufficient documentation

## 2021-04-21 DIAGNOSIS — J039 Acute tonsillitis, unspecified: Secondary | ICD-10-CM

## 2021-04-21 DIAGNOSIS — J45909 Unspecified asthma, uncomplicated: Secondary | ICD-10-CM | POA: Diagnosis not present

## 2021-04-21 DIAGNOSIS — Z20822 Contact with and (suspected) exposure to covid-19: Secondary | ICD-10-CM | POA: Insufficient documentation

## 2021-04-21 LAB — RESP PANEL BY RT-PCR (FLU A&B, COVID) ARPGX2
Influenza A by PCR: NEGATIVE
Influenza B by PCR: NEGATIVE
SARS Coronavirus 2 by RT PCR: NEGATIVE

## 2021-04-21 MED ORDER — PREDNISONE 10 MG PO TABS: 20.0000 mg | ORAL_TABLET | Freq: Two times a day (BID) | ORAL | 0 refills | Status: DC

## 2021-04-21 MED ORDER — PREDNISONE 20 MG PO TABS
20.0000 mg | ORAL_TABLET | Freq: Once | ORAL | Status: AC
  Administered 2021-04-21: 20 mg via ORAL
  Filled 2021-04-21: qty 1

## 2021-04-21 MED ORDER — CEPHALEXIN 250 MG PO CAPS
500.0000 mg | ORAL_CAPSULE | Freq: Once | ORAL | Status: AC
  Administered 2021-04-21: 500 mg via ORAL
  Filled 2021-04-21: qty 2

## 2021-04-21 MED ORDER — CEPHALEXIN 500 MG PO CAPS: 500.0000 mg | ORAL_CAPSULE | Freq: Three times a day (TID) | ORAL | 0 refills | Status: DC

## 2021-04-21 NOTE — ED Triage Notes (Addendum)
Pt states she woke up with a sore throat yesterday (Sunday) am. Also reports "body feels weak". Tmax at home 100.6. 3 episodes of emesis. reports taking alka seltzer at home for her sx.

## 2021-04-21 NOTE — Discharge Instructions (Signed)
Begin taking Keflex and prednisone as prescribed.  Drink plenty of fluids and get plenty of rest.  Follow-up with primary doctor if not improving in the next few days, and return to the ER if symptoms significantly worsen or change.

## 2021-04-21 NOTE — ED Provider Notes (Signed)
[facility] EMERGENCY DEPARTMENT Provider Note   CSN: [id number] Arrival date & time: 04/21/21  0117     History Chief Complaint  Patient presents with   Sore Throat    Vicki Harper is a 25 y.o. female.  Patient is a 25 year old female with past medical history of asthma and anxiety.  She presents today with complaints of sore throat.  This has been present for the past 2 days.  She has been running low-grade fevers at home.  She denies any cough.  She denies any COVID contacts.  The history is provided by the patient.  Sore Throat This is a new problem. The current episode started 2 days ago. The problem occurs constantly. The problem has been rapidly worsening. The symptoms are aggravated by swallowing. Nothing relieves the symptoms. She has tried nothing for the symptoms.      Past Medical History:  Diagnosis Date   Anxiety    Asthma    Back pain    Depression    Headache    Migraines     Patient Active Problem List   Diagnosis Date Noted   Migraine without aura and without status migrainosus, not intractable 09/23/2020   Major depressive disorder, recurrent episode, moderate ([place]) 07/15/2020   LGSIL on Pap smear of cervix 09/14/2019   Chronic pain of left knee 11/23/2014    Past Surgical History:  Procedure Laterality Date   NO PAST SURGERIES       OB History     Gravida  1   Para  1   Term  1   Preterm      AB      Living  1      SAB      IAB      Ectopic      Multiple  0   Live Births  1           Family History  Problem Relation Age of Onset   Migraines Mother    Asthma Brother     Social History   Tobacco Use   Smoking status: Never   Smokeless tobacco: Never  Vaping Use   Vaping Use: Never used  Substance Use Topics   Alcohol use: No    Alcohol/week: 0.0 standard drinks   Drug use: No    Home Medications Prior to Admission medications   Medication Sig Start Date End Date Taking? Authorizing  Provider  amitriptyline (ELAVIL) 10 MG tablet Take 1 tablet (10 mg total) by mouth at bedtime. 12/05/20   Lomax, Amy, NP  Butalbital-APAP-Caffeine 50-300-40 MG CAPS Take 2 capsules by mouth every 8 (eight) hours as needed. 08/08/20   [provider]  ferrous sulfate (FERROUSUL) 325 (65 FE) MG tablet Take 1 tablet (325 mg total) by mouth daily with breakfast. 04/15/20   Truett Mainland, DO  fluticasone-salmeterol (ADVAIR HFA) 230-21 MCG/ACT inhaler Inhale 2 puffs into the lungs 2 (two) times daily.    [provider]  gabapentin (NEURONTIN) 100 MG capsule Take 100 mg by mouth at bedtime.    [provider]  medroxyPROGESTERone Acetate 150 MG/ML SUSY INJECT 1 ML (150 MG TOTAL) INTO THE MUSCLE EVERY 3 (THREE) MONTHS. 08/19/20 08/19/21  Lavonia Drafts, MD  meloxicam (MOBIC) 7.5 MG tablet Take 7.5 mg by mouth daily as needed for pain.    [provider]  metroNIDAZOLE (FLAGYL) 500 MG tablet Take 1 tablet by mouth twice daily 12/23/20   Truett Mainland,  DO  ondansetron (ZOFRAN-ODT) 4 MG disintegrating tablet Take 1-2 tablets (4-8 mg total) by mouth every 8 (eight) hours as needed. Take for nausea. Also take with Rizatriptan for migraine. 09/23/20   Melvenia Beam, MD  Rimegepant Sulfate (NURTEC) 75 MG TBDP Take 75 mg by mouth daily as needed. 03/24/21   Lomax, Amy, NP  sertraline (ZOLOFT) 25 MG tablet Take 1 tablet (25 mg total) by mouth daily. 08/30/20   Salley Slaughter, NP  tiZANidine (ZANAFLEX) 2 MG tablet Take 2-4 mg by mouth at bedtime.    [provider]  topiramate (TOPAMAX) 100 MG tablet Take 1 tablet (100 mg total) by mouth at bedtime. 09/23/20   Melvenia Beam, MD    Allergies    Patient has no known allergies.  Review of Systems   Review of Systems  All other systems reviewed and are negative.  Physical Exam Updated Vital Signs BP 109/83 (BP Location: Left Arm)   Pulse (!) 104   Temp 99.3 F (37.4 C) (Oral)   Resp 20   Ht 5\' 6"   (1.676 m)   Wt 70.3 kg   LMP 04/11/2021   SpO2 100%   BMI 25.02 kg/m   Physical Exam Vitals and nursing note reviewed.  Constitutional:      General: She is not in acute distress.    Appearance: She is well-developed. She is not diaphoretic.  HENT:     Head: Normocephalic and atraumatic.     Right Ear: Tympanic membrane normal.     Left Ear: Tympanic membrane normal.     Mouth/Throat:     Mouth: Mucous membranes are moist.     Pharynx: Posterior oropharyngeal erythema present. No oropharyngeal exudate or uvula swelling.     Comments: There is swelling of both tonsils. Cardiovascular:     Rate and Rhythm: Normal rate and regular rhythm.     Heart sounds: No murmur heard.   No friction rub. No gallop.  Pulmonary:     Effort: Pulmonary effort is normal. No respiratory distress.     Breath sounds: Normal breath sounds. No wheezing.  Abdominal:     General: Bowel sounds are normal. There is no distension.     Palpations: Abdomen is soft.     Tenderness: There is no abdominal tenderness.  Musculoskeletal:        General: Normal range of motion.     Cervical back: Normal range of motion and neck supple.  Skin:    General: Skin is warm and dry.  Neurological:     General: No focal deficit present.     Mental Status: She is alert and oriented to person, place, and time.    ED Results / Procedures / Treatments   Labs (all labs ordered are listed, but only abnormal results are displayed) Labs Reviewed  RESP PANEL BY RT-PCR (FLU A&B, COVID) [id number]    EKG None  Radiology No results found.  Procedures Procedures   Medications Ordered in ED Medications  predniSONE (DELTASONE) tablet 20 mg (has no administration in time range)  cephALEXin (KEFLEX) capsule 500 mg (has no administration in time range)    ED Course  I have reviewed the triage vital signs and the nursing notes.  Pertinent labs & imaging results that were available during my care of the patient were  reviewed by me and considered in my medical decision making (see chart for details).    MDM Rules/Calculators/A&P  COVID and influenza negative.  Patient will  be treated for presumed strep/tonsillitis due to history of same.  Final Clinical Impression(s) / ED Diagnoses Final diagnoses:  None    Rx / DC Orders ED Discharge Orders     None        Veryl Speak, MD 04/21/21 [id number]

## 2021-05-02 ENCOUNTER — Emergency Department (HOSPITAL_BASED_OUTPATIENT_CLINIC_OR_DEPARTMENT_OTHER)
Admission: EM | Admit: 2021-05-02 | Discharge: 2021-05-02 | Disposition: A | Discharge status: Home / Self Care | Payer: 59 | Attending: Emergency Medicine | Admitting: Emergency Medicine

## 2021-05-02 ENCOUNTER — Other Ambulatory Visit: Payer: Self-pay

## 2021-05-02 ENCOUNTER — Encounter (HOSPITAL_BASED_OUTPATIENT_CLINIC_OR_DEPARTMENT_OTHER): Payer: Self-pay

## 2021-05-02 DIAGNOSIS — Z28310 Unvaccinated for covid-19: Secondary | ICD-10-CM | POA: Diagnosis not present

## 2021-05-02 DIAGNOSIS — J45909 Unspecified asthma, uncomplicated: Secondary | ICD-10-CM | POA: Diagnosis not present

## 2021-05-02 DIAGNOSIS — Z7951 Long term (current) use of inhaled steroids: Secondary | ICD-10-CM | POA: Insufficient documentation

## 2021-05-02 DIAGNOSIS — J069 Acute upper respiratory infection, unspecified: Secondary | ICD-10-CM | POA: Insufficient documentation

## 2021-05-02 DIAGNOSIS — R059 Cough, unspecified: Secondary | ICD-10-CM | POA: Diagnosis present

## 2021-05-02 LAB — BASIC METABOLIC PANEL
Anion gap: 9 (ref 5–15)
BUN: 10 mg/dL (ref 6–20)
CO2: 22 mmol/L (ref 22–32)
Calcium: 8.8 mg/dL — ABNORMAL LOW (ref 8.9–10.3)
Chloride: 102 mmol/L (ref 98–111)
Creatinine, Ser: 0.96 mg/dL (ref 0.44–1.00)
GFR, Estimated: >60 mL/min (ref >60)
Glucose, Bld: 83 mg/dL (ref 70–99)
Potassium: 4 mmol/L (ref 3.5–5.1)
Sodium: 133 mmol/L — ABNORMAL LOW (ref 135–145)

## 2021-05-02 LAB — CBC WITH DIFFERENTIAL/PLATELET
Abs Immature Granulocytes: 0.02 10*3/uL (ref 0.00–0.07)
Basophils Absolute: 0 10*3/uL (ref 0.0–0.1)
Basophils Relative: 0 %
Eosinophils Absolute: 0 10*3/uL (ref 0.0–0.5)
Eosinophils Relative: 0 %
HCT: 37.9 % (ref 36.0–46.0)
Hemoglobin: 12.4 g/dL (ref 12.0–15.0)
Immature Granulocytes: 0 %
Lymphocytes Relative: 9 %
Lymphs Abs: 0.8 10*3/uL (ref 0.7–4.0)
MCH: 30.5 pg (ref 26.0–34.0)
MCHC: 32.7 g/dL (ref 30.0–36.0)
MCV: 93.1 fL (ref 80.0–100.0)
Monocytes Absolute: 0.7 10*3/uL (ref 0.1–1.0)
Monocytes Relative: 7 %
Neutro Abs: 7.7 10*3/uL (ref 1.7–7.7)
Neutrophils Relative %: 84 %
Platelets: 230 10*3/uL (ref 150–400)
RBC: 4.07 MIL/uL (ref 3.87–5.11)
RDW: 12.6 % (ref 11.5–15.5)
WBC: 9.3 10*3/uL (ref 4.0–10.5)
nRBC: 0 % (ref 0.0–0.2)

## 2021-05-02 LAB — GROUP A STREP BY PCR: Group A Strep by PCR: NOT DETECTED

## 2021-05-02 MED ORDER — IPRATROPIUM-ALBUTEROL 0.5-2.5 (3) MG/3ML IN SOLN
3.0000 mL | Freq: Once | RESPIRATORY_TRACT | Status: AC
  Administered 2021-05-02: 3 mL via RESPIRATORY_TRACT
  Filled 2021-05-02: qty 3

## 2021-05-02 NOTE — Discharge Instructions (Signed)
Likely a viral infection, recommend over-the-counter pain medications like ibuprofen Tylenol for fever and pain control, nasal decongestions like Flonase and Zyrtec, Mucinex for cough.  If not eating recommend supplementing with Gatorade to help with electrolyte supplementation.  Follow-up PCP for further evaluation.  Come back to the emergency department if you develop chest pain, shortness of breath, severe abdominal pain, uncontrolled nausea, vomiting, diarrhea.  

## 2021-05-02 NOTE — ED Notes (Signed)
RN Rosana Hoes attempted one time blood draw from the [facility] with out success.  RN called for second RN to come and try for a second attempt due to Pt. And Pt. Mother very timid on the IV and Blood draw attempt.

## 2021-05-02 NOTE — ED Triage Notes (Signed)
Pt c/o cough, hoarse voice cramping in arms, legs & back. Hx of anemia and hypokalemia. States chest tightness, worse with coughing. States took inhaler without relief. Hx of asthma.

## 2021-05-02 NOTE — ED Provider Notes (Signed)
[facility] EMERGENCY DEPARTMENT Provider Note   CSN: [id number] Arrival date & time: 05/02/21  1615     History Chief Complaint  Patient presents with   Cough    Stacie Templin is a 25 y.o. female.  HPI  Patient with significant medical history including anxiety, asthma, presents with complaints of URI-like symptoms.  Patient states symptoms started Wednesday night, she endorses nasal congestion, slight sore throat, nonproductive cough, chest tightness, and general body aches.  Patient denies  fevers, chills, chest pain, shortness of breath, stomach pains, nausea, vomit, diarrhea.  She denies  recent sick contacts, has not been vaccine against COVID or influenza, not immunocompromise.  She endorses that she has a history of anemia as well as hypokalemia, states that she has been having heavy menstrual cycles, has not had recent lab work.  She states that she has taken albuterol without much relief.  She denies alleviating or aggravating factors.  Past Medical History:  Diagnosis Date   Anxiety    Asthma    Back pain    Depression    Headache    Migraines     Patient Active Problem List   Diagnosis Date Noted   Migraine without aura and without status migrainosus, not intractable 09/23/2020   Major depressive disorder, recurrent episode, moderate ([place]) 07/15/2020   LGSIL on Pap smear of cervix 09/14/2019   Chronic pain of left knee 11/23/2014    Past Surgical History:  Procedure Laterality Date   NO PAST SURGERIES       OB History     Gravida  1   Para  1   Term  1   Preterm      AB      Living  1      SAB      IAB      Ectopic      Multiple  0   Live Births  1           Family History  Problem Relation Age of Onset   Migraines Mother    Asthma Brother     Social History   Tobacco Use   Smoking status: Never   Smokeless tobacco: Never  Vaping Use   Vaping Use: Never used  Substance Use Topics   Alcohol use: No     Alcohol/week: 0.0 standard drinks   Drug use: No    Home Medications Prior to Admission medications   Medication Sig Start Date End Date Taking? Authorizing Provider  amitriptyline (ELAVIL) 10 MG tablet Take 1 tablet (10 mg total) by mouth at bedtime. 12/05/20   Lomax, Amy, NP  Butalbital-APAP-Caffeine 50-300-40 MG CAPS Take 2 capsules by mouth every 8 (eight) hours as needed. 08/08/20   [provider]  cephALEXin (KEFLEX) 500 MG capsule Take 1 capsule (500 mg total) by mouth 3 (three) times daily. 04/21/21   Veryl Speak, MD  ferrous sulfate (FERROUSUL) 325 (65 FE) MG tablet Take 1 tablet (325 mg total) by mouth daily with breakfast. 04/15/20   Truett Mainland, DO  fluticasone-salmeterol (ADVAIR HFA) 230-21 MCG/ACT inhaler Inhale 2 puffs into the lungs 2 (two) times daily.    [provider]  gabapentin (NEURONTIN) 100 MG capsule Take 100 mg by mouth at bedtime.    [provider]  medroxyPROGESTERone Acetate 150 MG/ML SUSY INJECT 1 ML (150 MG TOTAL) INTO THE MUSCLE EVERY 3 (THREE) MONTHS. 08/19/20 08/19/21  Lavonia Drafts, MD  meloxicam (MOBIC) 7.5 MG tablet  Take 7.5 mg by mouth daily as needed for pain.    [provider]  metroNIDAZOLE (FLAGYL) 500 MG tablet Take 1 tablet by mouth twice daily 12/23/20   Truett Mainland, DO  ondansetron (ZOFRAN-ODT) 4 MG disintegrating tablet Take 1-2 tablets (4-8 mg total) by mouth every 8 (eight) hours as needed. Take for nausea. Also take with Rizatriptan for migraine. 09/23/20   Melvenia Beam, MD  predniSONE (DELTASONE) 10 MG tablet Take 2 tablets (20 mg total) by mouth 2 (two) times daily with a meal. 04/21/21   Veryl Speak, MD  Rimegepant Sulfate (NURTEC) 75 MG TBDP Take 75 mg by mouth daily as needed. 03/24/21   Lomax, Amy, NP  sertraline (ZOLOFT) 25 MG tablet Take 1 tablet (25 mg total) by mouth daily. 08/30/20   Salley Slaughter, NP  tiZANidine (ZANAFLEX) 2 MG tablet Take 2-4 mg by mouth at bedtime.     [provider]  topiramate (TOPAMAX) 100 MG tablet Take 1 tablet (100 mg total) by mouth at bedtime. 09/23/20   Melvenia Beam, MD    Allergies    Patient has no known allergies.  Review of Systems   Review of Systems  Constitutional:  Negative for chills and fever.  HENT:  Positive for congestion. Negative for sore throat.   Respiratory:  Positive for cough and chest tightness. Negative for shortness of breath.   Cardiovascular:  Negative for chest pain.  Gastrointestinal:  Negative for abdominal pain, diarrhea, nausea and vomiting.  Genitourinary:  Positive for menstrual problem. Negative for decreased urine volume, enuresis, vaginal bleeding and vaginal discharge.  Musculoskeletal:  Negative for back pain.  Skin:  Negative for rash.  Neurological:  Negative for dizziness and headaches.  Hematological:  Does not bruise/bleed easily.   Physical Exam Updated Vital Signs BP 102/70   Pulse 96   Temp 98.6 F (37 C) (Oral)   Resp 18   Ht 5\' 6"  (1.676 m)   Wt 71.2 kg   LMP 04/11/2021   SpO2 100%   BMI 25.34 kg/m   Physical Exam Vitals and nursing note reviewed.  Constitutional:      General: She is not in acute distress.    Appearance: She is not ill-appearing.  HENT:     Head: Normocephalic and atraumatic.     Nose: Congestion present.     Comments: Erythematous turbinates bilaterally.    Mouth/Throat:     Mouth: Mucous membranes are moist.     Pharynx: Oropharynx is clear. No oropharyngeal exudate or posterior oropharyngeal erythema.     Comments: Cobblestoning on the posterior pharynx. Eyes:     Conjunctiva/sclera: Conjunctivae normal.  Cardiovascular:     Rate and Rhythm: Normal rate and regular rhythm.     Pulses: Normal pulses.     Heart sounds: No murmur heard.   No friction rub. No gallop.  Pulmonary:     Effort: No respiratory distress.     Breath sounds: No wheezing, rhonchi or rales.     Comments: No signs of respiratory distress, able speak  in full sentences, no tachypnea or hypoxia, lung sounds significant for slight tight sounding, no wheezing, rhonchi, stridor present. Abdominal:     Palpations: Abdomen is soft.     Tenderness: There is no abdominal tenderness. There is no right CVA tenderness or left CVA tenderness.  Musculoskeletal:     Right lower leg: No edema.     Left lower leg: No edema.  Skin:  General: Skin is warm and dry.  Neurological:     Mental Status: She is alert.  Psychiatric:        Mood and Affect: Mood normal.    ED Results / Procedures / Treatments   Labs (all labs ordered are listed, but only abnormal results are displayed) Labs Reviewed  BASIC METABOLIC PANEL - Abnormal; Notable for the following components:      Result Value   Sodium 133 (*)    Calcium 8.8 (*)    All other components within normal limits  GROUP A STREP BY PCR  CBC WITH DIFFERENTIAL/PLATELET    EKG None  Radiology No results found.  Procedures Procedures   Medications Ordered in ED Medications  ipratropium-albuterol (DUONEB) 0.5-2.5 (3) MG/3ML nebulizer solution 3 mL (3 mLs Nebulization Given 05/02/21 1728)    ED Course  I have reviewed the triage vital signs and the nursing notes.  Pertinent labs & imaging results that were available during my care of the patient were reviewed by me and considered in my medical decision making (see chart for details).    MDM Rules/Calculators/A&P                          Initial impression-presents with URI-like symptoms.  Alert, no acute distress, vital signs are reassuring.  Likely asthma exacerbation secondary to viral infection, will obtain strep test as she is endorsing sore throat, also add on basic lab work she is endorsing muscle cramping and heavy menstrual cycles has history of anemia as well as hypokalemia.  Work-up-CBC unremarkable, BMP shows sodium 133, calcium 8.8, strep test negative.  Reassessment-reassessed after albuterol treatment states she is  feeling much better, vital signs remained stable, she has no complaints, she is agreeable for discharge.  Rule out- Low suspicion for systemic infection as patient is nontoxic-appearing, vital signs reassuring, no obvious source infection noted on exam.  Low suspicion for pneumonia as lung sounds are clear bilaterally, will defer imaging at this time.   I have low suspicion for PE as patient denies pleuritic chest pain, shortness of breath, patient is PERC. low suspicion for strep throat as oropharynx was visualized, no erythema or exudates noted.  Low suspicion patient would need  hospitalized due to viral infection or Covid as vital signs reassuring, patient is not in respiratory distress.   Plan-  URI-likely viral infection, will recommend symptom management, follow-up PCP for further evaluation.  Vital signs have remained stable, no indication for hospital admission.    Patient given at home care as well strict return precautions.  Patient verbalized that they understood agreed to said plan.  Final Clinical Impression(s) / ED Diagnoses Final diagnoses:  Viral URI with cough    Rx / DC Orders ED Discharge Orders     None        Marcello Fennel, PA-C 05/02/21 Einar Crow    Malvin Johns, MD 05/02/21 2244

## 2021-07-17 ENCOUNTER — Encounter (HOSPITAL_COMMUNITY): Payer: Self-pay | Admitting: Emergency Medicine

## 2021-07-17 ENCOUNTER — Emergency Department (HOSPITAL_COMMUNITY)
Admission: EM | Admit: 2021-07-17 | Discharge: 2021-07-17 | Discharge status: Against Medical Advice | Payer: 59 | Attending: Emergency Medicine | Admitting: Emergency Medicine

## 2021-07-17 ENCOUNTER — Other Ambulatory Visit: Payer: Self-pay

## 2021-07-17 DIAGNOSIS — R42 Dizziness and giddiness: Secondary | ICD-10-CM | POA: Diagnosis not present

## 2021-07-17 DIAGNOSIS — Z5321 Procedure and treatment not carried out due to patient leaving prior to being seen by health care provider: Secondary | ICD-10-CM | POA: Insufficient documentation

## 2021-07-17 DIAGNOSIS — R112 Nausea with vomiting, unspecified: Secondary | ICD-10-CM | POA: Insufficient documentation

## 2021-07-17 LAB — CBC
HCT: 36.8 % (ref 36.0–46.0)
Hemoglobin: 11.7 g/dL — ABNORMAL LOW (ref 12.0–15.0)
MCH: 30.2 pg (ref 26.0–34.0)
MCHC: 31.8 g/dL (ref 30.0–36.0)
MCV: 94.8 fL (ref 80.0–100.0)
Platelets: 257 10*3/uL (ref 150–400)
RBC: 3.88 MIL/uL (ref 3.87–5.11)
RDW: 12.1 % (ref 11.5–15.5)
WBC: 6.8 10*3/uL (ref 4.0–10.5)
nRBC: 0 % (ref 0.0–0.2)

## 2021-07-17 LAB — BASIC METABOLIC PANEL
Anion gap: 7 (ref 5–15)
BUN: 15 mg/dL (ref 6–20)
CO2: 23 mmol/L (ref 22–32)
Calcium: 8.9 mg/dL (ref 8.9–10.3)
Chloride: 107 mmol/L (ref 98–111)
Creatinine, Ser: 0.89 mg/dL (ref 0.44–1.00)
GFR, Estimated: >60 mL/min (ref >60)
Glucose, Bld: 104 mg/dL — ABNORMAL HIGH (ref 70–99)
Potassium: 3.8 mmol/L (ref 3.5–5.1)
Sodium: 137 mmol/L (ref 135–145)

## 2021-07-17 MED ORDER — ONDANSETRON 4 MG PO TBDP
4.0000 mg | ORAL_TABLET | Freq: Once | ORAL | Status: AC
Start: 2021-07-17 — End: 2021-07-17
  Administered 2021-07-17: 4 mg via ORAL
  Filled 2021-07-17: qty 1

## 2021-07-17 NOTE — ED Provider Triage Note (Signed)
Emergency Medicine Provider Triage Evaluation Note  Rashada Klontz , a 26 y.o. female  was evaluated in triage.  Pt complains of lightheadedness/dizziness, nausea and vomiting since after lunch.  States sudden onset.  Denies prior symptoms.  Without URI symptoms.  Patient works as a Technical brewer for [employer].  States she has been drinking fluids but unsure if she has been drinking adequate fluids..  Review of Systems  Positive: As above Negative: As above  Physical Exam  BP 114/73 (BP Location: Right Arm)    Pulse 74    Temp 98.4 F (36.9 C) (Oral)    Resp 18    LMP 07/17/2021    SpO2 100%  Gen:   Awake, no distress   Resp:  Normal effort  MSK:   Moves extremities without difficulty  Other:    Medical Decision Making  Medically screening exam initiated at 4:23 PM.  Appropriate orders placed.  Alannis Hsia was informed that the remainder of the evaluation will be completed by another provider, this initial triage assessment does not replace that evaluation, and the importance of remaining in the ED until their evaluation is complete.     Evlyn Courier, PA-C 07/17/21 1624

## 2021-07-17 NOTE — ED Triage Notes (Signed)
Pt presents via EMS. Pt was at work and was returning from lunch and after lunch began to have hot flashes, sweating, dizziness, and nausea. Pt has been vomiting as well.

## 2021-07-23 ENCOUNTER — Encounter: Payer: Self-pay | Admitting: Neurology

## 2021-07-23 ENCOUNTER — Ambulatory Visit (INDEPENDENT_AMBULATORY_CARE_PROVIDER_SITE_OTHER): Payer: 59 | Admitting: Neurology

## 2021-07-23 VITALS — BP 111/71 | HR 60 | Ht 66.0 in | Wt 148.8 lb

## 2021-07-23 DIAGNOSIS — R252 Cramp and spasm: Secondary | ICD-10-CM | POA: Diagnosis not present

## 2021-07-23 DIAGNOSIS — E538 Deficiency of other specified B group vitamins: Secondary | ICD-10-CM | POA: Diagnosis not present

## 2021-07-23 DIAGNOSIS — M6281 Muscle weakness (generalized): Secondary | ICD-10-CM | POA: Insufficient documentation

## 2021-07-23 DIAGNOSIS — R5383 Other fatigue: Secondary | ICD-10-CM

## 2021-07-23 DIAGNOSIS — R7309 Other abnormal glucose: Secondary | ICD-10-CM

## 2021-07-23 NOTE — Progress Notes (Signed)
[facility]    Provider:  Dr Jaynee Eagles Requesting Provider: Phylliss Bob, MD Primary Care Provider:  Trey Sailors, PA  CC:  upper and lower extremity weakness  HPI:  Vicki Harper is a 26 y.o. female here as requested by Phylliss Bob, MD for upper and lower extremity weakness and numbness.  She has a past medical history migraines, chronic depression, pain of knee, anxiety, asthma, back pain.  I reviewed Dr. Laurena Bering notes from [facility] orthopedic who saw her for mid to low back pain, upper and lower extremity weakness and numbness, patient has a 2-year history of pain in the back as well as prominent neurologic symptoms including upper and lower extremity weakness, she states that her mid to low back has been present for about a year now, she has not had any physical therapy, recent blood work was done by her primary care and per her review it was negative, she sees a neurologist for migraines but her weakness has not been discussed.  The pain in her back is described as stabbing sensation in the mid to lower back.  We do see patient here for migraines, last seen October 2022, per Amy Lomax's notes she continues topiramate 100mg  and amitriptyline 10mg  at bedtime. Headaches seem to be fairly well managed. She has about 2 migraines per month. Sumatriptan helps but makes her nauseous. She was recently started on gabapentin 100mg  at bedtime for back pain.  Dr. Lynann Bologna recommended physical therapy, diagnosed myofascial pain, weakness was not explained by her cervical MRI and she was asked to follow-up, per report cervical MRI normal.  Examination showed mild tenderness to palpation throughout the thoracolumbar region, negative straight leg test bilaterally, full strength and symmetric reflexes in the bilateral lower extremities she is neurovascularly intact, brisk reflexes, full range of motion of the cervical neck, mild tenderness to palpation of the cervical paraspinal  musculature strength is overall 5 out of 5 and symmetric.  They also believe the lumbar region pain is predominantly myofascial.  Again they recommended physical therapy.  She reports upper and lower extremity weakness, 7-9 months, recently worsening last 2 months, she has cramps in her legs and arms(had it in high school as well), noticed it when her baby was young 9she is one now), she would feel weak arms and in the legs when standing, muscles tightening up, more distal muscles from the knees and elbows down, feels weakness in the hands, her fingers can get numb but moving and shaking out can resolve the symptoms, worse with use, she has low back pain but no shooting pain down the legs and no neck pain or cervical radicular symptoms. No one in the family has neuromuscular disease, no muscle wasting, episodic, happens daily, resting it goes away, no rashes or lesions, symmetrical in the arms and legs, no color changes in the digits. + fatigue. Her thyroid levels has been labile, she is following with primary care. No other focal neurologic deficits, associated symptoms, inciting events or modifiable factors.no alcohol or smoking. No dizziness however she did have a pre-syncopal event after getting braces that hurt that sounds more like a vasovagal episode, her BP and pulse are on the low side, hydrate and increase salt.  Reviewed notes, labs and imaging from outside physicians, which showed cramps  I reviewed MRI of the cervical spine report, dated May 24, 2021 findings: No evidence of fracture or subluxation, no significant disc disease, visualized cord normal in signal and morphology   MRI brain  09/29/2020: normal  Review of Systems: Patient complains of symptoms per HPI as well as the following symptoms fatigue. Pertinent negatives and positives per HPI. All others negative.   Social History   Socioeconomic History   Marital status: Single    Spouse name: Not on file   Number of  children: 1   Years of education: Not on file   Highest education level: Not on file  Occupational History   Not on file  Tobacco Use   Smoking status: Never   Smokeless tobacco: Never  Vaping Use   Vaping Use: Never used  Substance and Sexual Activity   Alcohol use: No    Alcohol/week: 0.0 standard drinks   Drug use: No   Sexual activity: Yes    Birth control/protection: Injection  Other Topics Concern   Not on file  Social History Narrative   Lives  baby girl.    Right handed   Caffeine: 1 cup/day   Social Determinants of Health   Financial Resource Strain: Not on file  Food Insecurity: Not on file  Transportation Needs: Not on file  Physical Activity: Not on file  Stress: Not on file  Social Connections: Not on file  Intimate Partner Violence: Not on file    Family History  Problem Relation Age of Onset   Migraines Mother    Asthma Brother    Neuromuscular disorder Neg Hx    Autoimmune disease Neg Hx     Past Medical History:  Diagnosis Date   Anxiety    Asthma    Back pain    Depression    Headache    Migraines     Patient Active Problem List   Diagnosis Date Noted   Muscle weakness (generalized) 07/23/2021   Migraine without aura and without status migrainosus, not intractable 09/23/2020   Major depressive disorder, recurrent episode, moderate ([place]) 07/15/2020   LGSIL on Pap smear of cervix 09/14/2019   Chronic pain of left knee 11/23/2014    Past Surgical History:  Procedure Laterality Date   NO PAST SURGERIES      Current Outpatient Medications  Medication Sig Dispense Refill   Butalbital-APAP-Caffeine 50-300-40 MG CAPS Take 2 capsules by mouth every 8 (eight) hours as needed.     ferrous sulfate (FERROUSUL) 325 (65 FE) MG tablet Take 1 tablet (325 mg total) by mouth daily with breakfast. 90 tablet 1   fluticasone-salmeterol (ADVAIR HFA) 230-21 MCG/ACT inhaler Inhale 2 puffs into the lungs 2 (two) times daily.     gabapentin (NEURONTIN)  100 MG capsule Take 100 mg by mouth at bedtime.     meloxicam (MOBIC) 7.5 MG tablet Take 7.5 mg by mouth daily as needed for pain.     ondansetron (ZOFRAN-ODT) 4 MG disintegrating tablet Take 1-2 tablets (4-8 mg total) by mouth every 8 (eight) hours as needed. Take for nausea. Also take with Rizatriptan for migraine. 30 tablet 3   Rimegepant Sulfate (NURTEC) 75 MG TBDP Take 75 mg by mouth daily as needed. 8 tablet 11   sertraline (ZOLOFT) 25 MG tablet Take 1 tablet (25 mg total) by mouth daily. 30 tablet 2   tiZANidine (ZANAFLEX) 2 MG tablet Take 2-4 mg by mouth at bedtime.     No current facility-administered medications for this visit.    Allergies as of 07/23/2021   (No Known Allergies)    Vitals: BP 111/71    Pulse 60    Ht 5\' 6"  (1.676 m)    Wt 148  lb 12.8 oz (67.5 kg)    LMP 07/17/2021    BMI 24.02 kg/m  Last Weight:  Wt Readings from Last 1 Encounters:  07/23/21 148 lb 12.8 oz (67.5 kg)   Last Height:   Ht Readings from Last 1 Encounters:  07/23/21 5\' 6"  (1.676 m)     Physical exam: Exam: Gen: NAD, conversant, well nourised,well groomed                     CV: RRR, no MRG. No Carotid Bruits. No peripheral edema, warm, nontender Eyes: Conjunctivae clear without exudates or hemorrhage  Neuro: Detailed Neurologic Exam  Speech:    Speech is normal; fluent and spontaneous with normal comprehension.  Cognition:    The patient is oriented to person, place, and time;     recent and remote memory intact;     language fluent;     normal attention, concentration,     fund of knowledge Cranial Nerves:    The pupils are equal, round, and reactive to light. The fundi are flat. Visual fields are full to finger confrontation. Extraocular movements are intact. Trigeminal sensation is intact and the muscles of mastication are normal. The face is symmetric. The palate elevates in the midline. Hearing intact. Voice is normal. Shoulder shrug is normal. The tongue has normal motion  without fasciculations.   Coordination:    Normal finger to nose and heel to shin.   Gait:    Heel-toe and tandem gait are normal.   Motor Observation:    No asymmetry, no atrophy, and no involuntary movements noted. Tone:    Normal muscle tone.    Posture:    Posture is normal. normal erect    Strength:    Strength is V/V in the upper and lower limbs.      Sensation: intact to LT     Reflex Exam:  DTR's:    Deep tendon reflexes in the upper and lower extremities are symmetrical bilaterally.  3+ patellars otherwise 2+.  Toes:    The toes are downgoing bilaterally.   Clonus:    2 beats right AJ (can be normal in this age group).   Ngative mcphalen's maneuver,   Assessment/Plan: This is a patient who is generally healthy with a hx of migraines here for 7-8 months of  generalized symmetric muscle weakness, arms and legs.  Her cervical spine and low back have been evaluated by orthopedics without etiology.  Given symmetric nature highly unlikely to be in the brain.  In addition her exam is normal with very good strength.  If this were a neuromuscular disease or disorder, we would definitely see weakness or other symptoms over a 7 to 3-month timeframe.  However we will check some blood work including muscle enzymes, we will order an EMG nerve conduction study to check on her nerves and for any neuromuscular disorder since this has also occurred to her in high school, she has no family history of neuromuscular disorder or autoimmune disorders and no signs or symptoms of thus.  We can discuss an MRI of the brain but I think that would be very low yield and wasnormal in 09/29/2020.  We will see with the blood work and EMG nerve conduction study shows.  No dizziness however she did have a pre-syncopal event after getting braces that hurt that sounds more like a vasovagal episode, her BP and pulse are on the low side, hydrate and increase salt.  Orders Placed This Encounter  Procedures    CK   B12 and Folate Panel   Methylmalonic acid, serum   Magnesium   Sedimentation rate   Hemoglobin A1c   NCV with EMG(electromyography)   No orders of the defined types were placed in this encounter.   Cc: Phylliss Bob, MD,  Trey Sailors, [place]  Sarina Ill, MD  [facility] [address] [place], [place] [address]  Phone [phone] Fax [phone]  I spent over 40 minutes of face-to-face and non-face-to-face time with patient on the  1. Muscle weakness (generalized)   2. screen for diabetes   3. screen for B12 deficiency   4. Muscle cramps   5. Other fatigue    diagnosis.  This included previsit chart review, lab review, study review, order entry, electronic health record documentation, patient education on the different diagnostic and therapeutic options, counseling and coordination of care, risks and benefits of management, compliance, or risk factor reduction

## 2021-07-23 NOTE — Patient Instructions (Addendum)
Blood work EMG/NCS If negative can consider checking MRI of the brain, will discuss after emg/ncs No dizziness however she did have a pre-syncopal event after getting braces that hurt that sounds more like a vasovagal episode, her BP and pulse are on the low side, hydrate and increase salt.   Electromyoneurogram Electromyoneurogram is a test to check how well your muscles and nerves are working. This procedure includes the combined use of electromyogram (EMG) and nerve conduction study (NCS). EMG is used to evaluate muscles and the nerves that control those muscles. NCS, which is also called electroneurogram, measures how well your nerves conduct electricity. The procedures should be done together to check if your muscles and nerves are healthy. If the results of the tests are abnormal, this may indicate disease or injury, such as a neuromuscular disease or peripheral nerve damage. Tell a health care provider about: Any allergies you have. All medicines you are taking, including vitamins, herbs, eye drops, creams, and over-the-counter medicines. Any bleeding problems you have. Any surgeries you have had. Any medical conditions you have. What are the risks? Generally, this is a safe procedure. However, problems may occur, including: Bleeding or bruising. Infection where the electrodes were inserted. What happens before the test? Medicines Take all of your usually prescribed medications before this testing is performed. Do not stop your blood thinners unless advised by your prescribing physician. General instructions Your health care provider may ask you to warm the limb that will be checked with warm water, hot pack, or wrapping the limb in a blanket. Do not use lotions or creams on the same day that you will be having the procedure. What happens during the test? For EMG  Your health care provider will ask you to stay in a position so that the muscle being studied can be accessed. You will  be sitting or lying down. You may be given a medicine to numb the area (local anesthetic) and the skin will be disinfected. A very thin needle that has an electrode will be inserted into your muscle, one muscle at a time. Typically, multiple muscles are evaluated during a single study. Another small electrode will be placed on your skin near the muscle. Your health care provider will ask you to continue to remain still. The electrodes will record the electrical activity of your muscles. You may see this on a monitor or hear it in the room. After your muscles have been studied at rest, your health care provider will ask you to contract or flex your muscles. The electrodes will record the electrical activity of your muscles. Your health care provider will remove the electrodes and the electrode needle when the procedure is finished. The procedure may vary among health care providers and hospitals. For NCS  An electrode that records your nerve activity (recording electrode) will be placed on your skin by the muscle that is being studied. An electrode that is used as a reference (reference electrode) will be placed near the recording electrode. A paste or gel will be applied to your skin between the recording electrode and the reference electrode. Your nerve will be stimulated with a mild shock. The speed of the nerves and strength of response is recorded by the electrodes. Your health care provider will remove the electrodes and the gel when the procedure is finished. The procedure may vary among health care providers and hospitals. What can I expect after the test? It is up to you to get your test results. Ask your  health care provider, or the department that is doing the test, when your results will be ready. Your health care provider may: Give you medicines for any pain. Monitor the insertion sites to make sure that bleeding stops. You should be able to drive yourself to and from the  test. Discomfort can persist for a few hours after the test, but should be better the next day. Contact a health care provider if: You have swelling, redness, or drainage at any of the insertion sites. Summary Electromyoneurogram is a test to check how well your muscles and nerves are working. If the results of the tests are abnormal, this may indicate disease or injury. This is a safe procedure. However, problems may occur, such as bleeding and infection. Your health care provider will do two tests to complete this procedure. One checks your muscles (EMG) and another checks your nerves (NCS). It is up to you to get your test results. Ask your health care provider, or the department that is doing the test, when your results will be ready. This information is not intended to replace advice given to you by your health care provider. Make sure you discuss any questions you have with your health care provider. Document Revised: 02/05/2021 Document Reviewed: 01/05/2021 Elsevier Patient Education  [place].  No dizziness however she did have a pre-syncopal event after getting braces that hurt that sounds more like a vasovagal episode, her BP and pulse are on the low side, hydrate and increase salt.

## 2021-07-29 LAB — CK: Total CK: 75 U/L (ref 32–182)

## 2021-07-29 LAB — B12 AND FOLATE PANEL
Folate: 10.5 ng/mL (ref >3.0)
Vitamin B-12: 290 pg/mL (ref 232–1245)

## 2021-07-29 LAB — SEDIMENTATION RATE: Sed Rate: 9 mm/hr (ref 0–32)

## 2021-07-29 LAB — HEMOGLOBIN A1C
Est. average glucose Bld gHb Est-mCnc: 108 mg/dL
Hgb A1c MFr Bld: 5.4 % (ref 4.8–5.6)

## 2021-07-29 LAB — METHYLMALONIC ACID, SERUM: Methylmalonic Acid: 185 nmol/L (ref 0–378)

## 2021-07-29 LAB — MAGNESIUM: Magnesium: 2.2 mg/dL (ref 1.6–2.3)

## 2021-08-11 ENCOUNTER — Ambulatory Visit (INDEPENDENT_AMBULATORY_CARE_PROVIDER_SITE_OTHER): Payer: 59 | Admitting: Neurology

## 2021-08-11 DIAGNOSIS — R252 Cramp and spasm: Secondary | ICD-10-CM | POA: Diagnosis not present

## 2021-08-11 DIAGNOSIS — Z0289 Encounter for other administrative examinations: Secondary | ICD-10-CM

## 2021-08-11 MED ORDER — GABAPENTIN 300 MG PO CAPS: 300.0000 mg | ORAL_CAPSULE | Freq: Three times a day (TID) | ORAL | 3 refills | Status: DC

## 2021-08-11 NOTE — Progress Notes (Signed)
? ? ?   ?Full Name: Vicki Harper Gender: Female ?MRN #: [id number] Date of Birth: 05-29-1996 ?   ?Visit Date: 08/11/2021 12:22 ?Age: 26 Years ?Examining Physician: Sarina Ill, MD  ?Requesting Provider: Phylliss Bob, MD ?Primary Care Provider:  Trey Sailors, PA ?Height: 5 feet 6 inch 148lbs ?Patient History: Muscle cramps, weakness and numbness in the lower > upper extremities. This is a patient who is generally healthy with a hx of migraines here for 7-8 months of  generalized symmetric muscle weakness, arms and legs.  Her cervical spine and low back have been evaluated by orthopedics without etiology.  Given symmetric nature highly unlikely to be in the brain.  In addition her exam is normal with very good strength. ? ?Summary: EMG/NCS was performed on the right upper and lower extremities and thoracic paraspinals. All nerves and muscles (as indicated in the following tables) were within normal limits.   ?   ?Conclusion: This is a normal study. No electrophysiologic evidence for mononeuropathy, polyneuropathy, radiculopathy or muscle disease. Will prescribe physical therapy. ? ?Orders Placed This Encounter  ?Procedures  ? Ambulatory referral to Physical Therapy  ? ? ?Sarina Ill M.D. ? ?[facility] ?[address], [address] ?[place], [place] [address] ?Tel: [phone] ?Fax: [phone] ? ?Verbal informed consent was obtained from the patient, patient was informed of potential risk of procedure, including bruising, bleeding, hematoma formation, infection, muscle weakness, muscle pain, numbness, among others. ?   ? ?   ?[place] ?   ?Nerve / Sites Muscle Latency Ref. Amplitude Ref. Rel Amp Segments Distance Velocity Ref. Area  ?  ms ms mV mV %  cm m/s m/s mVms  ?R Median - APB  ?   Wrist APB 3.5 ?4.4 10.7 ?4.0 100 Wrist - APB 7   41.3  ?   Upper arm APB 7.0  10.9  102 Upper arm - Wrist 21 61 ?49 40.6  ?R Ulnar - ADM  ?   Wrist ADM 2.7 ?3.3 6.1 ?6.0 100 Wrist - ADM 7   21.3  ?   B.Elbow ADM 5.9   5.5  90.5 B.Elbow - Wrist 19 59 ?49 22.5  ?   A.Elbow ADM 7.6  5.1  92.2 A.Elbow - B.Elbow 10 59 ?49 20.9  ?R Peroneal - EDB  ?   Ankle EDB 3.7 ?6.5 5.8 ?2.0 100 Ankle - EDB 9   15.9  ?   Fib head EDB 9.3  5.5  95.1 Fib head - Ankle 29 52 ?44 15.9  ?   Pop fossa EDB 11.3  5.4  97.7 Pop fossa - Fib head 10 51 ?44 15.8  ?       Pop fossa - Ankle      ?R Tibial - AH  ?   Ankle AH 2.8 ?5.8 9.0 ?4.0 100 Ankle - AH 9   16.2  ?   Pop fossa AH 10.5  8.6  96 Pop fossa - Ankle 38 49 ?41 17.4  ?           ?[place] ?   ?Nerve / Sites Rec. Site Peak Lat Ref.  Amp Ref. Segments Distance Peak Diff Ref.  ?  ms ms ?V ?V  cm ms ms  ?R Sural - Ankle (Calf)  ?   Calf Ankle 3.0 ?4.4 27 ?6 Calf - Ankle 14    ?R Superficial peroneal - Ankle  ?   Lat leg Ankle 2.8 ?4.4 13 ?6 Lat leg - Ankle 14    ?  R Median, Ulnar - Transcarpal comparison  ?   Median Palm Wrist 2.0 ?2.2 76 ?35 Median Palm - Wrist 8    ?   Ulnar Palm Wrist 2.0 ?2.2 78 ?12 Ulnar Palm - Wrist 8    ?      Median Palm - Ulnar Palm  0.0 ?0.4  ?R Median - Orthodromic (Dig II, Mid palm)  ?   Dig II Wrist 2.8 ?3.4 37 ?10 Dig II - Wrist 13    ?R Ulnar - Orthodromic, (Dig V, Mid palm)  ?   Dig V Wrist 2.6 ?3.1 24 ?5 Dig V - Wrist 11    ?             ?F  Wave ?   ?Nerve F Lat Ref.  ? ms ms  ?R Tibial - AH 44.2 ?56.0  ?R Ulnar - ADM 26.1 ?32.0  ?       ?EMG Summary Table   ? Spontaneous MUAP Recruitment  ?Muscle IA Fib PSW Fasc Other Amp Dur. Poly Pattern  ?R. Deltoid Normal None None None _______ Normal Normal Normal Normal  ?R. Triceps brachii Normal None None None _______ Normal Normal Normal Normal  ?R. Biceps brachii Normal None None None _______ Normal Normal Normal Normal  ?R. Rhomboid major Normal None None None _______ Normal Normal Normal Normal  ?R. First dorsal interosseous Normal None None None _______ Normal Normal Normal Normal  ?R. Iliopsoas Normal None None None _______ Normal Normal Normal Normal  ?R. Vastus medialis Normal None None None _______ Normal Normal Normal  Normal  ?R. Gastrocnemius (Medial head) Normal None None None _______ Normal Normal Normal Normal  ?R. Tibialis anterior Normal None None None _______ Normal Normal Normal Normal  ?R. Extensor hallucis longus Normal None None None _______ Normal Normal Normal Normal  ?R. Thoracic paraspinals (mid) Normal None None None _______ Normal Normal Normal Normal  ? ?

## 2021-08-11 NOTE — Procedures (Signed)
? ? ? ?   ?Full Name: Jenafer Winterton Gender: Female ?MRN #: [id number] Date of Birth: [birth year] ?   ?Visit Date: 08/11/2021 12:22 ?Age: 26 Years ?Examining Physician: Sarina Ill, MD  ?Requesting Provider: Phylliss Bob, MD ?Primary Care Provider:  Trey Sailors, PA ?Height: 5 feet 6 inch 148lbs ?Patient History: Muscle cramps, weakness and numbness in the lower > upper extremities ? ?Summary: EMG/NCS was performed on the right upper and lower extremities and thoracic paraspinals. All nerves and muscles (as indicated in the following tables) were within normal limits.   ?   ?Conclusion: This is a normal study. No electrophysiologic evidence for mononeuropathy, polyneuropathy, radiculopathy or muscle disease. Will prescribe physical therapy. ? ?Orders Placed This Encounter  ?Procedures  ? Ambulatory referral to Physical Therapy  ? ? ?Sarina Ill M.D. ? ?[facility] ?[address], [address] ?[place], [place] [address] ?Tel: [phone] ?Fax: [phone] ? ?Verbal informed consent was obtained from the patient, patient was informed of potential risk of procedure, including bruising, bleeding, hematoma formation, infection, muscle weakness, muscle pain, numbness, among others. ?   ? ?   ?[place] ?   ?Nerve / Sites Muscle Latency Ref. Amplitude Ref. Rel Amp Segments Distance Velocity Ref. Area  ?  ms ms mV mV %  cm m/s m/s mVms  ?R Median - APB  ?   Wrist APB 3.5 ?4.4 10.7 ?4.0 100 Wrist - APB 7   41.3  ?   Upper arm APB 7.0  10.9  102 Upper arm - Wrist 21 61 ?49 40.6  ?R Ulnar - ADM  ?   Wrist ADM 2.7 ?3.3 6.1 ?6.0 100 Wrist - ADM 7   21.3  ?   B.Elbow ADM 5.9  5.5  90.5 B.Elbow - Wrist 19 59 ?49 22.5  ?   A.Elbow ADM 7.6  5.1  92.2 A.Elbow - B.Elbow 10 59 ?49 20.9  ?R Peroneal - EDB  ?   Ankle EDB 3.7 ?6.5 5.8 ?2.0 100 Ankle - EDB 9   15.9  ?   Fib head EDB 9.3  5.5  95.1 Fib head - Ankle 29 52 ?44 15.9  ?   Pop fossa EDB 11.3  5.4  97.7 Pop fossa - Fib head 10 51 ?44 15.8  ?       Pop fossa - Ankle       ?R Tibial - AH  ?   Ankle AH 2.8 ?5.8 9.0 ?4.0 100 Ankle - AH 9   16.2  ?   Pop fossa AH 10.5  8.6  96 Pop fossa - Ankle 38 49 ?41 17.4  ?           ?[place] ?   ?Nerve / Sites Rec. Site Peak Lat Ref.  Amp Ref. Segments Distance Peak Diff Ref.  ?  ms ms ?V ?V  cm ms ms  ?R Sural - Ankle (Calf)  ?   Calf Ankle 3.0 ?4.4 27 ?6 Calf - Ankle 14    ?R Superficial peroneal - Ankle  ?   Lat leg Ankle 2.8 ?4.4 13 ?6 Lat leg - Ankle 14    ?R Median, Ulnar - Transcarpal comparison  ?   Median Palm Wrist 2.0 ?2.2 76 ?35 Median Palm - Wrist 8    ?   Ulnar Palm Wrist 2.0 ?2.2 78 ?12 Ulnar Palm - Wrist 8    ?      Median Palm - Ulnar Palm  0.0 ?0.4  ?  R Median - Orthodromic (Dig II, Mid palm)  ?   Dig II Wrist 2.8 ?3.4 37 ?10 Dig II - Wrist 13    ?R Ulnar - Orthodromic, (Dig V, Mid palm)  ?   Dig V Wrist 2.6 ?3.1 24 ?5 Dig V - Wrist 11    ?             ?F  Wave ?   ?Nerve F Lat Ref.  ? ms ms  ?R Tibial - AH 44.2 ?56.0  ?R Ulnar - ADM 26.1 ?32.0  ?       ?EMG Summary Table   ? Spontaneous MUAP Recruitment  ?Muscle IA Fib PSW Fasc Other Amp Dur. Poly Pattern  ?R. Deltoid Normal None None None _______ Normal Normal Normal Normal  ?R. Triceps brachii Normal None None None _______ Normal Normal Normal Normal  ?R. Biceps brachii Normal None None None _______ Normal Normal Normal Normal  ?R. Rhomboid major Normal None None None _______ Normal Normal Normal Normal  ?R. First dorsal interosseous Normal None None None _______ Normal Normal Normal Normal  ?R. Iliopsoas Normal None None None _______ Normal Normal Normal Normal  ?R. Vastus medialis Normal None None None _______ Normal Normal Normal Normal  ?R. Gastrocnemius (Medial head) Normal None None None _______ Normal Normal Normal Normal  ?R. Tibialis anterior Normal None None None _______ Normal Normal Normal Normal  ?R. Extensor hallucis longus Normal None None None _______ Normal Normal Normal Normal  ?R. Thoracic paraspinals (mid) Normal None None None _______ Normal Normal Normal  Normal  ? ?  ?

## 2021-08-11 NOTE — Progress Notes (Signed)
See procedure note.

## 2021-08-18 ENCOUNTER — Encounter: Payer: Self-pay | Admitting: Physical Therapy

## 2021-08-18 ENCOUNTER — Other Ambulatory Visit: Payer: Self-pay

## 2021-08-18 ENCOUNTER — Ambulatory Visit: Payer: 59 | Attending: Neurology | Admitting: Physical Therapy

## 2021-08-18 DIAGNOSIS — M6281 Muscle weakness (generalized): Secondary | ICD-10-CM | POA: Diagnosis present

## 2021-08-18 DIAGNOSIS — R252 Cramp and spasm: Secondary | ICD-10-CM | POA: Diagnosis present

## 2021-08-18 NOTE — Patient Instructions (Signed)
Access Code: [email or web address] ?URL: [email or web address] ?Date: 08/18/2021 ?Prepared by: Hilda Blades ? ?Exercises ?Wrist Flexion with Resistance - 1 x daily - 2 sets - 15 reps ?Wrist Extension with Resistance - 1 x daily - 2 sets - 15 reps ?Forearm Pronation and Supination with Hammer - 1 x daily - 2 sets - 15 reps ?Standing Wrist Ulnar Deviation with Hammer - 1 x daily - 2 sets - 15 reps ?Standing Wrist Radial Deviation with Hammer - 1 x daily - 2 sets - 15 reps ?Putty Squeezes - 1 x daily - 2 sets - 10 reps ? ?

## 2021-08-18 NOTE — Therapy (Signed)
?OUTPATIENT PHYSICAL THERAPY EVALUATION ? ? ?Patient Name: Vicki Harper ?MRN: [id number] ?DOB:1995/12/18, 26 y.o., female ?Today's Date: 08/18/2021 ? ? PT End of Session - 08/18/21 1225   ? ? Visit Number 1   ? Number of Visits 8   ? Date for PT Re-Evaluation 10/13/21   ? Authorization Type UHC / UHC MCD   ? PT Start Time 1215   ? PT Stop Time 1300   ? PT Time Calculation (min) 45 min   ? Activity Tolerance Patient tolerated treatment well   ? Behavior During Therapy [facility] for tasks assessed/performed   ? ?  ?  ? ?  ? ? ?Past Medical History:  ?Diagnosis Date  ? Anxiety   ? Asthma   ? Back pain   ? Depression   ? Headache   ? Migraines   ? ?Past Surgical History:  ?Procedure Laterality Date  ? NO PAST SURGERIES    ? ?Patient Active Problem List  ? Diagnosis Date Noted  ? Muscle weakness (generalized) 07/23/2021  ? Migraine without aura and without status migrainosus, not intractable 09/23/2020  ? Major depressive disorder, recurrent episode, moderate ([place]) 07/15/2020  ? LGSIL on Pap smear of cervix 09/14/2019  ? Chronic pain of left knee 11/23/2014  ? ? ?PCP: Trey Sailors, PA ? ?REFERRING PROVIDER: Melvenia Beam, MD ? ?REFERRING DIAG: Muscle cramp ? ?THERAPY DIAG:  ?Cramp and spasm ? ?Muscle weakness (generalized) ? ?ONSET DATE: "almost a year" ? ?SUBJECTIVE:          ?SUBJECTIVE STATEMENT: ?Patient reports she has been a lot of muscle weakness to the point where her muscle will cramp. She did have nerve conduction study and EMG that came back normal. She feels like her arms will cramp when she is holding her 27 year old. She states that this began after she had her child so has been ongoing for about a year. Patient states that her symptoms are episodic, where when it happens it happens for the whole day. Patient works at [employer] and is standing majority of the time which is difficulty. Patient states that she tries to stretch especially at night because that is when she feels the most of her  cramping is at night. ? ?PERTINENT HISTORY:  ?None ? ?PAIN:  ?Are you having pain?  ?Yes: NPRS scale: 6/10 ?Pain location: Generalized arms and legs bilaterally (more distally) ?Pain description: Cramp ?Aggravating factors: More activity ?Relieving factors: Tried rubbing muscles ? ? ?PRECAUTIONS: None ? ?WEIGHT BEARING RESTRICTIONS No ? ?FALLS:  ?Has patient fallen in last 6 months? No ? ?LIVING ENVIRONMENT: ?Lives with: lives with their family ? ?OCCUPATION: Works at [employer], standing majority of the day ? ?PLOF: Independent ? ?PATIENT GOALS: Getting stronger to reduce cramping ? ? ?OBJECTIVE:  ?DIAGNOSTIC FINDINGS:  ?EMG/NCS 08/11/2021: This is a normal study. No electrophysiologic evidence for mononeuropathy, polyneuropathy, radiculopathy or muscle disease.  ?MRI brain 09/29/2020: Normal ?MRI cervical 05/24/2021: No evidence of fracture or subluxation, no significant disc disease, visualized cord normal in signal and morphology ? ?PATIENT SURVEYS:  ?FOTO 63% functional status ? ?SCREENING FOR RED FLAGS: ?Negative ? ?COGNITION: ?Overall cognitive status: Within functional limits for tasks assessed   ?  ?SENSATION: ?Light touch: WFL ? ?MUSCLE LENGTH: ?Grossly WFL ? ?POSTURE:  ?Grossly WFL ? ?PALPATION: ?Mildly tender throughout bilateral forearm musculature ? ?LUMBAR ROM:  ? Grossly WFL and non-painful ? ?UE/LE ROM: ?  Grossly WFL and non-painful ? ?MMT: ? ?LE MMT Right ?08/18/2021  Left ?08/18/2021  ?Hip flexion 4 4  ?Hip extension 4 4  ?Hip abduction 4 4  ?Knee flexion 5 5  ?Knee extension 5 5  ?Ankle dorsiflexion 5 5  ?Ankle plantarflexion 4 4  ?Ankle inversion 5 5  ?Ankle eversion 5 5  ? ?UE MMT Right ?08/18/2021 Left ?08/18/2021  ?Shoulder flexion 5 5  ?Shoulder extension 5 5  ?Shoulder abduction 5 5  ?Shoulder internal rotation 5 5  ?Shoulder external rotation 5 5  ?Elbow flexion 5 5  ?Elbow extension 5 5  ?Wrist flexion 4 4  ?Wrist extension 4 4  ?Grip 45, 57, 54 60, 58, 55  ? ? ?LUMBAR SPECIAL TESTS:  ?Not  assessed ? ?FUNCTIONAL TESTS:  ?Not assessed ? ?GAIT: ?Assistive device utilized: None ?Level of assistance: Complete Independence ?Comments: WFL ? ? ?TODAY'S TREATMENT  ?Banded wrist flexion and extension with red x 15 each ?Pronation/supination with hammer x 10 each ?Radial and ulnar deviation with hammer x 10 each ?Putty gripping x 10 ? ?PATIENT EDUCATION:  ?Education details: Exam findings, POC, HEP ?Person educated: Patient ?Education method: Explanation, Demonstration, Tactile cues, Verbal cues, and Handouts ?Education comprehension: verbalized understanding, returned demonstration, verbal cues required, tactile cues required, and needs further education ? ?HOME EXERCISE PROGRAM: ?Access Code: [email or web address] ? ? ?ASSESSMENT: ?CLINICAL IMPRESSION: ?Patient is a 26 y.o. female who was seen today for physical therapy evaluation and treatment for weakness and cramping of distal extremities with activity. Unclear etiology of cramping, but she does report mild symptoms with strength testing this visit. Patient provided with exercises for forearm/wrist/grip strengthening.  ? ? ?OBJECTIVE IMPAIRMENTS decreased strength and pain.  ? ?ACTIVITY LIMITATIONS cleaning, community activity, meal prep, occupation, and shopping.  ? ?PERSONAL FACTORS Past/current experiences and Time since onset of injury/illness/exacerbation are also affecting patient's functional outcome.  ? ? ?REHAB POTENTIAL: Good ? ?CLINICAL DECISION MAKING: Stable/uncomplicated ? ?EVALUATION COMPLEXITY: Low ? ? ?GOALS: ?Goals reviewed with patient? Yes ? ?SHORT TERM GOALS: Target date: 09/15/2021 ? ?Patient will be I with initial HEP in order to progress with therapy. ?Baseline: HEP provided at eval ?Goal status: INITIAL ? ?2.  PT will review FOTO with patient by 3rd visit in order to understand expected progress and outcome with therapy. ?Baseline: assessed at eval ?Goal status: INITIAL ? ?3.  Patient will report pain/cramping sensation </= 3/10 in order to  reduce functional limitations  ?Baseline: 6/10 at eval ?Goal status: INITIAL ? ?LONG TERM GOALS: Target date: 10/13/2021 ? ?Patient will be I with final HEP to maintain progress from PT. ?Baseline: HEP provided at eval ?Goal status: INITIAL ? ?2.  Patient will report >/= 70% status on FOTO to indicate improved functional ability. ?Baseline: 63% ?Goal status: INITIAL ? ?3.  Patient will demonstrate wrist/forearm strength grossly 5/5 MMT in order to reduce cramping sensation when she is holding her child. ?Baseline: grossly 4/5 MMT ?Goal status: INITIAL ? ?4.  Patient will report no limitations or increased pain/cramping with standing in order to improve work related activities ?Baseline: patient reports she has limitations with standing at work due to [place] ?Goal status: INITIAL ? ? ?PLAN: ?PT FREQUENCY: 1x/week ? ?PT DURATION: 8 weeks ? ?PLANNED INTERVENTIONS: Therapeutic exercises, Therapeutic activity, Neuromuscular re-education, Balance training, Gait training, Patient/Family education, Joint manipulation, Joint mobilization, Aquatic Therapy, Dry Needling, Electrical stimulation, Spinal manipulation, Spinal mobilization, Cryotherapy, Moist heat, and Manual therapy. ? ?PLAN FOR NEXT SESSION: Review HEP and progress PRN, assess hip levels/LLD, continue strength/endurance progression for distal extremities ? ? ?Hilda Blades,  PT, DPT, LAT, ATC ?08/18/21  1:37 PM ?Phone: [phone] ?Fax: [phone] ? ? ?

## 2021-08-27 ENCOUNTER — Ambulatory Visit: Payer: 59 | Admitting: Physical Therapy

## 2021-08-27 ENCOUNTER — Other Ambulatory Visit: Payer: Self-pay

## 2021-08-27 ENCOUNTER — Encounter: Payer: Self-pay | Admitting: Physical Therapy

## 2021-08-27 DIAGNOSIS — M6281 Muscle weakness (generalized): Secondary | ICD-10-CM

## 2021-08-27 DIAGNOSIS — R252 Cramp and spasm: Secondary | ICD-10-CM

## 2021-08-27 NOTE — Therapy (Signed)
?OUTPATIENT PHYSICAL THERAPY TREATMENT NOTE ? ? ?Patient Name: Vicki Harper ?MRN: [id number] ?DOB:May 28, 1996, 26 y.o., female ?Today's Date: 08/27/2021 ? ?PCP: Trey Sailors, PA ?REFERRING PROVIDER: Trey Sailors, PA ? ? PT End of Session - 08/27/21 0716   ? ? Visit Number 2   ? Number of Visits 8   ? Date for PT Re-Evaluation 10/13/21   ? Authorization Type UHC / UHC MCD   ? PT Start Time 0715   ? PT Stop Time 0800   ? PT Time Calculation (min) 45 min   ? ?  ?  ? ?  ? ? ?Past Medical History:  ?Diagnosis Date  ? Anxiety   ? Asthma   ? Back pain   ? Depression   ? Headache   ? Migraines   ? ?Past Surgical History:  ?Procedure Laterality Date  ? NO PAST SURGERIES    ? ?Patient Active Problem List  ? Diagnosis Date Noted  ? Muscle weakness (generalized) 07/23/2021  ? Migraine without aura and without status migrainosus, not intractable 09/23/2020  ? Major depressive disorder, recurrent episode, moderate ([place]) 07/15/2020  ? LGSIL on Pap smear of cervix 09/14/2019  ? Chronic pain of left knee 11/23/2014  ? ? ?PCP: Trey Sailors, PA ?  ?REFERRING PROVIDER: Melvenia Beam, MD ?  ?REFERRING DIAG: Muscle cramp ? ?THERAPY DIAG:  ?Cramp and spasm ? ?Muscle weakness (generalized) ? ?PERTINENT HISTORY: None ? ?PRECAUTIONS: None ? ?SUBJECTIVE: I have been doing the exercises as prescribed. No change in symptoms yet. No pain right now. Pain is mostly when I am walking or at night.  ? ?PAIN:  ?Are you having pain?  ?Yes: NPRS scale: 0/10 ?Pain location: Generalized arms and legs bilaterally (more distally) ?Pain description: Cramp ?Aggravating factors: More activity ?Relieving factors: Tried rubbing muscles ? ? ? ? ? ?OBJECTIVE:  ?DIAGNOSTIC FINDINGS:  ?EMG/NCS 08/11/2021: This is a normal study. No electrophysiologic evidence for mononeuropathy, polyneuropathy, radiculopathy or muscle disease.  ?MRI brain 09/29/2020: Normal ?MRI cervical 05/24/2021: No evidence of fracture or subluxation, no significant disc  disease, visualized cord normal in signal and morphology ?  ?PATIENT SURVEYS:  ?FOTO 63% functional status ?  ?SCREENING FOR RED FLAGS: ?Negative ?  ?COGNITION: ?Overall cognitive status: Within functional limits for tasks assessed               ?           ?SENSATION: ?Light touch: WFL ?  ?MUSCLE LENGTH: ?Grossly WFL ?  ?POSTURE:  ?Grossly WFL ?  ?PALPATION: ?Mildly tender throughout bilateral forearm musculature ?  ?LUMBAR ROM:  ?          Grossly WFL and non-painful ?  ?UE/LE ROM: ?                      Grossly WFL and non-painful ?  ?MMT: ?  ?LE MMT Right ?08/18/2021 Left ?08/18/2021  ?Hip flexion 4 4  ?Hip extension 4 4  ?Hip abduction 4 4  ?Knee flexion 5 5  ?Knee extension 5 5  ?Ankle dorsiflexion 5 5  ?Ankle plantarflexion 4 4  ?Ankle inversion 5 5  ?Ankle eversion 5 5  ?  ?UE MMT Right ?08/18/2021 Left ?08/18/2021  ?Shoulder flexion 5 5  ?Shoulder extension 5 5  ?Shoulder abduction 5 5  ?Shoulder internal rotation 5 5  ?Shoulder external rotation 5 5  ?Elbow flexion 5 5  ?Elbow extension 5 5  ?Wrist flexion 4  4  ?Wrist extension 4 4  ?Grip 45, 57, 54 60, 58, 55  ?  ?  ?LUMBAR SPECIAL TESTS:  ?Not assessed ?  ?FUNCTIONAL TESTS:  ?Not assessed ?  ?GAIT: ?Assistive device utilized: None ?Level of assistance: Complete Independence ?Comments: WFL ?  ? ?TODAY'S TREATMENT  ?[facility] Adult PT Treatment:                                                DATE: 08/27/21 ?Therapeutic Exercise: ?Nustep L5 UE/LE x 5 minutes  ?Supine gastroc and soleus stretch with strap ?Standing bilat gastroc stretch- plank on wall ?Standing gastroc stretch 30 sec x 2  ?Standing soleus stretch 30 sec x 2  ?S/L heel raise x 10 each ?Slant board stretch gastroc /soleus  ?Velcro pad handle turns pro/sup x 3 each bilat ?Velcro pad handle turns flex/ext x 3 each  bilat ?Wrist flexion and extension stretches 3 x 30 sec each bilat  ? ? ? ? ?INITIAL TREATMENT ?Banded wrist flexion and extension with red x 15 each ?Pronation/supination with hammer x 10  each ?Radial and ulnar deviation with hammer x 10 each ?Putty gripping x 10 ?  ?PATIENT EDUCATION:  ?Education details: Exam findings, POC, HEP ?Person educated: Patient ?Education method: Explanation, Demonstration, Tactile cues, Verbal cues, and Handouts ?Education comprehension: verbalized understanding, returned demonstration, verbal cues required, tactile cues required, and needs further education ?  ?HOME EXERCISE PROGRAM: ?Access Code: [email or web address] ?URL: [email or web address] ?Date: 08/27/2021 ?Prepared by: Hessie Diener ? ?Exercises ?Wrist Flexion with Resistance - 1 x daily - 2 sets - 15 reps ?Wrist Extension with Resistance - 1 x daily - 2 sets - 15 reps ?Forearm Pronation and Supination with Hammer - 1 x daily - 2 sets - 15 reps ?Standing Wrist Ulnar Deviation with Hammer - 1 x daily - 2 sets - 15 reps ?Standing Wrist Radial Deviation with Hammer - 1 x daily - 2 sets - 15 reps ?Putty Squeezes - 1 x daily - 2 sets - 10 reps ?Gastroc Stretch on Wall - 1 x daily - 7 x weekly - 1 sets - 3 reps - 20-30 hold ?Soleus Stretch on Wall - 1 x daily - 7 x weekly - 1 sets - 3 reps - 20-30 hold ?Seated Wrist Flexion Stretch - 1 x daily - 7 x weekly - 1 sets - 3 reps - 20-30 hold ?Seated Wrist Extension Stretch - 1 x daily - 7 x weekly - 1 sets - 3 reps - 20-30 hold ? ? ?  ?  ?ASSESSMENT: ?CLINICAL IMPRESSION: ?Pt reports compliance with HEP and no change in symptoms. Continued with distal UE strengthening and calf strengthening. Added stretches to assist in relief of cramps for calves and forearms. No pain reported during session.  ? ? ? ?  ?  ?OBJECTIVE IMPAIRMENTS decreased strength and pain.  ?  ?ACTIVITY LIMITATIONS cleaning, community activity, meal prep, occupation, and shopping.  ?  ?PERSONAL FACTORS Past/current experiences and Time since onset of injury/illness/exacerbation are also affecting patient's functional outcome.  ?  ?  ?REHAB POTENTIAL: Good ?  ?CLINICAL DECISION MAKING:  Stable/uncomplicated ?  ?EVALUATION COMPLEXITY: Low ?  ?  ?GOALS: ?Goals reviewed with patient? Yes ?  ?SHORT TERM GOALS: Target date: 09/15/2021 ?  ?Patient will be I with initial HEP in order to progress with therapy. ?Baseline: HEP provided at  eval ?Goal status: INITIAL ?  ?2.  PT will review FOTO with patient by 3rd visit in order to understand expected progress and outcome with therapy. ?Baseline: assessed at eval ?Goal status: INITIAL ?  ?3.  Patient will report pain/cramping sensation </= 3/10 in order to reduce functional limitations  ?Baseline: 6/10 at eval ?Goal status: INITIAL ?  ?LONG TERM GOALS: Target date: 10/13/2021 ?  ?Patient will be I with final HEP to maintain progress from PT. ?Baseline: HEP provided at eval ?Goal status: INITIAL ?  ?2.  Patient will report >/= 70% status on FOTO to indicate improved functional ability. ?Baseline: 63% ?Goal status: INITIAL ?  ?3.  Patient will demonstrate wrist/forearm strength grossly 5/5 MMT in order to reduce cramping sensation when she is holding her child. ?Baseline: grossly 4/5 MMT ?Goal status: INITIAL ?  ?4.  Patient will report no limitations or increased pain/cramping with standing in order to improve work related activities ?Baseline: patient reports she has limitations with standing at work due to [place] ?Goal status: INITIAL ?  ?  ?PLAN: ?PT FREQUENCY: 1x/week ?  ?PT DURATION: 8 weeks ?  ?PLANNED INTERVENTIONS: Therapeutic exercises, Therapeutic activity, Neuromuscular re-education, Balance training, Gait training, Patient/Family education, Joint manipulation, Joint mobilization, Aquatic Therapy, Dry Needling, Electrical stimulation, Spinal manipulation, Spinal mobilization, Cryotherapy, Moist heat, and Manual therapy. ?  ?PLAN FOR NEXT SESSION:  check response to stretches; Review HEP and progress PRN, assess hip levels/LLD, continue strength/endurance progression for distal extremities ?  ? ? ?Hessie Diener, PTA ?08/27/21 10:02 AM ?Phone:  [phone] ?Fax: [phone]  ? ?   ?

## 2021-09-03 ENCOUNTER — Ambulatory Visit: Payer: 59 | Admitting: Physical Therapy

## 2021-09-11 ENCOUNTER — Encounter: Payer: Self-pay | Admitting: Physical Therapy

## 2021-09-11 ENCOUNTER — Ambulatory Visit: Payer: 59 | Attending: Neurology | Admitting: Physical Therapy

## 2021-09-11 DIAGNOSIS — M6281 Muscle weakness (generalized): Secondary | ICD-10-CM | POA: Diagnosis present

## 2021-09-11 DIAGNOSIS — R252 Cramp and spasm: Secondary | ICD-10-CM | POA: Diagnosis present

## 2021-09-11 NOTE — Therapy (Signed)
?OUTPATIENT PHYSICAL THERAPY TREATMENT NOTE ? ? ?Patient Name: Vicki Harper ?MRN: [id number] ?DOB:1995-09-19, 26 y.o., female ?Today's Date: 09/11/2021 ? ?PCP: Trey Sailors, PA ?REFERRING PROVIDER: Melvenia Beam, MD ? ? PT End of Session - 09/11/21 0721   ? ? Visit Number 3   ? Number of Visits 8   ? Date for PT Re-Evaluation 10/13/21   ? Authorization Type UHC / UHC MCD   ? PT Start Time [phone]   ? PT Stop Time 0757   ? PT Time Calculation (min) 39 min   ? ?  ?  ? ?  ? ? ?Past Medical History:  ?Diagnosis Date  ? Anxiety   ? Asthma   ? Back pain   ? Depression   ? Headache   ? Migraines   ? ?Past Surgical History:  ?Procedure Laterality Date  ? NO PAST SURGERIES    ? ?Patient Active Problem List  ? Diagnosis Date Noted  ? Muscle weakness (generalized) 07/23/2021  ? Migraine without aura and without status migrainosus, not intractable 09/23/2020  ? Major depressive disorder, recurrent episode, moderate ([place]) 07/15/2020  ? LGSIL on Pap smear of cervix 09/14/2019  ? Chronic pain of left knee 11/23/2014  ? ? ?PCP: Trey Sailors, PA ?  ?REFERRING PROVIDER: Melvenia Beam, MD ?  ?REFERRING DIAG: Muscle cramp ? ?THERAPY DIAG:  ?Cramp and spasm ? ?Muscle weakness (generalized) ? ?PERTINENT HISTORY: None ? ?PRECAUTIONS: None ? ?SUBJECTIVE:The cramps have been a little more mild. I have been doing the stretches for my legs at work.   ? ?PAIN:  ?Are you having pain?  ?Yes: NPRS scale: 0/10 ?Pain location: Generalized arms and legs bilaterally (more distally) ?Pain description: Cramp ?Aggravating factors: More activity ?Relieving factors: Tried rubbing muscles ? ? ? ? ? ?OBJECTIVE:  ?DIAGNOSTIC FINDINGS:  ?EMG/NCS 08/11/2021: This is a normal study. No electrophysiologic evidence for mononeuropathy, polyneuropathy, radiculopathy or muscle disease.  ?MRI brain 09/29/2020: Normal ?MRI cervical 05/24/2021: No evidence of fracture or subluxation, no significant disc disease, visualized cord normal in signal and  morphology ?  ?PATIENT SURVEYS:  ?FOTO 63% functional status ?  ?SCREENING FOR RED FLAGS: ?Negative ?  ?COGNITION: ?Overall cognitive status: Within functional limits for tasks assessed               ?           ?SENSATION: ?Light touch: WFL ?  ?MUSCLE LENGTH: ?Grossly WFL ?  ?POSTURE:  ?Grossly WFL ?  ?PALPATION: ?Mildly tender throughout bilateral forearm musculature ?  ?LUMBAR ROM:  ?          Grossly WFL and non-painful ?  ?UE/LE ROM: ?                      Grossly WFL and non-painful ?  ?MMT: ?  ?LE MMT Right ?08/18/2021 Left ?08/18/2021  ?Hip flexion 4 4  ?Hip extension 4 4  ?Hip abduction 4 4  ?Knee flexion 5 5  ?Knee extension 5 5  ?Ankle dorsiflexion 5 5  ?Ankle plantarflexion 4 4  ?Ankle inversion 5 5  ?Ankle eversion 5 5  ?  ?UE MMT Right ?08/18/2021 Left ?08/18/2021  ?Shoulder flexion 5 5  ?Shoulder extension 5 5  ?Shoulder abduction 5 5  ?Shoulder internal rotation 5 5  ?Shoulder external rotation 5 5  ?Elbow flexion 5 5  ?Elbow extension 5 5  ?Wrist flexion 4 4  ?Wrist extension 4 4  ?Grip  45, 57, 54 60, 58, 55  ?  ?  ?LUMBAR SPECIAL TESTS:  ?Not assessed ?  ?FUNCTIONAL TESTS:  ?Not assessed ?  ?GAIT: ?Assistive device utilized: None ?Level of assistance: Complete Independence ?Comments: WFL ?  ? ?TODAY'S TREATMENT  ?[facility] Adult PT Treatment:                                                DATE: 09/11/21 ?Therapeutic Exercise: ?UBE L 2 2.5 minutes each way  ?Wrist extension stretch 3 x 30 sec each  ?Standing Forward raise 3# 10 x 2  ?Standing lateral raise 3# 10 x 2  ?Single heel raise 10 x 2 each  ?Gastroc stretch 2 x 30 sec each ?Seated wrist ext 3# 10 x 2  ?Seated wrist flexion 3# 10 x 2  ?15# bicep curl 10 x 2 ?Squat to overhead press 5# in each UE 10 x 2  ?Slant board stretch  ? ?[facility] Adult PT Treatment:                                                DATE: 08/27/21 ?Therapeutic Exercise: ?Nustep L5 UE/LE x 5 minutes  ?Supine gastroc and soleus stretch with strap ?Standing bilat gastroc stretch- plank on  wall ?Standing gastroc stretch 30 sec x 2  ?Standing soleus stretch 30 sec x 2  ?S/L heel raise x 10 each ?Slant board stretch gastroc /soleus  ?Velcro pad handle turns pro/sup x 3 each bilat ?Velcro pad handle turns flex/ext x 3 each  bilat ?Wrist flexion and extension stretches 3 x 30 sec each bilat  ? ? ? ? ?INITIAL TREATMENT ?Banded wrist flexion and extension with red x 15 each ?Pronation/supination with hammer x 10 each ?Radial and ulnar deviation with hammer x 10 each ?Putty gripping x 10 ?  ?PATIENT EDUCATION:  ?Education details: Exam findings, POC, HEP ?Person educated: Patient ?Education method: Explanation, Demonstration, Tactile cues, Verbal cues, and Handouts ?Education comprehension: verbalized understanding, returned demonstration, verbal cues required, tactile cues required, and needs further education ?  ?HOME EXERCISE PROGRAM: ?Access Code: [email or web address] ?URL: [email or web address] ?Date: 08/27/2021 ?Prepared by: Hessie Diener ? ?Exercises ?Wrist Flexion with Resistance - 1 x daily - 2 sets - 15 reps ?Wrist Extension with Resistance - 1 x daily - 2 sets - 15 reps ?Forearm Pronation and Supination with Hammer - 1 x daily - 2 sets - 15 reps ?Standing Wrist Ulnar Deviation with Hammer - 1 x daily - 2 sets - 15 reps ?Standing Wrist Radial Deviation with Hammer - 1 x daily - 2 sets - 15 reps ?Putty Squeezes - 1 x daily - 2 sets - 10 reps ?Gastroc Stretch on Wall - 1 x daily - 7 x weekly - 1 sets - 3 reps - 20-30 hold ?Soleus Stretch on Wall - 1 x daily - 7 x weekly - 1 sets - 3 reps - 20-30 hold ?Seated Wrist Flexion Stretch - 1 x daily - 7 x weekly - 1 sets - 3 reps - 20-30 hold ?Seated Wrist Extension Stretch - 1 x daily - 7 x weekly - 1 sets - 3 reps - 20-30 hold ? ? ?  ?  ?ASSESSMENT: ?CLINICAL IMPRESSION: ?Pt reports compliance with  HEP and minimal decrease in intensity of symptoms.  Continued with distal UE strengthening and calf strengthening.  No pain reported during  or after  session.  ? ? ? ?  ?  ?OBJECTIVE IMPAIRMENTS decreased strength and pain.  ?  ?ACTIVITY LIMITATIONS cleaning, community activity, meal prep, occupation, and shopping.  ?  ?PERSONAL FACTORS Past/current experiences and Time since onset of injury/illness/exacerbation are also affecting patient's functional outcome.  ?  ?  ?REHAB POTENTIAL: Good ?  ?CLINICAL DECISION MAKING: Stable/uncomplicated ?  ?EVALUATION COMPLEXITY: Low ?  ?  ?GOALS: ?Goals reviewed with patient? Yes ?  ?SHORT TERM GOALS: Target date: 09/15/2021 ?  ?Patient will be I with initial HEP in order to progress with therapy. ?Baseline: HEP provided at eval ?Goal status: INITIAL ?  ?2.  PT will review FOTO with patient by 3rd visit in order to understand expected progress and outcome with therapy. ?Baseline: assessed at eval ?Goal status: MET ?  ?3.  Patient will report pain/cramping sensation </= 3/10 in order to reduce functional limitations  ?Baseline: 6/10 at eval ?Goal status: INITIAL ?  ?LONG TERM GOALS: Target date: 10/13/2021 ?  ?Patient will be I with final HEP to maintain progress from PT. ?Baseline: HEP provided at eval ?Goal status: INITIAL ?  ?2.  Patient will report >/= 70% status on FOTO to indicate improved functional ability. ?Baseline: 63% ?Goal status: INITIAL ?  ?3.  Patient will demonstrate wrist/forearm strength grossly 5/5 MMT in order to reduce cramping sensation when she is holding her child. ?Baseline: grossly 4/5 MMT ?Goal status: INITIAL ?  ?4.  Patient will report no limitations or increased pain/cramping with standing in order to improve work related activities ?Baseline: patient reports she has limitations with standing at work due to [place] ?Goal status: INITIAL ?  ?  ?PLAN: ?PT FREQUENCY: 1x/week ?  ?PT DURATION: 8 weeks ?  ?PLANNED INTERVENTIONS: Therapeutic exercises, Therapeutic activity, Neuromuscular re-education, Balance training, Gait training, Patient/Family education, Joint manipulation, Joint mobilization,  Aquatic Therapy, Dry Needling, Electrical stimulation, Spinal manipulation, Spinal mobilization, Cryotherapy, Moist heat, and Manual therapy. ?  ?PLAN FOR NEXT SESSION:  check response to stretches; Review HEP a

## 2021-09-19 ENCOUNTER — Encounter: Payer: Self-pay | Admitting: Physical Therapy

## 2021-09-19 ENCOUNTER — Ambulatory Visit: Payer: 59 | Admitting: Physical Therapy

## 2021-09-19 DIAGNOSIS — R252 Cramp and spasm: Secondary | ICD-10-CM

## 2021-09-19 DIAGNOSIS — M6281 Muscle weakness (generalized): Secondary | ICD-10-CM

## 2021-09-19 NOTE — Therapy (Signed)
?OUTPATIENT PHYSICAL THERAPY TREATMENT NOTE ? ? ?Patient Name: Vicki Harper ?MRN: [id number] ?DOB:1996/05/10, 26 y.o., female ?Today's Date: 09/19/2021 ? ?PCP: Trey Sailors, PA ?REFERRING PROVIDER: Melvenia Beam, MD ? ? PT End of Session - 09/19/21 0719   ? ? Visit Number 4   ? Number of Visits 8   ? Date for PT Re-Evaluation 10/13/21   ? Authorization Type UHC / UHC MCD   ? PT Start Time [phone]   ? PT Stop Time 0755   ? PT Time Calculation (min) 39 min   ? ?  ?  ? ?  ? ? ?Past Medical History:  ?Diagnosis Date  ? Anxiety   ? Asthma   ? Back pain   ? Depression   ? Headache   ? Migraines   ? ?Past Surgical History:  ?Procedure Laterality Date  ? NO PAST SURGERIES    ? ?Patient Active Problem List  ? Diagnosis Date Noted  ? Muscle weakness (generalized) 07/23/2021  ? Migraine without aura and without status migrainosus, not intractable 09/23/2020  ? Major depressive disorder, recurrent episode, moderate ([place]) 07/15/2020  ? LGSIL on Pap smear of cervix 09/14/2019  ? Chronic pain of left knee 11/23/2014  ? ? ?PCP: Trey Sailors, PA ?  ?REFERRING PROVIDER: Melvenia Beam, MD ?  ?REFERRING DIAG: Muscle cramp ? ?THERAPY DIAG:  ?Cramp and spasm ? ?Muscle weakness (generalized) ? ?PERTINENT HISTORY: None ? ?PRECAUTIONS: None ? ?SUBJECTIVE: It has been going better. The cramps are less. I haven't had any cramps since last time I was here. The exercises are feeling more comfortable. No pain or cramping this morning.  ? ?PAIN:  ?Are you having pain?  ?Yes: NPRS scale: 0/10 ?Pain location: Generalized arms and legs bilaterally (more distally) ?Pain description: Cramp ?Aggravating factors: More activity ?Relieving factors: Tried rubbing muscles ? ? ? ? ? ?OBJECTIVE:  ?DIAGNOSTIC FINDINGS:  ?EMG/NCS 08/11/2021: This is a normal study. No electrophysiologic evidence for mononeuropathy, polyneuropathy, radiculopathy or muscle disease.  ?MRI brain 09/29/2020: Normal ?MRI cervical 05/24/2021: No evidence of fracture  or subluxation, no significant disc disease, visualized cord normal in signal and morphology ?  ?PATIENT SURVEYS:  ?FOTO 63% functional status ?  ?SCREENING FOR RED FLAGS: ?Negative ?  ?COGNITION: ?Overall cognitive status: Within functional limits for tasks assessed               ?           ?SENSATION: ?Light touch: WFL ?  ?MUSCLE LENGTH: ?Grossly WFL ?  ?POSTURE:  ?Grossly WFL ?  ?PALPATION: ?Mildly tender throughout bilateral forearm musculature ?  ?LUMBAR ROM:  ?          Grossly WFL and non-painful ?  ?UE/LE ROM: ?                      Grossly WFL and non-painful ?  ?MMT: ?  ?LE MMT Right ?08/18/2021 Left ?08/18/2021 Right/Left ?09/19/21  ?Hip flexion 4 4   ?Hip extension 4 4   ?Hip abduction 4 4   ?Knee flexion 5 5   ?Knee extension 5 5   ?Ankle dorsiflexion 5 5   ?Ankle plantarflexion 4 4 5/5 bilat   ?Ankle inversion 5 5   ?Ankle eversion 5 5   ?  ?UE MMT Right ?08/18/2021 Left ?08/18/2021  ?Shoulder flexion 5 5  ?Shoulder extension 5 5  ?Shoulder abduction 5 5  ?Shoulder internal rotation 5 5  ?Shoulder  external rotation 5 5  ?Elbow flexion 5 5  ?Elbow extension 5 5  ?Wrist flexion 4 4  ?Wrist extension 4 4  ?Grip 45, 57, 54 60, 58, 55  ?  ?  ?LUMBAR SPECIAL TESTS:  ?Not assessed ?  ?FUNCTIONAL TESTS:  ?Not assessed ?  ?GAIT: ?Assistive device utilized: None ?Level of assistance: Complete Independence ?Comments: WFL ?  ? ?TODAY'S TREATMENT  ?[facility] Adult PT Treatment:                                                DATE: 09/19/21 ?Therapeutic Exercise: ?UBE L 2 2.5 minutes each way  ?Single heel raise x 25 each (MMT 5/5) ?Gastroc and soleus stretch on slant board  ?Goblet squat 15#  ?Dead lift 15#  ?Bicep Curl to overhead press using one 15# dumbell  ?Hip abduction MMT 4-/5 bilat  ?Sidelying hip abduction 10 x 2 each  ?Bridge with blue band at knees x 20 ?Squat tap with blue band x 10 ? ? ? ?[facility] Adult PT Treatment:                                                DATE: 09/11/21 ?Therapeutic Exercise: ?UBE L 2 2.5  minutes each way  ?Wrist extension stretch 3 x 30 sec each  ?Standing Forward raise 3# 10 x 2  ?Standing lateral raise 3# 10 x 2  ?Single heel raise 10 x 2 each  ?Gastroc stretch 2 x 30 sec each ?Seated wrist ext 3# 10 x 2  ?Seated wrist flexion 3# 10 x 2  ?15# bicep curl 10 x 2 ?Squat to overhead press 5# in each UE 10 x 2  ?Slant board stretch  ? ?[facility] Adult PT Treatment:                                                DATE: 08/27/21 ?Therapeutic Exercise: ?Nustep L5 UE/LE x 5 minutes  ?Supine gastroc and soleus stretch with strap ?Standing bilat gastroc stretch- plank on wall ?Standing gastroc stretch 30 sec x 2  ?Standing soleus stretch 30 sec x 2  ?S/L heel raise x 10 each ?Slant board stretch gastroc /soleus  ?Velcro pad handle turns pro/sup x 3 each bilat ?Velcro pad handle turns flex/ext x 3 each  bilat ?Wrist flexion and extension stretches 3 x 30 sec each bilat  ? ? ? ? ?INITIAL TREATMENT ?Banded wrist flexion and extension with red x 15 each ?Pronation/supination with hammer x 10 each ?Radial and ulnar deviation with hammer x 10 each ?Putty gripping x 10 ?  ?PATIENT EDUCATION:  ?Education details: Exam findings, POC, HEP ?Person educated: Patient ?Education method: Explanation, Demonstration, Tactile cues, Verbal cues, and Handouts ?Education comprehension: verbalized understanding, returned demonstration, verbal cues required, tactile cues required, and needs further education ?  ?HOME EXERCISE PROGRAM: ?Access Code: [email or web address] ?URL: [email or web address] ?Date: 08/27/2021 ?Prepared by: Hessie Diener ? ?Exercises ?Wrist Flexion with Resistance - 1 x daily - 2 sets - 15 reps ?Wrist Extension with Resistance - 1 x daily - 2 sets - 15 reps ?Forearm Pronation  and Supination with Hammer - 1 x daily - 2 sets - 15 reps ?Standing Wrist Ulnar Deviation with Hammer - 1 x daily - 2 sets - 15 reps ?Standing Wrist Radial Deviation with Hammer - 1 x daily - 2 sets - 15 reps ?Putty Squeezes - 1 x daily - 2  sets - 10 reps ?Gastroc Stretch on Wall - 1 x daily - 7 x weekly - 1 sets - 3 reps - 20-30 hold ?Soleus Stretch on Wall - 1 x daily - 7 x weekly - 1 sets - 3 reps - 20-30 hold ?Seated Wrist Flexion Stretch - 1 x daily - 7 x weekly - 1 sets - 3 reps - 20-30 hold ?Seated Wrist Extension Stretch - 1 x daily - 7 x weekly - 1 sets - 3 reps - 20-30 hold ? ? ?  ?  ?ASSESSMENT: ?CLINICAL IMPRESSION: ?Pt reports compliance with HEP and notes improvement. No cramping in UE or LE since last visit -1 week ago. Improvement in standing for work duties noted. She has not been lifting or doing heavier activities to assess her feelings of weakness. Began squats and lifting today with good tolerance. With progressively load as tolerated. Ankle PF 5/5 today. She has audible knee popping without pain during squats. Added some targeted lateral hip strengthening with good tolerance as well as cues for LE joint alignment with closed chain strengthening.  She was given an updated HEP.   No pain reported during  or after session. She has one more visit scheduled, may consider one more if needed. Will recheck FOTO next session. ALL STGS MET with some LTG partially met.  ? ? ? ?  ?  ?OBJECTIVE IMPAIRMENTS decreased strength and pain.  ?  ?ACTIVITY LIMITATIONS cleaning, community activity, meal prep, occupation, and shopping.  ?  ?PERSONAL FACTORS Past/current experiences and Time since onset of injury/illness/exacerbation are also affecting patient's functional outcome.  ?  ?  ?REHAB POTENTIAL: Good ?  ?CLINICAL DECISION MAKING: Stable/uncomplicated ?  ?EVALUATION COMPLEXITY: Low ?  ?  ?GOALS: ?Goals reviewed with patient? Yes ?  ?SHORT TERM GOALS: Target date: 09/15/2021 ?  ?Patient will be I with initial HEP in order to progress with therapy. ?Baseline: HEP provided at eval ?Goal status: MET ?  ?2.  PT will review FOTO with patient by 3rd visit in order to understand expected progress and outcome with therapy. ?Baseline: assessed at  eval ?Goal status: MET ?  ?3.  Patient will report pain/cramping sensation </= 3/10 in order to reduce functional limitations  ?Baseline: 6/10 at eval ?Status 09/19/21: 0/10 for 1 week ?Goal status: MET ?  ?LONG T

## 2021-09-22 NOTE — Therapy (Addendum)
?OUTPATIENT PHYSICAL THERAPY TREATMENT NOTE ? ?DISCHARGE ? ? ?Patient Name: Vicki Harper ?MRN: [id number] ?DOB:31-May-1996, 26 y.o., female ?Today's Date: 09/23/2021 ? ?PCP: Trey Sailors, PA ?REFERRING PROVIDER: Trey Sailors, PA ? ? PT End of Session - 09/23/21 0835   ? ? Visit Number 5   ? Number of Visits 8   ? Date for PT Re-Evaluation 10/13/21   ? Authorization Type UHC / UHC MCD   ? PT Start Time 0830   ? PT Stop Time 0910   ? PT Time Calculation (min) 40 min   ? Activity Tolerance Patient tolerated treatment well   ? Behavior During Therapy [facility] for tasks assessed/performed   ? ?  ?  ? ?  ? ? ? ?Past Medical History:  ?Diagnosis Date  ? Anxiety   ? Asthma   ? Back pain   ? Depression   ? Headache   ? Migraines   ? ?Past Surgical History:  ?Procedure Laterality Date  ? NO PAST SURGERIES    ? ?Patient Active Problem List  ? Diagnosis Date Noted  ? Muscle weakness (generalized) 07/23/2021  ? Migraine without aura and without status migrainosus, not intractable 09/23/2020  ? Major depressive disorder, recurrent episode, moderate ([place]) 07/15/2020  ? LGSIL on Pap smear of cervix 09/14/2019  ? Chronic pain of left knee 11/23/2014  ? ? ?PCP: Trey Sailors, PA ?  ?REFERRING PROVIDER: Melvenia Beam, MD ?  ?REFERRING DIAG: Muscle cramp ? ?THERAPY DIAG:  ?Cramp and spasm ? ?Muscle weakness (generalized) ? ?PERTINENT HISTORY: None ? ?PRECAUTIONS: None ? ?SUBJECTIVE: Patient reports cramping has become less. She still feels it "here and there" but not as frequent as in the beginning. She sees the neurologist tomorrow for follow-up. ? ?PAIN:  ?Are you having pain?  ?Yes: NPRS scale: 0/10 ?Pain location: Generalized arms and legs bilaterally (more distally) ?Pain description: Cramp ?Aggravating factors: More activity ?Relieving factors: Tried rubbing muscles ? ?PATIENT GOALS: Getting stronger to reduce cramping ? ? ?OBJECTIVE:  ?PATIENT SURVEYS:  ?FOTO 50% functional status (63% at evaluation) ?   ?PALPATION: ?Mildly tender throughout bilateral forearm musculature ?  ?MMT: ?  ?LE MMT Right ?08/18/2021 Left ?08/18/2021 Right/Left ?09/19/21  ?Hip flexion 4 4   ?Hip extension 4 4   ?Hip abduction 4 4   ?Knee flexion 5 5   ?Knee extension 5 5   ?Ankle dorsiflexion 5 5   ?Ankle plantarflexion 4 4 5/5 bilat   ?Ankle inversion 5 5   ?Ankle eversion 5 5   ?  ?UE MMT Right ?08/18/2021 Left ?08/18/2021 R / Lt ?09/23/2021  ?Shoulder flexion 5 5   ?Shoulder extension 5 5   ?Shoulder abduction 5 5   ?Shoulder internal rotation 5 5   ?Shoulder external rotation 5 5   ?Elbow flexion 5 5   ?Elbow extension 5 5   ?Wrist flexion 4 4   ?Wrist extension 4 4   ?Grip 45, 57, 54 60, 58, 55 57.6 / 58 (avg)  ? ? ?TODAY'S TREATMENT  ?[facility] Adult PT Treatment:                                                DATE: 09/23/21 ?Therapeutic Exercise: ?UBE L3 x 4 min (2 fwd/bwd) while taking subjective ?Supinated bicep curl with bar 18# 3 x 12 ?Tricep push  down at [facility] 20# 3 x 12 ?Pronated bicep curl with bar 15# 3 x 12 ?Overhead tricep press at FM 17# 3 x 10 ?Seated wrist extension and flexion with bar 10# 3 x 10 each ?Hammer curl to overhead press with 15# 3 x 10 ?SL heel raise on edge of box 3 x 15 each ?Farmer's carry 45# x 2 lengths each ?Hex bar deadlift 75# 2 x 10 ? ? ?[facility] Adult PT Treatment:                                                DATE: 09/19/21 ?Therapeutic Exercise: ?UBE L 2 2.5 minutes each way  ?Single heel raise x 25 each (MMT 5/5) ?Gastroc and soleus stretch on slant board  ?Goblet squat 15#  ?Dead lift 15#  ?Bicep Curl to overhead press using one 15# dumbell  ?Hip abduction MMT 4-/5 bilat  ?Sidelying hip abduction 10 x 2 each  ?Bridge with blue band at knees x 20 ?Squat tap with blue band x 10 ? ?[facility] Adult PT Treatment:                                                DATE: 09/11/21 ?Therapeutic Exercise: ?UBE L 2 2.5 minutes each way  ?Wrist extension stretch 3 x 30 sec each  ?Standing Forward raise 3# 10 x 2  ?Standing lateral  raise 3# 10 x 2  ?Single heel raise 10 x 2 each  ?Gastroc stretch 2 x 30 sec each ?Seated wrist ext 3# 10 x 2  ?Seated wrist flexion 3# 10 x 2  ?15# bicep curl 10 x 2 ?Squat to overhead press 5# in each UE 10 x 2  ?Slant board stretch  ? ?[facility] Adult PT Treatment:                                                DATE: 08/27/21 ?Therapeutic Exercise: ?Nustep L5 UE/LE x 5 minutes  ?Supine gastroc and soleus stretch with strap ?Standing bilat gastroc stretch- plank on wall ?Standing gastroc stretch 30 sec x 2  ?Standing soleus stretch 30 sec x 2  ?S/L heel raise x 10 each ?Slant board stretch gastroc /soleus  ?Velcro pad handle turns pro/sup x 3 each bilat ?Velcro pad handle turns flex/ext x 3 each  bilat ?Wrist flexion and extension stretches 3 x 30 sec each bilat  ? ?PATIENT EDUCATION:  ?Education details: HEP ?Person educated: Patient ?Education method: Explanation, Demonstration, Tactile cues, Verbal cues ?Education comprehension: verbalized understanding, returned demonstration, verbal cues required, tactile cues required, and needs further education ?  ?HOME EXERCISE PROGRAM: ?Access Code: [email or web address] ? ?  ?ASSESSMENT: ?CLINICAL IMPRESSION: ?Patient tolerated therapy well with no adverse effects. Therapy continues to focus on progression of strengthening for distal extremities. She is progressing well with her strengthening exercises and denies any pain or cramping in therapy. She does exhibit improved grip strength this visit and reports overall improvement, but did report reduction in functional ability on FOTO. She will follow-up with her neurologist tomorrow (09/24/2021) and will determine whether she will continue with therapy.  No changes to HEP this visit. Overall patient has made great progress and would benefit from continued skilled PT to progress strength and activity tolerance to reduce pain and maximize functional ability. ? ? ?OBJECTIVE IMPAIRMENTS decreased strength and pain.  ?  ?ACTIVITY LIMITATIONS  cleaning, community activity, meal prep, occupation, and shopping.  ?  ?PERSONAL FACTORS Past/current experiences and Time since onset of injury/illness/exacerbation are also affecting patient's functional outcome.  ?  ?  ?GOALS: ?Goals reviewed with patient? Yes ?  ?SHORT TERM GOALS: Target date: 09/15/2021 ?  ?Patient will be I with initial HEP in order to progress with therapy. ?Baseline: HEP provided at eval ?Goal status: MET ?  ?2.  PT will review FOTO with patient by 3rd visit in order to understand expected progress and outcome with therapy. ?Baseline: assessed at eval ?Goal status: MET ?  ?3.  Patient will report pain/cramping sensation </= 3/10 in order to reduce functional limitations  ?Baseline: 6/10 at eval ?Status 09/19/21: 0/10 for 1 week ?Goal status: MET ?  ?LONG TERM GOALS: Target date: 10/13/2021 ?  ?Patient will be I with final HEP to maintain progress from PT. ?Baseline: HEP provided at eval ?Goal status: ONGOING ?  ?2.  Patient will report >/= 70% status on FOTO to indicate improved functional ability. ?Baseline: 63% ?Goal status: ONGOING ?  ?3.  Patient will demonstrate wrist/forearm strength grossly 5/5 MMT in order to reduce cramping sensation when she is holding her child. ?Baseline: grossly 4/5 MMT ?Goal status: ONGOING ?  ?4.  Patient will report no limitations or increased pain/cramping with standing in order to improve work related activities ?Baseline: patient reports she has limitations with standing at work due to [place] ?Goal status: PARTIALLY met  ?Status: no cramps in last week-09/19/21 ?  ?  ?PLAN: ?PT FREQUENCY: 1x/week ?  ?PT DURATION: 8 weeks ?  ?PLANNED INTERVENTIONS: Therapeutic exercises, Therapeutic activity, Neuromuscular re-education, Balance training, Gait training, Patient/Family education, Joint manipulation, Joint mobilization, Aquatic Therapy, Dry Needling, Electrical stimulation, Spinal manipulation, Spinal mobilization, Cryotherapy, Moist heat, and Manual  therapy. ?  ?PLAN FOR NEXT SESSION: Review HEP and progress PRN, continue strength/endurance progression for distal extremities ?  ? ? ?Hilda Blades, PT, DPT, LAT, ATC ?09/23/21  9:16 AM ?Phone: [phone] ?Fax: [phone]

## 2021-09-23 ENCOUNTER — Other Ambulatory Visit: Payer: Self-pay

## 2021-09-23 ENCOUNTER — Encounter: Payer: Self-pay | Admitting: Physical Therapy

## 2021-09-23 ENCOUNTER — Ambulatory Visit: Payer: 59 | Admitting: Physical Therapy

## 2021-09-23 DIAGNOSIS — R252 Cramp and spasm: Secondary | ICD-10-CM

## 2021-09-23 DIAGNOSIS — M6281 Muscle weakness (generalized): Secondary | ICD-10-CM

## 2021-09-24 ENCOUNTER — Encounter: Payer: Self-pay | Admitting: Family Medicine

## 2021-09-24 ENCOUNTER — Ambulatory Visit (INDEPENDENT_AMBULATORY_CARE_PROVIDER_SITE_OTHER): Payer: 59 | Admitting: Family Medicine

## 2021-09-24 VITALS — BP 117/73 | HR 78 | Ht 66.0 in | Wt 138.5 lb

## 2021-09-24 DIAGNOSIS — M6281 Muscle weakness (generalized): Secondary | ICD-10-CM | POA: Diagnosis not present

## 2021-09-24 DIAGNOSIS — R519 Headache, unspecified: Secondary | ICD-10-CM | POA: Diagnosis not present

## 2021-09-24 DIAGNOSIS — G43009 Migraine without aura, not intractable, without status migrainosus: Secondary | ICD-10-CM | POA: Diagnosis not present

## 2021-09-24 MED ORDER — ONDANSETRON 4 MG PO TBDP: 4.0000 mg | ORAL_TABLET | Freq: Three times a day (TID) | ORAL | 3 refills | Status: DC | PRN

## 2021-09-24 MED ORDER — AJOVY 225 MG/1.5ML ~~LOC~~ SOAJ: 225.0000 mg | SUBCUTANEOUS | 3 refills | Status: DC
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
## 2021-09-24 NOTE — Patient Instructions (Signed)
Below is our plan: ? ?We will start Ajovy. First dose given today. Next dose due in 30 days. We may have to switch to [employer] based on insurance if not covered. Call me with any concerns. Continue Nurtec as needed. Continue gabapentin up to '300mg'$  three times daily as needed  ? ?Please make sure you are staying well hydrated. I recommend 50-60 ounces daily. Well balanced diet and regular exercise encouraged. Consistent sleep schedule with 6-8 hours recommended.  ? ?Please continue follow up with care team as directed.  ? ?Follow up with me in 6 months  ? ?You may receive a survey regarding today's visit. I encourage you to leave honest feed back as I do use this information to improve patient care. Thank you for seeing me today!  ? ? ?

## 2021-09-25 ENCOUNTER — Encounter: Payer: 59 | Admitting: Neurology

## 2021-10-20 ENCOUNTER — Encounter: Payer: Self-pay | Admitting: Plastic Surgery

## 2021-10-20 ENCOUNTER — Encounter: Payer: Self-pay | Admitting: Family Medicine

## 2021-10-20 ENCOUNTER — Ambulatory Visit (INDEPENDENT_AMBULATORY_CARE_PROVIDER_SITE_OTHER): Payer: 59 | Admitting: Plastic Surgery

## 2021-10-20 VITALS — BP 112/73 | HR 71 | Ht 66.0 in | Wt 135.6 lb

## 2021-10-20 DIAGNOSIS — D489 Neoplasm of uncertain behavior, unspecified: Secondary | ICD-10-CM

## 2021-10-20 DIAGNOSIS — N62 Hypertrophy of breast: Secondary | ICD-10-CM | POA: Diagnosis not present

## 2021-10-20 DIAGNOSIS — Z803 Family history of malignant neoplasm of breast: Secondary | ICD-10-CM | POA: Diagnosis not present

## 2021-10-20 DIAGNOSIS — M549 Dorsalgia, unspecified: Secondary | ICD-10-CM | POA: Diagnosis not present

## 2021-10-20 DIAGNOSIS — M542 Cervicalgia: Secondary | ICD-10-CM | POA: Diagnosis not present

## 2021-10-20 NOTE — Telephone Encounter (Signed)
Submitted PA Ajovy on CMM. Key: [id number]. Waiting on determination from insurance. ?

## 2021-10-21 NOTE — Progress Notes (Signed)
? ?Referring Provider ?Trey Sailors, PA ?[phone] ?[place],  [place] [address]  ? ?CC:  ?Breast hypertrophy ? ? ?Vicki Harper is an 26 y.o. female.  ?HPI:  ? ?The patient is a 26 y.o. female with a history of mammary hyperplasia for several years.  She has extremely large breasts causing symptoms that include the following: ?Back pain in the upper and lower back, including neck pain. She pulls or pins her bra straps to provide better lift and relief of the pressure and pain. She notices relief by holding her breast up manually.  Her shoulder straps cause grooves and pain and pressure that requires padding for relief. Pain medication is sometimes required with motrin and tylenol.  Activities that are hindered by enlarged breasts include: exercise and running.  She has tried supportive clothing as well as fitted bras without improvement.     Mammogram history: None due to age had ultrasound and found thrombosed vein.  Family history of breast cancer: Maternal great aunt and maternal aunt.  Tobacco use: None.   The patient expresses the desire to pursue surgical intervention.  The patient has participated in [place] 2 to 3 months ago and has also done physical therapy recently.  She is continue to have back pain. ? ?The BMI = 21.8.  Preoperative bra size = D cup.  ? ?No Known Allergies ? ?Outpatient Encounter Medications as of 10/20/2021  ?Medication Sig  ? ARIPiprazole (ABILIFY) 5 MG tablet Take 5 mg by mouth daily.  ? ferrous sulfate (FERROUSUL) 325 (65 FE) MG tablet Take 1 tablet (325 mg total) by mouth daily with breakfast.  ? FLUoxetine (PROZAC) 20 MG tablet Take 30 mg by mouth daily.  ? fluticasone-salmeterol (ADVAIR HFA) 230-21 MCG/ACT inhaler Inhale 2 puffs into the lungs 2 (two) times daily.  ? Fremanezumab-vfrm (AJOVY) 225 MG/1.5ML SOAJ Inject 225 mg into the skin every 30 (thirty) days.  ? gabapentin (NEURONTIN) 300 MG capsule Take 1 capsule (300 mg total) by mouth 3  (three) times daily.  ? LO LOESTRIN FE 1 MG-10 MCG / 10 MCG tablet Take by mouth.  ? ondansetron (ZOFRAN-ODT) 4 MG disintegrating tablet Take 1-2 tablets (4-8 mg total) by mouth every 8 (eight) hours as needed. Take for nausea. Also take with Rizatriptan for migraine.  ? Rimegepant Sulfate (NURTEC) 75 MG TBDP Take 75 mg by mouth daily as needed.  ? tiZANidine (ZANAFLEX) 2 MG tablet Take 2-4 mg by mouth at bedtime.  ? traZODone (DESYREL) 50 MG tablet Take 50-100 mg by mouth at bedtime as needed.  ? Vitamin D, Ergocalciferol, (DRISDOL) 1.25 MG (50000 UNIT) CAPS capsule Take 50,000 Units by mouth once a week.  ? ?No facility-administered encounter medications on file as of 10/20/2021.  ?  ? ?Past Medical History:  ?Diagnosis Date  ? Anxiety   ? Asthma   ? Back pain   ? Depression   ? Headache   ? Migraines   ? ? ?Past Surgical History:  ?Procedure Laterality Date  ? NO PAST SURGERIES    ? ? ?Family History  ?Problem Relation Age of Onset  ? Migraines Mother   ? Asthma Brother   ? Neuromuscular disorder Neg Hx   ? Autoimmune disease Neg Hx   ? ? ?Social History  ? ?Social History Narrative  ? 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   ? Right handed  ? Caffeine: 1 cup/day  ?  ? ?Review of Systems ?General: Denies fevers, chills, weight loss ?  CV: Denies chest pain, shortness of breath, palpitations ? ? ?Physical Exam ? ?  10/20/2021  ?  9:29 AM 09/24/2021  ? 11:19 AM 07/23/2021  ?  8:25 AM  ?Vitals with BMI  ?Height '5\' 6"'$  '5\' 6"'$  '5\' 6"'$   ?Weight 135 lbs 10 oz 138 lbs 8 oz 148 lbs 13 oz  ?BMI 21.9 22.37 24.03  ?Systolic 400 867 619  ?Diastolic 73 73 71  ?Pulse 71 78 60  ?  ?General:  No acute distress,  Alert and oriented, Non-Toxic, Normal speech and affect ?Breast: No easily palpable breast masses on physical exam, significant breast ptosis and macromastia. ?Her breasts are extremely large and fairly symmetric with left possibly larger.  She has hyperpigmentation of the inframammary area on both sides.  The sternal to nipple distance on the right is  25 cm and the left is 26 cm.  The IMF distance is 9 cm on the right and 9 cm on the left.  Base width is 13 bilaterally. ?Assessment/Plan ?The patient has bilateral symptomatic macromastia.  She is a good candidate for a breast reduction.  She is interested in pursuing surgical treatment.  She has tried supportive garments and fitted bras with no relief.  The details of breast reduction surgery were discussed.  I explained the procedure in detail along the with the expected scars.  The risks were discussed in detail and include bleeding, infection, damage to surrounding structures, need for additional procedures, nipple loss, change in nipple sensation, persistent pain, contour irregularities and asymmetries.  I explained that breast feeding is often not possible after breast reduction surgery.  We discussed the expected postoperative course with an overall recovery period of about 1 month.  She demonstrated full understanding of all risks. The patient is interested in pursuing surgical treatment. ? ?The estimated excess breast tissue to be removed at the time of surgery = 200 grams on the left and 200 grams on the right. ?Lennice Sites ?10/21/2021, 10:02 AM  ? ? ? ? ?  ? ?

## 2021-11-06 ENCOUNTER — Ambulatory Visit
Admission: RE | Admit: 2021-11-06 | Discharge: 2021-11-06 | Disposition: A | Discharge status: Home / Self Care | Payer: 59 | Source: Ambulatory Visit | Attending: Family Medicine | Admitting: Family Medicine

## 2021-11-06 VITALS — BP 105/68 | HR 83 | Temp 98.0°F | Resp 15

## 2021-11-06 DIAGNOSIS — J029 Acute pharyngitis, unspecified: Secondary | ICD-10-CM | POA: Insufficient documentation

## 2021-11-06 LAB — POCT RAPID STREP A (OFFICE): Rapid Strep A Screen: NEGATIVE

## 2021-11-06 MED ORDER — KETOROLAC TROMETHAMINE 30 MG/ML IJ SOLN: 30.0000 mg | Freq: Once | INTRAMUSCULAR | Status: DC

## 2021-11-06 MED ORDER — IBUPROFEN 800 MG PO TABS: 800.0000 mg | ORAL_TABLET | Freq: Three times a day (TID) | ORAL | 0 refills | Status: DC | PRN

## 2021-11-06 NOTE — ED Provider Notes (Signed)
[facility]    CSN: [id number] Arrival date & time: 11/06/21  5277      History   Chief Complaint Chief Complaint  Patient presents with   Sore Throat    Entered by patient    HPI Vicki Harper is a 26 y.o. female.    Sore Throat  Here for sore throat and left ear pain that began overnight.  No fever or chills.  No cough or congestion.  No known sick contacts  Periods are irregular but she is on birth control  Past Medical History:  Diagnosis Date   Anxiety    Asthma    Back pain    Depression    Headache    Migraines     Patient Active Problem List   Diagnosis Date Noted   Muscle weakness (generalized) 07/23/2021   Migraine without aura and without status migrainosus, not intractable 09/23/2020   Major depressive disorder, recurrent episode, moderate ([place]) 07/15/2020   LGSIL on Pap smear of cervix 09/14/2019   Chronic pain of left knee 11/23/2014    Past Surgical History:  Procedure Laterality Date   NO PAST SURGERIES      OB History     Gravida  1   Para  1   Term  1   Preterm      AB      Living  1      SAB      IAB      Ectopic      Multiple  0   Live Births  1            Home Medications    Prior to Admission medications   Medication Sig Start Date End Date Taking? Authorizing Provider  ibuprofen (ADVIL) 800 MG tablet Take 1 tablet (800 mg total) by mouth every 8 (eight) hours as needed (pain). 11/06/21  Yes Barrett Henle, MD  ARIPiprazole (ABILIFY) 5 MG tablet Take 5 mg by mouth daily.    [provider]  EPINEPHrine 0.3 mg/0.3 mL IJ SOAJ injection SMARTSIG:IM As Directed PRN 11/05/21   [provider]  ferrous sulfate (FERROUSUL) 325 (65 FE) MG tablet Take 1 tablet (325 mg total) by mouth daily with breakfast. 04/15/20   Truett Mainland, DO  fluorometholone (FML) 0.1 % ophthalmic suspension SMARTSIG:In Eye(s) 09/18/21   [provider]  FLUoxetine (PROZAC) 20 MG tablet  Take 30 mg by mouth daily. 09/11/21   [provider]  fluticasone-salmeterol (ADVAIR HFA) 230-21 MCG/ACT inhaler Inhale 2 puffs into the lungs 2 (two) times daily.    [provider]  Fremanezumab-vfrm (AJOVY) 225 MG/1.5ML SOAJ Inject 225 mg into the skin every 30 (thirty) days. 09/24/21   Lomax, Amy, NP  gabapentin (NEURONTIN) 300 MG capsule Take 1 capsule (300 mg total) by mouth 3 (three) times daily. 08/11/21   Melvenia Beam, MD  LO LOESTRIN FE 1 MG-10 MCG / 10 MCG tablet Take by mouth. Patient not taking: Reported on 11/06/2021 09/11/21   [provider]  montelukast (SINGULAIR) 10 MG tablet Take 10 mg by mouth daily. 11/05/21   [provider]  Rimegepant Sulfate (NURTEC) 75 MG TBDP Take 75 mg by mouth daily as needed. 03/24/21   Lomax, Amy, NP  tiZANidine (ZANAFLEX) 2 MG tablet Take 2-4 mg by mouth at bedtime. Patient not taking: Reported on 11/06/2021    [provider]  traZODone (DESYREL) 50 MG tablet Take 50-100 mg by mouth at  bedtime as needed. 07/02/21   [provider]  Vitamin D, Ergocalciferol, (DRISDOL) 1.25 MG (50000 UNIT) CAPS capsule Take 50,000 Units by mouth once a week. 09/17/21   [provider]    Family History Family History  Problem Relation Age of Onset   Migraines Mother    Asthma Brother    Neuromuscular disorder Neg Hx    Autoimmune disease Neg Hx     Social History Social History   Tobacco Use   Smoking status: Never   Smokeless tobacco: Never  Vaping Use   Vaping Use: Never used  Substance Use Topics   Alcohol use: No    Alcohol/week: 0.0 standard drinks   Drug use: No     Allergies   Patient has no known allergies.   Review of Systems Review of Systems   Physical Exam Triage Vital Signs ED Triage Vitals  Enc Vitals Group     BP 11/06/21 1007 105/68     Pulse Rate 11/06/21 1007 83     Resp 11/06/21 1007 15     Temp 11/06/21 1007 98 F (36.7 C)     Temp Source 11/06/21 1007 Oral      SpO2 11/06/21 1007 97 %     Weight --      Height --      Head Circumference --      Peak Flow --      Pain Score 11/06/21 1004 9     Pain Loc --      Pain Edu? --      Excl. in [place]? --    No data found.  Updated Vital Signs BP 105/68   Pulse 83   Temp 98 F (36.7 C) (Oral)   Resp 15   SpO2 97%   Visual Acuity Right Eye Distance:   Left Eye Distance:   Bilateral Distance:    Right Eye Near:   Left Eye Near:    Bilateral Near:     Physical Exam Vitals reviewed.  Constitutional:      General: She is not in acute distress.    Appearance: She is not toxic-appearing.  HENT:     Ears:     Comments: Right tympanic membrane is obscured by cerumen.  Left tympanic membrane is gray and dull    Nose: Nose normal.     Mouth/Throat:     Mouth: Mucous membranes are moist.     Comments: There is erythema of both tonsils left more than right, and there is some yellow exudate on the left tonsil.  The hypertrophied 1+ Eyes:     Extraocular Movements: Extraocular movements intact.     Conjunctiva/sclera: Conjunctivae normal.     Pupils: Pupils are equal, round, and reactive to light.  Cardiovascular:     Rate and Rhythm: Normal rate and regular rhythm.     Heart sounds: No murmur heard. Pulmonary:     Effort: Pulmonary effort is normal. No respiratory distress.     Breath sounds: No wheezing, rhonchi or rales.  Chest:     Chest wall: No tenderness.  Musculoskeletal:     Cervical back: Neck supple.  Lymphadenopathy:     Cervical: No cervical adenopathy.  Skin:    Capillary Refill: Capillary refill takes less than 2 seconds.     Coloration: Skin is not jaundiced or pale.  Neurological:     General: No focal deficit present.     Mental Status: She is alert and oriented to person,  place, and time.  Psychiatric:        Behavior: Behavior normal.     UC Treatments / Results  Labs (all labs ordered are listed, but only abnormal results are displayed) Labs Reviewed   CULTURE, GROUP A STREP [facility])  POCT RAPID STREP A (OFFICE)    EKG   Radiology No results found.  Procedures Procedures (including critical care time)  Medications Ordered in UC Medications  ketorolac (TORADOL) 30 MG/ML injection 30 mg (has no administration in time range)    Initial Impression / Assessment and Plan / UC Course  I have reviewed the triage vital signs and the nursing notes.  Pertinent labs & imaging results that were available during my care of the patient were reviewed by me and considered in my medical decision making (see chart for details).     Strep is negative, so we will send throat culture and notify her if positive. Final Clinical Impressions(s) / UC Diagnoses   Final diagnoses:  Sore throat     Discharge Instructions      Your strep test is negative.  Culture of the throat will be sent, and staff will notify you if that is in turn positive.  You have been given a shot of Toradol 30 mg today.   Take ibuprofen 800 mg--1 tab every 8 hours as needed for pain.      ED Prescriptions     Medication Sig Dispense Auth. Provider   ibuprofen (ADVIL) 800 MG tablet Take 1 tablet (800 mg total) by mouth every 8 (eight) hours as needed (pain). 21 tablet Nole Robey, Gwenlyn Perking, MD      PDMP not reviewed this encounter.   Barrett Henle, MD 11/06/21 1036

## 2021-11-06 NOTE — Discharge Instructions (Addendum)
Your strep test is negative.  Culture of the throat will be sent, and staff will notify you if that is in turn positive.  You have been given a shot of Toradol 30 mg today.   Take ibuprofen 800 mg--1 tab every 8 hours as needed for pain.

## 2021-11-06 NOTE — ED Triage Notes (Signed)
Patient presents to Urgent Care with complaints of sore throat otalgia since this morning. Patient reports no otc.

## 2021-11-07 ENCOUNTER — Encounter (HOSPITAL_BASED_OUTPATIENT_CLINIC_OR_DEPARTMENT_OTHER): Payer: Self-pay | Admitting: Urology

## 2021-11-07 ENCOUNTER — Emergency Department (HOSPITAL_BASED_OUTPATIENT_CLINIC_OR_DEPARTMENT_OTHER)
Admission: EM | Admit: 2021-11-07 | Discharge: 2021-11-07 | Disposition: A | Discharge status: Home / Self Care | Payer: 59 | Attending: Emergency Medicine | Admitting: Emergency Medicine

## 2021-11-07 ENCOUNTER — Other Ambulatory Visit: Payer: Self-pay

## 2021-11-07 DIAGNOSIS — J039 Acute tonsillitis, unspecified: Secondary | ICD-10-CM | POA: Diagnosis not present

## 2021-11-07 DIAGNOSIS — B9789 Other viral agents as the cause of diseases classified elsewhere: Secondary | ICD-10-CM

## 2021-11-07 DIAGNOSIS — J029 Acute pharyngitis, unspecified: Secondary | ICD-10-CM | POA: Diagnosis present

## 2021-11-07 MED ORDER — ACETAMINOPHEN 325 MG PO TABS
650.0000 mg | ORAL_TABLET | Freq: Once | ORAL | Status: AC
  Administered 2021-11-07: 650 mg via ORAL
  Filled 2021-11-07: qty 2

## 2021-11-07 MED ORDER — LIDOCAINE VISCOUS HCL 2 % MT SOLN
15.0000 mL | Freq: Once | OROMUCOSAL | Status: AC
  Administered 2021-11-07: 15 mL via OROMUCOSAL
  Filled 2021-11-07: qty 15

## 2021-11-07 MED ORDER — DEXAMETHASONE SODIUM PHOSPHATE 10 MG/ML IJ SOLN
10.0000 mg | Freq: Once | INTRAMUSCULAR | Status: AC
  Administered 2021-11-07: 10 mg via INTRAMUSCULAR
  Filled 2021-11-07: qty 1

## 2021-11-07 NOTE — Discharge Instructions (Signed)
As we discussed, given your negative throat culture at urgent care yesterday I suspect that your symptoms are due to tonsillitis.  This is most likely viral in nature.  I have given you a shot of steroids to help with your symptoms specifically the swelling of your tonsils.  This should give you some relief and help you be able to swallow with less discomfort.  I also recommend that you take Tylenol/ibuprofen for additional pain, fevers, and swelling.  Follow-up with your primary care doctor in the next few days for continued evaluation and management of your symptoms.  Return if development of any new or worsening symptoms.

## 2021-11-07 NOTE — ED Triage Notes (Signed)
Pt states sore throat since yesterday and left ear pain, states chills as well  Pt seen at UC yest, neg covid and neg strep  Pain worse with swallowing

## 2021-11-07 NOTE — ED Provider Notes (Signed)
[facility] EMERGENCY DEPARTMENT Provider Note   CSN: [id number] Arrival date & time: 11/07/21  2015     History  Chief Complaint  Patient presents with   Fever   Sore Throat    Vicki Harper is a 26 y.o. female.  Patient with history of asthma presents today with complaints of sore throat and left ear pain.  She states that her symptoms began yesterday been persistent since onset.  She went to urgent care yesterday and had negative flu and strep swab.  She also took a COVID swab at home which was also negative.  She denies any other symptoms including fevers, chills, headache, cough, congestion, chest pain, shortness of breath.  She is able to tolerate oral intake with some pain.  No known sick contacts.  The history is provided by the patient. No language interpreter was used.  Fever Associated symptoms: ear pain and sore throat   Sore Throat      Home Medications Prior to Admission medications   Medication Sig Start Date End Date Taking? Authorizing Provider  ARIPiprazole (ABILIFY) 5 MG tablet Take 5 mg by mouth daily.    [provider]  EPINEPHrine 0.3 mg/0.3 mL IJ SOAJ injection SMARTSIG:IM As Directed PRN 11/05/21   [provider]  ferrous sulfate (FERROUSUL) 325 (65 FE) MG tablet Take 1 tablet (325 mg total) by mouth daily with breakfast. 04/15/20   Truett Mainland, DO  fluorometholone (FML) 0.1 % ophthalmic suspension SMARTSIG:In Eye(s) 09/18/21   [provider]  FLUoxetine (PROZAC) 20 MG tablet Take 30 mg by mouth daily. 09/11/21   [provider]  fluticasone-salmeterol (ADVAIR HFA) 230-21 MCG/ACT inhaler Inhale 2 puffs into the lungs 2 (two) times daily.    [provider]  Fremanezumab-vfrm (AJOVY) 225 MG/1.5ML SOAJ Inject 225 mg into the skin every 30 (thirty) days. 09/24/21   Lomax, Amy, NP  gabapentin (NEURONTIN) 300 MG capsule Take 1 capsule (300 mg total) by mouth 3 (three) times daily. 08/11/21   Melvenia Beam, MD  ibuprofen (ADVIL) 800 MG tablet Take 1 tablet (800 mg total) by mouth every 8 (eight) hours as needed (pain). 11/06/21   Banister, Gwenlyn Perking, MD  LO LOESTRIN FE 1 MG-10 MCG / 10 MCG tablet Take by mouth. Patient not taking: Reported on 11/06/2021 09/11/21   [provider]  montelukast (SINGULAIR) 10 MG tablet Take 10 mg by mouth daily. 11/05/21   [provider]  Rimegepant Sulfate (NURTEC) 75 MG TBDP Take 75 mg by mouth daily as needed. 03/24/21   Lomax, Amy, NP  tiZANidine (ZANAFLEX) 2 MG tablet Take 2-4 mg by mouth at bedtime. Patient not taking: Reported on 11/06/2021    [provider]  traZODone (DESYREL) 50 MG tablet Take 50-100 mg by mouth at bedtime as needed. 07/02/21   [provider]  Vitamin D, Ergocalciferol, (DRISDOL) 1.25 MG (50000 UNIT) CAPS capsule Take 50,000 Units by mouth once a week. 09/17/21   [provider]      Allergies    Penicillins    Review of Systems   Review of Systems  Constitutional:  Negative for fever.  HENT:  Positive for ear pain and sore throat. Negative for ear discharge, facial swelling, hearing loss, trouble swallowing and voice change.   All other systems reviewed and are negative.  Physical Exam Updated Vital Signs BP 106/71 (BP Location: Right Arm)   Pulse (!) 109   Temp 99.8 F (37.7 C) (Oral)  Resp 18   Ht '5\' 6"'$  (1.676 m)   Wt 61.7 kg   SpO2 100%   BMI 21.95 kg/m  Physical Exam Vitals and nursing note reviewed.  Constitutional:      General: She is not in acute distress.    Appearance: Normal appearance. She is well-developed and normal weight. She is not ill-appearing, toxic-appearing or diaphoretic.  HENT:     Head: Normocephalic and atraumatic.     Right Ear: Tympanic membrane and ear canal normal.     Left Ear: Tympanic membrane and ear canal normal.     Mouth/Throat:     Tonsils: No tonsillar exudate or tonsillar abscesses. 3+ on the right. 3+ on the left.   Cardiovascular:     Rate and Rhythm: Normal rate and regular rhythm.     Heart sounds: Normal heart sounds.  Pulmonary:     Effort: Pulmonary effort is normal. No respiratory distress.     Breath sounds: Normal breath sounds.  Abdominal:     Palpations: Abdomen is soft.  Musculoskeletal:        General: Normal range of motion.     Cervical back: Normal range of motion.  Skin:    General: Skin is warm and dry.  Neurological:     General: No focal deficit present.     Mental Status: She is alert.  Psychiatric:        Mood and Affect: Mood normal.        Behavior: Behavior normal.    ED Results / Procedures / Treatments   Labs (all labs ordered are listed, but only abnormal results are displayed) Labs Reviewed - No data to display  EKG None  Radiology No results found.  Procedures Procedures    Medications Ordered in ED Medications  dexamethasone (DECADRON) injection 10 mg (10 mg Intramuscular Given 11/07/21 2210)    ED Course/ Medical Decision Making/ A&P                           Medical Decision Making Risk Prescription drug management.   Pt presents today with 2 days of sore throat.  She is afebrile, nontoxic-appearing, and in no acute distress with reassuring vital signs.  She does have bilateral tonsillar swelling without exudate or cervical lymphadenopathy.  She is able to eat and drink normally with some discomfort.  Given her negative strep swab and culture at urgent care yesterday, suspect that patient's symptoms are related to tonsilitis. This is very likely viral in nature. treated in the ED with steroids with significant improvement.  Pt appears mildly dehydrated, discussed importance of water rehydration. Presentation non concerning for PTA or RPA. No trismus or uvula deviation. Specific return precautions discussed. Pt able to drink water in ED without difficulty with intact air way. Recommended PCP follow up.  Patient is understanding and amenable with  plan, discharged in stable condition.  Final Clinical Impression(s) / ED Diagnoses Final diagnoses:  Viral tonsillitis    Rx / DC Orders ED Discharge Orders     None     An After Visit Summary was printed and given to the patient.     Nestor Lewandowsky 11/07/21 2245    Malvin Johns, MD 11/07/21 2255

## 2021-11-08 LAB — CULTURE, GROUP A STREP (THRC)

## 2021-11-21 ENCOUNTER — Telehealth: Payer: Self-pay

## 2021-11-21 NOTE — Telephone Encounter (Signed)
Called patient advised insurance denied authorization due to breast issues are not related to cancer, nor trauma. Will send quote

## 2022-01-21 ENCOUNTER — Other Ambulatory Visit: Payer: Self-pay

## 2022-02-02 ENCOUNTER — Emergency Department (HOSPITAL_BASED_OUTPATIENT_CLINIC_OR_DEPARTMENT_OTHER)
Admission: EM | Admit: 2022-02-02 | Discharge: 2022-02-02 | Disposition: A | Discharge status: Home / Self Care | Payer: No Typology Code available for payment source | Attending: Emergency Medicine | Admitting: Emergency Medicine

## 2022-02-02 ENCOUNTER — Emergency Department (HOSPITAL_BASED_OUTPATIENT_CLINIC_OR_DEPARTMENT_OTHER): Payer: No Typology Code available for payment source

## 2022-02-02 ENCOUNTER — Encounter (HOSPITAL_BASED_OUTPATIENT_CLINIC_OR_DEPARTMENT_OTHER): Payer: Self-pay | Admitting: Emergency Medicine

## 2022-02-02 DIAGNOSIS — X509XXA Other and unspecified overexertion or strenuous movements or postures, initial encounter: Secondary | ICD-10-CM | POA: Insufficient documentation

## 2022-02-02 DIAGNOSIS — Y990 Civilian activity done for income or pay: Secondary | ICD-10-CM | POA: Diagnosis not present

## 2022-02-02 DIAGNOSIS — Y9302 Activity, running: Secondary | ICD-10-CM | POA: Diagnosis not present

## 2022-02-02 DIAGNOSIS — M79641 Pain in right hand: Secondary | ICD-10-CM | POA: Insufficient documentation

## 2022-02-02 NOTE — ED Notes (Signed)
Ace wrap applied to right wrist. Discharge instructions and recommendations reviewed with pt. Pt states understanding. Ambulatory upon discharge

## 2022-02-02 NOTE — ED Notes (Signed)
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

## 2022-02-02 NOTE — ED Provider Notes (Signed)
[facility] EMERGENCY DEPARTMENT Provider Note   CSN: [id number] Arrival date & time: 02/02/22  1410     History {Add pertinent medical, surgical, social history, OB history to HPI:1} Chief Complaint  Patient presents with   Hand Pain    Vicki Harper is a 26 y.o. female.   Hand Pain       Home Medications Prior to Admission medications   Medication Sig Start Date End Date Taking? Authorizing Provider  ARIPiprazole (ABILIFY) 5 MG tablet Take 5 mg by mouth daily.    [provider]  EPINEPHrine 0.3 mg/0.3 mL IJ SOAJ injection SMARTSIG:IM As Directed PRN 11/05/21   [provider]  ferrous sulfate (FERROUSUL) 325 (65 FE) MG tablet Take 1 tablet (325 mg total) by mouth daily with breakfast. 04/15/20   Truett Mainland, DO  fluorometholone (FML) 0.1 % ophthalmic suspension SMARTSIG:In Eye(s) 09/18/21   [provider]  FLUoxetine (PROZAC) 20 MG tablet Take 30 mg by mouth daily. 09/11/21   [provider]  fluticasone-salmeterol (ADVAIR HFA) 230-21 MCG/ACT inhaler Inhale 2 puffs into the lungs 2 (two) times daily.    [provider]  Fremanezumab-vfrm (AJOVY) 225 MG/1.5ML SOAJ Inject 225 mg into the skin every 30 (thirty) days. 09/24/21   Lomax, Amy, NP  gabapentin (NEURONTIN) 300 MG capsule Take 1 capsule (300 mg total) by mouth 3 (three) times daily. 08/11/21   Melvenia Beam, MD  ibuprofen (ADVIL) 800 MG tablet Take 1 tablet (800 mg total) by mouth every 8 (eight) hours as needed (pain). 11/06/21   Banister, Gwenlyn Perking, MD  LO LOESTRIN FE 1 MG-10 MCG / 10 MCG tablet Take by mouth. Patient not taking: Reported on 11/06/2021 09/11/21   [provider]  montelukast (SINGULAIR) 10 MG tablet Take 10 mg by mouth daily. 11/05/21   [provider]  Rimegepant Sulfate (NURTEC) 75 MG TBDP Take 75 mg by mouth daily as needed. 03/24/21   Lomax, Amy, NP  tiZANidine (ZANAFLEX) 2 MG tablet Take 2-4 mg by mouth at  bedtime. Patient not taking: Reported on 11/06/2021    [provider]  traZODone (DESYREL) 50 MG tablet Take 50-100 mg by mouth at bedtime as needed. 07/02/21   [provider]  Vitamin D, Ergocalciferol, (DRISDOL) 1.25 MG (50000 UNIT) CAPS capsule Take 50,000 Units by mouth once a week. 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 Pain radiates into right forearm. EXAM: RIGHT HAND - COMPLETE 3+ VIEW COMPARISON:  None Available. FINDINGS: There is no evidence of fracture or dislocation. There is no evidence of arthropathy or other focal bone abnormality. Soft tissues are unremarkable. IMPRESSION: Negative. Electronically Signed   By: Audie Pinto M.D.   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 Pain radiates into right forearm. No deformity noted. EXAM: RIGHT FOREARM - 2 VIEW COMPARISON:  None Available. FINDINGS: There is no evidence of fracture or other focal bone lesions. Soft tissues are unremarkable. IMPRESSION: Negative. Electronically Signed   By: Audie Pinto M.D.   On: 02/02/2022 14:33    Procedures Procedures  {Document cardiac monitor, telemetry assessment procedure when appropriate:1}  Medications Ordered in ED Medications - No data to display  ED Course/ Medical Decision Making/ A&P                            Medical Decision Making Amount and/or Complexity of Data Reviewed Radiology: ordered.   ***  {Document critical care time when appropriate:1} {Document review of labs and clinical decision tools ie heart score, Chads2Vasc2 etc:1}  {Document your independent review of radiology images, and any outside records:1} {Document your discussion with family members, caretakers, and with consultants:1} {Document social determinants of health affecting pt's care:1} {Document your decision making why or why not admission, treatments were needed:1} Final Clinical Impression(s) / ED Diagnoses Final diagnoses:  None    Rx / DC Orders ED Discharge Orders     None

## 2022-02-02 NOTE — ED Triage Notes (Signed)
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 Pain radiates into right forearm. No deformity noted.

## 2022-02-02 NOTE — Discharge Instructions (Addendum)
You were seen in the ER for evaluation of your right hand pain. Your imaging does not show any signs of fracture or break. You likely sprained your hand. We have placed you in an ACE wrap that you can wear. If you start to have any numbness or tingling, you can remove this. If you have continued numbness or tingling, present to the ER. You can take ibuprofen '600mg'$  and/or tylenol '1000mg'$  every 6 hours as needed for pain. I have included a note for light duty. Follow up with the hand doctor attached to this chart as needed. If you have any concerns, new or worsening symptoms, please return to the nearest ER for evaluation.   Contact a health care provider if: Your pain does not get better after a few days of self-care. Your pain gets worse. Your pain affects your ability to do your daily activities. Get help right away if: Your hand becomes warm, red, or swollen. Your hand is numb or tingling. Your hand is extremely swollen or deformed. Your hand or fingers turn white or blue. You cannot move your hand, wrist, or fingers.

## 2022-03-04 ENCOUNTER — Telehealth: Payer: Self-pay

## 2022-03-04 NOTE — Telephone Encounter (Signed)
PA for Nurtec '75MG'$  dispersible tablets submitted via CMM Key: [place] information has been sent to [facility]. Awaiting determination

## 2022-03-05 NOTE — Telephone Encounter (Signed)
NURTEC TAB '75MG'$  ODT is approved through 03/05/2023

## 2022-03-11 IMAGING — US US MFM FETAL BPP W/O NON-STRESS
1 series · 14 of 26 positions shown · non-contrast
Comparison: none

[Series 1: us mfm fetal bpp w/o non-stress · 26 acquisitions, 14 frames shown]
[im 1/26]
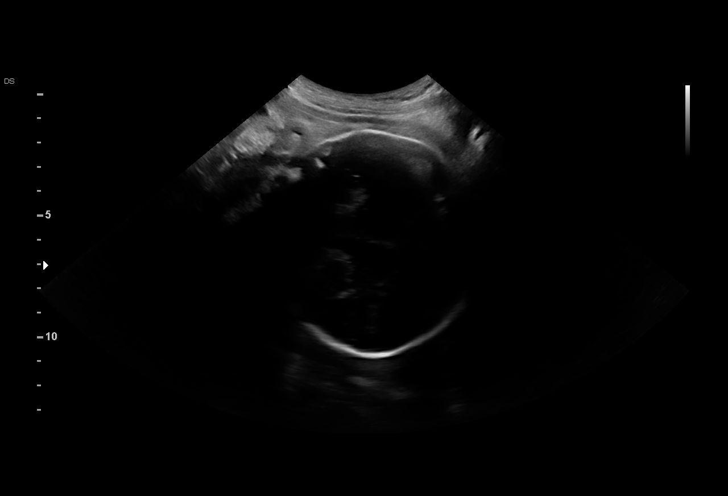
[im 3/26]
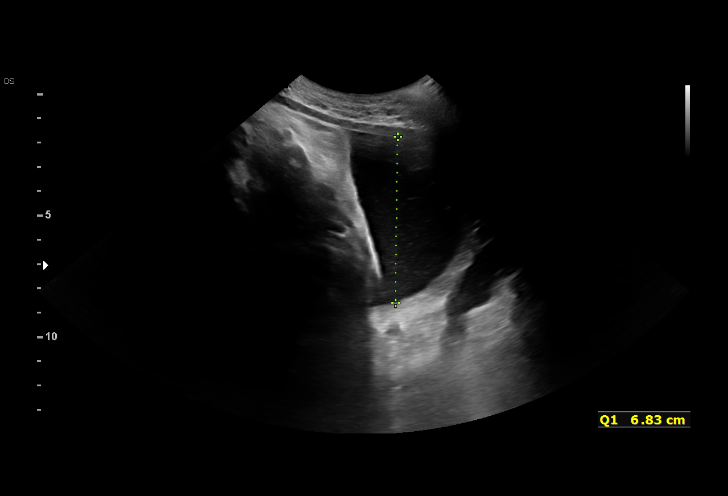
[im 5/26]
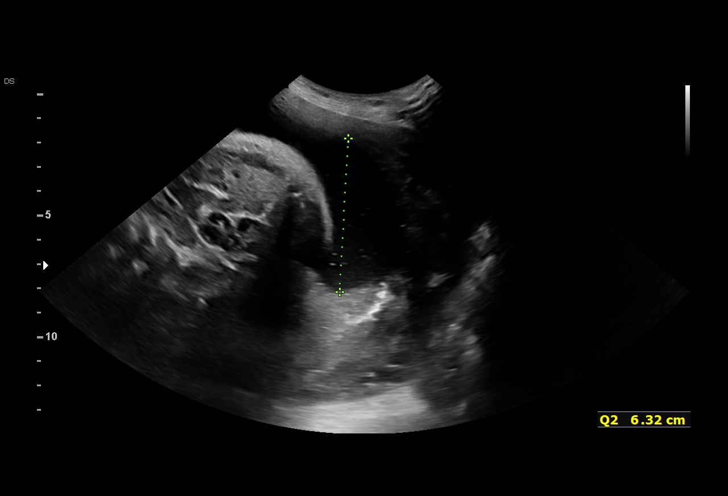
[im 7/26]
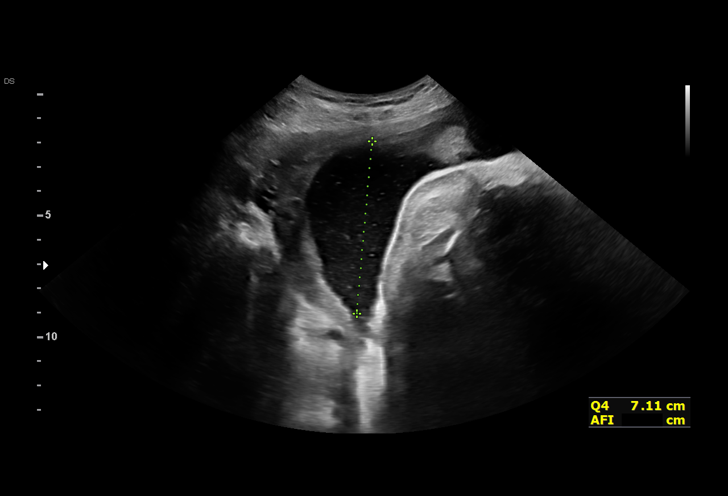
[im 9/26]
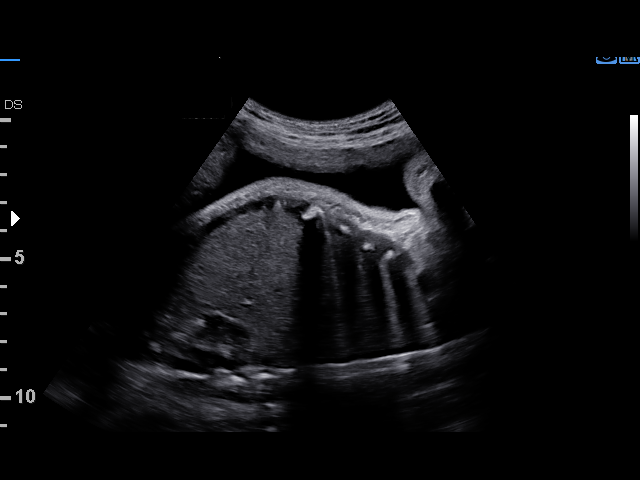
[im 11/26]
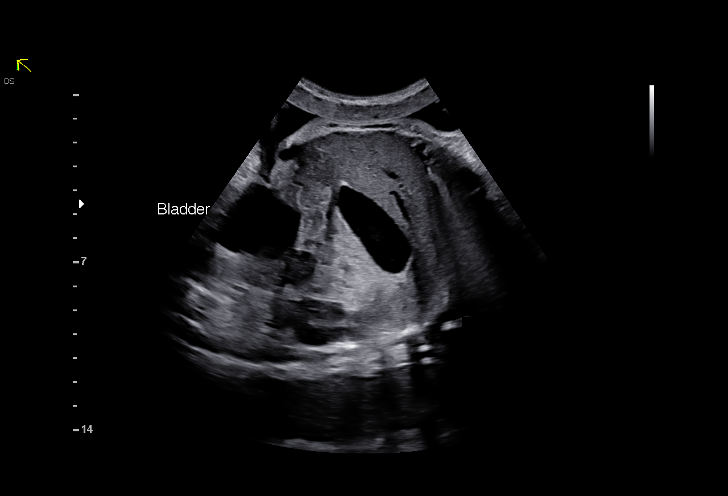
[im 13/26]
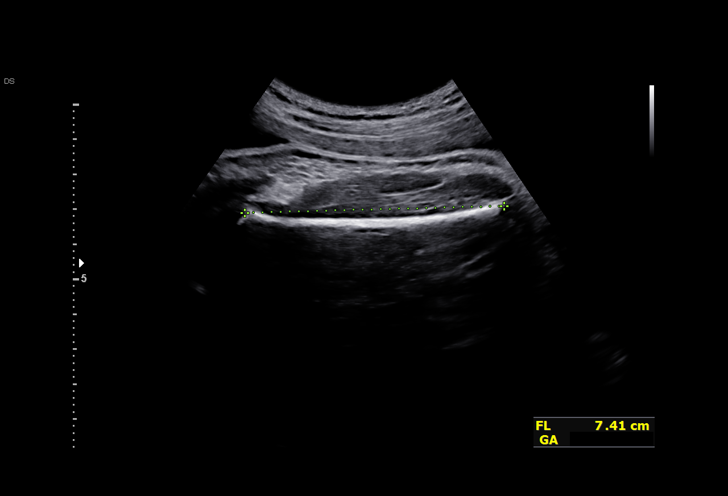
[im 14/26]
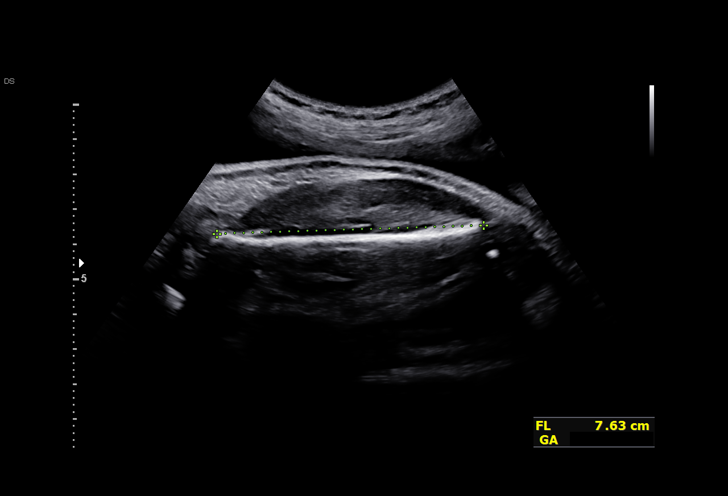
[im 16/26]
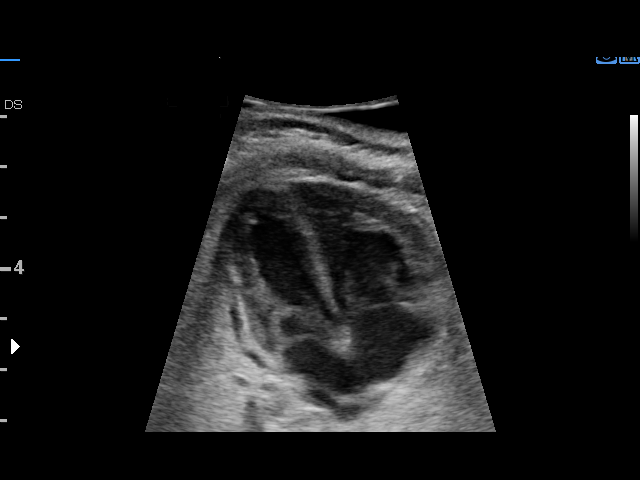
[im 18/26]
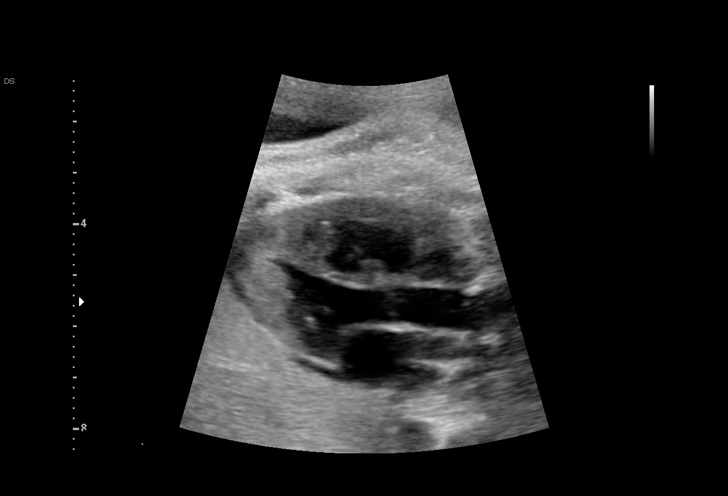
[im 20/26]
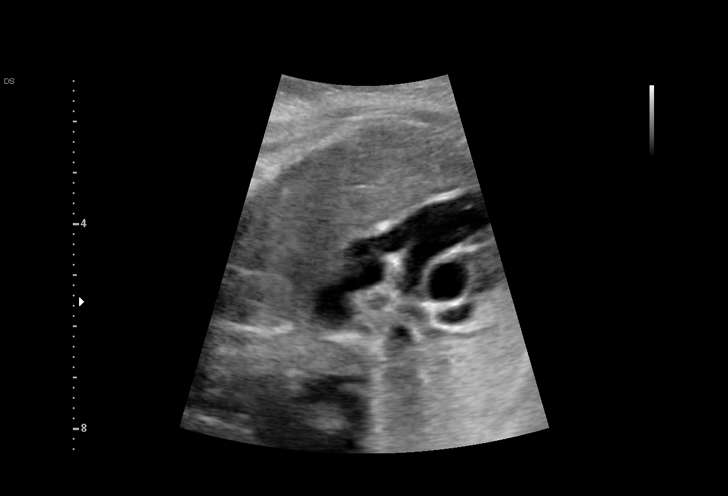
[im 22/26]
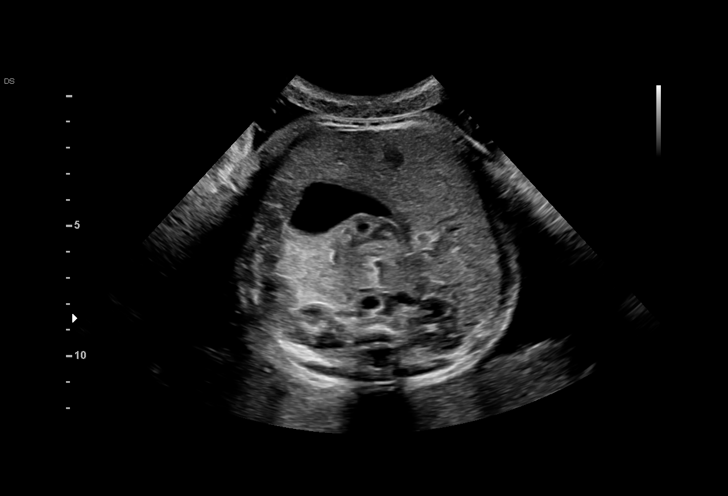
[im 24/26]
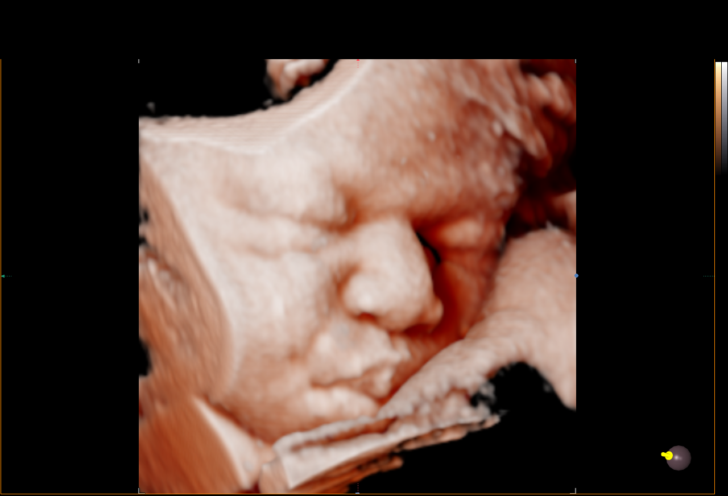
[im 26/26]
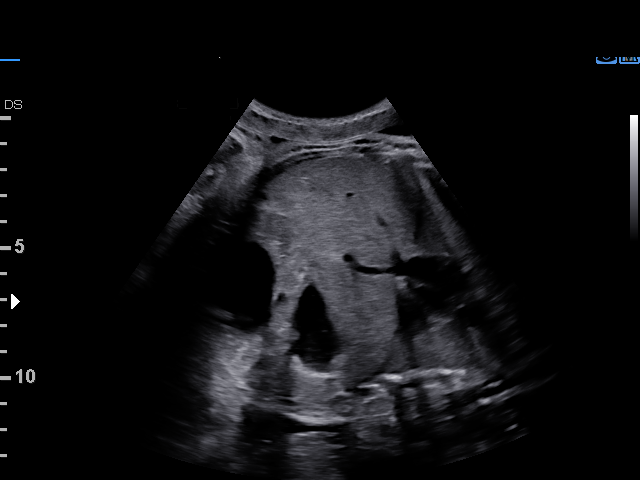

[14 of 26 positions shown; findings below may reference images not displayed]

Indications

 Postdate pregnancy (40-42 weeks)
 40 weeks gestation of pregnancy
Vital Signs

                                                Height:        5'6"
Fetal Evaluation

 Num Of Fetuses:         1
 Cardiac Activity:       Observed
 Presentation:           Cephalic
 Placenta:               Posterior
 P. Cord Insertion:      Previously Visualized

 Amniotic Fluid
 AFI FV:      Subjectively upper-normal

 AFI Sum(cm)     %Tile       Largest Pocket(cm)
 24.35           > 97

 RUQ(cm)       RLQ(cm)       LUQ(cm)        LLQ(cm)

Biophysical Evaluation

 Amniotic F.V:   Within normal limits       F. Tone:        Observed
 F. Movement:    Observed                   Score:          [DATE]
 F. Breathing:   Observed
Biometry

 BPD:      89.3  mm     G. Age:  36w 1d        4.7  %    CI:        80.12   %    70 - 86
                                                         FL/HC:      23.9   %    20.7 -
 HC:      315.2  mm     G. Age:  35w 3d        < 1  %    HC/AC:      0.86        0.87 -
 AC:      365.6  mm     G. Age:  40w 3d         84  %    FL/BPD:     84.4   %    71 - 87
 FL:       75.4  mm     G. Age:  38w 4d         24  %    FL/AC:      20.6   %    20 - 24

 Est. FW:    8627  gm           8 lb     48  %
OB History

 Gravidity:    1
Gestational Age

 LMP:           40w 1d        Date:  07/05/19                 EDD:   04/10/20
 U/S Today:     37w 5d                                        EDD:   04/27/20
 Best:          40w 1d     Det. By:  LMP  (07/05/19)          EDD:   04/10/20
Anatomy

 Face:                  Appears normal         LVOT:                   Appears normal
                        (orbits and profile)
 Lips:                  Appears normal         Stomach:                Appears normal, left
                                                                       sided
 Palate:                Appears normal         Kidneys:                Appear normal
 Heart:                 Appears normal         Bladder:                Appears normal
                        (4CH, axis, and
                        situs)
 RVOT:                  Appears normal

 Other:  Other anatomy previously imaged and appeared normal
Comments

 This patient was seen for postdates testing.  She reports
 feeling contractions every 6 to 8 minutes.  She denies any
 problems since her last exam.
 A biophysical profile performed today was [DATE].
 The estimated fetal weight obtained today of 8 pounds is
 appropriate for her gestational age.  There was normal
 amniotic fluid noted.
 Labor precautions were reviewed today.
 Follow-up as indicated

## 2022-03-28 ENCOUNTER — Other Ambulatory Visit: Payer: Self-pay

## 2022-03-28 ENCOUNTER — Emergency Department (HOSPITAL_BASED_OUTPATIENT_CLINIC_OR_DEPARTMENT_OTHER)
Admission: EM | Admit: 2022-03-28 | Discharge: 2022-03-28 | Disposition: A | Discharge status: Home / Self Care | Payer: 59 | Attending: Emergency Medicine | Admitting: Emergency Medicine

## 2022-03-28 ENCOUNTER — Encounter (HOSPITAL_BASED_OUTPATIENT_CLINIC_OR_DEPARTMENT_OTHER): Payer: Self-pay | Admitting: Pediatrics

## 2022-03-28 DIAGNOSIS — K529 Noninfective gastroenteritis and colitis, unspecified: Secondary | ICD-10-CM

## 2022-03-28 DIAGNOSIS — R112 Nausea with vomiting, unspecified: Secondary | ICD-10-CM | POA: Diagnosis present

## 2022-03-28 LAB — COMPREHENSIVE METABOLIC PANEL
ALT: 15 U/L (ref 0–44)
AST: 21 U/L (ref 15–41)
Albumin: 4.4 g/dL (ref 3.5–5.0)
Alkaline Phosphatase: 38 U/L (ref 38–126)
Anion gap: 7 (ref 5–15)
BUN: 15 mg/dL (ref 6–20)
CO2: 23 mmol/L (ref 22–32)
Calcium: 9.4 mg/dL (ref 8.9–10.3)
Chloride: 109 mmol/L (ref 98–111)
Creatinine, Ser: 0.79 mg/dL (ref 0.44–1.00)
GFR, Estimated: >60 mL/min (ref >60)
Glucose, Bld: 91 mg/dL (ref 70–99)
Potassium: 4.2 mmol/L (ref 3.5–5.1)
Sodium: 139 mmol/L (ref 135–145)
Total Bilirubin: 1 mg/dL (ref 0.3–1.2)
Total Protein: 7.8 g/dL (ref 6.5–8.1)

## 2022-03-28 LAB — PREGNANCY, URINE: Preg Test, Ur: NEGATIVE

## 2022-03-28 LAB — CBC
HCT: 42.8 % (ref 36.0–46.0)
Hemoglobin: 14 g/dL (ref 12.0–15.0)
MCH: 30.6 pg (ref 26.0–34.0)
MCHC: 32.7 g/dL (ref 30.0–36.0)
MCV: 93.4 fL (ref 80.0–100.0)
Platelets: 278 10*3/uL (ref 150–400)
RBC: 4.58 MIL/uL (ref 3.87–5.11)
RDW: 12.3 % (ref 11.5–15.5)
WBC: 7.8 10*3/uL (ref 4.0–10.5)
nRBC: 0 % (ref 0.0–0.2)

## 2022-03-28 LAB — URINALYSIS, ROUTINE W REFLEX MICROSCOPIC
Glucose, UA: NEGATIVE mg/dL
Hgb urine dipstick: NEGATIVE
Ketones, ur: 15 mg/dL — AB
Nitrite: NEGATIVE
Protein, ur: 100 mg/dL — AB
Specific Gravity, Urine: 1.02 (ref 1.005–1.030)
pH: 7.5 (ref 5.0–8.0)

## 2022-03-28 LAB — URINALYSIS, MICROSCOPIC (REFLEX)

## 2022-03-28 LAB — LIPASE, BLOOD: Lipase: 34 U/L (ref 11–51)

## 2022-03-28 MED ORDER — ONDANSETRON HCL 4 MG/2ML IJ SOLN
4.0000 mg | Freq: Once | INTRAMUSCULAR | Status: AC
  Administered 2022-03-28: 4 mg via INTRAVENOUS
  Filled 2022-03-28: qty 2

## 2022-03-28 MED ORDER — LACTATED RINGERS IV BOLUS
1000.0000 mL | Freq: Once | INTRAVENOUS | Status: AC
  Administered 2022-03-28: 1000 mL via INTRAVENOUS

## 2022-03-28 MED ORDER — ONDANSETRON HCL 4 MG PO TABS: 4.0000 mg | ORAL_TABLET | Freq: Four times a day (QID) | ORAL | 0 refills | Status: DC

## 2022-03-28 NOTE — ED Notes (Signed)
D/c paperwork reviewed with pt, including prescription. No questions or concerns at time of d/c. Ambulatory to ED exit without assistance.  

## 2022-03-28 NOTE — ED Provider Notes (Signed)
[facility] EMERGENCY DEPARTMENT Provider Note   CSN: [id number] Arrival date & time: 03/28/22  1609     History  Chief Complaint  Patient presents with   Emesis   Diarrhea   Dizziness    Vicki Harper is a 26 y.o. female.  Patient is a 26 year old female with no significant past medical history presenting to the emergency department with nausea, vomiting and diarrhea.  Patient states that she went to a new restaurant last night and several hours later woke up with nausea, vomiting and diarrhea.  She states that she has vomited multiple times today.  She states that she feels dehydrated and lightheaded.  She denies any fevers or chills, chest or abdominal pain, dysuria or hematuria.  She denies any known sick contacts.  She denies any cough, congestion or sore throat.  The history is provided by the patient.  Emesis Associated symptoms: diarrhea   Diarrhea Associated symptoms: vomiting   Dizziness Associated symptoms: diarrhea and vomiting        Home Medications Prior to Admission medications   Medication Sig Start Date End Date Taking? Authorizing Provider  ondansetron (ZOFRAN) 4 MG tablet Take 1 tablet (4 mg total) by mouth every 6 (six) hours. 03/28/22  Yes Maylon Peppers, [place] K, DO  ARIPiprazole (ABILIFY) 5 MG tablet Take 5 mg by mouth daily.    [provider]  EPINEPHrine 0.3 mg/0.3 mL IJ SOAJ injection SMARTSIG:IM As Directed PRN 11/05/21   [provider]  ferrous sulfate (FERROUSUL) 325 (65 FE) MG tablet Take 1 tablet (325 mg total) by mouth daily with breakfast. 04/15/20   Truett Mainland, DO  fluorometholone (FML) 0.1 % ophthalmic suspension SMARTSIG:In Eye(s) 09/18/21   [provider]  FLUoxetine (PROZAC) 20 MG tablet Take 30 mg by mouth daily. 09/11/21   [provider]  fluticasone-salmeterol (ADVAIR HFA) 230-21 MCG/ACT inhaler Inhale 2 puffs into the lungs 2 (two) times daily.    [provider]   Fremanezumab-vfrm (AJOVY) 225 MG/1.5ML SOAJ Inject 225 mg into the skin every 30 (thirty) days. 09/24/21   Lomax, Amy, NP  gabapentin (NEURONTIN) 300 MG capsule Take 1 capsule (300 mg total) by mouth 3 (three) times daily. 08/11/21   Melvenia Beam, MD  ibuprofen (ADVIL) 800 MG tablet Take 1 tablet (800 mg total) by mouth every 8 (eight) hours as needed (pain). 11/06/21   Banister, Gwenlyn Perking, MD  LO LOESTRIN FE 1 MG-10 MCG / 10 MCG tablet Take by mouth. Patient not taking: Reported on 11/06/2021 09/11/21   [provider]  montelukast (SINGULAIR) 10 MG tablet Take 10 mg by mouth daily. 11/05/21   [provider]  Rimegepant Sulfate (NURTEC) 75 MG TBDP Take 75 mg by mouth daily as needed. 03/24/21   Lomax, Amy, NP  tiZANidine (ZANAFLEX) 2 MG tablet Take 2-4 mg by mouth at bedtime. Patient not taking: Reported on 11/06/2021    [provider]  traZODone (DESYREL) 50 MG tablet Take 50-100 mg by mouth at bedtime as needed. 07/02/21   [provider]  Vitamin D, Ergocalciferol, (DRISDOL) 1.25 MG (50000 UNIT) CAPS capsule Take 50,000 Units by mouth once a week. 09/17/21   [provider]      Allergies    Penicillins    Review of Systems   Review of Systems  Gastrointestinal:  Positive for diarrhea and vomiting.  Neurological:  Positive for dizziness.    Physical Exam Updated Vital Signs BP 124/74   Pulse 93  Temp 97.7 F (36.5 C) (Oral)   Resp 15   Ht '5\' 6"'$  (1.676 m)   Wt 70.3 kg   SpO2 97%   BMI 25.02 kg/m  Physical Exam Vitals and nursing note reviewed.  Constitutional:      General: She is not in acute distress.    Appearance: Normal appearance.  HENT:     Head: Normocephalic and atraumatic.     Nose: Nose normal.     Mouth/Throat:     Pharynx: Oropharynx is clear.     Comments: Mildly dry lips, moist mucous membranes Eyes:     Extraocular Movements: Extraocular movements intact.     Conjunctiva/sclera: Conjunctivae normal.   Cardiovascular:     Rate and Rhythm: Normal rate and regular rhythm.     Pulses: Normal pulses.     Heart sounds: Normal heart sounds.  Pulmonary:     Effort: Pulmonary effort is normal.     Breath sounds: Normal breath sounds.  Abdominal:     General: Abdomen is flat.     Palpations: Abdomen is soft.     Tenderness: There is no abdominal tenderness.  Musculoskeletal:        General: Normal range of motion.     Cervical back: Normal range of motion and neck supple.  Skin:    General: Skin is warm.  Neurological:     General: No focal deficit present.     Mental Status: She is alert and oriented to person, place, and time.  Psychiatric:        Mood and Affect: Mood normal.        Behavior: Behavior normal.     ED Results / Procedures / Treatments   Labs (all labs ordered are listed, but only abnormal results are displayed) Labs Reviewed  URINALYSIS, ROUTINE W REFLEX MICROSCOPIC - Abnormal; Notable for the following components:      Result Value   Bilirubin Urine SMALL (*)    Ketones, ur 15 (*)    Protein, ur 100 (*)    Leukocytes,Ua TRACE (*)    All other components within normal limits  URINALYSIS, MICROSCOPIC (REFLEX) - Abnormal; Notable for the following components:   Bacteria, UA FEW (*)    All other components within normal limits  LIPASE, BLOOD  COMPREHENSIVE METABOLIC PANEL  CBC  PREGNANCY, URINE    EKG None  Radiology No results found.  Procedures Procedures    Medications Ordered in ED Medications  ondansetron [facility]) injection 4 mg (4 mg Intravenous Given 03/28/22 2007)  lactated ringers bolus 1,000 mL (1,000 mLs Intravenous New Bag/Given 03/28/22 2011)    ED Course/ Medical Decision Making/ A&P                           Medical Decision Making This patient presents to the ED with chief complaint(s) of N/V/D with no pertinent past medical history which further complicates the presenting complaint. The complaint involves an extensive  differential diagnosis and also carries with it a high risk of complications and morbidity.    The differential diagnosis includes pregnancy, dehydration, pancreatitis and hepatitis less likely as no abdominal tenderness, viral syndrome, she denies any urinary symptoms making UTI less likely  Additional history obtained: Additional history obtained from N/A Records reviewed N/A  ED Course and Reassessment: Patient was initially evaluated by triage and had labs and urine performed that showed mild ketones in the urine, otherwise within normal range.  She  did have few bacteria and trace leukocytes but denies any urinary symptoms making UTI less likely.  Patient no evidence of pancreatitis or hepatitis and pregnancy test was negative.  She was offered oral antiemetics and oral rehydration but reports that she would prefer IV rehydration and she feels like she cannot yet tolerate p.o.  Will be given IV Zofran and fluids and likely will be stable for discharge after symptomatic treatment.  Independent labs interpretation:  The following labs were independently interpreted: Mild ketones in the urine, otherwise within normal range  Independent visualization of imaging: N/A  Consultation: - Consulted or discussed management/test interpretation w/ external professional: N/A  Consideration for admission or further workup: Patient has no emergent conditions requiring admission and is stable for discharge with primary care follow-up Social Determinants of health: N/A    Amount and/or Complexity of Data Reviewed Labs: ordered.  Risk Prescription drug management.          Final Clinical Impression(s) / ED Diagnoses Final diagnoses:  Gastroenteritis  Nausea vomiting and diarrhea    Rx / DC Orders ED Discharge Orders          Ordered    ondansetron (ZOFRAN) 4 MG tablet  Every 6 hours        03/28/22 2034              Kemper Durie, DO 03/28/22 2056

## 2022-03-28 NOTE — Discharge Instructions (Addendum)
You were seen in the emergency department for your nausea, vomiting and diarrhea.  This may be due to a viral infection or could be due to food poisoning.  You only had mild dehydration on your work-up today and we gave you some fluids through the IV.  You should continue to drink plenty of fluids and a gentle diet over the next 24 to 48 hours until your symptoms improve.  You can take Zofran as needed for nausea.  You should follow-up with your primary doctor in the next few days to have your symptoms rechecked.  You should return to the emergency department if you have repetitive vomiting despite the nausea medication, you pass out, you have severe abdominal pain or if you have any other new or concerning symptoms.

## 2022-03-28 NOTE — ED Triage Notes (Signed)
C/O vomiting, diarrhea x 2 and light headedness started today; reported hx of asthma and migraines.

## 2022-03-30 ENCOUNTER — Emergency Department (HOSPITAL_BASED_OUTPATIENT_CLINIC_OR_DEPARTMENT_OTHER)
Admission: EM | Admit: 2022-03-30 | Discharge: 2022-03-30 | Disposition: A | Discharge status: Home / Self Care | Payer: 59 | Attending: Emergency Medicine | Admitting: Emergency Medicine

## 2022-03-30 ENCOUNTER — Emergency Department (HOSPITAL_BASED_OUTPATIENT_CLINIC_OR_DEPARTMENT_OTHER): Payer: 59

## 2022-03-30 DIAGNOSIS — B349 Viral infection, unspecified: Secondary | ICD-10-CM | POA: Insufficient documentation

## 2022-03-30 DIAGNOSIS — Z1152 Encounter for screening for COVID-19: Secondary | ICD-10-CM | POA: Diagnosis not present

## 2022-03-30 DIAGNOSIS — R079 Chest pain, unspecified: Secondary | ICD-10-CM | POA: Insufficient documentation

## 2022-03-30 DIAGNOSIS — R000 Tachycardia, unspecified: Secondary | ICD-10-CM | POA: Diagnosis not present

## 2022-03-30 DIAGNOSIS — J029 Acute pharyngitis, unspecified: Secondary | ICD-10-CM | POA: Diagnosis present

## 2022-03-30 LAB — CBC
HCT: 38.9 % (ref 36.0–46.0)
Hemoglobin: 12.8 g/dL (ref 12.0–15.0)
MCH: 31 pg (ref 26.0–34.0)
MCHC: 32.9 g/dL (ref 30.0–36.0)
MCV: 94.2 fL (ref 80.0–100.0)
Platelets: 235 10*3/uL (ref 150–400)
RBC: 4.13 MIL/uL (ref 3.87–5.11)
RDW: 12.2 % (ref 11.5–15.5)
WBC: 10.2 10*3/uL (ref 4.0–10.5)
nRBC: 0 % (ref 0.0–0.2)

## 2022-03-30 LAB — BASIC METABOLIC PANEL
Anion gap: 4 — ABNORMAL LOW (ref 5–15)
BUN: 14 mg/dL (ref 6–20)
CO2: 25 mmol/L (ref 22–32)
Calcium: 8.9 mg/dL (ref 8.9–10.3)
Chloride: 109 mmol/L (ref 98–111)
Creatinine, Ser: 0.98 mg/dL (ref 0.44–1.00)
GFR, Estimated: >60 mL/min (ref >60)
Glucose, Bld: 98 mg/dL (ref 70–99)
Potassium: 3.8 mmol/L (ref 3.5–5.1)
Sodium: 138 mmol/L (ref 135–145)

## 2022-03-30 LAB — PREGNANCY, URINE: Preg Test, Ur: NEGATIVE

## 2022-03-30 LAB — GROUP A STREP BY PCR: Group A Strep by PCR: DETECTED — AB

## 2022-03-30 LAB — TROPONIN I (HIGH SENSITIVITY): Troponin I (High Sensitivity): <2 ng/L (ref <18)

## 2022-03-30 LAB — SARS CORONAVIRUS 2 BY RT PCR: SARS Coronavirus 2 by RT PCR: NEGATIVE

## 2022-03-30 MED ORDER — ONDANSETRON 4 MG PO TBDP: ORAL_TABLET | ORAL | 0 refills | Status: DC

## 2022-03-30 MED ORDER — BENZONATATE 100 MG PO CAPS: 100.0000 mg | ORAL_CAPSULE | Freq: Three times a day (TID) | ORAL | 0 refills | Status: DC

## 2022-03-30 MED ORDER — AMOXICILLIN 500 MG PO CAPS: 1000.0000 mg | ORAL_CAPSULE | Freq: Two times a day (BID) | ORAL | 0 refills | Status: DC

## 2022-03-30 MED ORDER — ACETAMINOPHEN 325 MG PO TABS
650.0000 mg | ORAL_TABLET | Freq: Once | ORAL | Status: AC | PRN
  Administered 2022-03-30: 650 mg via ORAL
  Filled 2022-03-30: qty 2

## 2022-03-30 NOTE — Discharge Instructions (Signed)
Take tylenol 2 pills 4 times a day and motrin 4 pills 3 times a day.  Drink plenty of fluids.  Return for worsening shortness of breath, headache, confusion. Follow up with your family doctor.   

## 2022-03-30 NOTE — ED Triage Notes (Signed)
States last night started having sore throat, a swollen area under right axilla, body aches, chest pain & migraine.

## 2022-03-30 NOTE — ED Provider Notes (Signed)
[facility] EMERGENCY DEPARTMENT Provider Note   CSN: [id number] Arrival date & time: 03/30/22  0715     History  Chief Complaint  Patient presents with   Sore Throat   Chest Pain    Vicki Harper is a 26 y.o. female.  26 yo F with a chief complaints of cough congestion fevers chills myalgias sore throat nausea vomiting and diarrhea.  This has been going on for about 3 to 4 days now.  She was seen in the ER at the onset of the illness.  Had a benign work-up and was discharged home.  Ended up waking up this morning with worsening sore throats and had some pain under her right axilla.  She decided to come back to the ER for evaluation.  No known sick contacts.  No recent travel.   Sore Throat Associated symptoms include chest pain.  Chest Pain      Home Medications Prior to Admission medications   Medication Sig Start Date End Date Taking? Authorizing Provider  amoxicillin (AMOXIL) 500 MG capsule Take 2 capsules (1,000 mg total) by mouth 2 (two) times daily. 03/30/22  Yes Deno Etienne, DO  benzonatate (TESSALON) 100 MG capsule Take 1 capsule (100 mg total) by mouth every 8 (eight) hours. 03/30/22  Yes Deno Etienne, DO  ondansetron (ZOFRAN-ODT) 4 MG disintegrating tablet '4mg'$  ODT q4 hours prn nausea/vomit 03/30/22  Yes Tyrone Nine, Marget Outten, DO  ARIPiprazole (ABILIFY) 5 MG tablet Take 5 mg by mouth daily.    [provider]  EPINEPHrine 0.3 mg/0.3 mL IJ SOAJ injection SMARTSIG:IM As Directed PRN 11/05/21   [provider]  ferrous sulfate (FERROUSUL) 325 (65 FE) MG tablet Take 1 tablet (325 mg total) by mouth daily with breakfast. 04/15/20   Truett Mainland, DO  fluorometholone (FML) 0.1 % ophthalmic suspension SMARTSIG:In Eye(s) 09/18/21   [provider]  FLUoxetine (PROZAC) 20 MG tablet Take 30 mg by mouth daily. 09/11/21   [provider]  fluticasone-salmeterol (ADVAIR HFA) 230-21 MCG/ACT inhaler Inhale 2 puffs into the lungs 2 (two) times  daily.    [provider]  Fremanezumab-vfrm (AJOVY) 225 MG/1.5ML SOAJ Inject 225 mg into the skin every 30 (thirty) days. 09/24/21   Lomax, Amy, NP  gabapentin (NEURONTIN) 300 MG capsule Take 1 capsule (300 mg total) by mouth 3 (three) times daily. 08/11/21   Melvenia Beam, MD  ibuprofen (ADVIL) 800 MG tablet Take 1 tablet (800 mg total) by mouth every 8 (eight) hours as needed (pain). 11/06/21   Banister, Gwenlyn Perking, MD  LO LOESTRIN FE 1 MG-10 MCG / 10 MCG tablet Take by mouth. Patient not taking: Reported on 11/06/2021 09/11/21   [provider]  montelukast (SINGULAIR) 10 MG tablet Take 10 mg by mouth daily. 11/05/21   [provider]  ondansetron (ZOFRAN) 4 MG tablet Take 1 tablet (4 mg total) by mouth every 6 (six) hours. 03/28/22   Leanord Asal K, DO  Rimegepant Sulfate (NURTEC) 75 MG TBDP Take 75 mg by mouth daily as needed. 03/24/21   Lomax, Amy, NP  tiZANidine (ZANAFLEX) 2 MG tablet Take 2-4 mg by mouth at bedtime. Patient not taking: Reported on 11/06/2021    [provider]  traZODone (DESYREL) 50 MG tablet Take 50-100 mg by mouth at bedtime as needed. 07/02/21   [provider]  Vitamin D, Ergocalciferol, (DRISDOL) 1.25 MG (50000 UNIT) CAPS capsule Take 50,000 Units by mouth once a week. 09/17/21   [provider]  Allergies    Penicillins    Review of Systems   Review of Systems  Cardiovascular:  Positive for chest pain.    Physical Exam Updated Vital Signs BP 112/69 (BP Location: Left Arm)   Pulse 96   Temp (!) 101.1 F (38.4 C) (Oral)   Resp 16   Ht '5\' 6"'$  (1.676 m)   Wt 70.3 kg   SpO2 100%   BMI 25.02 kg/m  Physical Exam Vitals and nursing note reviewed.  Constitutional:      General: She is not in acute distress.    Appearance: She is well-developed. She is not diaphoretic.  HENT:     Head: Normocephalic and atraumatic.     Comments: Swollen turbinates, posterior nasal drip, no noted sinus ttp, tm normal  bilaterally.      Mouth/Throat:     Tonsils: No tonsillar exudate. 1+ on the right. 1+ on the left.  Eyes:     Pupils: Pupils are equal, round, and reactive to light.  Cardiovascular:     Rate and Rhythm: Normal rate and regular rhythm.     Heart sounds: No murmur heard.    No friction rub. No gallop.  Pulmonary:     Effort: Pulmonary effort is normal.     Breath sounds: No wheezing or rales.  Abdominal:     General: There is no distension.     Palpations: Abdomen is soft.     Tenderness: There is no abdominal tenderness.  Musculoskeletal:        General: No tenderness.     Cervical back: Normal range of motion and neck supple.     Comments: Tenderness to the right axilla without any obvious lymphadenopathy or erythema.  Skin:    General: Skin is warm and dry.  Neurological:     Mental Status: She is alert and oriented to person, place, and time.  Psychiatric:        Behavior: Behavior normal.     ED Results / Procedures / Treatments   Labs (all labs ordered are listed, but only abnormal results are displayed) Labs Reviewed  GROUP A STREP BY PCR - Abnormal; Notable for the following components:      Result Value   Group A Strep by PCR DETECTED (*)    All other components within normal limits  BASIC METABOLIC PANEL - Abnormal; Notable for the following components:   Anion gap 4 (*)    All other components within normal limits  SARS CORONAVIRUS 2 BY RT PCR  CBC  PREGNANCY, URINE  TROPONIN I (HIGH SENSITIVITY)    EKG EKG Interpretation  Date/Time:  Monday March 30 2022 07:25:50 EDT Ventricular Rate:  103 PR Interval:  156 QRS Duration: 66 QT Interval:  334 QTC Calculation: 437 R Axis:   65 Text Interpretation: Sinus tachycardia Otherwise normal ECG No significant change since last tracing Confirmed by Deno Etienne [phone]) on 03/30/2022 7:31:04 AM  Radiology DG Chest 2 View  Result Date: 03/30/2022 CLINICAL DATA:  Chest pain. EXAM: CHEST - 2 VIEW COMPARISON:   None Available. FINDINGS: The heart size and mediastinal contours are within normal limits. Both lungs are clear. The visualized skeletal structures are unremarkable. IMPRESSION: No active cardiopulmonary disease. Electronically Signed   By: Marijo Conception M.D.   On: 03/30/2022 08:08    Procedures Procedures    Medications Ordered in ED Medications  acetaminophen (TYLENOL) tablet 650 mg (650 mg Oral Given 03/30/22 0732)    ED Course/ Medical  Decision Making/ A&P                           Medical Decision Making Amount and/or Complexity of Data Reviewed Labs: ordered. Radiology: ordered.  Risk OTC drugs. Prescription drug management.   26 yo F with cough congestion fevers chills myalgias nausea vomiting sore throat right adnexal pain.  She is well-appearing nontoxic.  Clear lung sounds for me.  Chest x-ray independently interpreted by me without focal infiltrate or pneumothorax.  Patient had labs performed in the triage process she has a negative troponin EKG is nonischemic no anemia no leukocytosis no significant electrolyte abnormality her COVID test is negative.  She was strep positive.  Exam is not totally consistent.  I suspect maybe she is colonized.  I did discuss risk and benefits of antibiotic therapy and she is electing for treatment.  She has a listed penicillin allergy but is tolerated amoxicillin without issue in the past.  8:29 AM:  I have discussed the diagnosis/risks/treatment options with the patient.  Evaluation and diagnostic testing in the emergency department does not suggest an emergent condition requiring admission or immediate intervention beyond what has been performed at this time.  They will follow up with PCP. We also discussed returning to the ED immediately if new or worsening sx occur. We discussed the sx which are most concerning (e.g., sudden worsening pain, fever, inability to tolerate by mouth) that necessitate immediate return. Medications administered  to the patient during their visit and any new prescriptions provided to the patient are listed below.  Medications given during this visit Medications  acetaminophen (TYLENOL) tablet 650 mg (650 mg Oral Given 03/30/22 0732)     The patient appears reasonably screen and/or stabilized for discharge and I doubt any other medical condition or other [facility] requiring further screening, evaluation, or treatment in the ED at this time prior to discharge.          Final Clinical Impression(s) / ED Diagnoses Final diagnoses:  Acute viral syndrome    Rx / DC Orders ED Discharge Orders          Ordered    benzonatate (TESSALON) 100 MG capsule  Every 8 hours        03/30/22 0826    ondansetron (ZOFRAN-ODT) 4 MG disintegrating tablet        03/30/22 0826    amoxicillin (AMOXIL) 500 MG capsule  2 times daily        03/30/22 0826              Deno Etienne, DO 03/30/22 [phone]

## 2022-03-31 ENCOUNTER — Ambulatory Visit: Payer: 59 | Admitting: Family Medicine

## 2022-04-01 NOTE — Patient Instructions (Signed)
Below is our plan:  We will continue Ajovy, Nurtec and gabapentin as prescribed.   Do not become pregnant while taking Ajovy or topiramate.   Please make sure you are staying well hydrated. I recommend 50-60 ounces daily. Well balanced diet and regular exercise encouraged. Consistent sleep schedule with 6-8 hours recommended.   Please continue follow up with care team as directed.   Follow up with me in 1 year   You may receive a survey regarding today's visit. I encourage you to leave honest feed back as I do use this information to improve patient care. Thank you for seeing me today!

## 2022-04-01 NOTE — Progress Notes (Signed)
   PATIENT: Edgar Corrigan DOB: 1995/06/28  REASON FOR VISIT: follow up HISTORY FROM: patient  Virtual Visit via Telephone Note  I connected with [name] on 04/06/22 at  8:45 AM EDT by telephone and verified that I am speaking with the correct person using two identifiers.   I discussed the limitations, risks, security and privacy concerns of performing an evaluation and management service by telephone and the availability of in person appointments. I also discussed with the patient that there may be a patient responsible charge related to this service. The patient expressed understanding and agreed to proceed.   History of Present Illness:  04/01/22 ALL:  Paisely returns for follow up for migraines and back pain with subjective lower ext weakness. We added Ajovy injections at last visit 09/2021. Nurtec continued for abortive therapy. She reports doing well. She may have 2-3 migraine days a month. Nurtec works well for abortive therapy. Gabapentin '300mg'$  TID PRN helps. She is doing well, today and without concerns.    Observations/Objective:  Generalized: Well developed, in no acute distress  Mentation: Alert oriented to time, place, history taking. Follows all commands speech and language fluent   Assessment and Plan:  26 y.o. year old female  has a past medical history of Anxiety, Asthma, Back pain, Depression, Headache, and Migraines. here with    ICD-10-CM   1. Migraine without aura and without status migrainosus, not intractable  G43.009       Austynn is doing well. We will continue Ajovy every 30 days and Nurtec as needed. She may continue gabapentin '300mg'$  up to three times daily for back pain. She will continue healthy lifestyle habits. She will follow up with me in 1 year, sooner if needed.   No orders of the defined types were placed in this encounter.   Meds ordered this encounter  Medications   Rimegepant Sulfate (NURTEC) 75 MG TBDP    Sig: Take 75  mg by mouth daily as needed.    Dispense:  8 tablet    Refill:  11    Order Specific Question:   Supervising Provider    Answer:   Melvenia Beam [[id number]]   Fremanezumab-vfrm (AJOVY) 225 MG/1.5ML SOAJ    Sig: Inject 225 mg into the skin every 30 (thirty) days.    Dispense:  4.5 mL    Refill:  3    Order Specific Question:   Supervising Provider    Answer:   Melvenia Beam [[id number]]     Follow Up Instructions:  I discussed the assessment and treatment plan with the patient. The patient was provided an opportunity to ask questions and all were answered. The patient agreed with the plan and demonstrated an understanding of the instructions.   The patient was advised to call back or seek an in-person evaluation if the symptoms worsen or if the condition fails to improve as anticipated.  I provided 15 minutes of non-face-to-face time during this encounter. Patient located at their place of residence during [place] visit. Provider is in the office.    Debbora Presto, NP

## 2022-04-02 ENCOUNTER — Other Ambulatory Visit: Payer: Self-pay

## 2022-04-02 ENCOUNTER — Emergency Department (HOSPITAL_BASED_OUTPATIENT_CLINIC_OR_DEPARTMENT_OTHER)
Admission: EM | Admit: 2022-04-02 | Discharge: 2022-04-02 | Disposition: A | Discharge status: Home / Self Care | Payer: 59 | Attending: Emergency Medicine | Admitting: Emergency Medicine

## 2022-04-02 ENCOUNTER — Encounter (HOSPITAL_BASED_OUTPATIENT_CLINIC_OR_DEPARTMENT_OTHER): Payer: Self-pay | Admitting: Emergency Medicine

## 2022-04-02 DIAGNOSIS — N61 Mastitis without abscess: Secondary | ICD-10-CM | POA: Diagnosis not present

## 2022-04-02 DIAGNOSIS — L03313 Cellulitis of chest wall: Secondary | ICD-10-CM

## 2022-04-02 DIAGNOSIS — N644 Mastodynia: Secondary | ICD-10-CM | POA: Diagnosis present

## 2022-04-02 MED ORDER — OXYCODONE-ACETAMINOPHEN 5-325 MG PO TABS: 1.0000 | ORAL_TABLET | Freq: Three times a day (TID) | ORAL | 0 refills | Status: DC | PRN

## 2022-04-02 MED ORDER — OXYCODONE-ACETAMINOPHEN 5-325 MG PO TABS
2.0000 | ORAL_TABLET | Freq: Once | ORAL | Status: AC
  Administered 2022-04-02: 2 via ORAL
  Filled 2022-04-02: qty 2

## 2022-04-02 MED ORDER — CLINDAMYCIN HCL 150 MG PO CAPS: 300.0000 mg | ORAL_CAPSULE | Freq: Four times a day (QID) | ORAL | 0 refills | Status: AC

## 2022-04-02 MED ORDER — CLINDAMYCIN HCL 150 MG PO CAPS
300.0000 mg | ORAL_CAPSULE | Freq: Once | ORAL | Status: AC
  Administered 2022-04-02: 300 mg via ORAL
  Filled 2022-04-02: qty 2

## 2022-04-02 NOTE — ED Triage Notes (Signed)
Pt states Sunday she was having pain under her right arm  Was seen here on Monday diagnosed with strep  Pt states the pain now runs along her right breast very tender to touch  Pt also c/o pain in her hips

## 2022-04-02 NOTE — ED Provider Notes (Signed)
[facility] EMERGENCY DEPARTMENT Provider Note   CSN: [id number] Arrival date & time: 04/02/22  0102     History  Chief Complaint  Patient presents with   Breast Pain    Vicki Harper is a 26 y.o. female.  26 yo F recently seen here and treated for strep. Stated she had some left upper chest/axillary discomfort at that time but it wasn't really fully addressed.  States over the last few days that pain and swelling is worse and spread down her breast to the left side of her thoracic cavity as well.  She states her breast is significantly swollen.  She states that she had a piercing in her right nipple but it was got painful and sore so she took that out a few days ago.  No fevers or chills.  Just pain and spreading pain.        Home Medications Prior to Admission medications   Medication Sig Start Date End Date Taking? Authorizing Provider  amoxicillin (AMOXIL) 500 MG capsule Take 2 capsules (1,000 mg total) by mouth 2 (two) times daily. 03/30/22   Deno Etienne, DO  ARIPiprazole (ABILIFY) 5 MG tablet Take 5 mg by mouth daily.    [provider]  benzonatate (TESSALON) 100 MG capsule Take 1 capsule (100 mg total) by mouth every 8 (eight) hours. 03/30/22   Deno Etienne, DO  EPINEPHrine 0.3 mg/0.3 mL IJ SOAJ injection SMARTSIG:IM As Directed PRN 11/05/21   [provider]  ferrous sulfate (FERROUSUL) 325 (65 FE) MG tablet Take 1 tablet (325 mg total) by mouth daily with breakfast. 04/15/20   Truett Mainland, DO  fluorometholone (FML) 0.1 % ophthalmic suspension SMARTSIG:In Eye(s) 09/18/21   [provider]  FLUoxetine (PROZAC) 20 MG tablet Take 30 mg by mouth daily. 09/11/21   [provider]  fluticasone-salmeterol (ADVAIR HFA) 230-21 MCG/ACT inhaler Inhale 2 puffs into the lungs 2 (two) times daily.    [provider]  Fremanezumab-vfrm (AJOVY) 225 MG/1.5ML SOAJ Inject 225 mg into the skin every 30 (thirty) days. 09/24/21   Lomax,  Amy, NP  gabapentin (NEURONTIN) 300 MG capsule Take 1 capsule (300 mg total) by mouth 3 (three) times daily. 08/11/21   Melvenia Beam, MD  ibuprofen (ADVIL) 800 MG tablet Take 1 tablet (800 mg total) by mouth every 8 (eight) hours as needed (pain). 11/06/21   Banister, Gwenlyn Perking, MD  LO LOESTRIN FE 1 MG-10 MCG / 10 MCG tablet Take by mouth. Patient not taking: Reported on 11/06/2021 09/11/21   [provider]  montelukast (SINGULAIR) 10 MG tablet Take 10 mg by mouth daily. 11/05/21   [provider]  ondansetron (ZOFRAN) 4 MG tablet Take 1 tablet (4 mg total) by mouth every 6 (six) hours. 03/28/22   Leanord Asal K, DO  ondansetron (ZOFRAN-ODT) 4 MG disintegrating tablet '4mg'$  ODT q4 hours prn nausea/vomit 03/30/22   Deno Etienne, DO  Rimegepant Sulfate (NURTEC) 75 MG TBDP Take 75 mg by mouth daily as needed. 03/24/21   Lomax, Amy, NP  tiZANidine (ZANAFLEX) 2 MG tablet Take 2-4 mg by mouth at bedtime. Patient not taking: Reported on 11/06/2021    [provider]  traZODone (DESYREL) 50 MG tablet Take 50-100 mg by mouth at bedtime as needed. 07/02/21   [provider]  Vitamin D, Ergocalciferol, (DRISDOL) 1.25 MG (50000 UNIT) CAPS capsule Take 50,000 Units by mouth once a week. 09/17/21   [provider]  Allergies    Penicillins    Review of Systems   Review of Systems  Physical Exam Updated Vital Signs BP 105/65 (BP Location: Left Arm)   Pulse (!) 103   Temp 99.1 F (37.3 C) (Oral)   Resp 14   Ht '5\' 6"'$  (1.676 m)   Wt 68 kg   SpO2 99%   BMI 24.21 kg/m  Physical Exam Vitals and nursing note reviewed.  Constitutional:      Appearance: She is well-developed.  HENT:     Head: Normocephalic and atraumatic.     Nose: No congestion or rhinorrhea.     Mouth/Throat:     Mouth: Mucous membranes are moist.     Pharynx: Oropharynx is clear.  Eyes:     Pupils: Pupils are equal, round, and reactive to light.  Cardiovascular:     Rate and Rhythm:  Normal rate and regular rhythm.  Pulmonary:     Effort: No respiratory distress.     Breath sounds: No stridor.  Abdominal:     General: Abdomen is flat. There is no distension.  Musculoskeletal:     Cervical back: Normal range of motion.  Skin:    General: Skin is warm and dry.     Findings: Erythema present.     Comments: Chaperoned by nurse tech, Jody, send has erythema extending from her terrier axillary line and down to the right breast and right nipple. Her whole right breast is more swollen than left. She has mild erythema to the medial side of her right breast. No palpable abscesses but she does have a little bit of purulence at the whole where her nipple ring was.  Neurological:     General: No focal deficit present.     Mental Status: She is alert.     ED Results / Procedures / Treatments   Labs (all labs ordered are listed, but only abnormal results are displayed) Labs Reviewed - No data to display  EKG None  Radiology No results found.  Procedures Procedures    Medications Ordered in ED Medications - No data to display  ED Course/ Medical Decision Making/ A&P                           Medical Decision Making Risk Prescription drug management.   Patient with what appears to be uncomplicated cellulitis.  Will treat with clindamycin.  No evidence of sepsis or abscess requiring imaging at this time.  I did discuss with her following up with her primary doctor to assess for resolution and possible need for ultrasound to rule out a deeper abscess especially if things were not improving.  Return here for new or worsening symptoms.  Pain meds and antibiotics provided for home.  Considered admission however has not really been on antibiotics yet and is not extensive and she is not septic.  Final Clinical Impression(s) / ED Diagnoses Final diagnoses:  None    Rx / DC Orders ED Discharge Orders     None         Maeci Kalbfleisch, Corene Cornea, MD 04/02/22 [phone]

## 2022-04-06 ENCOUNTER — Telehealth (INDEPENDENT_AMBULATORY_CARE_PROVIDER_SITE_OTHER): Payer: 59 | Admitting: Family Medicine

## 2022-04-06 ENCOUNTER — Encounter: Payer: Self-pay | Admitting: Family Medicine

## 2022-04-06 DIAGNOSIS — G43009 Migraine without aura, not intractable, without status migrainosus: Secondary | ICD-10-CM | POA: Diagnosis not present

## 2022-04-06 MED ORDER — AJOVY 225 MG/1.5ML ~~LOC~~ SOAJ: 225.0000 mg | SUBCUTANEOUS | 3 refills | Status: DC

## 2022-04-06 MED ORDER — NURTEC 75 MG PO TBDP: 75.0000 mg | ORAL_TABLET | Freq: Every day | ORAL | 11 refills | Status: DC | PRN

## 2022-05-20 ENCOUNTER — Other Ambulatory Visit: Payer: Self-pay

## 2022-05-20 ENCOUNTER — Encounter (HOSPITAL_BASED_OUTPATIENT_CLINIC_OR_DEPARTMENT_OTHER): Payer: Self-pay | Admitting: Emergency Medicine

## 2022-05-20 ENCOUNTER — Emergency Department (HOSPITAL_BASED_OUTPATIENT_CLINIC_OR_DEPARTMENT_OTHER)
Admission: EM | Admit: 2022-05-20 | Discharge: 2022-05-21 | Disposition: A | Discharge status: Home / Self Care | Payer: 59 | Attending: Emergency Medicine | Admitting: Emergency Medicine

## 2022-05-20 DIAGNOSIS — M791 Myalgia, unspecified site: Secondary | ICD-10-CM | POA: Insufficient documentation

## 2022-05-20 DIAGNOSIS — Z20822 Contact with and (suspected) exposure to covid-19: Secondary | ICD-10-CM | POA: Diagnosis not present

## 2022-05-20 DIAGNOSIS — J029 Acute pharyngitis, unspecified: Secondary | ICD-10-CM | POA: Insufficient documentation

## 2022-05-20 LAB — RESP PANEL BY RT-PCR (RSV, FLU A&B, COVID)  RVPGX2
Influenza A by PCR: NEGATIVE
Influenza B by PCR: NEGATIVE
Resp Syncytial Virus by PCR: NEGATIVE
SARS Coronavirus 2 by RT PCR: NEGATIVE

## 2022-05-20 LAB — GROUP A STREP BY PCR: Group A Strep by PCR: NOT DETECTED

## 2022-05-20 MED ORDER — IBUPROFEN 400 MG PO TABS
600.0000 mg | ORAL_TABLET | Freq: Once | ORAL | Status: AC
  Administered 2022-05-20: 600 mg via ORAL
  Filled 2022-05-20: qty 1

## 2022-05-20 MED ORDER — IBUPROFEN 600 MG PO TABS: 600.0000 mg | ORAL_TABLET | Freq: Four times a day (QID) | ORAL | 0 refills | Status: DC | PRN

## 2022-05-20 NOTE — ED Triage Notes (Signed)
Pt POV with known sick contact positive for flu A.  C/o sore throat and body aches since Monday. Denies known fever.

## 2022-05-20 NOTE — Discharge Instructions (Addendum)
It was a pleasure taking care of you today. As discussed, your COVID, flu, and strep test were negative. I suspect you have another viral infection causing your symptoms. Follow-up with PCP within the next few days if symptoms do not improve. Return to the ER for new or worsening symptoms.

## 2022-05-20 NOTE — ED Provider Notes (Signed)
[facility] Provider Note   CSN: [id number] Arrival date & time: 05/20/22  2014     History  Chief Complaint  Patient presents with   URI    Vicki Harper is a 26 y.o. female with a past medical history significant for depression and migraines who presents to the ED due to sore throat and bodyaches x 3 days.  Daughter sick with similar symptoms and recently tested positive for influenza A.  No chest pain or shortness of breath.  Denies abdominal pain, nausea, vomiting, or diarrhea.  No fever.  Admits to difficulty swallowing due to pain.  No change to phonation or trismus.  History obtained from patient and past medical records. No interpreter used during encounter.       Home Medications Prior to Admission medications   Medication Sig Start Date End Date Taking? Authorizing Provider  ibuprofen (ADVIL) 600 MG tablet Take 1 tablet (600 mg total) by mouth every 6 (six) hours as needed. 05/20/22  Yes Loreena Valeri, Druscilla Brownie, PA-C  amoxicillin (AMOXIL) 500 MG capsule Take 2 capsules (1,000 mg total) by mouth 2 (two) times daily. 03/30/22   Deno Etienne, DO  ARIPiprazole (ABILIFY) 5 MG tablet Take 5 mg by mouth daily.    [provider]  benzonatate (TESSALON) 100 MG capsule Take 1 capsule (100 mg total) by mouth every 8 (eight) hours. 03/30/22   Deno Etienne, DO  EPINEPHrine 0.3 mg/0.3 mL IJ SOAJ injection SMARTSIG:IM As Directed PRN 11/05/21   [provider]  ferrous sulfate (FERROUSUL) 325 (65 FE) MG tablet Take 1 tablet (325 mg total) by mouth daily with breakfast. 04/15/20   Truett Mainland, DO  fluorometholone (FML) 0.1 % ophthalmic suspension SMARTSIG:In Eye(s) 09/18/21   [provider]  FLUoxetine (PROZAC) 20 MG tablet Take 30 mg by mouth daily. 09/11/21   [provider]  fluticasone-salmeterol (ADVAIR HFA) 230-21 MCG/ACT inhaler Inhale 2 puffs into the lungs 2 (two) times daily.    [provider]   Fremanezumab-vfrm (AJOVY) 225 MG/1.5ML SOAJ Inject 225 mg into the skin every 30 (thirty) days. 04/06/22   Lomax, Amy, NP  gabapentin (NEURONTIN) 300 MG capsule Take 1 capsule (300 mg total) by mouth 3 (three) times daily. 08/11/21   Melvenia Beam, MD  ibuprofen (ADVIL) 800 MG tablet Take 1 tablet (800 mg total) by mouth every 8 (eight) hours as needed (pain). 11/06/21   Banister, Gwenlyn Perking, MD  LO LOESTRIN FE 1 MG-10 MCG / 10 MCG tablet Take by mouth. Patient not taking: Reported on 11/06/2021 09/11/21   [provider]  montelukast (SINGULAIR) 10 MG tablet Take 10 mg by mouth daily. 11/05/21   [provider]  ondansetron (ZOFRAN) 4 MG tablet Take 1 tablet (4 mg total) by mouth every 6 (six) hours. 03/28/22   Leanord Asal K, DO  ondansetron (ZOFRAN-ODT) 4 MG disintegrating tablet '4mg'$  ODT q4 hours prn nausea/vomit 03/30/22   Deno Etienne, DO  oxyCODONE-acetaminophen (PERCOCET) 5-325 MG tablet Take 1-2 tablets by mouth every 8 (eight) hours as needed. 04/02/22   Mesner, Corene Cornea, MD  Rimegepant Sulfate (NURTEC) 75 MG TBDP Take 75 mg by mouth daily as needed. 04/06/22   Lomax, Amy, NP  tiZANidine (ZANAFLEX) 2 MG tablet Take 2-4 mg by mouth at bedtime. Patient not taking: Reported on 11/06/2021    [provider]  traZODone (DESYREL) 50 MG tablet Take 50-100 mg by mouth at bedtime as needed. 07/02/21   [provider]  Vitamin D, Ergocalciferol, (DRISDOL) 1.25 MG (50000 UNIT) CAPS capsule Take 50,000 Units by mouth once a week. 09/17/21   [provider]      Allergies    Penicillins    Review of Systems   Review of Systems  Constitutional:  Negative for chills and fever.  HENT:  Positive for sore throat. Negative for voice change.   Respiratory:  Positive for cough. Negative for shortness of breath.   Cardiovascular:  Negative for chest pain.  Gastrointestinal:  Negative for abdominal pain.  All other systems reviewed and are negative.   Physical  Exam Updated Vital Signs BP 108/83 (BP Location: Left Arm)   Pulse 90   Temp 98.7 F (37.1 C) (Oral)   Resp 18   SpO2 100%  Physical Exam Vitals and nursing note reviewed.  Constitutional:      General: She is not in acute distress.    Appearance: She is not ill-appearing.  HENT:     Head: Normocephalic.     Mouth/Throat:     Comments: Posterior oropharynx clear and mucous membranes moist, there is mild erythema but no edema or tonsillar exudates, uvula midline, normal phonation, no trismus, tolerating secretions without difficulty. Eyes:     Pupils: Pupils are equal, round, and reactive to light.  Cardiovascular:     Rate and Rhythm: Normal rate and regular rhythm.     Pulses: Normal pulses.     Heart sounds: Normal heart sounds. No murmur heard.    No friction rub. No gallop.  Pulmonary:     Effort: Pulmonary effort is normal.     Breath sounds: Normal breath sounds.  Abdominal:     General: Abdomen is flat. There is no distension.     Palpations: Abdomen is soft.     Tenderness: There is no abdominal tenderness. There is no guarding or rebound.  Musculoskeletal:        General: Normal range of motion.     Cervical back: Neck supple.  Skin:    General: Skin is warm and dry.  Neurological:     General: No focal deficit present.     Mental Status: She is alert.  Psychiatric:        Mood and Affect: Mood normal.        Behavior: Behavior normal.     ED Results / Procedures / Treatments   Labs (all labs ordered are listed, but only abnormal results are displayed) Labs Reviewed  RESP PANEL BY RT-PCR (RSV, FLU A&B, COVID)  [id number]  GROUP A STREP BY PCR    EKG None  Radiology No results found.  Procedures Procedures    Medications Ordered in ED Medications  ibuprofen (ADVIL) tablet 600 mg (has no administration in time range)    ED Course/ Medical Decision Making/ A&P                           Medical Decision Making  26 year old female presents to  the ED due to sore throat and bodyaches x 3 days.  Patient's daughter recently tested positive for influenza A.  Upon arrival, vitals all within normal limits.  Patient in no acute distress.  Mild erythema to throat without tonsillar hypertrophy.  No abscess.  No change in phonation.  No meningismus to suggest meningitis.  Lungs clear to auscultation bilaterally.  Low suspicion for pneumonia.  COVID, influenza, RSV, and strep negative.  Suspect viral etiology of sore throat.  Patient treated with ibuprofen here in the ED.  Patient declined any concerns about pregnancy.  Advised patient follow-up with PCP if symptoms not improve over the next few days. Strict ED precautions discussed with patient. Patient states understanding and agrees to plan. Patient discharged home in no acute distress and stable vitals       Final Clinical Impression(s) / ED Diagnoses Final diagnoses:  Sore throat    Rx / DC Orders ED Discharge Orders          Ordered    ibuprofen (ADVIL) 600 MG tablet  Every 6 hours PRN        05/20/22 2350              Karie Kirks 05/20/22 2351    Ripley Fraise, MD 05/21/22 [phone]

## 2022-07-28 ENCOUNTER — Telehealth: Payer: Self-pay | Admitting: *Deleted

## 2022-07-28 ENCOUNTER — Encounter: Payer: Self-pay | Admitting: *Deleted

## 2022-07-28 NOTE — Telephone Encounter (Addendum)
   Pt spoke with [facility] and was told her Ajovy need PA. Insurance card attached to proceed with PA.

## 2022-07-29 ENCOUNTER — Other Ambulatory Visit (HOSPITAL_COMMUNITY): Payer: Self-pay

## 2022-07-29 NOTE — Telephone Encounter (Signed)
Patient Advocate Encounter   Received notification that prior authorization for AJOVY (fremanezumab-vfrm) injection 225MG/1.5ML auto-injectors is required.   PA submitted on 07/29/2022 Key [id number] Status is pending       Lyndel Safe, [facility] Patient Advocate Specialist [facility] Patient Advocate Team Direct Number: [phone]  Fax: [phone]

## 2022-08-03 NOTE — Telephone Encounter (Signed)
Any update for the below?

## 2022-08-04 NOTE — Telephone Encounter (Signed)
Still pending

## 2022-08-06 NOTE — Telephone Encounter (Signed)
Patient sent a my chart message stating she received a denial email from [place]. Anyway to look into this?

## 2022-08-11 ENCOUNTER — Other Ambulatory Visit (HOSPITAL_COMMUNITY): Payer: Self-pay

## 2022-08-11 NOTE — Telephone Encounter (Addendum)
Pharmacy Patient Advocate Encounter   Received notification from [place] that prior authorization for Ajovy is required/requested.  PA submitted on 08/11/2022 to (ins) [employer] via CoverMyMeds Key or (Medicaid) confirmation # [id number] Status is pending

## 2022-08-12 MED ORDER — EMGALITY 120 MG/ML ~~LOC~~ SOAJ: 120.0000 mg | SUBCUTANEOUS | 3 refills | Status: DC

## 2022-08-12 NOTE — Telephone Encounter (Addendum)
Vicki Harper per pt insurance "Vicki Harper was denied by your insurance. It is actually a plan exclusion for your insurance. The covered options are either Aimovig or Emgality.   The patient is aware of this, she is fine with either medication you approve. Rx can be sent to [facility] listed in her chart.  Please advise

## 2022-08-12 NOTE — Addendum Note (Signed)
Addended by: Debbora Presto L on: 08/12/2022 08:10 AM   Modules accepted: Orders

## 2022-08-13 NOTE — Telephone Encounter (Addendum)
PA Emgality submitted on covermymeds. [id number]. Waiting on determination from [employer].  PA approved: Request Reference Number: [id number]. EMGALITY INJ '120MG'$ /ML is approved through 11/13/2022. For further questions, call [employer] at [phone].Marland Kitchen Authorization Expiration Date: November 13, 2022.

## 2022-09-18 ENCOUNTER — Emergency Department (HOSPITAL_BASED_OUTPATIENT_CLINIC_OR_DEPARTMENT_OTHER): Payer: 59

## 2022-09-18 ENCOUNTER — Encounter (HOSPITAL_BASED_OUTPATIENT_CLINIC_OR_DEPARTMENT_OTHER): Payer: Self-pay | Admitting: Urology

## 2022-09-18 DIAGNOSIS — G43409 Hemiplegic migraine, not intractable, without status migrainosus: Secondary | ICD-10-CM | POA: Diagnosis not present

## 2022-09-18 DIAGNOSIS — B349 Viral infection, unspecified: Secondary | ICD-10-CM | POA: Diagnosis not present

## 2022-09-18 DIAGNOSIS — J45909 Unspecified asthma, uncomplicated: Secondary | ICD-10-CM | POA: Diagnosis not present

## 2022-09-18 DIAGNOSIS — Z1152 Encounter for screening for COVID-19: Secondary | ICD-10-CM | POA: Insufficient documentation

## 2022-09-18 DIAGNOSIS — G43909 Migraine, unspecified, not intractable, without status migrainosus: Secondary | ICD-10-CM | POA: Diagnosis present

## 2022-09-18 LAB — RESP PANEL BY RT-PCR (RSV, FLU A&B, COVID)  RVPGX2
Influenza A by PCR: NEGATIVE
Influenza B by PCR: NEGATIVE
Resp Syncytial Virus by PCR: NEGATIVE
SARS Coronavirus 2 by RT PCR: NEGATIVE

## 2022-09-18 NOTE — ED Triage Notes (Signed)
Pt states started having headache on Thursday am at 0300 after drinking etoh  States migraine continued since then, states sinus congestion and shortness of breath that started today States body aches, weakness, and fatigue that started today as well   H/o migraines

## 2022-09-19 ENCOUNTER — Emergency Department (HOSPITAL_BASED_OUTPATIENT_CLINIC_OR_DEPARTMENT_OTHER)
Admission: EM | Admit: 2022-09-19 | Discharge: 2022-09-19 | Disposition: A | Discharge status: Home / Self Care | Payer: 59 | Attending: Emergency Medicine | Admitting: Emergency Medicine

## 2022-09-19 ENCOUNTER — Other Ambulatory Visit: Payer: Self-pay

## 2022-09-19 DIAGNOSIS — G43409 Hemiplegic migraine, not intractable, without status migrainosus: Secondary | ICD-10-CM

## 2022-09-19 DIAGNOSIS — J988 Other specified respiratory disorders: Secondary | ICD-10-CM

## 2022-09-19 LAB — HCG, SERUM, QUALITATIVE: Preg, Serum: NEGATIVE

## 2022-09-19 MED ORDER — KETOROLAC TROMETHAMINE 15 MG/ML IJ SOLN
15.0000 mg | Freq: Once | INTRAMUSCULAR | Status: AC | PRN
  Administered 2022-09-19: 15 mg via INTRAVENOUS
  Filled 2022-09-19: qty 1

## 2022-09-19 MED ORDER — METOCLOPRAMIDE HCL 5 MG/ML IJ SOLN
10.0000 mg | Freq: Once | INTRAMUSCULAR | Status: AC
  Administered 2022-09-19: 10 mg via INTRAVENOUS
  Filled 2022-09-19: qty 2

## 2022-09-19 MED ORDER — OXYMETAZOLINE HCL 0.05 % NA SOLN: 2.0000 | Freq: Two times a day (BID) | NASAL | Status: DC | PRN

## 2022-09-19 MED ORDER — DIPHENHYDRAMINE HCL 50 MG/ML IJ SOLN
25.0000 mg | Freq: Once | INTRAMUSCULAR | Status: AC
  Administered 2022-09-19: 25 mg via INTRAVENOUS
  Filled 2022-09-19: qty 1

## 2022-09-19 MED ORDER — SODIUM CHLORIDE 0.9 % IV BOLUS
1000.0000 mL | Freq: Once | INTRAVENOUS | Status: AC
  Administered 2022-09-19: 1000 mL via INTRAVENOUS

## 2022-09-19 NOTE — ED Notes (Signed)
Pt complaint of SHOB, headache, congestion, and nausea since yesterday. No signs of airway obstruction

## 2022-09-19 NOTE — ED Provider Notes (Signed)
[facility] Provider Note: Lowella Dell, MD, FACEP  CSN: [id number] MRN: [id number] ARRIVAL: 09/18/22 at 2017 ROOM: MH06/[facility]06   CHIEF COMPLAINT  Migraine   HISTORY OF PRESENT ILLNESS  09/19/22 12:50 AM Vicki Harper is a 27 y.o. female with a history of migraines.  She overindulged in alcohol 2 mornings ago and has had a headache since.  The headache is in her right temple which is a different location than her typical migraines which are frontal but the pain is similar.  She rates it as an 8 out of 10.  She has no associated photophobia but is nauseated.  She has also had 2 days of flulike symptoms.  Specifically she has had bodyaches, fatigue, nasal congestion and cough.  She is not having difficulty breathing.   Past Medical History:  Diagnosis Date   Anxiety    Asthma    Back pain    Depression    Headache    Migraines     Past Surgical History:  Procedure Laterality Date   NO PAST SURGERIES      Family History  Problem Relation Age of Onset   Migraines Mother    Asthma Brother    Neuromuscular disorder Neg Hx    Autoimmune disease Neg Hx     Social History   Tobacco Use   Smoking status: Never   Smokeless tobacco: Never  Vaping Use   Vaping Use: Never used  Substance Use Topics   Alcohol use: No    Alcohol/week: 0.0 standard drinks of alcohol   Drug use: No    Prior to Admission medications   Medication Sig Start Date End Date Taking? Authorizing Provider  amoxicillin (AMOXIL) 500 MG capsule Take 2 capsules (1,000 mg total) by mouth 2 (two) times daily. 03/30/22   Melene Plan, DO  ARIPiprazole (ABILIFY) 5 MG tablet Take 5 mg by mouth daily.    [provider]  benzonatate (TESSALON) 100 MG capsule Take 1 capsule (100 mg total) by mouth every 8 (eight) hours. 03/30/22   Melene Plan, DO  EPINEPHrine 0.3 mg/0.3 mL IJ SOAJ injection SMARTSIG:IM As Directed PRN 11/05/21   [provider]  ferrous sulfate (FERROUSUL) 325  (65 FE) MG tablet Take 1 tablet (325 mg total) by mouth daily with breakfast. 04/15/20   Levie Heritage, DO  fluorometholone (FML) 0.1 % ophthalmic suspension SMARTSIG:In Eye(s) 09/18/21   [provider]  FLUoxetine (PROZAC) 20 MG tablet Take 30 mg by mouth daily. 09/11/21   [provider]  fluticasone-salmeterol (ADVAIR HFA) 230-21 MCG/ACT inhaler Inhale 2 puffs into the lungs 2 (two) times daily.    [provider]  gabapentin (NEURONTIN) 300 MG capsule Take 1 capsule (300 mg total) by mouth 3 (three) times daily. 08/11/21   Anson Fret, MD  Galcanezumab-gnlm (EMGALITY) 120 MG/ML SOAJ Inject 120 mg into the skin every 30 (thirty) days. 08/12/22   Lomax, Amy, NP  ibuprofen (ADVIL) 600 MG tablet Take 1 tablet (600 mg total) by mouth every 6 (six) hours as needed. 05/20/22   Mannie Stabile, PA-C  ibuprofen (ADVIL) 800 MG tablet Take 1 tablet (800 mg total) by mouth every 8 (eight) hours as needed (pain). 11/06/21   Banister, Janace Aris, MD  LO LOESTRIN FE 1 MG-10 MCG / 10 MCG tablet Take by mouth. Patient not taking: Reported on 11/06/2021 09/11/21   [provider]  montelukast (SINGULAIR) 10 MG tablet Take 10 mg by mouth daily. 11/05/21  [provider]  ondansetron (ZOFRAN) 4 MG tablet Take 1 tablet (4 mg total) by mouth every 6 (six) hours. 03/28/22   Elayne Snare K, DO  ondansetron (ZOFRAN-ODT) 4 MG disintegrating tablet  ODT q4 hours prn nausea/vomit 03/30/22   Melene Plan, DO  oxyCODONE-acetaminophen (PERCOCET) 5-325 MG tablet Take 1-2 tablets by mouth every 8 (eight) hours as needed. 04/02/22   Mesner, Barbara Cower, MD  Rimegepant Sulfate (NURTEC) 75 MG TBDP Take 75 mg by mouth daily as needed. 04/06/22   Lomax, Amy, NP  tiZANidine (ZANAFLEX) 2 MG tablet Take 2-4 mg by mouth at bedtime. Patient not taking: Reported on 11/06/2021    [provider]  traZODone (DESYREL) 50 MG tablet Take 50-100 mg by mouth at bedtime as needed. 07/02/21    [provider]  Vitamin D, Ergocalciferol, (DRISDOL) 1.25 MG (50000 UNIT) CAPS capsule Take 50,000 Units by mouth once a week. 09/17/21   [provider]    Allergies Penicillins   REVIEW OF SYSTEMS  Negative except as noted here or in the History of Present Illness.   PHYSICAL EXAMINATION  Initial Vital Signs Blood pressure 115/80, pulse 86, temperature 98.7 F (37.1 C), temperature source Oral, resp. rate 16, height  (1.676 m), weight 76.8 kg, SpO2 100 %.  Examination General: Well-developed, well-nourished female in no acute distress; appearance consistent with age of record HENT: normocephalic; atraumatic Eyes: pupils equal, round and reactive to light; extraocular muscles intact; no photophobia Neck: supple Heart: regular rate and rhythm Lungs: clear to auscultation bilaterally Abdomen: soft; nondistended; nontender; bowel sounds present Extremities: No deformity; full range of motion; pulses normal Neurologic: Awake, alert and oriented; motor function intact in all extremities and symmetric; no facial droop Skin: Warm and dry Psychiatric: Normal mood and affect   RESULTS  Summary of this visit's results, reviewed and interpreted by myself:   EKG Interpretation  Date/Time:    Ventricular Rate:    PR Interval:    QRS Duration:   QT Interval:    QTC Calculation:   R Axis:     Text Interpretation:         Laboratory Studies: Results for orders placed or performed during the hospital encounter of 09/19/22 (from the past 24 hour(s))  Resp panel by RT-PCR (RSV, Flu A&B, Covid) Anterior Nasal Swab     Status: None   Collection Time: 09/18/22  8:26 PM   Specimen: Anterior Nasal Swab  Result Value Ref Range   SARS Coronavirus 2 by RT PCR NEGATIVE NEGATIVE   Influenza A by PCR NEGATIVE NEGATIVE   Influenza B by PCR NEGATIVE NEGATIVE   Resp Syncytial Virus by PCR NEGATIVE NEGATIVE  hCG, serum, qualitative     Status: None   Collection Time:  09/19/22  1:18 AM  Result Value Ref Range   Preg, Serum NEGATIVE NEGATIVE   Imaging Studies: DG Chest 2 View  Result Date: 09/18/2022 CLINICAL DATA:  Shortness of breath EXAM: CHEST - 2 VIEW COMPARISON:  03/30/2022 FINDINGS: The heart size and mediastinal contours are within normal limits. Both lungs are clear. The visualized skeletal structures are unremarkable. IMPRESSION: No active cardiopulmonary disease. Electronically Signed   By: Ernie Avena M.D.   On: 09/18/2022 20:41    ED COURSE and MDM  Nursing notes, initial and subsequent vitals signs, including pulse oximetry, reviewed and interpreted by myself.  Vitals:   09/19/22 0100 09/19/22 0130 09/19/22 0145 09/19/22 0215  BP: (!) 105/57   98/61  Pulse: 78  65 73 67  Resp:      Temp:      TempSrc:      SpO2: 100% 100% 97% (!) 89%  Weight:      Height:       Medications  oxymetazoline (AFRIN) 0.05 % nasal spray 2 spray (has no administration in time range)  sodium chloride 0.9 % bolus 1,000 mL (0 mLs Intravenous Stopped 09/19/22 0232)  diphenhydrAMINE (BENADRYL) injection 25 mg (25 mg Intravenous Given 09/19/22 0129)  metoCLOPramide (REGLAN) injection 10 mg (10 mg Intravenous Given 09/19/22 0129)  ketorolac (TORADOL) 15 MG/ML injection 15 mg (15 mg Intravenous Given 09/19/22 0159)   2:41 AM Headache relieved with IV medications.  Patient ready to go home.  Patient negative for tested viruses but her flulike symptoms are likely viral in nature.  She was given Afrin for use limited to 3 days to relieve nasal congestion.   PROCEDURES  Procedures   ED DIAGNOSES     ICD-10-CM   1. Sporadic migraine  G43.409     2. Viral respiratory illness  J98.8    B97.89          Dontell Mian, Jonny Ruiz, MD 09/19/22 [phone]

## 2022-09-21 ENCOUNTER — Other Ambulatory Visit: Payer: Self-pay

## 2022-09-21 DIAGNOSIS — J45909 Unspecified asthma, uncomplicated: Secondary | ICD-10-CM | POA: Diagnosis not present

## 2022-09-21 DIAGNOSIS — R519 Headache, unspecified: Secondary | ICD-10-CM | POA: Insufficient documentation

## 2022-09-21 NOTE — ED Triage Notes (Signed)
Pt has had HA since Wednesday - was seen here on Friday and given cocktail; and it went away but now back and worse than ever.  Pt only took 500 mg Tylenol at home today @ 1930.

## 2022-09-22 ENCOUNTER — Encounter (HOSPITAL_BASED_OUTPATIENT_CLINIC_OR_DEPARTMENT_OTHER): Payer: Self-pay

## 2022-09-22 ENCOUNTER — Emergency Department (HOSPITAL_BASED_OUTPATIENT_CLINIC_OR_DEPARTMENT_OTHER)
Admission: EM | Admit: 2022-09-22 | Discharge: 2022-09-22 | Disposition: A | Discharge status: Home / Self Care | Payer: 59 | Attending: Emergency Medicine | Admitting: Emergency Medicine

## 2022-09-22 DIAGNOSIS — R519 Headache, unspecified: Secondary | ICD-10-CM

## 2022-09-22 MED ORDER — DEXAMETHASONE SODIUM PHOSPHATE 10 MG/ML IJ SOLN
10.0000 mg | Freq: Once | INTRAMUSCULAR | Status: AC
  Administered 2022-09-22: 10 mg via INTRAVENOUS
  Filled 2022-09-22: qty 1

## 2022-09-22 MED ORDER — DIPHENHYDRAMINE HCL 50 MG/ML IJ SOLN
25.0000 mg | Freq: Once | INTRAMUSCULAR | Status: AC
  Administered 2022-09-22: 25 mg via INTRAVENOUS
  Filled 2022-09-22: qty 1

## 2022-09-22 MED ORDER — METOCLOPRAMIDE HCL 5 MG/ML IJ SOLN
10.0000 mg | Freq: Once | INTRAMUSCULAR | Status: AC
  Administered 2022-09-22: 10 mg via INTRAVENOUS
  Filled 2022-09-22: qty 2

## 2022-09-22 MED ORDER — KETOROLAC TROMETHAMINE 15 MG/ML IJ SOLN
15.0000 mg | Freq: Once | INTRAMUSCULAR | Status: AC
  Administered 2022-09-22: 15 mg via INTRAVENOUS
  Filled 2022-09-22: qty 1

## 2022-09-22 NOTE — ED Notes (Signed)
Pt reports having a HA x several days is on Migraines meds and they are not helping

## 2022-09-22 NOTE — ED Provider Notes (Signed)
EMERGENCY DEPARTMENT AT [facility] Provider Note   CSN: [id number] Arrival date & time: 09/21/22  2121     History  Chief Complaint  Patient presents with   Headache    Vicki Harper is a 27 y.o. female.  The history is provided by the patient and medical records.  Headache Vicki Harper is a 27 y.o. female who presents to the Emergency Department complaining of headache.  She presents to the emergency department complaining of frontal headache that started on Wednesday.  She was evaluated in the ER on Friday and was treated with a headache cocktail that resolved her headache and then on Sunday her headache returned.  Pain is constant.  No fever, vomiting but does have nausea.  No numbness, weakness, vision changes.  She does have photophobia.  She has a history of asthma as well as history of headache.  This is different from her typical headaches because it is the first time the headache has returned after treatment.  She is scheduled to see neurology a week and a half from now.       Home Medications Prior to Admission medications   Medication Sig Start Date End Date Taking? Authorizing Provider  amoxicillin (AMOXIL) 500 MG capsule Take 2 capsules (1,000 mg total) by mouth 2 (two) times daily. 03/30/22   Melene Plan, DO  ARIPiprazole (ABILIFY) 5 MG tablet Take 5 mg by mouth daily.    [provider]  benzonatate (TESSALON) 100 MG capsule Take 1 capsule (100 mg total) by mouth every 8 (eight) hours. 03/30/22   Melene Plan, DO  EPINEPHrine 0.3 mg/0.3 mL IJ SOAJ injection SMARTSIG:IM As Directed PRN 11/05/21   [provider]  ferrous sulfate (FERROUSUL) 325 (65 FE) MG tablet Take 1 tablet (325 mg total) by mouth daily with breakfast. 04/15/20   Levie Heritage, DO  fluorometholone (FML) 0.1 % ophthalmic suspension SMARTSIG:In Eye(s) 09/18/21   [provider]  FLUoxetine (PROZAC) 20 MG tablet Take 30 mg by mouth daily.  09/11/21   [provider]  fluticasone-salmeterol (ADVAIR HFA) 230-21 MCG/ACT inhaler Inhale 2 puffs into the lungs 2 (two) times daily.    [provider]  gabapentin (NEURONTIN) 300 MG capsule Take 1 capsule (300 mg total) by mouth 3 (three) times daily. 08/11/21   Anson Fret, MD  Galcanezumab-gnlm (EMGALITY) 120 MG/ML SOAJ Inject 120 mg into the skin every 30 (thirty) days. 08/12/22   Lomax, Amy, NP  ibuprofen (ADVIL) 600 MG tablet Take 1 tablet (600 mg total) by mouth every 6 (six) hours as needed. 05/20/22   Mannie Stabile, PA-C  ibuprofen (ADVIL) 800 MG tablet Take 1 tablet (800 mg total) by mouth every 8 (eight) hours as needed (pain). 11/06/21   Banister, Janace Aris, MD  LO LOESTRIN FE 1 MG-10 MCG / 10 MCG tablet Take by mouth. Patient not taking: Reported on 11/06/2021 09/11/21   [provider]  montelukast (SINGULAIR) 10 MG tablet Take 10 mg by mouth daily. 11/05/21   [provider]  ondansetron (ZOFRAN) 4 MG tablet Take 1 tablet (4 mg total) by mouth every 6 (six) hours. 03/28/22   Elayne Snare K, DO  ondansetron (ZOFRAN-ODT) 4 MG disintegrating tablet 4mg  ODT q4 hours prn nausea/vomit 03/30/22   Melene Plan, DO  oxyCODONE-acetaminophen (PERCOCET) 5-325 MG tablet Take 1-2 tablets by mouth every 8 (eight) hours as needed. 04/02/22   Mesner, Barbara Cower, MD  Rimegepant Sulfate (NURTEC) 75 MG TBDP  Take 75 mg by mouth daily as needed. 04/06/22   Lomax, Amy, NP  tiZANidine (ZANAFLEX) 2 MG tablet Take 2-4 mg by mouth at bedtime. Patient not taking: Reported on 11/06/2021    [provider]  traZODone (DESYREL) 50 MG tablet Take 50-100 mg by mouth at bedtime as needed. 07/02/21   [provider]  Vitamin D, Ergocalciferol, (DRISDOL) 1.25 MG (50000 UNIT) CAPS capsule Take 50,000 Units by mouth once a week. 09/17/21   [provider]      Allergies    Penicillins    Review of Systems   Review of Systems  Neurological:  Positive for  headaches.  All other systems reviewed and are negative.   Physical Exam Updated Vital Signs BP 108/67   Pulse 73   Temp 98.1 F (36.7 C)   Resp 18   Ht  (1.676 m)   Wt 76.8 kg   LMP  (LMP Unknown) Comment: irregular cycles  SpO2 100%   BMI 27.34 kg/m  Physical Exam Vitals and nursing note reviewed.  Constitutional:      Appearance: She is well-developed.  HENT:     Head: Normocephalic and atraumatic.     Mouth/Throat:     Mouth: Mucous membranes are moist.  Eyes:     Extraocular Movements: Extraocular movements intact.     Pupils: Pupils are equal, round, and reactive to light.  Cardiovascular:     Rate and Rhythm: Normal rate and regular rhythm.     Heart sounds: No murmur heard. Pulmonary:     Effort: Pulmonary effort is normal. No respiratory distress.     Breath sounds: Normal breath sounds.  Abdominal:     Palpations: Abdomen is soft.     Tenderness: There is no abdominal tenderness. There is no guarding or rebound.  Musculoskeletal:        General: No tenderness.     Cervical back: Neck supple.  Skin:    General: Skin is warm and dry.  Neurological:     Mental Status: She is alert and oriented to person, place, and time.     Comments: No asymmetry of facial movement, visual fields grossly intact.  5/5 strength in all four extremities.    Psychiatric:        Behavior: Behavior normal.     ED Results / Procedures / Treatments   Labs (all labs ordered are listed, but only abnormal results are displayed) Labs Reviewed - No data to display  EKG None  Radiology No results found.  Procedures Procedures    Medications Ordered in ED Medications  ketorolac (TORADOL) 15 MG/ML injection 15 mg (15 mg Intravenous Given 09/22/22 0254)  metoCLOPramide (REGLAN) injection 10 mg (10 mg Intravenous Given 09/22/22 0254)  diphenhydrAMINE (BENADRYL) injection 25 mg (25 mg Intravenous Given 09/22/22 0247)  dexamethasone (DECADRON) injection 10 mg (10 mg  Intravenous Given 09/22/22 0253)    ED Course/ Medical Decision Making/ A&P                             Medical Decision Making Risk Prescription drug management.   Patient with history of headaches here for evaluation of recurrent headache, no focal neurologic deficits on examination.  She is nontoxic-appearing.  Current clinical picture is not consistent with space-occupying lesion, subarachnoid hemorrhage, meningitis, dural sinus thrombosis.  She was treated with medications with improvement in her symptoms in the department.  Plan to discharge home with  outpatient neuro follow-up and return precautions.  She was treated with dexamethasone to help limit rebound headache.        Final Clinical Impression(s) / ED Diagnoses Final diagnoses:  Bad headache    Rx / DC Orders ED Discharge Orders     None         Tilden Fossa, MD 09/22/22 [phone]
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
## 2022-09-30 NOTE — Patient Instructions (Incomplete)
Below is our plan:  We will start Emgality. (Take Ajovy sample, today). When you get Emgality go ahead and take another injection. We will restart gabapentin  at bedtime. We will switch Nurtec to [place] for abortive therapy. Please take 1 tablet at onset of headache. May take 1 additional tablet in 2 hours if needed. Do not take more than 2 tablets in 24 hours or more than 10 in a month.   Please make sure you are staying well hydrated. I recommend 50-60 ounces daily. Well balanced diet and regular exercise encouraged. Consistent sleep schedule with 6-8 hours recommended.   Please continue follow up with care team as directed.   Follow up with me in 6 months   You may receive a survey regarding today's visit. I encourage you to leave honest feed back as I do use this information to improve patient care. Thank you for seeing me today!   GENERAL HEADACHE INFORMATION:   Natural supplements: Magnesium Oxide or Magnesium Glycinate 500 mg at bed (up to 800 mg daily) Coenzyme Q10 300 mg in AM Vitamin B2- 200 mg twice a day   Add 1 supplement at a time since even natural supplements can have undesirable side effects. You can sometimes buy supplements cheaper (especially Coenzyme Q10) at www.WebmailGuide.co.za or at [employer].   Vitamins and herbs that show potential:   Magnesium: Magnesium (250 mg twice a day or 500 mg at bed) has a relaxant effect on smooth muscles such as blood vessels. Individuals suffering from frequent or daily headache usually have low magnesium levels which can be increase with daily supplementation of 400-750 mg. Three trials found 40-90% average headache reduction  when used as a preventative. Magnesium also demonstrated the benefit in menstrually related migraine.  Magnesium is part of the messenger system in the serotonin cascade and it is a good muscle relaxant.  It is also useful for constipation which can be a side effect of other medications used to treat migraine. Good  sources include nuts, whole grains, and tomatoes. Side Effects: loose stool/diarrhea  Riboflavin (vitamin B 2) 200 mg twice a day. This vitamin assists nerve cells in the production of ATP a principal energy storing molecule.  It is necessary for many chemical reactions in the body.  There have been at least 3 clinical trials of riboflavin using 400 mg per day all of which suggested that migraine frequency can be decreased.  All 3 trials showed significant improvement in over half of migraine sufferers.  The supplement is found in bread, cereal, milk, meat, and poultry.  Most Americans get more riboflavin than the recommended daily allowance, however riboflavin deficiency is not necessary for the supplements to help prevent headache. Side effects: energizing, green urine   Coenzyme Q10: This is present in almost all cells in the body and is critical component for the conversion of energy.  Recent studies have shown that a nutritional supplement of CoQ10 can reduce the frequency of migraine attacks by improving the energy production of cells as with riboflavin.  Doses of 150 mg twice a day have been shown to be effective.   Melatonin: Increasing evidence shows correlation between melatonin secretion and headache conditions.  Melatonin supplementation has decreased headache intensity and duration.  It is widely used as a sleep aid.  Sleep is natures way of dealing with migraine.  A dose of 3 mg is recommended to start for headaches including cluster headache. Higher doses up to 15 mg has been reviewed for use  in Cluster headache and have been used. The rationale behind using melatonin for cluster is that many theories regarding the cause of Cluster headache center around the disruption of the normal circadian rhythm in the brain.  This helps restore the normal circadian rhythm.   HEADACHE DIET: Foods and beverages which may trigger migraine Note that only 20% of headache patients are food sensitive. You will  know if you are food sensitive if you get a headache consistently 20 minutes to 2 hours after eating a certain food. Only cut out a food if it causes headaches, otherwise you might remove foods you enjoy! What matters most for diet is to eat a well balanced healthy diet full of vegetables and low fat protein, and to not miss meals.   Chocolate, other sweets ALL cheeses except cottage and cream cheese Dairy products, yogurt, sour cream, ice cream Liver Meat extracts (Bovril, Marmite, meat tenderizers) Meats or fish which have undergone aging, fermenting, pickling or smoking. These include: Hotdogs,salami,Lox,sausage, mortadellas,smoked salmon, pepperoni, Pickled herring Pods of broad bean (English beans, Chinese pea pods, [place] (fava) beans, lima and navy beans Ripe avocado, ripe banana Yeast extracts or active yeast preparations such as Brewer's or Fleishman's (commercial bakes goods are permitted) Tomato based foods, pizza (lasagna, etc.)   MSG (monosodium glutamate) is disguised as many things; look for these common aliases: Monopotassium glutamate Autolysed yeast Hydrolysed protein Sodium caseinate "flavorings" "all natural preservatives" Nutrasweet   Avoid all other foods that convincingly provoke headaches.   Resources: The Dizzy [place] Laundry Your Headache Diet, migrainestrong.com  https://zamora-andrews.com/   Caffeine and Migraine For patients that have migraine, caffeine intake more than 3 days per week can lead to dependency and increased migraine frequency. I would recommend cutting back on your caffeine intake as best you can. The recommended amount of caffeine is 200-300 mg daily, although migraine patients may experience dependency at even lower doses. While you may notice an increase in headache temporarily, cutting back will be helpful for headaches in the long run. For more information on caffeine and migraine, visit:  https://americanmigrainefoundation.org/resource-library/caffeine-and-migraine/   Headache Prevention Strategies:   1. Maintain a headache diary; learn to identify and avoid triggers.  - This can be a simple note where you log when you had a headache, associated symptoms, and medications used - There are several smartphone apps developed to help track migraines: Migraine Buddy, Migraine Monitor, Curelator N1-Headache App   Common triggers include: Emotional triggers: Emotional/Upset family or friends Emotional/Upset occupation Business reversal/success Anticipation anxiety Crisis-serious Post-crisis periodNew job/position   Physical triggers: Vacation Day Weekend Strenuous Exercise High Altitude Location New Move Menstrual Day Physical Illness Oversleep/Not enough sleep Weather changes Light: Photophobia or light sesnitivity treatment involves a balance between desensitization and reduction in overly strong input. Use dark polarized glasses outside, but not inside. Avoid bright or fluorescent light, but do not dim environment to the point that going into a normally lit room hurts. Consider FL-41 tint lenses, which reduce the most irritating wavelengths without blocking too much light.  These can be obtained at axonoptics.com or theraspecs.com Foods: see list above.   2. Limit use of acute treatments (over-the-counter medications, triptans, etc.) to no more than 2 days per week or 10 days per month to prevent medication overuse headache (rebound headache).     3. Follow a regular schedule (including weekends and holidays): Don't skip meals. Eat a balanced diet. 8 hours of sleep nightly. Minimize stress. Exercise 30 minutes per day. Being overweight is associated  with a 5 times increased risk of chronic migraine. Keep well hydrated and drink 6-8 glasses of water per day.   4. Initiate non-pharmacologic measures at the earliest onset of your headache. Rest and quiet  environment. Relax and reduce stress. Breathe2Relax is a free app that can instruct you on    some simple relaxtion and breathing techniques. Http://Dawnbuse.com is a    free website that provides teaching videos on relaxation.  Also, there are  many apps that   can be downloaded for "mindful" relaxation.  An app called YOGA NIDRA will help walk you through mindfulness. Another app called Calm can be downloaded to give you a structured mindfulness guide with daily reminders and skill development. Headspace for guided meditation Mindfulness Based Stress Reduction Online Course: www.palousemindfulness.com Cold compresses.   5. Don't wait!! Take the maximum allowable dosage of prescribed medication at the first sign of migraine.   6. Compliance:  Take prescribed medication regularly as directed and at the first sign of a migraine.   7. Communicate:  Call your physician when problems arise, especially if your headaches change, increase in frequency/severity, or become associated with neurological symptoms (weakness, numbness, slurred speech, etc.).   8. Headache/pain management therapies: Consider various complementary methods, including medication, behavioral therapy, psychological counselling, biofeedback, massage therapy, acupuncture, dry needling, and other modalities.  Such measures may reduce the need for medications. Counseling for pain management, where patients learn to function and ignore/minimize their pain, seems to work very well.   9. Recommend changing family's attention and focus away from patient's headaches. Instead, emphasize daily activities. If first question of day is 'How are your headaches/Do you have a headache today?', then patient will constantly think about headaches, thus making them worse. Goal is to re-direct attention away from headaches, toward daily activities and other distractions.   10. Helpful  Websites: www.AmericanHeadacheSociety.org PatentHood.ch www.headaches.org TightMarket.nl www.achenet.org

## 2022-10-01 ENCOUNTER — Ambulatory Visit (INDEPENDENT_AMBULATORY_CARE_PROVIDER_SITE_OTHER): Payer: 59 | Admitting: Family Medicine

## 2022-10-01 ENCOUNTER — Encounter: Payer: Self-pay | Admitting: Family Medicine

## 2022-10-01 VITALS — BP 116/75 | HR 93 | Ht 66.0 in | Wt 164.5 lb

## 2022-10-01 DIAGNOSIS — G43009 Migraine without aura, not intractable, without status migrainosus: Secondary | ICD-10-CM

## 2022-10-01 MED ORDER — UBRELVY 100 MG PO TABS: 100.0000 mg | ORAL_TABLET | Freq: Every day | ORAL | 11 refills | Status: DC | PRN

## 2022-10-01 MED ORDER — GABAPENTIN 300 MG PO CAPS: 300.0000 mg | ORAL_CAPSULE | Freq: Every day | ORAL | 3 refills | Status: DC

## 2022-10-19 ENCOUNTER — Ambulatory Visit: Payer: 59 | Admitting: Family Medicine

## 2022-10-20 ENCOUNTER — Telehealth: Payer: Self-pay | Admitting: Pharmacy Technician

## 2022-10-20 NOTE — Telephone Encounter (Signed)
Patient Advocate Encounter   Received notification that prior authorization for Emgality 120MG /ML auto-injectors (migraine) is required.   PA submitted on 10/20/2022 Key [id number] Insurance [employer] Electronic Prior Authorization Form Status is pending

## 2022-10-21 ENCOUNTER — Other Ambulatory Visit (HOSPITAL_COMMUNITY): Payer: Self-pay

## 2022-10-21 NOTE — Telephone Encounter (Signed)
Checked status on covermymeds. Response: "Previously Approved Case #: [id number] Date of approval: 08/13/2022-06/07/2024We received a prior authorization request for the member and product listed above. The [facility] Prior Notification Team is not able to review this request because the requested medication has been previously approved under [id number]. Based on the information reviewed, the requested prescription is currently authorized for coverage by the plan until 08/13/2022-11/13/2022 . Please resubmit this request within 30 days of authorization expiration date."

## 2022-10-22 ENCOUNTER — Telehealth: Payer: Self-pay | Admitting: *Deleted

## 2022-10-22 NOTE — Telephone Encounter (Signed)
Submitted PA Ubrelvy on covermymeds. Key: [id number]. Waiting on determination from [employer].

## 2022-10-26 NOTE — Telephone Encounter (Signed)
"  Request Reference Number: [id number]. UBRELVY TAB 100MG  is approved through 10/22/2023. For further questions, call [employer] at [phone]."

## 2022-11-12 ENCOUNTER — Telehealth: Payer: Self-pay

## 2022-11-12 ENCOUNTER — Other Ambulatory Visit (HOSPITAL_COMMUNITY): Payer: Self-pay

## 2022-11-12 NOTE — Telephone Encounter (Deleted)
   Yes 10 tabs for 30 days.

## 2022-11-12 NOTE — Telephone Encounter (Signed)
   We always run a test claim when we receive a PA request to see if it is needed and per test claim it appears she will need a new PA when she goes to refill again.

## 2022-11-12 NOTE — Telephone Encounter (Signed)
Monica- I see a recent note from me that Bernita Raisin was approved: ""Request Reference Number: [id number]. UBRELVY TAB 100MG  is approved through 10/22/2023. For further questions, call [employer] at [phone]."  Does this need to be done?

## 2022-11-12 NOTE — Telephone Encounter (Signed)
I called the insurance company and was told the reason it was not going thru the test claim was because the PT has two [facility] plans, the PA itself was submitted correctly with the correct ID number for the commercial or primary plan payor however even though it was sent via CMM when it was processed the approval went under the other [facility] plan. We did a PA over the phone to try to avoid that happening again.  PA was approved Approval dates are 11/12/2022-11/12/2023  PA# [id number]  Max QTY of 8 tabs per 30 days is all that is allowed with this [facility] plan.  Zero cost per [facility] test claim.  Call Reference# [id number]  On call for 23 mins to complete the PA.

## 2022-11-12 NOTE — Telephone Encounter (Signed)
Are you running it for 10 tabs per 30 days?

## 2022-11-12 NOTE — Telephone Encounter (Signed)
Noted  

## 2022-11-12 NOTE — Telephone Encounter (Signed)
Received a PA renewal for Ubrelvy-PT has not been seen or evaluated since starting the Ubrelvy-Please advise    PA questions might also ask the number of H/A days, How long are they lasting or if there has been a decrease in overall H/A days and severity ect.

## 2022-12-08 ENCOUNTER — Other Ambulatory Visit (HOSPITAL_COMMUNITY): Payer: Self-pay

## 2022-12-08 ENCOUNTER — Telehealth: Payer: Self-pay

## 2022-12-08 NOTE — Telephone Encounter (Signed)
Pharmacy Patient Advocate Encounter   Received notification from [facility] that prior authorization for Emgality 120MG /ML auto-injectors (migraine) is required/requested.   PA submitted to [facility] via CoverMyMeds Key or [facility]) confirmation # [id number] Status is pending

## 2022-12-15 ENCOUNTER — Other Ambulatory Visit (HOSPITAL_COMMUNITY): Payer: Self-pay

## 2022-12-15 NOTE — Telephone Encounter (Signed)
Previous PA was denied-resubmitted as initial authorization.  Pharmacy Patient Advocate Encounter   Received notification from [facility] that prior authorization for Emgality 120MG /ML auto-injectors (migraine) is required/requested.   PA submitted to [facility] via CoverMyMeds Key or (Medicaid) confirmation # [id number] Status is pending

## 2022-12-16 ENCOUNTER — Other Ambulatory Visit (HOSPITAL_COMMUNITY): Payer: Self-pay

## 2022-12-16 NOTE — Telephone Encounter (Signed)
Pharmacy Patient Advocate Encounter  Prior Authorization for Emgality 120MG /ML auto-injectors (migraine) has been APPROVED by [employer] from 12/15/2022 to 03/17/2023.   PA #  [id number]

## 2023-01-26 ENCOUNTER — Emergency Department (HOSPITAL_BASED_OUTPATIENT_CLINIC_OR_DEPARTMENT_OTHER)
Admission: EM | Admit: 2023-01-26 | Discharge: 2023-01-26 | Disposition: A | Discharge status: Home / Self Care | Payer: 59

## 2023-01-26 ENCOUNTER — Other Ambulatory Visit: Payer: Self-pay

## 2023-01-26 ENCOUNTER — Emergency Department (HOSPITAL_BASED_OUTPATIENT_CLINIC_OR_DEPARTMENT_OTHER): Payer: 59

## 2023-01-26 ENCOUNTER — Encounter (HOSPITAL_BASED_OUTPATIENT_CLINIC_OR_DEPARTMENT_OTHER): Payer: Self-pay | Admitting: Urology

## 2023-01-26 DIAGNOSIS — G43001 Migraine without aura, not intractable, with status migrainosus: Secondary | ICD-10-CM | POA: Insufficient documentation

## 2023-01-26 DIAGNOSIS — J301 Allergic rhinitis due to pollen: Secondary | ICD-10-CM | POA: Diagnosis not present

## 2023-01-26 DIAGNOSIS — R519 Headache, unspecified: Secondary | ICD-10-CM | POA: Diagnosis present

## 2023-01-26 LAB — PREGNANCY, URINE: Preg Test, Ur: NEGATIVE

## 2023-01-26 MED ORDER — SODIUM CHLORIDE 0.9 % IV BOLUS
1000.0000 mL | Freq: Once | INTRAVENOUS | Status: AC
  Administered 2023-01-26: 1000 mL via INTRAVENOUS

## 2023-01-26 MED ORDER — SODIUM CHLORIDE 0.9 % IV SOLN: INTRAVENOUS | Status: DC

## 2023-01-26 MED ORDER — DIPHENHYDRAMINE HCL 50 MG/ML IJ SOLN
12.5000 mg | Freq: Once | INTRAMUSCULAR | Status: AC
  Administered 2023-01-26: 12.5 mg via INTRAVENOUS
  Filled 2023-01-26: qty 1

## 2023-01-26 MED ORDER — ONDANSETRON 4 MG PO TBDP: 4.0000 mg | ORAL_TABLET | Freq: Three times a day (TID) | ORAL | 0 refills | Status: DC | PRN

## 2023-01-26 MED ORDER — KETOROLAC TROMETHAMINE 15 MG/ML IJ SOLN
15.0000 mg | Freq: Once | INTRAMUSCULAR | Status: AC
  Administered 2023-01-26: 15 mg via INTRAVENOUS
  Filled 2023-01-26: qty 1

## 2023-01-26 MED ORDER — FLUTICASONE PROPIONATE 50 MCG/ACT NA SUSP
2.0000 | Freq: Every day | NASAL | Status: DC
  Administered 2023-01-26: 2 via NASAL
  Filled 2023-01-26: qty 16

## 2023-01-26 MED ORDER — PROCHLORPERAZINE EDISYLATE 10 MG/2ML IJ SOLN
10.0000 mg | Freq: Once | INTRAMUSCULAR | Status: AC
  Administered 2023-01-26: 10 mg via INTRAVENOUS
  Filled 2023-01-26: qty 2

## 2023-01-26 NOTE — ED Provider Notes (Signed)
[place] [facility] Provider Note   CSN: [id number] Arrival date & time: 01/26/23  1445     History  Chief Complaint  Patient presents with   Migraine    Vicki Harper is a 27 y.o. female with history of migraine headaches who presents with concern for a migraine that has been ongoing for the past week.  She states it is left-sided which is typical for her.  However, it has not responded to her normal Ubrelvy and ibuprofen. She is also on Emgality shots every month for migraines and has not missed any doses.  The severity has waxed and waned over the past week, but it has never fully resolved.  She does have light sensitivity.  Denies any changes in vision, fevers or chills, or neck stiffness. She also notes some nasal congestion for the past 2 days that she thinks is due to her seasonal allergies.   Migraine Associated symptoms include headaches.       Home Medications Prior to Admission medications   Medication Sig Start Date End Date Taking? Authorizing Provider  ondansetron (ZOFRAN-ODT) 4 MG disintegrating tablet Take 1 tablet (4 mg total) by mouth every 8 (eight) hours as needed for nausea or vomiting. 01/26/23  Yes Arabella Merles, PA-C  amoxicillin (AMOXIL) 500 MG capsule Take 2 capsules (1,000 mg total) by mouth 2 (two) times daily. Patient not taking: Reported on 10/01/2022 03/30/22   Melene Plan, DO  ARIPiprazole (ABILIFY) 5 MG tablet Take 5 mg by mouth daily.    [provider]  benzonatate (TESSALON) 100 MG capsule Take 1 capsule (100 mg total) by mouth every 8 (eight) hours. Patient not taking: Reported on 10/01/2022 03/30/22   Melene Plan, DO  EPINEPHrine 0.3 mg/0.3 mL IJ SOAJ injection SMARTSIG:IM As Directed PRN 11/05/21   [provider]  ferrous sulfate (FERROUSUL) 325 (65 FE) MG tablet Take 1 tablet (325 mg total) by mouth daily with breakfast. Patient not taking: Reported on 10/01/2022 04/15/20   Levie Heritage, DO  fluorometholone (FML) 0.1 % ophthalmic suspension SMARTSIG:In Eye(s) 09/18/21   [provider]  FLUoxetine (PROZAC) 20 MG tablet Take 30 mg by mouth daily. 09/11/21   [provider]  fluticasone-salmeterol (ADVAIR HFA) 230-21 MCG/ACT inhaler Inhale 2 puffs into the lungs 2 (two) times daily.    [provider]  gabapentin (NEURONTIN) 300 MG capsule Take 1 capsule (300 mg total) by mouth at bedtime. 10/01/22   Lomax, Amy, NP  Galcanezumab-gnlm (EMGALITY) 120 MG/ML SOAJ Inject 120 mg into the skin every 30 (thirty) days. Patient not taking: Reported on 10/01/2022 08/12/22   Shawnie Dapper, NP  ibuprofen (ADVIL) 600 MG tablet Take 1 tablet (600 mg total) by mouth every 6 (six) hours as needed. 05/20/22   Mannie Stabile, PA-C  ibuprofen (ADVIL) 800 MG tablet Take 1 tablet (800 mg total) by mouth every 8 (eight) hours as needed (pain). 11/06/21   Banister, Janace Aris, MD  LO LOESTRIN FE 1 MG-10 MCG / 10 MCG tablet Take by mouth. 09/11/21   [provider]  montelukast (SINGULAIR) 10 MG tablet Take 10 mg by mouth daily. 11/05/21   [provider]  oxyCODONE-acetaminophen (PERCOCET) 5-325 MG tablet Take 1-2 tablets by mouth every 8 (eight) hours as needed. 04/02/22   Mesner, Barbara Cower, MD  tiZANidine (ZANAFLEX) 2 MG tablet Take 2-4 mg by mouth at bedtime. Patient not taking: Reported on 11/06/2021    [provider]  traZODone (DESYREL) 50 MG tablet Take 50-100 mg by mouth at bedtime as needed. 07/02/21   [provider]  Ubrogepant (UBRELVY) 100 MG TABS Take 1 tablet (100 mg total) by mouth daily as needed. Take one tablet at onset of headache, may repeat 1 tablet in 2 hours, no more than 2 tablets in 24 hours 10/01/22   Lomax, Amy, NP  Vitamin D, Ergocalciferol, (DRISDOL) 1.25 MG (50000 UNIT) CAPS capsule Take 50,000 Units by mouth once a week. 09/17/21   [provider]      Allergies    Penicillins    Review of Systems   Review of  Systems  Eyes:  Positive for photophobia.  Neurological:  Positive for headaches.    Physical Exam Updated Vital Signs BP 112/77 (BP Location: Left Arm)   Pulse 72   Temp 98.1 F (36.7 C)   Resp 18   Ht 5\' 6"  (1.676 m)   Wt 77.1 kg   SpO2 96%   BMI 27.44 kg/m  Physical Exam Vitals and nursing note reviewed.  Constitutional:      Appearance: Normal appearance.  HENT:     Head: Normocephalic and atraumatic.  Eyes:     Extraocular Movements: Extraocular movements intact.     Conjunctiva/sclera: Conjunctivae normal.     Pupils: Pupils are equal, round, and reactive to light.  Cardiovascular:     Rate and Rhythm: Normal rate and regular rhythm.  Pulmonary:     Effort: Pulmonary effort is normal.  Neurological:     General: No focal deficit present.     Mental Status: She is alert.     Comments: Cranial nerves III through XII intact 5/5 strength in the upper and lower extremities bilaterally Patient able to ambulate without difficulty  Psychiatric:        Mood and Affect: Mood normal.        Behavior: Behavior normal.     ED Results / Procedures / Treatments   Labs (all labs ordered are listed, but only abnormal results are displayed) Labs Reviewed  PREGNANCY, URINE    EKG None  Radiology CT Head Wo Contrast  Result Date: 01/26/2023 CLINICAL DATA:  Headache, increasing frequency or severity. EXAM: CT HEAD WITHOUT CONTRAST TECHNIQUE: Contiguous axial images were obtained from the base of the skull through the vertex without intravenous contrast. RADIATION DOSE REDUCTION: This exam was performed according to the departmental dose-optimization program which includes automated exposure control, adjustment of the mA and/or kV according to patient size and/or use of iterative reconstruction technique. COMPARISON:  Head CT 11/15/2020. FINDINGS: Brain: No acute intracranial hemorrhage. Gray-white differentiation is preserved. No hydrocephalus or extra-axial collection. No  mass effect or midline shift. Vascular: No hyperdense vessel or unexpected calcification. Skull: No calvarial fracture or suspicious bone lesion. Skull base is unremarkable. Sinuses/Orbits: No acute finding. Other: None. IMPRESSION: No acute intracranial abnormality. Electronically Signed   By: Orvan Falconer M.D.   On: 01/26/2023 15:38    Procedures Procedures    Medications Ordered in ED Medications  fluticasone (FLONASE) 50 MCG/ACT nasal spray 2 spray (2 sprays Each Nare Given 01/26/23 1551)  sodium chloride 0.9 % bolus 1,000 mL ( Intravenous Stopped 01/26/23 1654)  prochlorperazine (COMPAZINE) injection 10 mg (10 mg Intravenous Given 01/26/23 1554)  diphenhydrAMINE (BENADRYL) injection 12.5 mg (12.5 mg Intravenous Given 01/26/23 1554)  ketorolac (TORADOL) 15 MG/ML injection 15 mg (15 mg Intravenous Given 01/26/23 1553)    ED Course/ Medical Decision Making/ A&P  Medical Decision Making Amount and/or Complexity of Data Reviewed Labs: ordered. Radiology: ordered.   27 y.o. female with pertinent past medical history of migraine presents to the ED for concern of migraine ongoing for 7 days  Differential diagnosis includes but is not limited to migraine, brain bleed, intracranial mass, meningitis  ED Course:  Patient presents with concern for a migraine that has been ongoing for the past 7 days.  The intensity have been fluctuating, but has not completely resolved.  She has tried her Bernita Raisin and ibuprofen without relief.  She is also on Emgality shots for her migraines and has not missed any doses.  The headache is left-sided which is typical for her.  She does not have any vision changes, fever, neck stiffness.  Vital signs within normal limits.  No suspicion for meningitis at this time.  She has not had any head trauma, think brain bleed less likely.  Neurological exam is unremarkable-cranial nerves II through XII intact, 5 out of 5 strength in the upper and  lower extremities bilaterally, able to ambulate without difficulty.  However, given that this has been ongoing for more than 72 hours, will obtain CT head to rule out any intracranial abnormality that may be causing the headache. CT head with no intracranial abnormalities.  Believe her symptoms are due to migraine, will proceed with IV normal saline bolus, Compazine, Toradol, Benadryl to help with her migraine Upon re-evaluation after the fluid bolus and IV medications, patient reports her headache has completely resolved.  Her nasal congestion has improved with the Flonase.  She is not feeling sleepy and feels she can drive home.  She asked about using Toradol at home to help with migraines as the ibuprofen did not seem to be as effective.  However, I advised her to follow-up with her primary to discuss further medication therapy for her migraines as the oral Toradol is only indicated for continuation of IM or IV treatment.  She is not currently having any more symptoms and does not need any further medication at home at this time.  I discussed that I will prescribe her Zofran for use at home to use when she has nausea and vomiting associated with her migraines.  Impression: Migraine, resolved Allergic rhinitis  Disposition:  The patient was discharged home with instructions to follow-up with her PCP regarding management of her migraines.  She was given a prescription for Zofran to use for migraine attacks. Return precautions given.  Lab Tests: I Ordered, and personally interpreted labs.  The pertinent results include:   Pregnancy negative  Imaging Studies ordered: I ordered imaging studies including CT head I independently visualized the imaging with scope of interpretation limited to determining acute life threatening conditions related to emergency care. Imaging showed no acute intracranial abnormalities I agree with the radiologist interpretation   Co morbidities that complicate the  patient evaluation  History of migraine              Final Clinical Impression(s) / ED Diagnoses Final diagnoses:  Migraine without aura and with status migrainosus, not intractable  Seasonal allergic rhinitis due to pollen    Rx / DC Orders ED Discharge Orders          Ordered    ondansetron (ZOFRAN-ODT) 4 MG disintegrating tablet  Every 8 hours PRN        01/26/23 1742              Arabella Merles, PA-C 01/26/23 1748    Long,  Arlyss Repress, MD 01/27/23 0005

## 2023-01-26 NOTE — ED Triage Notes (Signed)
Pt states migraine since Thursday  H/o same and takes [place]  Reports nausea, sensitivity to light

## 2023-01-26 NOTE — Discharge Instructions (Addendum)
You were seen here today for your migraine.    You have been prescribed Zofran to use at home as needed for nausea and vomiting associated with your migraines.  Please continue to use your Ubrelvy and ibuprofen as prescribed by her PCP for migraines.  Please follow-up with your PCP to discuss further management of your migraines.  You have also been given Flonase (fluticasone) nasal spray here today.  You may use 2 sprays per nostril daily as needed for nasal congestion.  This medication is also available over-the-counter if you need to get this medication in the future.  Please return to the ER if you develop a migraine that will not resolve within 72 hours, any migraine that is not consistent with your normal migraines, any sudden severe headache, vision changes, any other new or concerning symptoms.

## 2023-03-03 ENCOUNTER — Telehealth: Payer: Self-pay | Admitting: Pharmacy Technician

## 2023-03-03 NOTE — Telephone Encounter (Signed)
Pharmacy Patient Advocate Encounter   Received notification from CoverMyMeds that prior authorization for Emgality 120MG /ML auto-injectors (migraine) is required/requested.   Insurance verification completed.   The patient is insured through [facility] .   Per test claim: PA required; PA submitted to [facility] via CoverMyMeds Key/confirmation #/EOC [id number] Status is pending

## 2023-03-10 NOTE — Telephone Encounter (Signed)
Pharmacy Patient Advocate Encounter  Received notification from [facility] that Prior Authorization for Emgality 120MG /ML auto-injectors (migraine) has been APPROVED from 03/03/2023 to 03/02/2024   PA #/Case ID/Reference #: [id number]
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
## 2023-04-05 ENCOUNTER — Encounter: Payer: Self-pay | Admitting: Family Medicine

## 2023-04-05 ENCOUNTER — Ambulatory Visit (INDEPENDENT_AMBULATORY_CARE_PROVIDER_SITE_OTHER): Payer: 59 | Admitting: Family Medicine

## 2023-04-05 VITALS — BP 109/67 | HR 74 | Ht 66.0 in | Wt 171.0 lb

## 2023-04-05 DIAGNOSIS — G43009 Migraine without aura, not intractable, without status migrainosus: Secondary | ICD-10-CM | POA: Diagnosis not present

## 2023-04-05 MED ORDER — QULIPTA 60 MG PO TABS: 60.0000 mg | ORAL_TABLET | Freq: Every day | ORAL | 3 refills | Status: DC

## 2023-04-05 MED ORDER — UBRELVY 100 MG PO TABS: 100.0000 mg | ORAL_TABLET | Freq: Every day | ORAL | 11 refills | Status: DC | PRN

## 2023-04-05 NOTE — Patient Instructions (Signed)
Below is our plan:  We will stop Emgality and start Qulipta 60mg  daily. Continue Ubrelvy as needed.   Please make sure you are staying well hydrated. I recommend 50-60 ounces daily. Well balanced diet and regular exercise encouraged. Consistent sleep schedule with 6-8 hours recommended.   Please continue follow up with care team as directed.   Follow up with me in 6-8 months   You may receive a survey regarding today's visit. I encourage you to leave honest feed back as I do use this information to improve patient care. Thank you for seeing me today!   GENERAL HEADACHE INFORMATION:   Natural supplements: Magnesium Oxide or Magnesium Glycinate 500 mg at bed (up to 800 mg daily) Coenzyme Q10 300 mg in AM Vitamin B2- 200 mg twice a day   Add 1 supplement at a time since even natural supplements can have undesirable side effects. You can sometimes buy supplements cheaper (especially Coenzyme Q10) at www.WebmailGuide.co.za or at [facility].  Migraine with aura: There is increased risk for stroke in women with migraine with aura and a contraindication for the combined contraceptive pill for use by women who have migraine with aura. The risk for women with migraine without aura is lower. However other risk factors like smoking are far more likely to increase stroke risk than migraine. There is a recommendation for no smoking and for the use of OCPs without estrogen such as progestogen only pills particularly for women with migraine with aura.Marland Kitchen People who have migraine headaches with auras may be 3 times more likely to have a stroke caused by a blood clot, compared to migraine patients who don't see auras. Women who take hormone-replacement therapy may be 30 percent more likely to suffer a clot-based stroke than women not taking medication containing estrogen. Other risk factors like smoking and high blood pressure may be  much more important.    Vitamins and herbs that show potential:   Magnesium: Magnesium  (250 mg twice a day or 500 mg at bed) has a relaxant effect on smooth muscles such as blood vessels. Individuals suffering from frequent or daily headache usually have low magnesium levels which can be increase with daily supplementation of 400-750 mg. Three trials found 40-90% average headache reduction  when used as a preventative. Magnesium may help with headaches are aura, the best evidence for magnesium is for migraine with aura is its thought to stop the cortical spreading depression we believe is the pathophysiology of migraine aura.Magnesium also demonstrated the benefit in menstrually related migraine.  Magnesium is part of the messenger system in the serotonin cascade and it is a good muscle relaxant.  It is also useful for constipation which can be a side effect of other medications used to treat migraine. Good sources include nuts, whole grains, and tomatoes. Side Effects: loose stool/diarrhea  Riboflavin (vitamin B 2) 200 mg twice a day. This vitamin assists nerve cells in the production of ATP a principal energy storing molecule.  It is necessary for many chemical reactions in the body.  There have been at least 3 clinical trials of riboflavin using 400 mg per day all of which suggested that migraine frequency can be decreased.  All 3 trials showed significant improvement in over half of migraine sufferers.  The supplement is found in bread, cereal, milk, meat, and poultry.  Most Americans get more riboflavin than the recommended daily allowance, however riboflavin deficiency is not necessary for the supplements to help prevent headache. Side effects: energizing, green  urine   Coenzyme Q10: This is present in almost all cells in the body and is critical component for the conversion of energy.  Recent studies have shown that a nutritional supplement of CoQ10 can reduce the frequency of migraine attacks by improving the energy production of cells as with riboflavin.  Doses of 150 mg twice a day have  been shown to be effective.   Melatonin: Increasing evidence shows correlation between melatonin secretion and headache conditions.  Melatonin supplementation has decreased headache intensity and duration.  It is widely used as a sleep aid.  Sleep is natures way of dealing with migraine.  A dose of 3 mg is recommended to start for headaches including cluster headache. Higher doses up to 15 mg has been reviewed for use in Cluster headache and have been used. The rationale behind using melatonin for cluster is that many theories regarding the cause of Cluster headache center around the disruption of the normal circadian rhythm in the brain.  This helps restore the normal circadian rhythm.   HEADACHE DIET: Foods and beverages which may trigger migraine Note that only 20% of headache patients are food sensitive. You will know if you are food sensitive if you get a headache consistently 20 minutes to 2 hours after eating a certain food. Only cut out a food if it causes headaches, otherwise you might remove foods you enjoy! What matters most for diet is to eat a well balanced healthy diet full of vegetables and low fat protein, and to not miss meals.   Chocolate, other sweets ALL cheeses except cottage and cream cheese Dairy products, yogurt, sour cream, ice cream Liver Meat extracts (Bovril, Marmite, meat tenderizers) Meats or fish which have undergone aging, fermenting, pickling or smoking. These include: Hotdogs,salami,Lox,sausage, mortadellas,smoked salmon, pepperoni, Pickled herring Pods of broad bean (English beans, Chinese pea pods, [place] (fava) beans, lima and navy beans Ripe avocado, ripe banana Yeast extracts or active yeast preparations such as Brewer's or Fleishman's (commercial bakes goods are permitted) Tomato based foods, pizza (lasagna, etc.)   MSG (monosodium glutamate) is disguised as many things; look for these common aliases: Monopotassium glutamate Autolysed yeast Hydrolysed  protein Sodium caseinate "flavorings" "all natural preservatives" Nutrasweet   Avoid all other foods that convincingly provoke headaches.   Resources: The Dizzy [place]air Laundry Your Headache Diet, migrainestrong.com  https://zamora-andrews.com/   Caffeine and Migraine For patients that have migraine, caffeine intake more than 3 days per week can lead to dependency and increased migraine frequency. I would recommend cutting back on your caffeine intake as best you can. The recommended amount of caffeine is 200-300 mg daily, although migraine patients may experience dependency at even lower doses. While you may notice an increase in headache temporarily, cutting back will be helpful for headaches in the long run. For more information on caffeine and migraine, visit: https://americanmigrainefoundation.org/resource-library/caffeine-and-migraine/   Headache Prevention Strategies:   1. Maintain a headache diary; learn to identify and avoid triggers.  - This can be a simple note where you log when you had a headache, associated symptoms, and medications used - There are several smartphone apps developed to help track migraines: Migraine Buddy, Migraine Monitor, Curelator N1-Headache App   Common triggers include: Emotional triggers: Emotional/Upset family or friends Emotional/Upset occupation Business reversal/success Anticipation anxiety Crisis-serious Post-crisis periodNew job/position   Physical triggers: Vacation Day Weekend Strenuous Exercise High Altitude Location New Move Menstrual Day Physical Illness Oversleep/Not enough sleep Weather changes Light: Photophobia or light sesnitivity treatment involves a balance between  desensitization and reduction in overly strong input. Use dark polarized glasses outside, but not inside. Avoid bright or fluorescent light, but do not dim environment to the point that going into a normally lit room  hurts. Consider FL-41 tint lenses, which reduce the most irritating wavelengths without blocking too much light.  These can be obtained at axonoptics.com or theraspecs.com Foods: see list above.   2. Limit use of acute treatments (over-the-counter medications, triptans, etc.) to no more than 2 days per week or 10 days per month to prevent medication overuse headache (rebound headache).     3. Follow a regular schedule (including weekends and holidays): Don't skip meals. Eat a balanced diet. 8 hours of sleep nightly. Minimize stress. Exercise 30 minutes per day. Being overweight is associated with a 5 times increased risk of chronic migraine. Keep well hydrated and drink 6-8 glasses of water per day.   4. Initiate non-pharmacologic measures at the earliest onset of your headache. Rest and quiet environment. Relax and reduce stress. Breathe2Relax is a free app that can instruct you on    some simple relaxtion and breathing techniques. Http://Dawnbuse.com is a    free website that provides teaching videos on relaxation.  Also, there are  many apps that   can be downloaded for "mindful" relaxation.  An app called YOGA NIDRA will help walk you through mindfulness. Another app called Calm can be downloaded to give you a structured mindfulness guide with daily reminders and skill development. Headspace for guided meditation Mindfulness Based Stress Reduction Online Course: www.palousemindfulness.com Cold compresses.   5. Don't wait!! Take the maximum allowable dosage of prescribed medication at the first sign of migraine.   6. Compliance:  Take prescribed medication regularly as directed and at the first sign of a migraine.   7. Communicate:  Call your physician when problems arise, especially if your headaches change, increase in frequency/severity, or become associated with neurological symptoms (weakness, numbness, slurred speech, etc.). Proceed to emergency room if you experience new or worsening  symptoms or symptoms do not resolve, if you have new neurologic symptoms or if headache is severe, or for any concerning symptom.   8. Headache/pain management therapies: Consider various complementary methods, including medication, behavioral therapy, psychological counselling, biofeedback, massage therapy, acupuncture, dry needling, and other modalities.  Such measures may reduce the need for medications. Counseling for pain management, where patients learn to function and ignore/minimize their pain, seems to work very well.   9. Recommend changing family's attention and focus away from patient's headaches. Instead, emphasize daily activities. If first question of day is 'How are your headaches/Do you have a headache today?', then patient will constantly think about headaches, thus making them worse. Goal is to re-direct attention away from headaches, toward daily activities and other distractions.   10. Helpful Websites: www.AmericanHeadacheSociety.org PatentHood.ch www.headaches.org TightMarket.nl www.achenet.org

## 2023-05-11 ENCOUNTER — Emergency Department (HOSPITAL_BASED_OUTPATIENT_CLINIC_OR_DEPARTMENT_OTHER): Payer: 59

## 2023-05-11 ENCOUNTER — Other Ambulatory Visit: Payer: Self-pay

## 2023-05-11 ENCOUNTER — Encounter (HOSPITAL_BASED_OUTPATIENT_CLINIC_OR_DEPARTMENT_OTHER): Payer: Self-pay

## 2023-05-11 ENCOUNTER — Emergency Department (HOSPITAL_BASED_OUTPATIENT_CLINIC_OR_DEPARTMENT_OTHER)
Admission: EM | Admit: 2023-05-11 | Discharge: 2023-05-11 | Disposition: A | Discharge status: Home / Self Care | Payer: 59 | Attending: Emergency Medicine | Admitting: Emergency Medicine

## 2023-05-11 DIAGNOSIS — J45909 Unspecified asthma, uncomplicated: Secondary | ICD-10-CM | POA: Diagnosis not present

## 2023-05-11 DIAGNOSIS — T148XXA Other injury of unspecified body region, initial encounter: Secondary | ICD-10-CM

## 2023-05-11 DIAGNOSIS — Z1152 Encounter for screening for COVID-19: Secondary | ICD-10-CM | POA: Diagnosis not present

## 2023-05-11 DIAGNOSIS — M25512 Pain in left shoulder: Secondary | ICD-10-CM | POA: Diagnosis present

## 2023-05-11 LAB — RESP PANEL BY RT-PCR (RSV, FLU A&B, COVID)  RVPGX2
Influenza A by PCR: NEGATIVE
Influenza B by PCR: NEGATIVE
Resp Syncytial Virus by PCR: NEGATIVE
SARS Coronavirus 2 by RT PCR: NEGATIVE

## 2023-05-11 MED ORDER — LIDOCAINE 5 % EX PTCH
1.0000 | MEDICATED_PATCH | CUTANEOUS | Status: DC
  Administered 2023-05-11: 1 via TRANSDERMAL
  Filled 2023-05-11: qty 1

## 2023-05-11 MED ORDER — KETOROLAC TROMETHAMINE 15 MG/ML IJ SOLN
15.0000 mg | Freq: Once | INTRAMUSCULAR | Status: AC
  Administered 2023-05-11: 15 mg via INTRAMUSCULAR
  Filled 2023-05-11: qty 1

## 2023-05-11 NOTE — ED Triage Notes (Signed)
Pt arrives with c/o left shoulder pain that started Friday. Pt denies injury. Per pt, pain is worse with a deep breath and and radiates down into her ribcage.

## 2023-05-11 NOTE — ED Provider Notes (Signed)
[facility] Provider Note   CSN: [id number] Arrival date & time: 05/11/23  1826     History  Chief Complaint  Patient presents with   Shoulder Pain    Vicki Harper is a 27 y.o. female with a history of asthma, back pain, anxiety presents the ED today for shoulder pain.  Patient reports pain to the left shoulder for the past 5 days.  Denies any injury or trauma prior to onset of symptoms.  She states the pain goes from the left side of her neck down to her shoulder. She thinks she might have slept on it wrong.  Denies weakness, loss of sensation, or decreased strength to upper extremity.  Additionally, patient reports that she has been with the cold weather and has pain at the left ribs underneath her armpit with deep breathing, coughing, or sneezing.  No fevers, nausea, vomiting, abdominal pain, dysuria, or diarrhea.  Denies any sick contact at home or work.    Home Medications Prior to Admission medications   Medication Sig Start Date End Date Taking? Authorizing Provider  Atogepant (QULIPTA) 60 MG TABS Take 1 tablet (60 mg total) by mouth daily. 04/05/23   Lomax, Amy, NP  EPINEPHrine 0.3 mg/0.3 mL IJ SOAJ injection SMARTSIG:IM As Directed PRN 11/05/21   [provider]  fluticasone-salmeterol (ADVAIR HFA) 230-21 MCG/ACT inhaler Inhale 2 puffs into the lungs 2 (two) times daily.    [provider]  gabapentin (NEURONTIN) 300 MG capsule Take 1 capsule (300 mg total) by mouth at bedtime. 10/01/22   Lomax, Amy, NP  montelukast (SINGULAIR) 10 MG tablet Take 10 mg by mouth daily. Patient not taking: Reported on 04/05/2023 11/05/21   [provider]  Ubrogepant (UBRELVY) 100 MG TABS Take 1 tablet (100 mg total) by mouth daily as needed. Take one tablet at onset of headache, may repeat 1 tablet in 2 hours, no more than 2 tablets in 24 hours 04/05/23   Lomax, Amy, NP      Allergies    Penicillins    Review of  Systems   Review of Systems  Musculoskeletal:        Left shoulder pain  All other systems reviewed and are negative.   Physical Exam Updated Vital Signs BP 120/71 (BP Location: Right Arm)   Pulse 71   Temp 99 F (37.2 C) (Oral)   Resp 18   Wt 77.6 kg   SpO2 100%   BMI 27.60 kg/m  Physical Exam Vitals and nursing note reviewed.  Constitutional:      General: She is not in acute distress.    Appearance: Normal appearance.  HENT:     Head: Normocephalic and atraumatic.     Mouth/Throat:     Mouth: Mucous membranes are moist.  Eyes:     Conjunctiva/sclera: Conjunctivae normal.     Pupils: Pupils are equal, round, and reactive to light.  Cardiovascular:     Rate and Rhythm: Normal rate and regular rhythm.     Pulses: Normal pulses.     Heart sounds: Normal heart sounds.  Pulmonary:     Effort: Pulmonary effort is normal.     Breath sounds: Normal breath sounds.  Abdominal:     Palpations: Abdomen is soft.     Tenderness: There is no abdominal tenderness.  Musculoskeletal:        General: Tenderness present. No swelling or deformity. Normal range of motion.     Cervical back:  Normal range of motion and neck supple.     Comments: Tenderness to palpation of the trapezius muscle from the left lateral neck to the shoulder.  No deformity to the left shoulder tenderness to palpation of the anterior posterior aspect.  Range of motion, strength, and sensation intact of upper and lower extremities bilaterally.  Tenderness to palpation of left chest beneath the left axilla without noticeable deformity.  Skin:    General: Skin is warm and dry.     Findings: No rash.  Neurological:     General: No focal deficit present.     Mental Status: She is alert.     Sensory: No sensory deficit.     Motor: No weakness.  Psychiatric:        Mood and Affect: Mood normal.        Behavior: Behavior normal.     ED Results / Procedures / Treatments   Labs (all labs ordered are listed, but  only abnormal results are displayed) Labs Reviewed  RESP PANEL BY RT-PCR (RSV, FLU A&B, COVID)  RVPGX2    EKG None  Radiology DG Chest 2 View  Result Date: 05/11/2023 CLINICAL DATA:  Cough x1 week. EXAM: CHEST - 2 VIEW COMPARISON:  July 14, 2011 FINDINGS: The heart size and mediastinal contours are within normal limits. Both lungs are clear. The visualized skeletal structures are unremarkable. IMPRESSION: No active cardiopulmonary disease. Electronically Signed   By: Aram Candela M.D.   On: 05/11/2023 21:10   DG Shoulder Left  Result Date: 05/11/2023 CLINICAL DATA:  Several day history of left shoulder pain. No known injury. EXAM: LEFT SHOULDER - 3 VIEW COMPARISON:  None Available. FINDINGS: There is no evidence of fracture or dislocation. There is no evidence of arthropathy or other focal bone abnormality. Soft tissues are unremarkable. IMPRESSION: No focal radiographic abnormality. Electronically Signed   By: Agustin Cree M.D.   On: 05/11/2023 20:41    Procedures Procedures: not indicated.   Medications Ordered in ED Medications  lidocaine (LIDODERM) 5 % 1 patch (1 patch Transdermal Patch Applied 05/11/23 2049)  ketorolac (TORADOL) 15 MG/ML injection 15 mg (15 mg Intramuscular Given 05/11/23 2050)    ED Course/ Medical Decision Making/ A&P                                 Medical Decision Making Amount and/or Complexity of Data Reviewed Radiology: ordered.  Risk Prescription drug management.   This patient presents to the ED for concern of left shoulder pain and cough, this involves an extensive number of treatment options, and is a complaint that carries with it a high risk of complications and morbidity.   Differential diagnosis includes: Fracture, dislocation, arthritic changes, muscle strain, contusion, pneumonia, flu, RSV, COVID, etc.   Comorbidities  See HPI above   Additional History  Additional history obtained from prior records.   Lab Tests  I  ordered and personally interpreted labs.  The pertinent results include:   Negative respiratory panel.   Imaging Studies  I ordered imaging studies including left shoulder and chest x-rays.  I independently visualized and interpreted imaging which showed:  Left shoulder x-ray showed no acute osseous processes. Chest x-ray showed no active cardiopulmonary disease.  The visualized skeletal structures are unremarkable. I agree with the radiologist interpretation   Problem List / ED Course / Critical Interventions / Medication Management  Left shoulder pain I ordered medications including:  Toradol and Lidocaine patch for pain  Reevaluation of the patient after these medicines showed that the patient improved some. I have reviewed the patients home medicines and have made adjustments as needed   Social Determinants of Health  Access to healthcare   Test / Admission - Considered  Estimated with patient.   She is hemodynamically stable and safe for discharge home. Return precautions given.       Final Clinical Impression(s) / ED Diagnoses Final diagnoses:  Muscle strain  Acute pain of left shoulder    Rx / DC Orders ED Discharge Orders     None         Maxwell Marion, PA-C 05/12/23 0019    Loetta Rough, MD 05/15/23 [phone]

## 2023-05-11 NOTE — Discharge Instructions (Addendum)
As discussed, your labs and imaging are reassuring.  You can alternate between Tylenol and Ibuprofen every 4 hours as needed for pain.  You can also get over the counter lidocaine patches to apply at the area of pain as well.  Follow-up with your primary care in the next 5 to 7 days for reevaluation of your symptoms.  Contact a doctor if: Your pain gets worse. Medicine does not help your pain. You have new pain in your arm, hand, or fingers. You loosen your sling and your arm, hand, or fingers: Tingle. Are numb. Are swollen.

## 2023-05-18 ENCOUNTER — Encounter: Payer: Self-pay | Admitting: Family Medicine

## 2023-06-04 ENCOUNTER — Other Ambulatory Visit (HOSPITAL_COMMUNITY): Payer: Self-pay

## 2023-06-04 ENCOUNTER — Telehealth: Payer: Self-pay

## 2023-06-04 NOTE — Telephone Encounter (Signed)
Pharmacy Patient Advocate Encounter   Received notification from CoverMyMeds that prior authorization for Qulipta 60MG  tablets is required/requested.   Insurance verification completed.   The patient is insured through [facility] .   Per test claim: PA required; PA submitted to above mentioned insurance via CoverMyMeds Key/confirmation #/EOC [id number] Status is pending

## 2023-06-07 ENCOUNTER — Other Ambulatory Visit (HOSPITAL_COMMUNITY): Payer: Self-pay

## 2023-06-07 NOTE — Telephone Encounter (Signed)
Pharmacy Patient Advocate Encounter  Received notification from [facility] that Prior Authorization for Qulipta 60MG  tablets has been APPROVED from 06/04/2023 to 06/03/2024. Ran test claim, Copay is $0. This test claim was processed through [facility]- copay amounts may vary at other pharmacies due to pharmacy/plan contracts, or as the patient moves through the different stages of their insurance plan.   PA #/Case ID/Reference #: PA Case ID #: [id number]

## 2023-09-30 ENCOUNTER — Encounter: Payer: Self-pay | Admitting: Family Medicine

## 2023-10-05 ENCOUNTER — Other Ambulatory Visit (HOSPITAL_COMMUNITY): Payer: Self-pay

## 2023-10-05 ENCOUNTER — Telehealth: Payer: Self-pay

## 2023-10-05 NOTE — Telephone Encounter (Signed)
 Pharmacy Patient Advocate Encounter   Received notification from CoverMyMeds that prior authorization for Ubrelvy  100MG  tablets is required/requested.   Insurance verification completed.   The patient is insured through [facility] .   Per test claim: The current 30 day co-pay is, $0.  No PA needed at this time. This test claim was processed through [facility]- copay amounts may vary at other pharmacies due to pharmacy/plan contracts, or as the patient moves through the different stages of their insurance plan.

## 2023-10-25 ENCOUNTER — Ambulatory Visit: Payer: 59 | Admitting: Family Medicine

## 2023-10-25 ENCOUNTER — Telehealth: Payer: Self-pay | Admitting: Family Medicine

## 2023-10-25 NOTE — Telephone Encounter (Signed)
 LVM and sent mychart msg informing pt of need to reschedule 11/25/23 appt - NP out

## 2023-10-28 ENCOUNTER — Other Ambulatory Visit (HOSPITAL_COMMUNITY)
Admission: RE | Admit: 2023-10-28 | Discharge: 2023-10-28 | Disposition: A | Discharge status: Home / Self Care | Source: Ambulatory Visit | Attending: Obstetrics and Gynecology | Admitting: Obstetrics and Gynecology

## 2023-10-28 ENCOUNTER — Encounter: Payer: Self-pay | Admitting: Obstetrics and Gynecology

## 2023-10-28 ENCOUNTER — Ambulatory Visit (INDEPENDENT_AMBULATORY_CARE_PROVIDER_SITE_OTHER): Admitting: Plastic Surgery

## 2023-10-28 ENCOUNTER — Encounter: Payer: Self-pay | Admitting: Plastic Surgery

## 2023-10-28 ENCOUNTER — Ambulatory Visit: Admitting: Obstetrics and Gynecology

## 2023-10-28 VITALS — BP 115/74 | HR 68 | Ht 66.0 in | Wt 166.6 lb

## 2023-10-28 VITALS — BP 111/70 | HR 64 | Ht 66.0 in | Wt 166.0 lb

## 2023-10-28 DIAGNOSIS — N912 Amenorrhea, unspecified: Secondary | ICD-10-CM

## 2023-10-28 DIAGNOSIS — N911 Secondary amenorrhea: Secondary | ICD-10-CM

## 2023-10-28 DIAGNOSIS — Z3202 Encounter for pregnancy test, result negative: Secondary | ICD-10-CM | POA: Diagnosis not present

## 2023-10-28 DIAGNOSIS — Z113 Encounter for screening for infections with a predominantly sexual mode of transmission: Secondary | ICD-10-CM | POA: Insufficient documentation

## 2023-10-28 DIAGNOSIS — M542 Cervicalgia: Secondary | ICD-10-CM | POA: Diagnosis not present

## 2023-10-28 DIAGNOSIS — Z01419 Encounter for gynecological examination (general) (routine) without abnormal findings: Secondary | ICD-10-CM | POA: Diagnosis not present

## 2023-10-28 DIAGNOSIS — Z1331 Encounter for screening for depression: Secondary | ICD-10-CM

## 2023-10-28 DIAGNOSIS — Z124 Encounter for screening for malignant neoplasm of cervix: Secondary | ICD-10-CM | POA: Diagnosis not present

## 2023-10-28 DIAGNOSIS — Z8742 Personal history of other diseases of the female genital tract: Secondary | ICD-10-CM

## 2023-10-28 DIAGNOSIS — N760 Acute vaginitis: Secondary | ICD-10-CM

## 2023-10-28 DIAGNOSIS — N62 Hypertrophy of breast: Secondary | ICD-10-CM

## 2023-10-28 DIAGNOSIS — M546 Pain in thoracic spine: Secondary | ICD-10-CM | POA: Diagnosis not present

## 2023-10-28 LAB — POCT URINE PREGNANCY: Preg Test, Ur: NEGATIVE

## 2023-10-28 NOTE — Progress Notes (Signed)
 28 y.o. New GYN presents for AEX/PAP.  C/o not having a period.  Pt recently stopped getting Depo Injections in April.  She did have irregular periods up to 2021.  Pt would like to conceive.

## 2023-10-28 NOTE — Progress Notes (Signed)
 NEW GYNECOLOGY VISIT Chief Complaint  Patient presents with   NEW PATIENT/GYN     Subjective:  Vicki Harper is a 28 y.o. G1P1001 who presents as a referral for secondary amenorrhea.  States she has been on depo since 2022 and has not been having periods on depo. Her last depo injection was March She reports that her PCP was concerned about her not having periods. Interested in pregnancy sometime in the future but does not have a current partner. No active plans.  She reports hx of recurrent vaginitis. Has been having some discharge today.  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   Gyn History: No LMP recorded. (Menstrual status: Other). Sexually active: yes/no: Yes Contraception: depo Last pap:  Lab Results  Component Value Date   DIAGPAP - Low grade squamous intraepithelial lesion (LSIL) (A) 09/12/2019  History of abnormal pap: Yes: LSIL 2021 Periods: no periods with depo     10/28/2023    8:34 AM 08/30/2020    9:48 AM 05/28/2020    4:01 PM 05/07/2020    9:53 AM  GAD 7 : Generalized Anxiety Score  Nervous, Anxious, on Edge 2  1 1   Control/stop worrying 1  1 2   Worry too much - different things 2  1 2   Trouble relaxing 1  1 1   Restless 0  0 0  Easily annoyed or irritable 3  2 2   Afraid - awful might happen 0  0 0  Total GAD 7 Score 9  6 8   Anxiety Difficulty Not difficult at all        Information is confidential and restricted. Go to Review Flowsheets to unlock data.       10/28/2023    8:33 AM 08/30/2020    9:44 AM 07/15/2020    9:26 AM 05/28/2020    3:59 PM 05/07/2020    9:52 AM  Depression screen PHQ 2/9  Decreased Interest 1   1 1   Down, Depressed, Hopeless 2   2 2   PHQ - 2 Score 3   3 3   Altered sleeping 2   1 1   Tired, decreased energy 2   1 2   Change in appetite 3   0 0  Feeling bad or failure about yourself  1   1 1   Trouble concentrating 0   0 0  Moving slowly or fidgety/restless 0   0 0  Suicidal thoughts 1   0 1  PHQ-9 Score 12    6 8   Difficult doing work/chores Not difficult at all         Information is confidential and restricted. Go to Review Flowsheets to unlock data.     OB History     Gravida  1   Para  1   Term  1   Preterm      AB      Living  1      SAB      IAB      Ectopic      Multiple  0   Live Births  1           Past Medical History:  Diagnosis Date   Anemia    Anxiety    Asthma    Back pain    Chronic kidney disease    Depression    Dyspnea    Headache    Hypothyroidism    Migraines    Vaginal Pap smear, abnormal  Past Surgical History:  Procedure Laterality Date   NO PAST SURGERIES      Social History   Socioeconomic History   Marital status: Single    Spouse name: Not on file   Number of children: 1   Years of education: Not on file   Highest education level: Not on file  Occupational History   Not on file  Tobacco Use   Smoking status: Never   Smokeless tobacco: Never  Vaping Use   Vaping status: Never Used  Substance and Sexual Activity   Alcohol use: No    Alcohol/week: 0.0 standard drinks of alcohol   Drug use: No   Sexual activity: Yes    Birth control/protection: None  Other Topics Concern   Not on file  Social History Narrative   Lives  baby girl.    Right handed   Caffeine : 1 cup/day   Social Drivers of Corporate investment banker Strain: Low Risk  (08/09/2023)   Received from [facility]   Overall Financial Resource Strain (CARDIA)    Difficulty of Paying Living Expenses: Not very hard  Food Insecurity: Patient Declined (08/09/2023)   Received from [facility]   Hunger Vital Sign    Worried About Running Out of Food in the Last Year: Patient declined    Ran Out of Food in the Last Year: Patient declined  Transportation Needs: No Transportation Needs (08/09/2023)   Received from [facility] - Transportation    Lack of Transportation (Medical): No    Lack of Transportation (Non-Medical): No  Physical  Activity: Unknown (08/09/2023)   Received from [facility]   Exercise Vital Sign    Days of Exercise per Week: 1 day    Minutes of Exercise per Session: Patient declined  Stress: Stress Concern Present (08/09/2023)   Received from [facility] of Occupational [facility] - Occupational Stress Questionnaire    Feeling of Stress : To some extent  Social Connections: Socially Isolated (08/09/2023)   Received from [facility]   Social Network    How would you rate your social network (family, work, friends)?: Little participation, lonely and socially isolated    Family History  Problem Relation Age of Onset   Migraines Mother    Asthma Brother    Cancer Maternal Aunt    Neuromuscular disorder Neg Hx    Autoimmune disease Neg Hx     Current Outpatient Medications on File Prior to Visit  Medication Sig Dispense Refill   tacrolimus (PROTOPIC) 0.1 % ointment Apply 1 Application topically daily.     Atogepant  (QULIPTA ) 60 MG TABS Take 1 tablet (60 mg total) by mouth daily. 90 tablet 3   EPINEPHrine  0.3 mg/0.3 mL IJ SOAJ injection SMARTSIG:IM As Directed PRN     FLUoxetine (PROZAC) 40 MG capsule Take 40 mg by mouth daily.     fluticasone -salmeterol (ADVAIR HFA) 230-21 MCG/ACT inhaler Inhale 2 puffs into the lungs 2 (two) times daily.     gabapentin  (NEURONTIN ) 300 MG capsule Take 1 capsule (300 mg total) by mouth at bedtime. (Patient not taking: Reported on 10/28/2023) 90 capsule 3   guanFACINE (INTUNIV) 2 MG TB24 ER tablet Take 2 mg by mouth at bedtime.     montelukast (SINGULAIR) 10 MG tablet Take 10 mg by mouth daily. (Patient not taking: Reported on 04/05/2023)     traZODone (DESYREL) 50 MG tablet Take 75 mg by mouth at bedtime as needed.  Ubrogepant  (UBRELVY ) 100 MG TABS Take 1 tablet (100 mg total) by mouth daily as needed. Take one tablet at onset of headache, may repeat 1 tablet in 2 hours, no more than 2 tablets in 24 hours (Patient not taking: Reported on 10/28/2023)  10 tablet 11   No current facility-administered medications on file prior to visit.    Allergies  Allergen Reactions   Penicillins      Objective:   Vitals:   10/28/23 0816  BP: 111/70  Pulse: 64  Weight: 166 lb (75.3 kg)  Height: 5\' 6"  (1.676 m)   Physical Examination:   General appearance - well appearing, and in no distress  Mental status - alert, oriented to person, place, and time  Psych:  normal mood and affect  Skin - warm and dry, normal color, no suspicious lesions noted  Chest - effort normal  Heart - normal rate   Neck:  midline trachea, no thyromegaly or nodules  Breasts - breasts appear normal, no suspicious masses, no skin or nipple changes or  axillary nodes  Abdomen - soft, nontender, nondistended, no masses or organomegaly  Pelvic -  VULVA: normal appearing vulva with no masses, tenderness or lesions   VAGINA: normal appearing vagina with normal color and discharge, no lesions  CERVIX: normal appearing cervix without discharge or lesions, no CMT  Thin prep pap is done with reflex HR HPV cotesting  UTERUS: uterus is felt to be normal size, shape, consistency and nontender   ADNEXA: No adnexal masses or tenderness noted.  Extremities:  No swelling or varicosities noted  Chaperone present for exam  Assessment and Plan:  1. Amenorrhea due to Depo Provera  Normal side effect of depo. Not of concern and ok to continue depo. Reviewed slower return to fertility after discontinuing depo and it may take time for her periods to return after discontinuing - POCT urine pregnancy  2. Recurrent vaginitis Swab done - Cervicovaginal ancillary only( [place])  3. Well woman exam with routine gynecological exam Pap today Normal breast exam, mammo at age 86 STI screening Ok to continue depo for contraception if desired  4. Cervical cancer screening (Primary) - Cytology - PAP( [place])  5. History of abnormal cervical Pap smear - Cytology - PAP( [facility])  6. Routine screening for STI (sexually transmitted infection) - Cervicovaginal ancillary only( [place]) - HIV antibody (with reflex) - Hepatitis C Antibody - Hepatitis B Surface AntiGEN - [facility]    No follow-ups on file.  Future Appointments  Date Time Provider Department Center  10/28/2023  1:00 PM Teretha Ferguson, MD [facility] None  11/04/2023  8:00 AM Terrilyn Fick, NP [facility] None    Marci Setter, MD, FACOG Obstetrician & Gynecologist, [facility] for [facility], [facility]

## 2023-10-28 NOTE — Progress Notes (Signed)
 Referring Provider Dianah Fort, PA [address] [place],  [place] [address]   CC:  Chief Complaint  Patient presents with   Advice Only      Vicki Harper is an 28 y.o. female.  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  She states that she has always had large breasts but they have become increasingly difficult to fit underneath her protective vest over the past couple of years.  She is interested in a bilateral breast reduction.  She has no personal history of breast disease but does have a family history of breast cancer.  She endorses grooves in her shoulders from her bra straps as well as ongoing rashes on the posterior aspect of the breast and between the breasts.  She states that she has seen a chiropractor in the past for her upper back and neck pain.  Allergies  Allergen Reactions   Penicillins Hives and Itching    Outpatient Encounter Medications as of 10/28/2023  Medication Sig   Atogepant  (QULIPTA ) 60 MG TABS Take 1 tablet (60 mg total) by mouth daily.   cetirizine (ZYRTEC) 10 MG tablet Take by mouth.   EMGALITY  120 MG/ML SOAJ SMARTSIG:120 Milligram(s) SUB-Q Once a Month   EPINEPHrine  0.3 mg/0.3 mL IJ SOAJ injection SMARTSIG:IM As Directed PRN   FLUoxetine (PROZAC) 40 MG capsule Take 40 mg by mouth daily.   fluticasone -salmeterol (ADVAIR) 100-50 MCG/ACT AEPB 1 puff 2 (two) times daily.   guanFACINE (INTUNIV) 2 MG TB24 ER tablet Take 2 mg by mouth at bedtime.   hydrOXYzine (ATARAX) 25 MG tablet Take 25 mg by mouth 3 (three) times daily.   medroxyPROGESTERone  Acetate 150 MG/ML SUSY INJECT 150 MG INTRAMUSCULARLY ONCE   predniSONE  (DELTASONE ) 20 MG tablet Take 20 mg by mouth daily.   tacrolimus (PROTOPIC) 0.1 % ointment Apply 1  Application topically daily.   traZODone (DESYREL) 100 MG tablet Take 100 mg by mouth at bedtime.   triamcinolone lotion (KENALOG) 0.1 % Apply 1 Application topically 3 (three) times daily.   [DISCONTINUED] fluticasone -salmeterol (ADVAIR HFA) 230-21 MCG/ACT inhaler Inhale 2 puffs into the lungs 2 (two) times daily.   [DISCONTINUED] traZODone (DESYREL) 50 MG tablet Take 75 mg by mouth at bedtime as needed.   albuterol  (VENTOLIN  HFA) 108 (90 Base) MCG/ACT inhaler 1 puff as needed Inhalation every 4-6 hrs, cough/wheeze/shortness of breath for 90 days   mupirocin ointment (BACTROBAN) 2 % 3 (three) times daily. (Patient not taking: Reported on 10/28/2023)   sertraline  (ZOLOFT ) 25 MG tablet Take 25 mg by mouth daily. (Patient not taking: Reported on 10/28/2023)   [DISCONTINUED] gabapentin  (NEURONTIN ) 300 MG capsule Take 1 capsule (300 mg total) by mouth at bedtime. (Patient not taking: Reported on 10/28/2023)   [DISCONTINUED] montelukast (SINGULAIR) 10 MG tablet Take 10 mg by mouth daily. (Patient not taking: Reported on 10/28/2023)   [DISCONTINUED] Ubrogepant  (UBRELVY ) 100 MG TABS Take 1 tablet (100 mg total) by mouth daily as needed. Take one tablet at onset of headache, may repeat 1 tablet in 2 hours, no more than 2 tablets in 24 hours (Patient not taking: Reported on 10/28/2023)   No facility-administered encounter medications on file as of 10/28/2023.     Past Medical History:  Diagnosis Date   Anemia  Anxiety    Asthma    Back pain    Chronic kidney disease    Depression    Dyspnea    Headache    Hypothyroidism    Migraines    Vaginal Pap smear, abnormal     Past Surgical History:  Procedure Laterality Date   NO PAST SURGERIES      Family History  Problem Relation Age of Onset   Migraines Mother    Asthma Brother    Cancer Maternal Aunt    Neuromuscular disorder Neg Hx    Autoimmune disease Neg Hx     Social History   Social History Narrative   Lives  baby girl.    Right  handed   Caffeine : 1 cup/day     Review of Systems General: Denies fevers, chills, weight loss CV: Denies chest pain, shortness of breath, palpitations Breast: Large breasts which interfere with daily activities, proper wearing of protective gear, and upper back and neck pain.  Physical Exam    10/28/2023    1:07 PM 10/28/2023    8:16 AM 05/11/2023    6:46 PM  Vitals with BMI  Height 5\' 6"  5\' 6"    Weight 166 lbs 10 oz 166 lbs 171 lbs  BMI 26.9 26.81   Systolic 115 111   Diastolic 74 70   Pulse 68 64     General:  No acute distress,  Alert and oriented, Non-Toxic, Normal speech and affect Breast: Patient has moderately large breasts with grade 3 ptosis.  There are no dominant masses on physical exam.  The nipples are normal in appearance without evidence of nipple discharge.  The sternal notch nipple distance on the right is 29 cm and 30 cm on the left the fold to nipple distance on the right is 14 cm and 16 cm on the left Mammogram: Not applicable due to age Assessment/Plan Macromastia: Patient has persistent upper back and neck pain.  She has been seen in the past for the same problem.  She would like to have a breast reduction I believe that I can remove 400 g per breast.  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  She finds that the protective vest that she wears often fits either inappropriately or uncomfortably due to the size of her breast.  We discussed breast reductions at length including the location of the incision and the unpredictable nature of scarring and wound healing.  We discussed risk of bleeding, infection, and seroma formation.  She understands that I may use drains postoperatively to help decrease the risk of seroma formation.  We discussed the risks of nipple loss due to nipple ischemia as well as the  possibility that she may not be able to successfully breast-feed after breast reduction.  We discussed the postoperative limitations which include no heavy lifting greater than 20 pounds, no vigorous activity, no submerging incisions in water for 6 weeks.  She may return to light activity as tolerated.  She will be encouraged to begin ambulation immediately after surgery to help decrease the risk of DVT.  All questions were answered to her satisfaction.  Photographs were obtained today with her consent.  Will schedule her for a breast reduction at her request.  Teretha Ferguson 10/28/2023, 3:08 PM

## 2023-10-29 ENCOUNTER — Ambulatory Visit: Payer: Self-pay | Admitting: Obstetrics and Gynecology

## 2023-10-29 LAB — CERVICOVAGINAL ANCILLARY ONLY
Bacterial Vaginitis (gardnerella): NEGATIVE
Candida Glabrata: NEGATIVE
Candida Vaginitis: NEGATIVE
Chlamydia: NEGATIVE
Comment: NEGATIVE
Comment: NEGATIVE
Comment: NEGATIVE
Comment: NEGATIVE
Comment: NEGATIVE
Comment: NORMAL
Neisseria Gonorrhea: NEGATIVE
Trichomonas: NEGATIVE

## 2023-10-29 LAB — HEPATITIS B SURFACE ANTIGEN: Hepatitis B Surface Ag: NEGATIVE

## 2023-10-29 LAB — HEPATITIS C ANTIBODY: Hep C Virus Ab: NONREACTIVE

## 2023-10-29 LAB — HIV ANTIBODY (ROUTINE TESTING W REFLEX): HIV Screen 4th Generation wRfx: NONREACTIVE

## 2023-10-29 LAB — RPR: RPR Ser Ql: NONREACTIVE
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
## 2023-11-03 NOTE — Patient Instructions (Signed)
 Below is our plan:  We will continue Qulipta  60mg  daily and Ubrelvy  as needed. Please take 1 tablet at onset of headache. May take 1 additional tablet in 2 hours if needed. Do not take more than 2 tablets in 24 hours or more than 10 in a month.   Please make sure you are staying well hydrated. I recommend 50-60 ounces daily. Well balanced diet and regular exercise encouraged. Consistent sleep schedule with 6-8 hours recommended.   Please continue follow up with care team as directed.   Follow up with me in 1 year   You may receive a survey regarding today's visit. I encourage you to leave honest feed back as I do use this information to improve patient care. Thank you for seeing me today!   GENERAL HEADACHE INFORMATION:   Natural supplements: Magnesium Oxide or Magnesium Glycinate 500 mg at bed (up to 800 mg daily) Coenzyme Q10 300 mg in AM Vitamin B2- 200 mg twice a day   Add 1 supplement at a time since even natural supplements can have undesirable side effects. You can sometimes buy supplements cheaper (especially Coenzyme Q10) at www.WebmailGuide.co.za or at [facility].  Migraine with aura: There is increased risk for stroke in women with migraine with aura and a contraindication for the combined contraceptive pill for use by women who have migraine with aura. The risk for women with migraine without aura is lower. However other risk factors like smoking are far more likely to increase stroke risk than migraine. There is a recommendation for no smoking and for the use of OCPs without estrogen such as progestogen only pills particularly for women with migraine with aura.Aaron Aas People who have migraine headaches with auras may be 3 times more likely to have a stroke caused by a blood clot, compared to migraine patients who don't see auras. Women who take hormone-replacement therapy may be 30 percent more likely to suffer a clot-based stroke than women not taking medication containing estrogen. Other risk  factors like smoking and high blood pressure may be  much more important.    Vitamins and herbs that show potential:   Magnesium: Magnesium (250 mg twice a day or 500 mg at bed) has a relaxant effect on smooth muscles such as blood vessels. Individuals suffering from frequent or daily headache usually have low magnesium levels which can be increase with daily supplementation of 400-750 mg. Three trials found 40-90% average headache reduction  when used as a preventative. Magnesium may help with headaches are aura, the best evidence for magnesium is for migraine with aura is its thought to stop the cortical spreading depression we believe is the pathophysiology of migraine aura.Magnesium also demonstrated the benefit in menstrually related migraine.  Magnesium is part of the messenger system in the serotonin cascade and it is a good muscle relaxant.  It is also useful for constipation which can be a side effect of other medications used to treat migraine. Good sources include nuts, whole grains, and tomatoes. Side Effects: loose stool/diarrhea  Riboflavin (vitamin B 2) 200 mg twice a day. This vitamin assists nerve cells in the production of ATP a principal energy storing molecule.  It is necessary for many chemical reactions in the body.  There have been at least 3 clinical trials of riboflavin using 400 mg per day all of which suggested that migraine frequency can be decreased.  All 3 trials showed significant improvement in over half of migraine sufferers.  The supplement is found in bread, cereal,  milk, meat, and poultry.  Most Americans get more riboflavin than the recommended daily allowance, however riboflavin deficiency is not necessary for the supplements to help prevent headache. Side effects: energizing, green urine   Coenzyme Q10: This is present in almost all cells in the body and is critical component for the conversion of energy.  Recent studies have shown that a nutritional supplement of CoQ10  can reduce the frequency of migraine attacks by improving the energy production of cells as with riboflavin.  Doses of 150 mg twice a day have been shown to be effective.   Melatonin: Increasing evidence shows correlation between melatonin secretion and headache conditions.  Melatonin supplementation has decreased headache intensity and duration.  It is widely used as a sleep aid.  Sleep is natures way of dealing with migraine.  A dose of 3 mg is recommended to start for headaches including cluster headache. Higher doses up to 15 mg has been reviewed for use in Cluster headache and have been used. The rationale behind using melatonin for cluster is that many theories regarding the cause of Cluster headache center around the disruption of the normal circadian rhythm in the brain.  This helps restore the normal circadian rhythm.   HEADACHE DIET: Foods and beverages which may trigger migraine Note that only 20% of headache patients are food sensitive. You will know if you are food sensitive if you get a headache consistently 20 minutes to 2 hours after eating a certain food. Only cut out a food if it causes headaches, otherwise you might remove foods you enjoy! What matters most for diet is to eat a well balanced healthy diet full of vegetables and low fat protein, and to not miss meals.   Chocolate, other sweets ALL cheeses except cottage and cream cheese Dairy products, yogurt, sour cream, ice cream Liver Meat extracts (Bovril, Marmite, meat tenderizers) Meats or fish which have undergone aging, fermenting, pickling or smoking. These include: Hotdogs,salami,Lox,sausage, mortadellas,smoked salmon, pepperoni, Pickled herring Pods of broad bean (English beans, Chinese pea pods, [place] (fava) beans, lima and navy beans Ripe avocado, ripe banana Yeast extracts or active yeast preparations such as Brewer's or Fleishman's (commercial bakes goods are permitted) Tomato based foods, pizza (lasagna, etc.)    MSG (monosodium glutamate) is disguised as many things; look for these common aliases: Monopotassium glutamate Autolysed yeast Hydrolysed protein Sodium caseinate "flavorings" "all natural preservatives" Nutrasweet   Avoid all other foods that convincingly provoke headaches.   Resources: The Dizzy Althia Jetty Your Headache Diet, migrainestrong.com  https://zamora-andrews.com/   Caffeine  and Migraine For patients that have migraine, caffeine  intake more than 3 days per week can lead to dependency and increased migraine frequency. I would recommend cutting back on your caffeine  intake as best you can. The recommended amount of caffeine  is 200-300 mg daily, although migraine patients may experience dependency at even lower doses. While you may notice an increase in headache temporarily, cutting back will be helpful for headaches in the long run. For more information on caffeine  and migraine, visit: https://americanmigrainefoundation.org/resource-library/caffeine -and-migraine/   Headache Prevention Strategies:   1. Maintain a headache diary; learn to identify and avoid triggers.  - This can be a simple note where you log when you had a headache, associated symptoms, and medications used - There are several smartphone apps developed to help track migraines: Migraine Buddy, Migraine Monitor, Curelator N1-Headache App   Common triggers include: Emotional triggers: Emotional/Upset family or friends Emotional/Upset occupation Business reversal/success Anticipation anxiety Crisis-serious Post-crisis periodNew job/position  Physical triggers: Vacation Day Weekend Strenuous Exercise High Altitude Location New Move Menstrual Day Physical Illness Oversleep/Not enough sleep Weather changes Light: Photophobia or light sesnitivity treatment involves a balance between desensitization and reduction in overly strong input. Use dark polarized  glasses outside, but not inside. Avoid bright or fluorescent light, but do not dim environment to the point that going into a normally lit room hurts. Consider FL-41 tint lenses, which reduce the most irritating wavelengths without blocking too much light.  These can be obtained at axonoptics.com or theraspecs.com Foods: see list above.   2. Limit use of acute treatments (over-the-counter medications, triptans, etc.) to no more than 2 days per week or 10 days per month to prevent medication overuse headache (rebound headache).     3. Follow a regular schedule (including weekends and holidays): Don't skip meals. Eat a balanced diet. 8 hours of sleep nightly. Minimize stress. Exercise 30 minutes per day. Being overweight is associated with a 5 times increased risk of chronic migraine. Keep well hydrated and drink 6-8 glasses of water per day.   4. Initiate non-pharmacologic measures at the earliest onset of your headache. Rest and quiet environment. Relax and reduce stress. Breathe2Relax is a free app that can instruct you on    some simple relaxtion and breathing techniques. Http://Dawnbuse.com is a    free website that provides teaching videos on relaxation.  Also, there are  many apps that   can be downloaded for "mindful" relaxation.  An app called YOGA NIDRA will help walk you through mindfulness. Another app called Calm can be downloaded to give you a structured mindfulness guide with daily reminders and skill development. Headspace for guided meditation Mindfulness Based Stress Reduction Online Course: www.palousemindfulness.com Cold compresses.   5. Don't wait!! Take the maximum allowable dosage of prescribed medication at the first sign of migraine.   6. Compliance:  Take prescribed medication regularly as directed and at the first sign of a migraine.   7. Communicate:  Call your physician when problems arise, especially if your headaches change, increase in frequency/severity, or become  associated with neurological symptoms (weakness, numbness, slurred speech, etc.). Proceed to emergency room if you experience new or worsening symptoms or symptoms do not resolve, if you have new neurologic symptoms or if headache is severe, or for any concerning symptom.   8. Headache/pain management therapies: Consider various complementary methods, including medication, behavioral therapy, psychological counselling, biofeedback, massage therapy, acupuncture, dry needling, and other modalities.  Such measures may reduce the need for medications. Counseling for pain management, where patients learn to function and ignore/minimize their pain, seems to work very well.   9. Recommend changing family's attention and focus away from patient's headaches. Instead, emphasize daily activities. If first question of day is 'How are your headaches/Do you have a headache today?', then patient will constantly think about headaches, thus making them worse. Goal is to re-direct attention away from headaches, toward daily activities and other distractions.   10. Helpful Websites: www.AmericanHeadacheSociety.org PatentHood.ch www.headaches.org TightMarket.nl www.achenet.org

## 2023-11-04 ENCOUNTER — Encounter: Payer: Self-pay | Admitting: Family Medicine

## 2023-11-04 ENCOUNTER — Ambulatory Visit: Admitting: Family Medicine

## 2023-11-04 VITALS — BP 103/67 | HR 66 | Ht 66.0 in | Wt 171.5 lb

## 2023-11-04 DIAGNOSIS — G43009 Migraine without aura, not intractable, without status migrainosus: Secondary | ICD-10-CM

## 2023-11-04 MED ORDER — QULIPTA 60 MG PO TABS: 60.0000 mg | ORAL_TABLET | Freq: Every day | ORAL | 3 refills | Status: AC

## 2023-11-04 MED ORDER — UBRELVY 100 MG PO TABS: 100.0000 mg | ORAL_TABLET | Freq: Every day | ORAL | 11 refills | Status: DC | PRN

## 2023-11-05 LAB — CYTOLOGY - PAP
Comment: NEGATIVE
Comment: NEGATIVE
Comment: NEGATIVE
Diagnosis: UNDETERMINED — AB
HPV 16: NEGATIVE
HPV 18 / 45: NEGATIVE
High risk HPV: POSITIVE — AB

## 2023-11-25 ENCOUNTER — Ambulatory Visit: Admitting: Family Medicine

## 2023-12-03 ENCOUNTER — Ambulatory Visit: Admitting: Obstetrics and Gynecology

## 2023-12-03 ENCOUNTER — Other Ambulatory Visit (HOSPITAL_COMMUNITY)
Admission: RE | Admit: 2023-12-03 | Discharge: 2023-12-03 | Disposition: A | Discharge status: Home / Self Care | Source: Ambulatory Visit | Attending: Obstetrics and Gynecology | Admitting: Obstetrics and Gynecology

## 2023-12-03 VITALS — BP 106/68 | HR 60 | Wt 160.0 lb

## 2023-12-03 DIAGNOSIS — R87610 Atypical squamous cells of undetermined significance on cytologic smear of cervix (ASC-US): Secondary | ICD-10-CM | POA: Diagnosis not present

## 2023-12-03 DIAGNOSIS — R87810 Cervical high risk human papillomavirus (HPV) DNA test positive: Secondary | ICD-10-CM

## 2023-12-03 NOTE — Progress Notes (Signed)
    GYNECOLOGY OFFICE COLPOSCOPY PROCEDURE NOTE  28 y.o. G1P1001 here for colposcopy for ASCUS with POSITIVE high risk HPV pap smear on 10/28/23.   Pregnancy test:  negative Gardasil:  Unsure - will check with family. Discussed role for HPV in cervical dysplasia, need for surveillance.  Informed consent and review of risks, benefit and alternatives performed. Written consent given.   Speculum inserted into patient's vagina assuring full view of cervix and vaginal walls. 3 swabs of vinegar solution applied to the cervix and vaginal walls and colposcope was used to observe both the cervix and vaginal walls.   Colposcopy adequate? Yes Cervix with slightly irregular appearance - either Nabothian cyst around 9-12 o;clock or potential cervical trauma from 12-3 o'clock. Has thin AWE from 4-8 o'clock no mosaicism or punctation. Branching vessel at 4 o'clock. 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 Predict CINI  All specimens were labeled and sent to pathology.  Speculum removed.  Pt tolerated well with minimal pain and bleeding.   Patient was given post procedure instructions.  Will follow up pathology and manage accordingly; patient will be contacted with results and recommendations.  Routine preventative health maintenance measures emphasized.  Kieth Carolin, MD Obstetrician & Gynecologist, [facility] for [employer], [facility]

## 2023-12-03 NOTE — Patient Instructions (Addendum)
 It is normal to have cramping and bleeding for the next 3-5 days. You should feel better every day.   Please call us  if you have any severe pain, bleeding that soaks more than 1 pad in a hour, have fevers, or feel like you're going to pass out.   You can take tylenol  1000mg  and ibuprofen  600mg  every 8 hours as needed for pain. It is ok to take both at the same time.    Please let us  know if you would like to start the HPV vaccine

## 2023-12-06 ENCOUNTER — Telehealth: Payer: Self-pay | Admitting: Plastic Surgery

## 2023-12-06 ENCOUNTER — Ambulatory Visit: Payer: Self-pay | Admitting: Obstetrics and Gynecology

## 2023-12-06 LAB — SURGICAL PATHOLOGY

## 2023-12-06 NOTE — Telephone Encounter (Signed)
 I spoke with the patient and all of her questions were answered to her satisfaction. The plan for now will be for patient to go back to work light duty 2 weeks postop. Patient understands she will have restrictions for 6 weeks postop.

## 2023-12-06 NOTE — Telephone Encounter (Signed)
 Pt called wanting to know how long she will be out of work so she can work it out with her job. I did go over when she is sched to have her sx and they the provider will go over all that at her preop in Aug. But the patient wants a provider or the sx sch to call her back still. She stated she wants to know all this information now.

## 2023-12-13 ENCOUNTER — Encounter: Payer: Self-pay | Admitting: Obstetrics and Gynecology

## 2023-12-13 NOTE — Telephone Encounter (Signed)
 Called patient to review results. 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 We also reviewed that if there are concerns about carrying future pregnancies we could plan for colpo q71mo. She would prefer treatment with LEEP. Reviewed r/b of LEEP, procedure, option for office or OR, and anticipated recovery. She would like to proceed with LEEP in the office. Message routed for scheduling  Kieth Carolin, MD Obstetrician & Gynecologist, [facility] for [facility], [facility]

## 2023-12-14 ENCOUNTER — Telehealth: Payer: Self-pay | Admitting: *Deleted

## 2023-12-14 NOTE — Telephone Encounter (Signed)
 Left patient an urgent message to call and schedule Leep with Dr. Erik on 01/12/2024 at [facility] per patient and Dr. Erik request. Appointment with [facility] will need to be cancelled after patient has scheduled with [facility].

## 2023-12-29 ENCOUNTER — Other Ambulatory Visit: Payer: Self-pay | Admitting: Medical Genetics

## 2024-01-03 ENCOUNTER — Encounter: Admitting: Obstetrics and Gynecology

## 2024-01-10 ENCOUNTER — Encounter: Payer: Self-pay | Admitting: Student

## 2024-01-10 ENCOUNTER — Ambulatory Visit (INDEPENDENT_AMBULATORY_CARE_PROVIDER_SITE_OTHER): Admitting: Student

## 2024-01-10 VITALS — BP 112/74 | HR 69 | Ht 66.0 in | Wt 159.4 lb

## 2024-01-10 DIAGNOSIS — N62 Hypertrophy of breast: Secondary | ICD-10-CM

## 2024-01-10 MED ORDER — ONDANSETRON HCL 4 MG PO TABS: 4.0000 mg | ORAL_TABLET | Freq: Three times a day (TID) | ORAL | 0 refills | Status: DC | PRN

## 2024-01-10 MED ORDER — OXYCODONE HCL 5 MG PO TABS: 5.0000 mg | ORAL_TABLET | Freq: Four times a day (QID) | ORAL | 0 refills | Status: DC | PRN

## 2024-01-10 NOTE — Progress Notes (Signed)
 Patient ID: Vicki Harper, female    DOB: [birth year], 28 y.o.   MRN: [id number]  Chief Complaint  Patient presents with   Pre-op Exam      ICD-10-CM   1. Macromastia  N62        History of Present Illness: Vicki Harper is a 28 y.o.  female  with a history of macromastia.  She presents for preoperative evaluation for upcoming procedure, Bilateral Breast Reduction, scheduled for 02/04/24 with Dr.  Waddell  Patient's mother is present at bedside.  The patient has not had problems with anesthesia.  Patient states that she has never needed a mammogram before.  She states that she had a great aunt with breast cancer.  She denies any personal history of breast cancer.  Patient denies any history of cardiac disease.  She denies taking any blood thinners.  Patient reports she is not a smoker.  Patient states that she receives a hormone injection for birth control every 3 months.  She denies any history of miscarriages.  She denies any personal or family history of blood clots or clotting diseases.  Patient denies any recent surgeries, traumas or infections.  She denies any history of stroke or heart attack.  She denies history of Crohn's disease or ulcerative colitis.  She does reports she has asthma which is well-controlled.  She denies any history of cancer.  She denies any varicosities to her lower extremities.  She denies any recent fevers, chills or changes in her health.  Summary of Previous Visit: Patient was seen by Dr. Waddell on 10/29/2023.  At this visit, patient reported upper back and neck pain due to her enlarged breasts.  On exam, STN on the right was 29 cm and STN on the left was 30 cm.  It was estimated that 400 g could be removed from each breast.  Estimated excess breast tissue to be removed at time of surgery: 400 grams  Job: Works in Designer, jewellery, planning to take 2 weeks off.  She understands that she will have restrictions for 6 weeks  postoperatively.  PMH Significant for: Migraine, MDD, macromastia   Past Medical History: Allergies: Allergies  Allergen Reactions   Penicillins Hives and Itching    Current Medications:  Current Outpatient Medications:    albuterol  (VENTOLIN  HFA) 108 (90 Base) MCG/ACT inhaler, 1 puff as needed Inhalation every 4-6 hrs, cough/wheeze/shortness of breath for 90 days, Disp: , Rfl:    Atogepant  (QULIPTA ) 60 MG TABS, Take 1 tablet (60 mg total) by mouth daily., Disp: 90 tablet, Rfl: 3   cetirizine (ZYRTEC) 10 MG tablet, Take by mouth., Disp: , Rfl:    EPINEPHrine  0.3 mg/0.3 mL IJ SOAJ injection, SMARTSIG:IM As Directed PRN, Disp: , Rfl:    FLUoxetine (PROZAC) 40 MG capsule, Take 40 mg by mouth daily., Disp: , Rfl:    fluticasone -salmeterol (ADVAIR) 100-50 MCG/ACT AEPB, 1 puff 2 (two) times daily., Disp: , Rfl:    glycopyrrolate  (ROBINUL ) 1 MG tablet, Take 1 mg by mouth 3 (three) times daily., Disp: , Rfl:    guanFACINE (INTUNIV) 2 MG TB24 ER tablet, Take 2 mg by mouth at bedtime., Disp: , Rfl:    medroxyPROGESTERone  Acetate 150 MG/ML SUSY, INJECT 150 MG INTRAMUSCULARLY ONCE, Disp: , Rfl:    nifedipine 0.3 % ointment, apply TO anus FOUR TIMES DAILY FOR ONE MONTH, Disp: , Rfl:    tacrolimus (PROTOPIC) 0.1 % ointment, Apply 1 Application topically daily., Disp: , Rfl:  tiZANidine (ZANAFLEX) 2 MG tablet, Take 2-4 mg by mouth at bedtime., Disp: , Rfl:    traZODone (DESYREL) 100 MG tablet, Take 100 mg by mouth at bedtime., Disp: , Rfl:    Ubrogepant  (UBRELVY ) 100 MG TABS, Take 1 tablet (100 mg total) by mouth daily as needed., Disp: 10 tablet, Rfl: 11  Past Medical Problems: Past Medical History:  Diagnosis Date   Anemia    Anxiety    Asthma    Back pain    Chronic kidney disease    Depression    Dyspnea    Headache    Hypothyroidism    Migraines    Vaginal Pap smear, abnormal     Past Surgical History: Past Surgical History:  Procedure Laterality Date   NO PAST SURGERIES       Social History: Social History   Socioeconomic History   Marital status: Single    Spouse name: Not on file   Number of children: 1   Years of education: Not on file   Highest education level: Not on file  Occupational History   Not on file  Tobacco Use   Smoking status: Never   Smokeless tobacco: Never  Vaping Use   Vaping status: Never Used  Substance and Sexual Activity   Alcohol use: No    Alcohol/week: 0.0 standard drinks of alcohol   Drug use: No   Sexual activity: Yes    Birth control/protection: None  Other Topics Concern   Not on file  Social History Narrative   Lives  baby 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    Right handed   Caffeine : 1 cup/day   Social Drivers of Corporate investment banker Strain: Low Risk  (08/09/2023)   Received from [facility]   Overall Financial Resource Strain (CARDIA)    Difficulty of Paying Living Expenses: Not very hard  Food Insecurity: Patient Declined (08/09/2023)   Received from [facility]   Hunger Vital Sign    Within the past 12 months, you worried that your food would run out before you got the money to buy more.: Patient declined    Within the past 12 months, the food you bought just didn't last and you didn't have money to get more.: Patient declined  Transportation Needs: No Transportation Needs (08/09/2023)   Received from [facility] - Transportation    Lack of Transportation (Medical): No    Lack of Transportation (Non-Medical): No  Physical Activity: Unknown (08/09/2023)   Received from [facility]   Exercise Vital Sign    On average, how many days per week do you engage in moderate to strenuous exercise (like a brisk walk)?: 1 day    On average, how many minutes do you engage in exercise at this level?: Patient declined  Stress: Stress Concern Present (08/09/2023)   Received from [facility] - Occupational Stress Questionnaire    Feeling of Stress : To some extent  Social  Connections: Socially Isolated (08/09/2023)   Received from [facility]   Social Network    How would you rate your social network (family, work, friends)?: Little participation, lonely and socially isolated  Intimate Partner Violence: Not At Risk (08/09/2023)   Received from [facility]   HITS    Over the last 12 months how often did your partner physically hurt you?: Never    Over the last 12 months how often did your partner insult you or talk down to you?:  Never    Over the last 12 months how often did your partner threaten you with physical harm?: Never    Over the last 12 months how often did your partner scream or curse at you?: Never    Family History: Family History  Problem Relation Age of Onset   Migraines Mother    Asthma Brother    Cancer Maternal Aunt    Neuromuscular disorder Neg Hx    Autoimmune disease Neg Hx     Review of Systems: Denies any recent fevers, chills or changes in health  Physical Exam: Vital Signs BP 112/74 (BP Location: Left Arm, Patient Position: Sitting, Cuff Size: Normal)   Pulse 69   Ht 5' 6 (1.676 m)   Wt 159 lb 6.4 oz (72.3 kg)   SpO2 97%   BMI 25.73 kg/m   Physical Exam  Constitutional:      General: Not in acute distress.    Appearance: Normal appearance. Not ill-appearing.  HENT:     Head: Normocephalic and atraumatic.  Neck:     Musculoskeletal: Normal range of motion.  Cardiovascular:     Rate and Rhythm: Normal rate Pulmonary:     Effort: Pulmonary effort is normal. No respiratory distress.  Musculoskeletal: Normal range of motion.  Skin:    General: Skin is warm and dry.     Findings: No erythema or rash.  Neurological:     Mental Status: Alert and oriented to person, place, and time. Mental status is at baseline.  Psychiatric:        Mood and Affect: Mood normal.        Behavior: Behavior normal.    Assessment/Plan: The patient is scheduled for bilateral breast reduction with Dr. Waddell.  Risks, benefits, and  alternatives of procedure discussed, questions answered and consent obtained.    Smoking Status: Nonsmoker; Counseling Given? NA Last Mammogram: NA due to age  Caprini Score: 3; Risk Factors include: Age, BMI > 25, and length of planned surgery. Recommendation for mechanical prophylaxis. Encourage early ambulation.   Pictures obtained: @consult   Post-op Rx sent to pharmacy: Oxycodone , Zofran   Instructed patient to hold her guanfacine and hold her Qulipta  as well as her Ubrelvy  the day of surgery.  Patient expressed understanding.  Patient was provided with the breast reduction and General Surgical Risk consent document and Pain Medication Agreement prior to their appointment.  They had adequate time to read through the risk consent documents and Pain Medication Agreement. We also discussed them in person together during this preop appointment. All of their questions were answered to their satisfaction.  Recommended calling if they have any further questions.  Risk consent form and Pain Medication Agreement to be scanned into patient's chart.  The risk that can be encountered with breast reduction were discussed and include the following but not limited to these:  Breast asymmetry, fluid accumulation, firmness of the breast, inability to breast feed, loss of nipple or areola, skin loss, decrease or no nipple sensation, fat necrosis of the breast tissue, bleeding, infection, healing delay.  There are risks of anesthesia, changes to skin sensation and injury to nerves or blood vessels.  The muscle can be temporarily or permanently injured.  You may have an allergic reaction to tape, suture, glue, blood products which can result in skin discoloration, swelling, pain, skin lesions, poor healing.  Any of these can lead to the need for revisonal surgery or stage procedures.  A reduction has potential to interfere with diagnostic procedures.  Nipple or breast piercing can increase risks of infection.  This  procedure is best done when the breast is fully developed.  Changes in the breast will continue to occur over time.  Pregnancy can alter the outcomes of previous breast reduction surgery, weight gain and weigh loss can also effect the long term appearance.     Electronically signed by: Estefana FORBES Peck, PA-C 01/10/2024 11:47 AM

## 2024-01-10 NOTE — H&P (View-Only) (Signed)
 Patient ID: Vicki Harper, female    DOB: [birth year], 28 y.o.   MRN: [id number]  Chief Complaint  Patient presents with   Pre-op Exam      ICD-10-CM   1. Macromastia  N62        History of Present Illness: Vicki Harper is a 28 y.o.  female  with a history of macromastia.  She presents for preoperative evaluation for upcoming procedure, Bilateral Breast Reduction, scheduled for 02/04/24 with Dr.  Waddell  Patient's mother is present at bedside.  The patient has not had problems with anesthesia.  Patient states that she has never needed a mammogram before.  She states that she had a great aunt with breast cancer.  She denies any personal history of breast cancer.  Patient denies any history of cardiac disease.  She denies taking any blood thinners.  Patient reports she is not a smoker.  Patient states that she receives a hormone injection for birth control every 3 months.  She denies any history of miscarriages.  She denies any personal or family history of blood clots or clotting diseases.  Patient denies any recent surgeries, traumas or infections.  She denies any history of stroke or heart attack.  She denies history of Crohn's disease or ulcerative colitis.  She does reports she has asthma which is well-controlled.  She denies any history of cancer.  She denies any varicosities to her lower extremities.  She denies any recent fevers, chills or changes in her health.  Summary of Previous Visit: Patient was seen by Dr. Waddell on 10/29/2023.  At this visit, patient reported upper back and neck pain due to her enlarged breasts.  On exam, STN on the right was 29 cm and STN on the left was 30 cm.  It was estimated that 400 g could be removed from each breast.  Estimated excess breast tissue to be removed at time of surgery: 400 grams  Job: Works in Designer, jewellery, planning to take 2 weeks off.  She understands that she will have restrictions for 6 weeks  postoperatively.  PMH Significant for: Migraine, MDD, macromastia   Past Medical History: Allergies: Allergies  Allergen Reactions   Penicillins Hives and Itching    Current Medications:  Current Outpatient Medications:    albuterol  (VENTOLIN  HFA) 108 (90 Base) MCG/ACT inhaler, 1 puff as needed Inhalation every 4-6 hrs, cough/wheeze/shortness of breath for 90 days, Disp: , Rfl:    Atogepant  (QULIPTA ) 60 MG TABS, Take 1 tablet (60 mg total) by mouth daily., Disp: 90 tablet, Rfl: 3   cetirizine (ZYRTEC) 10 MG tablet, Take by mouth., Disp: , Rfl:    EPINEPHrine  0.3 mg/0.3 mL IJ SOAJ injection, SMARTSIG:IM As Directed PRN, Disp: , Rfl:    FLUoxetine (PROZAC) 40 MG capsule, Take 40 mg by mouth daily., Disp: , Rfl:    fluticasone -salmeterol (ADVAIR) 100-50 MCG/ACT AEPB, 1 puff 2 (two) times daily., Disp: , Rfl:    glycopyrrolate  (ROBINUL ) 1 MG tablet, Take 1 mg by mouth 3 (three) times daily., Disp: , Rfl:    guanFACINE (INTUNIV) 2 MG TB24 ER tablet, Take 2 mg by mouth at bedtime., Disp: , Rfl:    medroxyPROGESTERone  Acetate 150 MG/ML SUSY, INJECT 150 MG INTRAMUSCULARLY ONCE, Disp: , Rfl:    nifedipine 0.3 % ointment, apply TO anus FOUR TIMES DAILY FOR ONE MONTH, Disp: , Rfl:    tacrolimus (PROTOPIC) 0.1 % ointment, Apply 1 Application topically daily., Disp: , Rfl:  tiZANidine (ZANAFLEX) 2 MG tablet, Take 2-4 mg by mouth at bedtime., Disp: , Rfl:    traZODone (DESYREL) 100 MG tablet, Take 100 mg by mouth at bedtime., Disp: , Rfl:    Ubrogepant  (UBRELVY ) 100 MG TABS, Take 1 tablet (100 mg total) by mouth daily as needed., Disp: 10 tablet, Rfl: 11  Past Medical Problems: Past Medical History:  Diagnosis Date   Anemia    Anxiety    Asthma    Back pain    Chronic kidney disease    Depression    Dyspnea    Headache    Hypothyroidism    Migraines    Vaginal Pap smear, abnormal     Past Surgical History: Past Surgical History:  Procedure Laterality Date   NO PAST SURGERIES       Social History: Social History   Socioeconomic History   Marital status: Single    Spouse name: Not on file   Number of children: 1   Years of education: Not on file   Highest education level: Not on file  Occupational History   Not on file  Tobacco Use   Smoking status: Never   Smokeless tobacco: Never  Vaping Use   Vaping status: Never Used  Substance and Sexual Activity   Alcohol use: No    Alcohol/week: 0.0 standard drinks of alcohol   Drug use: No   Sexual activity: Yes    Birth control/protection: None  Other Topics Concern   Not on file  Social History Narrative   Lives  baby girl.    Right handed   Caffeine : 1 cup/day   Social Drivers of Corporate investment banker Strain: Low Risk  (08/09/2023)   Received from [facility]   Overall Financial Resource Strain (CARDIA)    Difficulty of Paying Living Expenses: Not very hard  Food Insecurity: Patient Declined (08/09/2023)   Received from [facility]   Hunger Vital Sign    Within the past 12 months, you worried that your food would run out before you got the money to buy more.: Patient declined    Within the past 12 months, the food you bought just didn't last and you didn't have money to get more.: Patient declined  Transportation Needs: No Transportation Needs (08/09/2023)   Received from [facility] - Transportation    Lack of Transportation (Medical): No    Lack of Transportation (Non-Medical): No  Physical Activity: Unknown (08/09/2023)   Received from [facility]   Exercise Vital Sign    On average, how many days per week do you engage in moderate to strenuous exercise (like a brisk walk)?: 1 day    On average, how many minutes do you engage in exercise at this level?: Patient declined  Stress: Stress Concern Present (08/09/2023)   Received from [facility] - Occupational Stress Questionnaire    Feeling of Stress : To some extent  Social  Connections: Socially Isolated (08/09/2023)   Received from [facility]   Social Network    How would you rate your social network (family, work, friends)?: Little participation, lonely and socially isolated  Intimate Partner Violence: Not At Risk (08/09/2023)   Received from [facility]   HITS    Over the last 12 months how often did your partner physically hurt you?: Never    Over the last 12 months how often did your partner insult you or talk down to you?:  Never    Over the last 12 months how often did your partner threaten you with physical harm?: Never    Over the last 12 months how often did your partner scream or curse at you?: Never    Family History: Family History  Problem Relation Age of Onset   Migraines Mother    Asthma Brother    Cancer Maternal Aunt    Neuromuscular disorder Neg Hx    Autoimmune disease Neg Hx     Review of Systems: Denies any recent fevers, chills or changes in health  Physical Exam: Vital Signs BP 112/74 (BP Location: Left Arm, Patient Position: Sitting, Cuff Size: Normal)   Pulse 69   Ht 5' 6 (1.676 m)   Wt 159 lb 6.4 oz (72.3 kg)   SpO2 97%   BMI 25.73 kg/m   Physical Exam  Constitutional:      General: Not in acute distress.    Appearance: Normal appearance. Not ill-appearing.  HENT:     Head: Normocephalic and atraumatic.  Neck:     Musculoskeletal: Normal range of motion.  Cardiovascular:     Rate and Rhythm: Normal rate Pulmonary:     Effort: Pulmonary effort is normal. No respiratory distress.  Musculoskeletal: Normal range of motion.  Skin:    General: Skin is warm and dry.     Findings: No erythema or rash.  Neurological:     Mental Status: Alert and oriented to person, place, and time. Mental status is at baseline.  Psychiatric:        Mood and Affect: Mood normal.        Behavior: Behavior normal.    Assessment/Plan: The patient is scheduled for bilateral breast reduction with Dr. Waddell.  Risks, benefits, and  alternatives of procedure discussed, questions answered and consent obtained.    Smoking Status: Nonsmoker; Counseling Given? NA Last Mammogram: NA due to age  Caprini Score: 3; Risk Factors include: Age, BMI > 25, and length of planned surgery. Recommendation for mechanical prophylaxis. Encourage early ambulation.   Pictures obtained: @consult   Post-op Rx sent to pharmacy: Oxycodone , Zofran   Instructed patient to hold her guanfacine and hold her Qulipta  as well as her Ubrelvy  the day of surgery.  Patient expressed understanding.  Patient was provided with the breast reduction and General Surgical Risk consent document and Pain Medication Agreement prior to their appointment.  They had adequate time to read through the risk consent documents and Pain Medication Agreement. We also discussed them in person together during this preop appointment. All of their questions were answered to their satisfaction.  Recommended calling if they have any further questions.  Risk consent form and Pain Medication Agreement to be scanned into patient's chart.  The risk that can be encountered with breast reduction were discussed and include the following but not limited to these:  Breast asymmetry, fluid accumulation, firmness of the breast, inability to breast feed, loss of nipple or areola, skin loss, decrease or no nipple sensation, fat necrosis of the breast tissue, bleeding, infection, healing delay.  There are risks of anesthesia, changes to skin sensation and injury to nerves or blood vessels.  The muscle can be temporarily or permanently injured.  You may have an allergic reaction to tape, suture, glue, blood products which can result in skin discoloration, swelling, pain, skin lesions, poor healing.  Any of these can lead to the need for revisonal surgery or stage procedures.  A reduction has potential to interfere with diagnostic procedures.  Nipple or breast piercing can increase risks of infection.  This  procedure is best done when the breast is fully developed.  Changes in the breast will continue to occur over time.  Pregnancy can alter the outcomes of previous breast reduction surgery, weight gain and weigh loss can also effect the long term appearance.     Electronically signed by: Estefana FORBES Peck, PA-C 01/10/2024 11:47 AM

## 2024-01-12 ENCOUNTER — Encounter: Payer: Self-pay | Admitting: Obstetrics and Gynecology

## 2024-01-12 ENCOUNTER — Ambulatory Visit: Admitting: Obstetrics and Gynecology

## 2024-01-12 ENCOUNTER — Encounter: Payer: Self-pay | Admitting: Physician Assistant

## 2024-01-12 ENCOUNTER — Other Ambulatory Visit (HOSPITAL_COMMUNITY)
Admission: RE | Admit: 2024-01-12 | Discharge: 2024-01-12 | Disposition: A | Discharge status: Home / Self Care | Source: Ambulatory Visit | Attending: Obstetrics and Gynecology | Admitting: Obstetrics and Gynecology

## 2024-01-12 VITALS — BP 108/67 | HR 75 | Ht 66.0 in | Wt 156.0 lb

## 2024-01-12 DIAGNOSIS — N871 Moderate cervical dysplasia: Secondary | ICD-10-CM | POA: Insufficient documentation

## 2024-01-12 DIAGNOSIS — Z23 Encounter for immunization: Secondary | ICD-10-CM

## 2024-01-12 DIAGNOSIS — Z3202 Encounter for pregnancy test, result negative: Secondary | ICD-10-CM | POA: Diagnosis not present

## 2024-01-12 LAB — POCT URINE PREGNANCY: Preg Test, Ur: NEGATIVE

## 2024-01-12 NOTE — Progress Notes (Signed)
   GYNECOLOGY OFFICE PROCEDURE NOTE  Vicki Harper is a 28 y.o. G1P1001 here for LEEP. No GYN concerns. Pap smear and colposcopy history reviewed.    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  Role of HPV,cervical dysplasia and need for close followup was empasized. Informed written consent was obtained. All questions were answered. Time out performed. Urine pregnancy test was Negative.  ??Procedure: The patient was placed in lithotomy position and the bivalved coated speculum was placed in the patient's vagina. A grounding pad placed on the patient. Acetic acid was applied to the cervix and the colposcopy was repeated.   Local anesthesia was administered via an intracervical block using 10 ml of 2% Lidocaine  with epinephrine . The suction was turned on and the 10x42mm loop on 50 Watts of blended current was used to excise the affected area. 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 Excellent hemostasis was achieved using roller ball coagulation set at 50 Watts coagulation current. Monsel's solution was then applied and the speculum was removed from the vagina. Specimens were sent to pathology.  ?The patient tolerated the procedure well. Post-operative instructions given to patient, including instruction to seek medical attention for persistent bright red bleeding, fever, abdominal/pelvic pain, dysuria, nausea or vomiting. She was also told about the possibility of having copious yellow to black tinged discharge for weeks. She was counseled to avoid anything in the vagina (sex/douching/tampons) for 4 weeks. She has a 4 week  post-operative check to assess wound healing, review results and discuss further management.   If additional procedures are needed, plan for OR.  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

## 2024-01-12 NOTE — Patient Instructions (Signed)
 It is normal to have cramping for the next 2-3 days and bleeding for up to 1 month. You should feel better every day.   Sometimes the scar from the LEEP will fall off around the 2 week mark and people will have heavy bleeding. If it doesn't stop within 1-2 hours, please be seen at [facility] in [place]  Please call us  if you have any severe pain, bleeding that soaks more than 1 pad in a hour, have fevers, or feel like you're going to pass out.   You can take tylenol  1000mg  every 8 hours and ibuprofen  800mg  every 8 hours as needed for pain. It is ok to take both at the same time.

## 2024-01-13 ENCOUNTER — Ambulatory Visit: Payer: Self-pay | Admitting: Obstetrics and Gynecology

## 2024-01-13 DIAGNOSIS — D069 Carcinoma in situ of cervix, unspecified: Secondary | ICD-10-CM

## 2024-01-13 LAB — SURGICAL PATHOLOGY

## 2024-01-21 ENCOUNTER — Emergency Department (HOSPITAL_COMMUNITY)

## 2024-01-21 ENCOUNTER — Other Ambulatory Visit: Payer: Self-pay

## 2024-01-21 ENCOUNTER — Emergency Department (HOSPITAL_COMMUNITY)
Admission: EM | Admit: 2024-01-21 | Discharge: 2024-01-22 | Disposition: A | Discharge status: Home / Self Care | Attending: Emergency Medicine | Admitting: Emergency Medicine

## 2024-01-21 ENCOUNTER — Encounter (HOSPITAL_COMMUNITY): Payer: Self-pay

## 2024-01-21 DIAGNOSIS — R519 Headache, unspecified: Secondary | ICD-10-CM | POA: Diagnosis present

## 2024-01-21 DIAGNOSIS — G43809 Other migraine, not intractable, without status migrainosus: Secondary | ICD-10-CM | POA: Diagnosis not present

## 2024-01-21 LAB — COMPREHENSIVE METABOLIC PANEL WITH GFR
ALT: 12 U/L (ref 0–44)
AST: 16 U/L (ref 15–41)
Albumin: 3.8 g/dL (ref 3.5–5.0)
Alkaline Phosphatase: 38 U/L (ref 38–126)
Anion gap: 4 — ABNORMAL LOW (ref 5–15)
BUN: 10 mg/dL (ref 6–20)
CO2: 24 mmol/L (ref 22–32)
Calcium: 9 mg/dL (ref 8.9–10.3)
Chloride: 108 mmol/L (ref 98–111)
Creatinine, Ser: 0.93 mg/dL (ref 0.44–1.00)
GFR, Estimated: >60 mL/min (ref >60)
Glucose, Bld: 86 mg/dL (ref 70–99)
Potassium: 3.7 mmol/L (ref 3.5–5.1)
Sodium: 136 mmol/L (ref 135–145)
Total Bilirubin: 0.9 mg/dL (ref 0.0–1.2)
Total Protein: 7 g/dL (ref 6.5–8.1)

## 2024-01-21 LAB — CBC WITH DIFFERENTIAL/PLATELET
Abs Immature Granulocytes: 0.02 K/uL (ref 0.00–0.07)
Basophils Absolute: 0 K/uL (ref 0.0–0.1)
Basophils Relative: 0 %
Eosinophils Absolute: 0 K/uL (ref 0.0–0.5)
Eosinophils Relative: 0 %
HCT: 39.2 % (ref 36.0–46.0)
Hemoglobin: 12.9 g/dL (ref 12.0–15.0)
Immature Granulocytes: 0 %
Lymphocytes Relative: 13 %
Lymphs Abs: 0.6 K/uL — ABNORMAL LOW (ref 0.7–4.0)
MCH: 30.4 pg (ref 26.0–34.0)
MCHC: 32.9 g/dL (ref 30.0–36.0)
MCV: 92.5 fL (ref 80.0–100.0)
Monocytes Absolute: 0.3 K/uL (ref 0.1–1.0)
Monocytes Relative: 6 %
Neutro Abs: 3.9 K/uL (ref 1.7–7.7)
Neutrophils Relative %: 81 %
Platelets: 250 K/uL (ref 150–400)
RBC: 4.24 MIL/uL (ref 3.87–5.11)
RDW: 12.3 % (ref 11.5–15.5)
WBC: 4.8 K/uL (ref 4.0–10.5)
nRBC: 0 % (ref 0.0–0.2)

## 2024-01-21 LAB — TROPONIN I (HIGH SENSITIVITY): Troponin I (High Sensitivity): <2 ng/L (ref <18)

## 2024-01-21 LAB — RESP PANEL BY RT-PCR (RSV, FLU A&B, COVID)  RVPGX2
Influenza A by PCR: NEGATIVE
Influenza B by PCR: NEGATIVE
Resp Syncytial Virus by PCR: NEGATIVE
SARS Coronavirus 2 by RT PCR: NEGATIVE

## 2024-01-21 MED ORDER — METOCLOPRAMIDE HCL 5 MG/ML IJ SOLN
10.0000 mg | Freq: Once | INTRAMUSCULAR | Status: AC
  Administered 2024-01-21: 10 mg via INTRAVENOUS
  Filled 2024-01-21: qty 2

## 2024-01-21 MED ORDER — KETOROLAC TROMETHAMINE 15 MG/ML IJ SOLN
15.0000 mg | Freq: Once | INTRAMUSCULAR | Status: AC
  Administered 2024-01-21: 15 mg via INTRAVENOUS
  Filled 2024-01-21: qty 1

## 2024-01-21 MED ORDER — BUTALBITAL-APAP-CAFFEINE 50-325-40 MG PO TABS
1.0000 | ORAL_TABLET | Freq: Once | ORAL | Status: AC
  Administered 2024-01-21: 1 via ORAL
  Filled 2024-01-21: qty 1

## 2024-01-21 NOTE — ED Provider Notes (Signed)
 [place] EMERGENCY DEPARTMENT AT [facility] Provider Note   CSN: [id number] Arrival date & time: 01/21/24  1430   Patient presents with: Headache  Vicki Harper is a 28 y.o. female with hx of migraine headaches, anxiety, LGSIL pending repeat LEEP in 10/25. She states that she woke up feeling that she was coming down with something; started with a headache and then began having tingling in BLU and BLL extremities.  She states that this was intermittent throughout the day, never had any blurred or double vision.  Did have onset of chest tightness with a right-sided chest pain that has been intermittent throughout the day as well.  History of anxiety as well on Prozac.  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  On Ubrelvy  and Qulipta  for migraines. LMP 2021, on depo injection.    HPI     Prior to Admission medications   Medication Sig Start Date End Date Taking? Authorizing Provider  albuterol  (VENTOLIN  HFA) 108 (90 Base) MCG/ACT inhaler 1 puff as needed Inhalation every 4-6 hrs, cough/wheeze/shortness of breath for 90 days    [provider]  Atogepant  (QULIPTA ) 60 MG TABS Take 1 tablet (60 mg total) by mouth daily. 11/04/23   Lomax, Amy, NP  cetirizine (ZYRTEC) 10 MG tablet Take by mouth. Patient not taking: Reported on 01/12/2024 08/18/23   [provider]  EPINEPHrine  0.3 mg/0.3 mL IJ SOAJ injection SMARTSIG:IM As Directed PRN 11/05/21   [provider]  FLUoxetine (PROZAC) 40 MG capsule Take 40 mg by mouth daily.    [provider]  fluticasone -salmeterol (ADVAIR) 100-50 MCG/ACT AEPB 1 puff 2 (two) times daily. 06/23/23   [provider]  glycopyrrolate  (ROBINUL ) 1 MG tablet Take 1 mg by mouth 3 (three) times daily. 12/17/23   [provider]  guanFACINE (INTUNIV) 2 MG TB24 ER tablet Take 2 mg by mouth at bedtime.    [provider]   medroxyPROGESTERone  Acetate 150 MG/ML SUSY INJECT 150 MG INTRAMUSCULARLY ONCE 08/09/23   [provider]  nifedipine 0.3 % ointment apply TO anus FOUR TIMES DAILY FOR ONE MONTH Patient not taking: Reported on 01/12/2024 11/16/23   [provider]  ondansetron  (ZOFRAN ) 4 MG tablet Take 1 tablet (4 mg total) by mouth every 8 (eight) hours as needed for up to 20 doses for nausea or vomiting. 01/10/24   Andris Estefana BRAVO, PA-C  oxyCODONE  (ROXICODONE ) 5 MG immediate release tablet Take 1 tablet (5 mg total) by mouth every 6 (six) hours as needed for up to 20 doses for severe pain (pain score 7-10). 01/10/24   Andris Estefana BRAVO, PA-C  tacrolimus (PROTOPIC) 0.1 % ointment Apply 1 Application topically daily. Patient not taking: Reported on 01/12/2024 09/30/23   [provider]  tiZANidine (ZANAFLEX) 2 MG tablet Take 2-4 mg by mouth at bedtime. 12/24/23   [provider]  traZODone (DESYREL) 100 MG tablet Take 100 mg by mouth at bedtime. 10/04/23   [provider]  Ubrogepant  (UBRELVY ) 100 MG TABS Take 1 tablet (100 mg total) by mouth daily as needed. 11/04/23   Lomax, Amy, NP    Allergies: Penicillins    Review of Systems  Constitutional: Negative.   HENT: Negative.    Eyes: Negative.   Respiratory:  Positive for chest tightness.   Cardiovascular:  Positive for chest pain.  Gastrointestinal:  Positive for nausea.  Genitourinary: Negative.   Skin:  Negative.   Neurological:  Negative for dizziness, light-headedness and headaches.  Psychiatric/Behavioral:  The patient is nervous/anxious.     Updated Vital Signs BP 108/69   Pulse 76   Temp 98.6 F (37 C)   Resp 16   Ht 5' 6 (1.676 m)   Wt 72.6 kg   LMP  (LMP Unknown) Comment: Around 3 years ago  SpO2 100%   BMI 25.82 kg/m   Physical Exam Vitals and nursing note reviewed.  Constitutional:      Appearance: She is not ill-appearing or toxic-appearing.  HENT:     Head: Normocephalic and atraumatic.      Nose: Nose normal.     Mouth/Throat:     Mouth: Mucous membranes are moist.     Pharynx: Oropharynx is clear. Uvula midline. No oropharyngeal exudate or posterior oropharyngeal erythema.     Tonsils: No tonsillar exudate.  Eyes:     General: Lids are normal. Vision grossly intact.        Right eye: No discharge.        Left eye: No discharge.     Extraocular Movements: Extraocular movements intact.     Conjunctiva/sclera: Conjunctivae normal.     Pupils: Pupils are equal, round, and reactive to light.  Neck:     Trachea: Trachea and phonation normal.  Cardiovascular:     Rate and Rhythm: Normal rate and regular rhythm.     Pulses: Normal pulses.     Heart sounds: Normal heart sounds. No murmur heard. Pulmonary:     Effort: Pulmonary effort is normal. No tachypnea, bradypnea, accessory muscle usage, prolonged expiration or respiratory distress.     Breath sounds: Normal breath sounds. No wheezing or rales.  Chest:     Chest wall: No mass, lacerations, deformity, swelling, tenderness, crepitus or edema.  Abdominal:     General: Bowel sounds are normal. There is no distension.     Palpations: Abdomen is soft.     Tenderness: There is no abdominal tenderness. There is no right CVA tenderness, left CVA tenderness, guarding or rebound.  Musculoskeletal:        General: No deformity.     Cervical back: Normal range of motion and neck supple.     Right lower leg: No edema.     Left lower leg: No edema.  Skin:    General: Skin is warm and dry.     Capillary Refill: Capillary refill takes less than 2 seconds.  Neurological:     Mental Status: She is alert. Mental status is at baseline.     GCS: GCS eye subscore is 4. GCS verbal subscore is 5. GCS motor subscore is 6.     Cranial Nerves: Cranial nerves 2-12 are intact.     Sensory: Sensation is intact.     Motor: Motor function is intact.     Coordination: Coordination is intact.     Gait: Gait is intact.  Psychiatric:         Attention and Perception: Attention normal.        Mood and Affect: Mood normal.        Speech: Speech normal.        Behavior: Behavior normal.     (all labs ordered are listed, but only abnormal results are displayed) Labs Reviewed  CBC WITH DIFFERENTIAL/PLATELET - Abnormal; Notable for the following components:      Result Value   Lymphs Abs 0.6 (*)    All other components within normal  limits  COMPREHENSIVE METABOLIC PANEL WITH GFR - Abnormal; Notable for the following components:   Anion gap 4 (*)    All other components within normal limits  RESP PANEL BY RT-PCR (RSV, FLU A&B, COVID)  RVPGX2  TROPONIN I (HIGH SENSITIVITY)    EKG: None  Radiology: DG Chest 2 View Result Date: 01/22/2024 CLINICAL DATA:  Chest tightness, shortness of breath EXAM: CHEST - 2 VIEW COMPARISON:  05/11/2023 FINDINGS: The heart size and mediastinal contours are within normal limits. Both lungs are clear. The visualized skeletal structures are unremarkable. No pneumothorax. IMPRESSION: No active cardiopulmonary disease. Electronically Signed   By: Franky Crease M.D.   On: 01/22/2024 00:21   CT Head Wo Contrast Result Date: 01/21/2024 CLINICAL DATA:  Headache, increasing frequency or severity EXAM: CT HEAD WITHOUT CONTRAST TECHNIQUE: Contiguous axial images were obtained from the base of the skull through the vertex without intravenous contrast. RADIATION DOSE REDUCTION: This exam was performed according to the departmental dose-optimization program which includes automated exposure control, adjustment of the mA and/or kV according to patient size and/or use of iterative reconstruction technique. COMPARISON:  CT head 01/26/23 FINDINGS: Brain: No evidence of large-territorial acute infarction. No parenchymal hemorrhage. No mass lesion. No extra-axial collection. No mass effect or midline shift. No hydrocephalus. Basilar cisterns are patent. Vascular: No hyperdense vessel. Skull: No acute fracture or focal lesion.  Sinuses/Orbits: Paranasal sinuses and mastoid air cells are clear. The orbits are unremarkable. Other: None. IMPRESSION: No acute intracranial abnormality. Electronically Signed   By: Morgane  Naveau M.D.   On: 01/21/2024 17:22     Procedures   Medications Ordered in the ED  metoCLOPramide  (REGLAN ) injection 10 mg (10 mg Intravenous Given 01/21/24 2309)  ketorolac  (TORADOL ) 15 MG/ML injection 15 mg (15 mg Intravenous Given 01/21/24 2310)  butalbital -acetaminophen -caffeine  (FIORICET ) 50-325-40 MG per tablet 1 tablet (1 tablet Oral Given 01/21/24 2309)                                    Medical Decision Making 28 year old female who presents with concern for headache, tingling in the arms, and chest pain.  Normal vitals on intake.  Cardiopulmonary abdominal exams benign.  Neurologic exam is nonfocal.  Patient endorses frontal headache, persistent right-sided chest pain.  Denies any recent new activities, no chest soreness on palpation.  Emergent considerations for headache include subarachnoid hemorrhage, meningitis, temporal arteritis, glaucoma, cerebral ischemia, carotid/vertebral dissection, intracranial tumor, Venous sinus thrombosis, carbon monoxide poisoning, acute or chronic subdural hemorrhage.  Other considerations include: Migraine, Cluster headache, Tension headache, Hypertension, Caffeine  / alcohol / drug withdrawal, Pseudotumor cerebri, Arteriovenous malformation, Head injury, Neurocysticercosis, Post-lumbar puncture, Preeclampsia, Cervical arthritis, Refractive error causing strain, Dental abscess, Sinusitis, Otitis media, Temporomandibular joint syndrome, Depression, Somatoform disorder (eg, somatization) Trigeminal neuralgia, Glossopharyngeal neuralgia.   Amount and/or Complexity of Data Reviewed Labs:     Details: CBC unremarkable, CMP unremarkable, RVP negative.  Troponin negative.  Radiology: ordered.    Details:  CT head negative.  CXR negative. ECG/medicine tests:      Details: EKG with normal sinus rhythm history of limited  Risk Prescription drug management.   Patient reevaluated after migraine cocktail with complete resolution of her headache, tingling, and chest pain. Clinical picture most consistent with acute migraine headache; likely psychogenic/anxiety component to reported chest tightness/pain.   Clinical concern for emergent underlying etiology that would warrant further ED workup or inpatient management is exceedingly low.  Kimesha voiced understanding of her medical evaluation and treatment plan. Each of their questions answered to their expressed satisfaction.  Return precautions were given.  Patient is well-appearing, stable, and was discharged in good condition.  This chart was dictated using voice recognition software, Dragon. Despite the best efforts of this provider to proofread and correct errors, errors may still occur which can change documentation meaning.     Final diagnoses:  Other migraine without status migrainosus, not intractable    ED Discharge Orders     None          Bobette Pleasant SAUNDERS, PA-C 01/22/24 0105    Franklyn Sid SAILOR, MD 01/22/24 1312

## 2024-01-21 NOTE — ED Provider Triage Note (Signed)
 Emergency Medicine Provider Triage Evaluation Note  Vicki Harper , a 28 y.o. female  was evaluated in triage.  Pt complains of headache.  Patient has a history of migraine headaches here with concerns of a headache that developed this morning has not responded well to any intervention.  Reports that while she was at work, had a tingling feeling in bilateral upper extremities.  States that she took Qulipta  earlier without improvement in symptoms.  Reports it is feel somewhat similar to her prior headaches.  Denies concerns for pregnancy.  Denies any alcohol or drug use.  Review of Systems  Positive: As above Negative: As above  Physical Exam  BP 116/80 (BP Location: Right Arm)   Pulse 85   Temp 99.1 F (37.3 C)   Resp 16   Ht 5' 6 (1.676 m)   Wt 72.6 kg   LMP  (LMP Unknown) Comment: Around 3 years ago  SpO2 100%   BMI 25.82 kg/m  Gen:   Awake, no distress   Resp:  Normal effort  MSK:   Moves extremities without difficulty  Other:  PERRL  Medical Decision Making  Medically screening exam initiated at 2:41 PM.  Appropriate orders placed.  Vicki Harper was informed that the remainder of the evaluation will be completed by another provider, this initial triage assessment does not replace that evaluation, and the importance of remaining in the ED until their evaluation is complete.     Vicki Harper A, PA-C 01/21/24 1441

## 2024-01-21 NOTE — ED Triage Notes (Signed)
 Pt was not feeling well this am, then developed a headache, pt has bilateral arm tingling. Pt denies visual difficulties. Pt took prescription med at home prior to arrival. Pt c/o mid-sternal chest tightness that started this am as well. Pt denies SHOB.

## 2024-01-22 NOTE — Discharge Instructions (Addendum)
 You were seen in the ER today for your headache and chest pain. Your workup and physical exam are very reassuring today. You may continue your home medications as prescribed. Follow up with your primary care doctor and return to the ER with any new severe symptoms.

## 2024-01-23 ENCOUNTER — Encounter (HOSPITAL_BASED_OUTPATIENT_CLINIC_OR_DEPARTMENT_OTHER): Payer: Self-pay

## 2024-01-23 ENCOUNTER — Emergency Department (HOSPITAL_BASED_OUTPATIENT_CLINIC_OR_DEPARTMENT_OTHER)

## 2024-01-23 ENCOUNTER — Emergency Department (HOSPITAL_BASED_OUTPATIENT_CLINIC_OR_DEPARTMENT_OTHER)
Admission: EM | Admit: 2024-01-23 | Discharge: 2024-01-23 | Disposition: A | Discharge status: Home / Self Care | Attending: Emergency Medicine | Admitting: Emergency Medicine

## 2024-01-23 ENCOUNTER — Other Ambulatory Visit: Payer: Self-pay

## 2024-01-23 DIAGNOSIS — Z7951 Long term (current) use of inhaled steroids: Secondary | ICD-10-CM | POA: Insufficient documentation

## 2024-01-23 DIAGNOSIS — R079 Chest pain, unspecified: Secondary | ICD-10-CM

## 2024-01-23 DIAGNOSIS — I3139 Other pericardial effusion (noninflammatory): Secondary | ICD-10-CM | POA: Diagnosis not present

## 2024-01-23 DIAGNOSIS — N189 Chronic kidney disease, unspecified: Secondary | ICD-10-CM | POA: Diagnosis not present

## 2024-01-23 DIAGNOSIS — M940 Chondrocostal junction syndrome [Tietze]: Secondary | ICD-10-CM | POA: Insufficient documentation

## 2024-01-23 DIAGNOSIS — J90 Pleural effusion, not elsewhere classified: Secondary | ICD-10-CM | POA: Diagnosis not present

## 2024-01-23 DIAGNOSIS — J45909 Unspecified asthma, uncomplicated: Secondary | ICD-10-CM | POA: Insufficient documentation

## 2024-01-23 DIAGNOSIS — E039 Hypothyroidism, unspecified: Secondary | ICD-10-CM | POA: Diagnosis not present

## 2024-01-23 DIAGNOSIS — R0789 Other chest pain: Secondary | ICD-10-CM | POA: Diagnosis present

## 2024-01-23 LAB — BASIC METABOLIC PANEL WITH GFR
Anion gap: 11 (ref 5–15)
BUN: 8 mg/dL (ref 6–20)
CO2: 22 mmol/L (ref 22–32)
Calcium: 9.3 mg/dL (ref 8.9–10.3)
Chloride: 107 mmol/L (ref 98–111)
Creatinine, Ser: 0.99 mg/dL (ref 0.44–1.00)
GFR, Estimated: >60 mL/min (ref >60)
Glucose, Bld: 97 mg/dL (ref 70–99)
Potassium: 4 mmol/L (ref 3.5–5.1)
Sodium: 140 mmol/L (ref 135–145)

## 2024-01-23 LAB — D-DIMER, QUANTITATIVE: D-Dimer, Quant: 0.94 ug{FEU}/mL — ABNORMAL HIGH (ref 0.00–0.50)

## 2024-01-23 LAB — TROPONIN T, HIGH SENSITIVITY: Troponin T High Sensitivity: <15 ng/L (ref 0–19)

## 2024-01-23 LAB — CBC
HCT: 39 % (ref 36.0–46.0)
Hemoglobin: 12.9 g/dL (ref 12.0–15.0)
MCH: 30.4 pg (ref 26.0–34.0)
MCHC: 33.1 g/dL (ref 30.0–36.0)
MCV: 91.8 fL (ref 80.0–100.0)
Platelets: 242 K/uL (ref 150–400)
RBC: 4.25 MIL/uL (ref 3.87–5.11)
RDW: 12.8 % (ref 11.5–15.5)
WBC: 4.2 K/uL (ref 4.0–10.5)
nRBC: 0 % (ref 0.0–0.2)

## 2024-01-23 LAB — HCG, SERUM, QUALITATIVE: Preg, Serum: NEGATIVE

## 2024-01-23 MED ORDER — INDOMETHACIN 25 MG PO CAPS: 25.0000 mg | ORAL_CAPSULE | Freq: Three times a day (TID) | ORAL | 0 refills | Status: AC | PRN

## 2024-01-23 MED ORDER — KETOROLAC TROMETHAMINE 15 MG/ML IJ SOLN
15.0000 mg | Freq: Once | INTRAMUSCULAR | Status: AC
  Administered 2024-01-23: 15 mg via INTRAVENOUS
  Filled 2024-01-23: qty 1

## 2024-01-23 MED ORDER — SODIUM CHLORIDE 0.9 % IV BOLUS
1000.0000 mL | Freq: Once | INTRAVENOUS | Status: AC
  Administered 2024-01-23: 1000 mL via INTRAVENOUS

## 2024-01-23 MED ORDER — DIAZEPAM 5 MG/ML IJ SOLN
5.0000 mg | Freq: Once | INTRAMUSCULAR | Status: AC
  Administered 2024-01-23: 5 mg via INTRAVENOUS
  Filled 2024-01-23: qty 2

## 2024-01-23 MED ORDER — IOHEXOL 350 MG/ML SOLN
75.0000 mL | Freq: Once | INTRAVENOUS | Status: AC | PRN
  Administered 2024-01-23: 75 mL via INTRAVENOUS

## 2024-01-23 MED ORDER — CYCLOBENZAPRINE HCL 10 MG PO TABS: 10.0000 mg | ORAL_TABLET | Freq: Two times a day (BID) | ORAL | 0 refills | Status: DC | PRN

## 2024-01-23 NOTE — Discharge Instructions (Addendum)
 It was a pleasure caring for you today in the emergency department.  Your symptoms today are likely secondary to inflammation in the cartilage that connects your rib bones to your sternum.    A small amount of fluid was noted around your heart/lungs on your imaging today.  Recommend you follow-up for outpatient echocardiogram to further evaluate. I have sent a referral (cardiology) to help facilitate this.   Return to the Emergency Department if you have unusual chest pain, pressure, or discomfort, shortness of breath, nausea, vomiting, burping, heartburn, tingling upper body parts, sweating, cold, clammy skin, or racing heartbeat. Call 911 if you think you are having a heart attack. Take all cardiac medications as prescribed - notify your doctor if you have any side effects. Follow cardiac diet - avoid fatty & fried foods, don't eat too much red meat, eat lots of fruits & vegetables, and dairy products should be low fat. Please lose weight if you are overweight. Become more active with walking, gardening, or any other activity that gets you to moving.   Please return to the emergency department immediately for any new or concerning symptoms, or if you get worse.

## 2024-01-23 NOTE — ED Provider Notes (Signed)
 [place] [facility] Provider Note  CSN: [id number] Arrival date & time: 01/23/24 9181  Chief Complaint(s) Chest Pain  HPI Vicki Harper is a 28 y.o. female with past medical history as below, significant for anemia, anxiety, asthma, CKD, migraine, MDD, LGSIL who presents to the ED with complaint of chest pain  She was seen at [facility] on 01/21/2024 with headache, tingling to her extremities, chest tightness.  Her workup at that time was reassuring including negative troponin, EKG was nonischemic.  Her headache resolved after intervention, chest discomfort also had improved.  She was discharged in stable condition at that time.  Patient reports to the ER today with recurrence of chest tightness.  Patient describes the pain is along her sternum bilateral, worsen with position changes or deep inspiration.  Denies any significant difficulty breathing, no coughing, fevers or chills, no chest trauma.  No syncope or near syncope, no recent travel or sick contacts, no history of VTE  Past Medical History Past Medical History:  Diagnosis Date   Anemia    Anxiety    Asthma    Back pain    Chronic kidney disease    Depression    Dyspnea    Headache    Hypothyroidism    Migraines    Vaginal Pap smear, abnormal    Patient Active Problem List   Diagnosis Date Noted   Muscle weakness (generalized) 07/23/2021   Migraine without aura and without status migrainosus, not intractable 09/23/2020   Major depressive disorder, recurrent episode, moderate (HCC) 07/15/2020   LGSIL on Pap smear of cervix 09/14/2019   Chronic pain of left knee 11/23/2014   Home Medication(s) Prior to Admission medications   Medication Sig Start Date End Date Taking? Authorizing Provider  cyclobenzaprine  (FLEXERIL ) 10 MG tablet Take 1 tablet (10 mg total) by mouth 2 (two) times daily as needed for muscle spasms. 01/23/24  Yes Elnor Jayson LABOR, DO  indomethacin  (INDOCIN ) 25 MG capsule  Take 1 capsule (25 mg total) by mouth 3 (three) times daily with meals as needed for moderate pain (pain score 4-6). 01/23/24  Yes Elnor Jayson LABOR, DO  albuterol  (VENTOLIN  HFA) 108 (90 Base) MCG/ACT inhaler 1 puff as needed Inhalation every 4-6 hrs, cough/wheeze/shortness of breath for 90 days    [provider]  Atogepant  (QULIPTA ) 60 MG TABS Take 1 tablet (60 mg total) by mouth daily. 11/04/23   Lomax, Amy, NP  cetirizine (ZYRTEC) 10 MG tablet Take by mouth. Patient not taking: Reported on 01/12/2024 08/18/23   [provider]  EPINEPHrine  0.3 mg/0.3 mL IJ SOAJ injection SMARTSIG:IM As Directed PRN 11/05/21   [provider]  FLUoxetine (PROZAC) 40 MG capsule Take 40 mg by mouth daily.    [provider]  fluticasone -salmeterol (ADVAIR) 100-50 MCG/ACT AEPB 1 puff 2 (two) times daily. 06/23/23   [provider]  glycopyrrolate  (ROBINUL ) 1 MG tablet Take 1 mg by mouth 3 (three) times daily. 12/17/23   [provider]  guanFACINE (INTUNIV) 2 MG TB24 ER tablet Take 2 mg by mouth at bedtime.    [provider]  medroxyPROGESTERone  Acetate 150 MG/ML SUSY INJECT 150 MG INTRAMUSCULARLY ONCE 08/09/23   [provider]  nifedipine 0.3 % ointment apply TO anus FOUR TIMES DAILY FOR ONE MONTH Patient not taking: Reported on 01/12/2024 11/16/23   [provider]  ondansetron  (ZOFRAN ) 4 MG tablet Take 1 tablet (4 mg total) by mouth every 8 (eight) hours as needed  for up to 20 doses for nausea or vomiting. 01/10/24   Andris Estefana BRAVO, PA-C  oxyCODONE  (ROXICODONE ) 5 MG immediate release tablet Take 1 tablet (5 mg total) by mouth every 6 (six) hours as needed for up to 20 doses for severe pain (pain score 7-10). 01/10/24   Andris Estefana BRAVO, PA-C  tacrolimus (PROTOPIC) 0.1 % ointment Apply 1 Application topically daily. Patient not taking: Reported on 01/12/2024 09/30/23   [provider]  tiZANidine (ZANAFLEX) 2 MG tablet Take 2-4 mg by mouth  at bedtime. 12/24/23   [provider]  traZODone (DESYREL) 100 MG tablet Take 100 mg by mouth at bedtime. 10/04/23   [provider]  Ubrogepant  (UBRELVY ) 100 MG TABS Take 1 tablet (100 mg total) by mouth daily as needed. 11/04/23   Cary No, NP                                                                                                                                    Past Surgical History Past Surgical History:  Procedure Laterality Date   NO PAST SURGERIES     Family History Family History  Problem Relation Age of Onset   Migraines Mother    Asthma Brother    Cancer Maternal Aunt    Neuromuscular disorder Neg Hx    Autoimmune disease Neg Hx     Social History Social History   Tobacco Use   Smoking status: Never   Smokeless tobacco: Never  Vaping Use   Vaping status: Never Used  Substance Use Topics   Alcohol use: No    Alcohol/week: 0.0 standard drinks of alcohol   Drug use: No   Allergies Penicillins  Review of Systems A thorough review of systems was obtained and all systems are negative except as noted in the HPI and PMH.   Physical Exam Vital Signs  I have reviewed the triage vital signs BP 115/81   Pulse 61   Temp 98 F (36.7 C) (Oral)   Resp 14   Ht 5' 6 (1.676 m)   Wt 72.6 kg   LMP  (LMP Unknown) Comment: Around 3 years ago  SpO2 100%   BMI 25.82 kg/m  Physical Exam Vitals and nursing note reviewed.  Constitutional:      General: She is not in acute distress.    Appearance: Normal appearance.  HENT:     Head: Normocephalic and atraumatic.     Right Ear: External ear normal.     Left Ear: External ear normal.     Nose: Nose normal.     Mouth/Throat:     Mouth: Mucous membranes are moist.  Eyes:     General: No scleral icterus.       Right eye: No discharge.        Left eye: No discharge.  Cardiovascular:     Rate and Rhythm: Normal rate and regular rhythm.  Pulses: Normal pulses.     Heart sounds: Normal heart  sounds.  Pulmonary:     Effort: Pulmonary effort is normal. Tachypnea present. No respiratory distress.     Breath sounds: Normal breath sounds. No stridor.  Abdominal:     General: Abdomen is flat. There is no distension.     Palpations: Abdomen is soft.     Tenderness: There is no abdominal tenderness.  Musculoskeletal:     Cervical back: No rigidity.     Right lower leg: No edema.     Left lower leg: No edema.  Skin:    General: Skin is warm and dry.     Capillary Refill: Capillary refill takes less than 2 seconds.  Neurological:     Mental Status: She is alert.  Psychiatric:        Mood and Affect: Mood normal.        Behavior: Behavior normal. Behavior is cooperative.     ED Results and Treatments Labs (all labs ordered are listed, but only abnormal results are displayed) Labs Reviewed  D-DIMER, QUANTITATIVE - Abnormal; Notable for the following components:      Result Value   D-Dimer, Quant 0.94 (*)    All other components within normal limits  BASIC METABOLIC PANEL WITH GFR  CBC  HCG, SERUM, QUALITATIVE  TROPONIN T, HIGH SENSITIVITY                                                                                                                          Radiology CT Angio Chest PE W and/or Wo Contrast Result Date: 01/23/2024 CLINICAL DATA:  Chest tightness since Friday. Now with worsening and painful inspiration. EXAM: CT ANGIOGRAPHY CHEST WITH CONTRAST TECHNIQUE: Multidetector CT imaging of the chest was performed using the standard protocol during bolus administration of intravenous contrast. Multiplanar CT image reconstructions and MIPs were obtained to evaluate the vascular anatomy. RADIATION DOSE REDUCTION: This exam was performed according to the departmental dose-optimization program which includes automated exposure control, adjustment of the mA and/or kV according to patient size and/or use of iterative reconstruction technique. CONTRAST:  75mL OMNIPAQUE  IOHEXOL   350 MG/ML SOLN COMPARISON:  None Available. FINDINGS: Cardiovascular: The heart size is upper normal to borderline enlarged. Trace pericardial effusion evident. No thoracic aortic aneurysm. No substantial atherosclerotic calcification of the thoracic aorta. There is no filling defect within the opacified pulmonary arteries to suggest the presence of an acute pulmonary embolus. Mediastinum/Nodes: Soft tissue density in the anterior mediastinum is likely thymic remnant. Fluid is identified in superior pericardial recesses. No mediastinal lymphadenopathy. There is no hilar lymphadenopathy. The esophagus has normal imaging features. There is no axillary lymphadenopathy. Lungs/Pleura: No focal airspace consolidation. No pulmonary edema. Tiny bilateral pleural effusions evident. Upper Abdomen: 10 mm hypodensity in the posterior hepatic dome is too small to characterize but statistically is likely benign. No followup imaging is recommended. The visualized portion of the upper abdomen is otherwise unremarkable. Musculoskeletal: No worrisome lytic  or sclerotic osseous abnormality. Review of the MIP images confirms the above findings. IMPRESSION: 1. No CT evidence for acute pulmonary embolus. 2. Tiny bilateral pleural effusions. 3. Trace pericardial effusion with fluid visible in the pericardial recesses. 4. Soft tissue attenuation anterior mediastinum is likely thymic remnant. Electronically Signed   By: Camellia Candle M.D.   On: 01/23/2024 09:51   DG Chest 2 View Result Date: 01/23/2024 EXAM: 2 VIEW(S) XRAY OF THE CHEST 01/23/2024 08:44:00 AM COMPARISON: 01/21/2024 CLINICAL HISTORY: Chest pain. C/o chest tightness since Friday, seen at [facility] with normal results. States has worsened and painful to take a deep breath. Hx - asthma, CKD. FINDINGS: LUNGS AND PLEURA: No focal pulmonary opacity. No pulmonary edema. No pleural effusion. No pneumothorax. HEART AND MEDIASTINUM: No acute abnormality of the cardiac and mediastinal  silhouettes. BONES AND SOFT TISSUES: No acute osseous abnormality. IMPRESSION: 1. No acute process. Electronically signed by: Waddell Calk MD 01/23/2024 09:11 AM EDT RP Workstation: [id number]    Pertinent labs & imaging results that were available during my care of the patient were reviewed by me and considered in my medical decision making (see MDM for details).  Medications Ordered in ED Medications  diazepam  (VALIUM ) injection 5 mg (5 mg Intravenous Given 01/23/24 0911)  sodium chloride  0.9 % bolus 1,000 mL (0 mLs Intravenous Stopped 01/23/24 1027)  iohexol  (OMNIPAQUE ) 350 MG/ML injection 75 mL (75 mLs Intravenous Contrast Given 01/23/24 0932)  ketorolac  (TORADOL ) 15 MG/ML injection 15 mg (15 mg Intravenous Given 01/23/24 1141)                                                                                                                                     Procedures Procedures  (including critical care time)  Medical Decision Making / ED Course    Medical Decision Making:    Oaklie Davonne Rueda is a 28 y.o. female  with past medical history as below, significant for anemia, anxiety, asthma, CKD, migraine, MDD, LGSIL who presents to the ED with complaint of chest pain. The complaint involves an extensive differential diagnosis and also carries with it a high risk of complications and morbidity.  Serious etiology was considered. Ddx includes but is not limited to: Differential includes all life-threatening causes for chest pain. This includes but is not exclusive to acute coronary syndrome, aortic dissection, pulmonary embolism, cardiac tamponade, community-acquired pneumonia, pericarditis, musculoskeletal chest wall pain, etc.   Complete initial physical exam performed, notably the patient was in no distress, hds, no hypoxia.    Reviewed and confirmed nursing documentation for past medical history, family history, social history.  Vital signs reviewed.    Chest tightness > - recent  ED eval 8/15, stable w/u - give valium  - low risk well's score, d-dimer + > CT PE w/o PE - Symptoms greatly improved following intervention, symptoms likely secondary to costochondritis.  ACS is unlikely given low risk heart score, negative troponin  and reassuring workup  Trace b/l pleural eff Trace pericardial eff > - Unclear etiology, she has no hypoxia, does not appear volume overloaded on exam. No peripheral edema. No orthopnea. Troponin is not elevated.  Symptoms ongoing for multiple days now - EKG nonischemic, did not show evidence of pericarditis, no friction rub or distant heart sound, she is HDS, doubt tamponade - Recommend outpatient follow-up/ outpatient echocardiogram, pt is agreeable          The patient's chest pain is not suggestive of pulmonary embolus, cardiac ischemia, aortic dissection, pericarditis, myocarditis, pulmonary embolism, pneumothorax, pneumonia, Zoster, or esophageal perforation, or other serious etiology.  Historically not abrupt in onset, tearing or ripping, pulses symmetric. EKG nonspecific for ischemia/infarction. No dysrhythmias, brugada, WPW, prolonged QT noted.   Troponin negative. CXR reviewed. Labs without demonstration of acute pathology unless otherwise noted above. Low HEART Score: 0-3 points (0.9-1.7% risk of MACE).    12:22 PM:  I have discussed the diagnosis/risks/treatment options with the patient.  Evaluation and diagnostic testing in the emergency department does not suggest an emergent condition requiring admission or immediate intervention beyond what has been performed at this time.  They will follow up with cardiology, PCP. We also discussed returning to the ED immediately if new or worsening sx occur. We discussed the sx which are most concerning (e.g., sudden worsening pain, fever, inability to tolerate by mouth, chest pain, dyspnea, leg swelling) that necessitate immediate return.   The patient appears reasonably screen and/or  stabilized for discharge and I doubt any other medical condition or other [facility] requiring further screening, evaluation, or treatment in the ED at this time prior to discharge.                  Additional history obtained: -Additional history obtained from na -External records from outside source obtained and reviewed including: Chart review including previous notes, labs, imaging, consultation notes including  Recent ER evaluation, allergy list, home medications   Lab Tests: -I ordered, reviewed, and interpreted labs.   The pertinent results include:   Labs Reviewed  D-DIMER, QUANTITATIVE - Abnormal; Notable for the following components:      Result Value   D-Dimer, Quant 0.94 (*)    All other components within normal limits  BASIC METABOLIC PANEL WITH GFR  CBC  HCG, SERUM, QUALITATIVE  TROPONIN T, HIGH SENSITIVITY    Notable for D-dimer elevated  EKG   EKG Interpretation Date/Time:  Sunday January 23 2024 08:26:57 EDT Ventricular Rate:  72 PR Interval:  169 QRS Duration:  91 QT Interval:  403 QTC Calculation: 441 R Axis:   60  Text Interpretation: Sinus rhythm similar prior no stemi Confirmed by Elnor Savant ([id number]) on 01/23/2024 8:33:13 AM         Imaging Studies ordered: I ordered imaging studies including chest x-ray, CT PE I independently visualized the following imaging with scope of interpretation limited to determining acute life threatening conditions related to emergency care; findings noted above I agree with the radiologist interpretation If any imaging was obtained with contrast I closely monitored patient for any possible adverse reaction a/w contrast administration in the emergency department   Medicines ordered and prescription drug management: Meds ordered this encounter  Medications   diazepam  (VALIUM ) injection 5 mg   sodium chloride  0.9 % bolus 1,000 mL   iohexol  (OMNIPAQUE ) 350 MG/ML injection 75 mL   ketorolac  (TORADOL ) 15 MG/ML  injection 15 mg   cyclobenzaprine  (FLEXERIL ) 10 MG tablet  Sig: Take 1 tablet (10 mg total) by mouth 2 (two) times daily as needed for muscle spasms.    Dispense:  20 tablet    Refill:  0   indomethacin  (INDOCIN ) 25 MG capsule    Sig: Take 1 capsule (25 mg total) by mouth 3 (three) times daily with meals as needed for moderate pain (pain score 4-6).    Dispense:  15 capsule    Refill:  0    -I have reviewed the patients home medicines and have made adjustments as needed   Consultations Obtained: Not applicable  Cardiac Monitoring: The patient was maintained on a cardiac monitor.  I personally viewed and interpreted the cardiac monitored which showed an underlying rhythm of: NSR Continuous pulse oximetry interpreted by myself, 100% on RA.    Social Determinants of Health:  Diagnosis or treatment significantly limited by social determinants of health: na   Reevaluation: After the interventions noted above, I reevaluated the patient and found that they have improved  Co morbidities that complicate the patient evaluation  Past Medical History:  Diagnosis Date   Anemia    Anxiety    Asthma    Back pain    Chronic kidney disease    Depression    Dyspnea    Headache    Hypothyroidism    Migraines    Vaginal Pap smear, abnormal       Dispostion: Disposition decision including need for hospitalization was considered, and patient discharged from emergency department.    Final Clinical Impression(s) / ED Diagnoses Final diagnoses:  Chest pain with low risk of acute coronary syndrome  Costochondritis  Pericardial effusion  Pleural effusion        Elnor Jayson LABOR, DO 01/23/24 1222

## 2024-01-23 NOTE — ED Notes (Signed)
 ED Provider at bedside.

## 2024-01-23 NOTE — ED Triage Notes (Signed)
 C/o chest tightness since Friday, seen at [facility] with normal results. States has worsened and painful to take a deep breath.

## 2024-01-27 ENCOUNTER — Other Ambulatory Visit: Payer: Self-pay

## 2024-01-27 ENCOUNTER — Encounter (HOSPITAL_BASED_OUTPATIENT_CLINIC_OR_DEPARTMENT_OTHER): Payer: Self-pay | Admitting: Plastic Surgery

## 2024-01-28 ENCOUNTER — Encounter: Payer: Self-pay | Admitting: Cardiology

## 2024-01-28 ENCOUNTER — Ambulatory Visit: Attending: Cardiology | Admitting: Cardiology

## 2024-01-28 ENCOUNTER — Telehealth: Payer: Self-pay | Admitting: Plastic Surgery

## 2024-01-28 VITALS — BP 104/67 | HR 72 | Resp 16 | Ht 66.0 in | Wt 160.0 lb

## 2024-01-28 DIAGNOSIS — M940 Chondrocostal junction syndrome [Tietze]: Secondary | ICD-10-CM

## 2024-01-28 NOTE — Progress Notes (Signed)
 Cardiology Office Note:  .   Date:  01/28/2024  ID:  Vicki Harper, DOB 1995-11-18, MRN [id number] PCP: Rosalea Rosina SAILOR, PA  [facility] Providers Cardiologist:  Gordy Bergamo, MD   History of Present Illness: .   Vicki Harper is a 28 y.o. African-American female patient with anxiety and depression, reactive airway disease with bronchial asthma, chronic migraine headaches, referred from the emergency room after she presented with chest pain on 01/21/2024 and again on 01/23/2023.  She was ruled out for myocardial infarction and ACS, coronary CT angiogram on 01/23/2024 essentially revealing no evidence of pulmonary embolism with trace pericardial effusion.  CT scan of the head for headaches revealed no abnormality.  Cardiac Studies relevent.        Discussed the use of AI scribe software for clinical note transcription with the patient, who gave verbal consent to proceed.  History of Present Illness Vicki Harper is a 28 year old female who presents with chest pain.  Chest pain began on Friday while at work, leading to an emergency visit. Initial workup, including blood work, x-ray, CT scan, and EKG, was normal. She returned to the emergency room on August 17th due to persistent pain. The pain is a tight sensation in the middle of the chest, sometimes radiating to either side, primarily in the upper chest, rated 5/10 in severity. It is not related to physical activity and has been persistent. She experiences shortness of breath and numbness and tingling in the arms and legs, without complete breathlessness.  Her medical history includes migraines, asthma, hypothyroidism, anxiety, and depression. She does not take thyroid supplements. Thyroid function shows low TSH with normal free T4 and free T3 levels. Tested negative for Graves' disease.  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 She is a non-smoker and has family  support nearby. No significant health changes aside from the current chest pain.   Labs    Recent Labs    01/21/24 1440 01/23/24 0825  NA 136 140  K 3.7 4.0  CL 108 107  CO2 24 22  GLUCOSE 86 97  BUN 10 8  CREATININE 0.93 0.99  CALCIUM 9.0 9.3  GFRNONAA >60 >60    Lab Results  Component Value Date   ALT 12 01/21/2024   AST 16 01/21/2024   ALKPHOS 38 01/21/2024   BILITOT 0.9 01/21/2024      Latest Ref Rng & Units 01/23/2024    8:25 AM 01/21/2024    2:40 PM 03/30/2022    7:37 AM  CBC  WBC 4.0 - 10.5 K/uL 4.2  4.8  10.2   Hemoglobin 12.0 - 15.0 g/dL 87.0  87.0  87.1   Hematocrit 36.0 - 46.0 % 39.0  39.2  38.9   Platelets 150 - 400 K/uL 242  250  235    Lab Results  Component Value Date   HGBA1C 5.4 07/23/2021     ROS  Review of Systems  Cardiovascular:  Negative for chest pain, dyspnea on exertion and leg swelling.   Physical Exam:   VS:  BP 104/67 (BP Location: Left Arm, Patient Position: Sitting, Cuff Size: Normal)   Pulse 72   Resp 16   Ht 5' 6 (1.676 m)   Wt 160 lb (72.6 kg)   LMP  (LMP Unknown) Comment: Around 3 years ago  SpO2 100%   BMI 25.82 kg/m    Wt Readings from Last 3 Encounters:  01/28/24 160 lb (72.6 kg)  01/23/24 160 lb (72.6 kg)  01/21/24 160 lb (72.6 kg)    BP Readings from Last 3 Encounters:  01/28/24 104/67  01/23/24 114/69  01/21/24 108/69   Physical Exam Neck:     Vascular: No carotid bruit or JVD.  Cardiovascular:     Rate and Rhythm: Normal rate and regular rhythm.     Pulses: Intact distal pulses.     Heart sounds: Normal heart sounds. No murmur heard.    No gallop.  Pulmonary:     Effort: Pulmonary effort is normal.     Breath sounds: Normal breath sounds.  Chest:  Breasts:    Right: Tenderness (upper costochondral junction) present.  Abdominal:     General: Bowel sounds are normal.     Palpations: Abdomen is soft.  Musculoskeletal:     Right lower leg: No edema.     Left lower leg: No edema.    EKG:        EKG 01/23/2024: Normal sinus rhythm at rate of 72 bpm.  T wave inversion anterior leads normal for age and sex.  No evidence of ischemia, normal EKG.  Reactive airway disease, seen  ASSESSMENT AND PLAN: .      ICD-10-CM   1. Acute costochondritis  M94.0      Assessment & Plan Costochondritis (Chondrocostal junction syndrome) Intermittent chest pain for one week, described as tightness in the middle of the chest, sometimes radiating bilaterally. Pain is unrelated to physical activity and is non-cardiac in origin. Examination reveals tenderness at the costochondral junction, indicating musculoskeletal pain. CT scan and EKG are normal, ruling out cardiac causes. Diagnosis of costochondritis is made, likely due to inflammation at the costochondral junction. The condition is benign but can be recurrent and persistent due to cartilage movement during breathing. The small amount of pericardial fluid observed is normal and not indicative of pericarditis. - Advise use of ice packs on the affected area to reduce inflammation. - Recommend taking Aleve  daily for one week to manage inflammation. - Instruct to use Motrin  or Aleve  for 2-3 days if pain recurs. - Educate on the benign nature of costochondritis and its potential for recurrence. - Reassure that no further cardiac investigations are necessary as EKG and CT scan are normal. - Advise to have cholesterol checked during next visit with primary care provider.  I have reviewed all her labs from hospitalization, chest x-ray, reported mild pericardial effusion/trace pericardial effusion noted on the CT scan.  Do not suspect acute pericarditis.  I have reassured the patient. Follow up: PRN  Signed,  Gordy Bergamo, MD, [facility] 01/28/2024, 12:12 PM [facility] [address] [place], [place] [address] Phone: [phone]. Fax:  [phone]

## 2024-01-28 NOTE — Telephone Encounter (Signed)
 Called her and left a VM to reschedule from 9-29

## 2024-01-28 NOTE — Patient Instructions (Signed)

## 2024-02-02 ENCOUNTER — Ambulatory Visit: Admitting: Internal Medicine

## 2024-02-04 ENCOUNTER — Encounter (HOSPITAL_BASED_OUTPATIENT_CLINIC_OR_DEPARTMENT_OTHER)
Admission: RE | Disposition: A | Discharge status: Home / Self Care | Payer: Self-pay | Source: Home / Self Care | Attending: Plastic Surgery

## 2024-02-04 ENCOUNTER — Ambulatory Visit (HOSPITAL_BASED_OUTPATIENT_CLINIC_OR_DEPARTMENT_OTHER): Admitting: Certified Registered"

## 2024-02-04 ENCOUNTER — Other Ambulatory Visit: Payer: Self-pay

## 2024-02-04 ENCOUNTER — Ambulatory Visit (HOSPITAL_BASED_OUTPATIENT_CLINIC_OR_DEPARTMENT_OTHER)
Admission: RE | Admit: 2024-02-04 | Discharge: 2024-02-04 | Disposition: A | Discharge status: Home / Self Care | Attending: Plastic Surgery | Admitting: Plastic Surgery

## 2024-02-04 ENCOUNTER — Encounter (HOSPITAL_BASED_OUTPATIENT_CLINIC_OR_DEPARTMENT_OTHER): Payer: Self-pay | Admitting: Plastic Surgery

## 2024-02-04 DIAGNOSIS — N6022 Fibroadenosis of left breast: Secondary | ICD-10-CM | POA: Insufficient documentation

## 2024-02-04 DIAGNOSIS — Z7951 Long term (current) use of inhaled steroids: Secondary | ICD-10-CM | POA: Diagnosis not present

## 2024-02-04 DIAGNOSIS — Z803 Family history of malignant neoplasm of breast: Secondary | ICD-10-CM | POA: Insufficient documentation

## 2024-02-04 DIAGNOSIS — Z79899 Other long term (current) drug therapy: Secondary | ICD-10-CM | POA: Insufficient documentation

## 2024-02-04 DIAGNOSIS — M542 Cervicalgia: Secondary | ICD-10-CM | POA: Insufficient documentation

## 2024-02-04 DIAGNOSIS — N62 Hypertrophy of breast: Secondary | ICD-10-CM | POA: Diagnosis not present

## 2024-02-04 DIAGNOSIS — E039 Hypothyroidism, unspecified: Secondary | ICD-10-CM | POA: Diagnosis not present

## 2024-02-04 DIAGNOSIS — M546 Pain in thoracic spine: Secondary | ICD-10-CM | POA: Diagnosis not present

## 2024-02-04 DIAGNOSIS — F32A Depression, unspecified: Secondary | ICD-10-CM | POA: Diagnosis not present

## 2024-02-04 DIAGNOSIS — N189 Chronic kidney disease, unspecified: Secondary | ICD-10-CM | POA: Diagnosis not present

## 2024-02-04 DIAGNOSIS — F419 Anxiety disorder, unspecified: Secondary | ICD-10-CM | POA: Insufficient documentation

## 2024-02-04 DIAGNOSIS — J45909 Unspecified asthma, uncomplicated: Secondary | ICD-10-CM | POA: Insufficient documentation

## 2024-02-04 DIAGNOSIS — D241 Benign neoplasm of right breast: Secondary | ICD-10-CM | POA: Insufficient documentation

## 2024-02-04 DIAGNOSIS — Z79621 Long term (current) use of calcineurin inhibitor: Secondary | ICD-10-CM | POA: Insufficient documentation

## 2024-02-04 DIAGNOSIS — M549 Dorsalgia, unspecified: Secondary | ICD-10-CM | POA: Diagnosis not present

## 2024-02-04 DIAGNOSIS — R519 Headache, unspecified: Secondary | ICD-10-CM | POA: Diagnosis not present

## 2024-02-04 DIAGNOSIS — Z01818 Encounter for other preprocedural examination: Secondary | ICD-10-CM

## 2024-02-04 HISTORY — PX: BREAST REDUCTION SURGERY: SHX8

## 2024-02-04 LAB — POCT PREGNANCY, URINE: Preg Test, Ur: NEGATIVE

## 2024-02-04 SURGERY — MAMMOPLASTY, REDUCTION
Anesthesia: General | Laterality: Bilateral

## 2024-02-04 MED ORDER — FENTANYL CITRATE (PF) 100 MCG/2ML IJ SOLN
INTRAMUSCULAR | Status: AC
  Filled 2024-02-04: qty 2

## 2024-02-04 MED ORDER — OXYCODONE HCL 5 MG PO TABS
ORAL_TABLET | ORAL | Status: AC
  Filled 2024-02-04: qty 1

## 2024-02-04 MED ORDER — CHLORHEXIDINE GLUCONATE CLOTH 2 % EX PADS: 6.0000 | MEDICATED_PAD | Freq: Once | CUTANEOUS | Status: DC

## 2024-02-04 MED ORDER — DEXMEDETOMIDINE HCL IN NACL 80 MCG/20ML IV SOLN
INTRAVENOUS | Status: AC
  Filled 2024-02-04: qty 20

## 2024-02-04 MED ORDER — BUPIVACAINE HCL (PF) 0.25 % IJ SOLN
INTRAMUSCULAR | Status: AC
Start: 2024-02-04 — End: 2024-02-04
  Filled 2024-02-04: qty 30

## 2024-02-04 MED ORDER — DEXAMETHASONE SODIUM PHOSPHATE 10 MG/ML IJ SOLN
INTRAMUSCULAR | Status: AC
  Filled 2024-02-04: qty 1

## 2024-02-04 MED ORDER — AMISULPRIDE (ANTIEMETIC) 5 MG/2ML IV SOLN: 10.0000 mg | Freq: Once | INTRAVENOUS | Status: DC | PRN

## 2024-02-04 MED ORDER — FENTANYL CITRATE (PF) 100 MCG/2ML IJ SOLN
INTRAMUSCULAR | Status: DC | PRN
  Administered 2024-02-04 (×4): 50 ug via INTRAVENOUS

## 2024-02-04 MED ORDER — SUGAMMADEX SODIUM 200 MG/2ML IV SOLN
INTRAVENOUS | Status: DC | PRN
  Administered 2024-02-04: 150 mg via INTRAVENOUS

## 2024-02-04 MED ORDER — HYDROMORPHONE HCL 1 MG/ML IJ SOLN
INTRAMUSCULAR | Status: DC | PRN
  Administered 2024-02-04: .5 mg via INTRAVENOUS

## 2024-02-04 MED ORDER — LIDOCAINE 2% (20 MG/ML) 5 ML SYRINGE
INTRAMUSCULAR | Status: DC | PRN
  Administered 2024-02-04: 5 mg via INTRAVENOUS

## 2024-02-04 MED ORDER — EPHEDRINE SULFATE-NACL 50-0.9 MG/10ML-% IV SOSY
PREFILLED_SYRINGE | INTRAVENOUS | Status: DC | PRN
  Administered 2024-02-04 (×4): 5 mg via INTRAVENOUS

## 2024-02-04 MED ORDER — EPHEDRINE 5 MG/ML INJ
INTRAVENOUS | Status: AC
Start: 2024-02-04 — End: 2024-02-04
  Filled 2024-02-04: qty 5

## 2024-02-04 MED ORDER — SODIUM CHLORIDE (PF) 0.9 % IJ SOLN
INTRAMUSCULAR | Status: DC | PRN
  Administered 2024-02-04: 100 mL

## 2024-02-04 MED ORDER — ROCURONIUM BROMIDE 10 MG/ML (PF) SYRINGE
PREFILLED_SYRINGE | INTRAVENOUS | Status: AC
Start: 2024-02-04 — End: 2024-02-04
  Filled 2024-02-04: qty 10

## 2024-02-04 MED ORDER — PROPOFOL 10 MG/ML IV BOLUS
INTRAVENOUS | Status: DC | PRN
  Administered 2024-02-04: 200 mg via INTRAVENOUS

## 2024-02-04 MED ORDER — DEXAMETHASONE SODIUM PHOSPHATE 10 MG/ML IJ SOLN
INTRAMUSCULAR | Status: DC | PRN
  Administered 2024-02-04: 5 mg via INTRAVENOUS

## 2024-02-04 MED ORDER — OXYCODONE HCL 5 MG/5ML PO SOLN: 5.0000 mg | Freq: Once | ORAL | Status: AC | PRN

## 2024-02-04 MED ORDER — BUPIVACAINE LIPOSOME 1.3 % IJ SUSP
INTRAMUSCULAR | Status: AC
  Filled 2024-02-04: qty 20

## 2024-02-04 MED ORDER — FENTANYL CITRATE (PF) 100 MCG/2ML IJ SOLN
25.0000 ug | INTRAMUSCULAR | Status: DC | PRN
  Administered 2024-02-04: 25 ug via INTRAVENOUS
  Administered 2024-02-04: 50 ug via INTRAVENOUS

## 2024-02-04 MED ORDER — CLINDAMYCIN PHOSPHATE 900 MG/50ML IV SOLN
INTRAVENOUS | Status: AC
  Filled 2024-02-04: qty 50

## 2024-02-04 MED ORDER — LIDOCAINE 2% (20 MG/ML) 5 ML SYRINGE
INTRAMUSCULAR | Status: AC
  Filled 2024-02-04: qty 5

## 2024-02-04 MED ORDER — OXYCODONE HCL 5 MG PO TABS
5.0000 mg | ORAL_TABLET | Freq: Once | ORAL | Status: AC | PRN
  Administered 2024-02-04: 5 mg via ORAL

## 2024-02-04 MED ORDER — LACTATED RINGERS IV SOLN: INTRAVENOUS | Status: DC

## 2024-02-04 MED ORDER — DEXMEDETOMIDINE HCL IN NACL 80 MCG/20ML IV SOLN
INTRAVENOUS | Status: DC | PRN
Start: 2024-02-04 — End: 2024-02-04
  Administered 2024-02-04: 12 ug via INTRAVENOUS

## 2024-02-04 MED ORDER — ONDANSETRON HCL 4 MG/2ML IJ SOLN
INTRAMUSCULAR | Status: DC | PRN
  Administered 2024-02-04: 4 mg via INTRAVENOUS

## 2024-02-04 MED ORDER — ROCURONIUM BROMIDE 10 MG/ML (PF) SYRINGE
PREFILLED_SYRINGE | INTRAVENOUS | Status: DC | PRN
Start: 2024-02-04 — End: 2024-02-04
  Administered 2024-02-04: 50 mg via INTRAVENOUS

## 2024-02-04 MED ORDER — MIDAZOLAM HCL 2 MG/2ML IJ SOLN
INTRAMUSCULAR | Status: DC | PRN
  Administered 2024-02-04: 2 mg via INTRAVENOUS

## 2024-02-04 MED ORDER — MIDAZOLAM HCL 2 MG/2ML IJ SOLN
INTRAMUSCULAR | Status: AC
  Filled 2024-02-04: qty 2

## 2024-02-04 MED ORDER — 0.9 % SODIUM CHLORIDE (POUR BTL) OPTIME
TOPICAL | Status: DC | PRN
  Administered 2024-02-04 (×3): 1000 mL

## 2024-02-04 MED ORDER — HYDROMORPHONE HCL 1 MG/ML IJ SOLN
INTRAMUSCULAR | Status: AC
  Filled 2024-02-04: qty 0.5

## 2024-02-04 MED ORDER — ONDANSETRON HCL 4 MG/2ML IJ SOLN
INTRAMUSCULAR | Status: AC
  Filled 2024-02-04: qty 2

## 2024-02-04 MED ORDER — CLINDAMYCIN PHOSPHATE 900 MG/50ML IV SOLN
900.0000 mg | INTRAVENOUS | Status: AC
  Administered 2024-02-04: 900 mg via INTRAVENOUS

## 2024-02-04 MED ORDER — ONDANSETRON HCL 4 MG/2ML IJ SOLN
INTRAMUSCULAR | Status: AC
Start: 2024-02-04 — End: 2024-02-04
  Filled 2024-02-04: qty 2

## 2024-02-04 SURGICAL SUPPLY — 58 items
BINDER BREAST 3XL (GAUZE/BANDAGES/DRESSINGS) IMPLANT
BINDER BREAST LRG (GAUZE/BANDAGES/DRESSINGS) IMPLANT
BINDER BREAST MEDIUM (GAUZE/BANDAGES/DRESSINGS) IMPLANT
BINDER BREAST XLRG (GAUZE/BANDAGES/DRESSINGS) IMPLANT
BINDER BREAST XXLRG (GAUZE/BANDAGES/DRESSINGS) IMPLANT
BIOPATCH RED 1 DISK 7.0 (GAUZE/BANDAGES/DRESSINGS) ×2 IMPLANT
BLADE HEX COATED 2.75 (ELECTRODE) IMPLANT
BLADE SURG 10 STRL SS (BLADE) ×6 IMPLANT
BLADE SURG 15 STRL LF DISP TIS (BLADE) ×1 IMPLANT
CANISTER SUCT 1200ML W/VALVE (MISCELLANEOUS) IMPLANT
COTTONBALL LRG STERILE PKG (GAUZE/BANDAGES/DRESSINGS) IMPLANT
DERMABOND ADVANCED .7 DNX12 (GAUZE/BANDAGES/DRESSINGS) ×4 IMPLANT
DRAIN CHANNEL 19F RND (DRAIN) ×2 IMPLANT
DRAPE IMP U-DRAPE 54X76 (DRAPES) IMPLANT
DRAPE UTILITY XL STRL (DRAPES) ×1 IMPLANT
DRSG TEGADERM 4X4.75 (GAUZE/BANDAGES/DRESSINGS) ×2 IMPLANT
ELECTRODE BLDE 4.0 EZ CLN MEGD (MISCELLANEOUS) ×1 IMPLANT
ELECTRODE REM PT RTRN 9FT ADLT (ELECTROSURGICAL) ×2 IMPLANT
EVACUATOR SILICONE 100CC (DRAIN) ×2 IMPLANT
GAUZE PAD ABD 8X10 STRL (GAUZE/BANDAGES/DRESSINGS) ×2 IMPLANT
GAUZE SPONGE 2X2 STRL 8-PLY (GAUZE/BANDAGES/DRESSINGS) ×2 IMPLANT
GAUZE XEROFORM 5X9 LF (GAUZE/BANDAGES/DRESSINGS) IMPLANT
GLOVE BIO SURGEON STRL SZ 6.5 (GLOVE) IMPLANT
GLOVE BIO SURGEON STRL SZ7.5 (GLOVE) IMPLANT
GLOVE BIO SURGEON STRL SZ8 (GLOVE) ×1 IMPLANT
GLOVE BIOGEL PI IND STRL 7.0 (GLOVE) IMPLANT
GLOVE BIOGEL PI IND STRL 8 (GLOVE) ×1 IMPLANT
GOWN STRL REUS W/ TWL LRG LVL3 (GOWN DISPOSABLE) ×1 IMPLANT
GOWN STRL REUS W/TWL XL LVL3 (GOWN DISPOSABLE) ×1 IMPLANT
HIBICLENS CHG 4% 4OZ BTL (MISCELLANEOUS) ×1 IMPLANT
MANIFOLD NEPTUNE II (INSTRUMENTS) ×1 IMPLANT
MARKER SKIN DUAL TIP RULER LAB (MISCELLANEOUS) ×1 IMPLANT
NDL HYPO 22X1.5 SAFETY MO (MISCELLANEOUS) ×2 IMPLANT
NEEDLE HYPO 22X1.5 SAFETY MO (MISCELLANEOUS) ×2 IMPLANT
NS IRRIG 1000ML POUR BTL (IV SOLUTION) ×1 IMPLANT
PACK BASIN DAY SURGERY FS (CUSTOM PROCEDURE TRAY) ×1 IMPLANT
PACK UNIVERSAL I (CUSTOM PROCEDURE TRAY) ×1 IMPLANT
PENCIL SMOKE EVACUATOR (MISCELLANEOUS) ×2 IMPLANT
PIN SAFETY STERILE (MISCELLANEOUS) ×1 IMPLANT
SLEEVE SCD COMPRESS KNEE MED (STOCKING) ×1 IMPLANT
SPONGE T-LAP 18X18 ~~LOC~~+RFID (SPONGE) ×3 IMPLANT
STAPLER SKIN PROX WIDE 3.9 (STAPLE) ×1 IMPLANT
STRIP CLOSURE SKIN 1/2X4 (GAUZE/BANDAGES/DRESSINGS) IMPLANT
SUT MNCRL AB 3-0 PS2 27 (SUTURE) ×4 IMPLANT
SUT MNCRL AB 4-0 PS2 18 (SUTURE) ×4 IMPLANT
SUT MON AB 2-0 CT1 36 (SUTURE) ×1 IMPLANT
SUT MON AB 5-0 PS2 18 (SUTURE) IMPLANT
SUT SILK 2 0 SH (SUTURE) ×2 IMPLANT
SUT VIC AB 3-0 SH 27X BRD (SUTURE) IMPLANT
SYR 20ML LL LF (SYRINGE) ×2 IMPLANT
SYR BULB IRRIG 60ML STRL (SYRINGE) IMPLANT
SYR CONTROL 10ML LL (SYRINGE) ×1 IMPLANT
TAPE MEASURE VINYL STERILE (MISCELLANEOUS) IMPLANT
TOWEL GREEN STERILE FF (TOWEL DISPOSABLE) ×2 IMPLANT
TRAY DSU PREP LF (CUSTOM PROCEDURE TRAY) ×1 IMPLANT
TUBE CONNECTING 20X1/4 (TUBING) ×1 IMPLANT
UNDERPAD 30X36 HEAVY ABSORB (UNDERPADS AND DIAPERS) ×2 IMPLANT
YANKAUER SUCT BULB TIP NO VENT (SUCTIONS) ×1 IMPLANT

## 2024-02-04 NOTE — Discharge Instructions (Addendum)
 Activity: Avoid strenuous activity.  No lifting, pushing, or pulling greater than 15 pounds.  Diet: No restrictions.  Try to optimize nutrition with plenty of proteins, fruits, and vegetables to improve healing.   Wound Care: Leave breast binder on for the first week and then you may transition to a front-clipping or front-zipping compression bra.  Sponge bathe only until our visit tomorrow, then we can discuss transition to showering with the emphasis on keeping the surgical site dry.  Replace the ABD pads over incisions with gauze or Maxi pads as needed for incisional drainage.   If you have drains placed, be sure to record the daily output from each side. They will be removed at your appointment Tuesday. Please make sure that the bulbs are charged.  If you experience any issues, please call the office.    Follow-Up: Scheduled for Tuesday.  Things to watch for:  Call the office if you experience fever, chills, intractable vomiting, or significant bleeding.  Mild wound drainage is common after breast reduction surgery and should not be cause for alarm.   Post Anesthesia Home Care Instructions  Activity: Get plenty of rest for the remainder of the day. A responsible individual must stay with you for 24 hours following the procedure.  For the next 24 hours, DO NOT: -Drive a car -Advertising copywriter -Drink alcoholic beverages -Take any medication unless instructed by your physician -Make any legal decisions or sign important papers.  Meals: Start with liquid foods such as gelatin or soup. Progress to regular foods as tolerated. Avoid greasy, spicy, heavy foods. If nausea and/or vomiting occur, drink only clear liquids until the nausea and/or vomiting subsides. Call your physician if vomiting continues.  Special Instructions/Symptoms: Your throat may feel dry or sore from the anesthesia or the breathing tube placed in your throat during surgery. If this causes discomfort, gargle with warm salt  water. The discomfort should disappear within 24 hours.  If you had a scopolamine  patch placed behind your ear for the management of post- operative nausea and/or vomiting:  1. The medication in the patch is effective for 72 hours, after which it should be removed.  Wrap patch in a tissue and discard in the trash. Wash hands thoroughly with soap and water. 2. You may remove the patch earlier than 72 hours if you experience unpleasant side effects which may include dry mouth, dizziness or visual disturbances. 3. Avoid touching the patch. Wash your hands with soap and water after contact with the patch.    Information for Discharge Teaching: EXPAREL  (bupivacaine  liposome injectable suspension)   Pain relief is important to your recovery. The goal is to control your pain so you can move easier and return to your normal activities as soon as possible after your procedure. Your physician may use several types of medicines to manage pain, swelling, and more.  Your surgeon or anesthesiologist gave you EXPAREL (bupivacaine ) to help control your pain after surgery.  EXPAREL  is a local anesthetic designed to release slowly over an extended period of time to provide pain relief by numbing the tissue around the surgical site. EXPAREL  is designed to release pain medication over time and can control pain for up to 72 hours. Depending on how you respond to EXPAREL , you may require less pain medication during your recovery. EXPAREL  can help reduce or eliminate the need for opioids during the first few days after surgery when pain relief is needed the most. EXPAREL  is not an opioid and is not addictive. It  does not cause sleepiness or sedation.   Important! A teal colored band has been placed on your arm with the date, time and amount of EXPAREL  you have received. Please leave this armband in place for the full 96 hours following administration, and then you may remove the band. If you return to the hospital for  any reason within 96 hours following the administration of EXPAREL , the armband provides important information that your health care providers to know, and alerts them that you have received this anesthetic.    Possible side effects of EXPAREL : Temporary loss of sensation or ability to move in the area where medication was injected. Nausea, vomiting, constipation Rarely, numbness and tingling in your mouth or lips, lightheadedness, or anxiety may occur. Call your doctor right away if you think you may be experiencing any of these sensations, or if you have other questions regarding possible side effects.  Follow all other discharge instructions given to you by your surgeon or nurse. Eat a healthy diet and drink plenty of water or other fluids.

## 2024-02-04 NOTE — Interval H&P Note (Signed)
 History and Physical Interval Note: No change in exam or indication for surgery All questions answered Marked for a bilateral breast reduction with her concurrence Will proceed at her request  02/04/2024 11:00 AM  Vicki Harper  has presented today for surgery, with the diagnosis of n62.  The various methods of treatment have been discussed with the patient and family. After consideration of risks, benefits and other options for treatment, the patient has consented to  Procedure(s): MAMMOPLASTY, REDUCTION (Bilateral) as a surgical intervention.  The patient's history has been reviewed, patient examined, no change in status, stable for surgery.  I have reviewed the patient's chart and labs.  Questions were answered to the patient's satisfaction.     [name]

## 2024-02-04 NOTE — Anesthesia Preprocedure Evaluation (Signed)
 Anesthesia Evaluation  Patient identified by MRN, date of birth, ID band Patient awake    Reviewed: Allergy & Precautions, NPO status , Patient's Chart, lab work & pertinent test results  Airway Mallampati: II  TM Distance: >3 FB Neck ROM: Full    Dental  (+) Dental Advisory Given   Pulmonary asthma    breath sounds clear to auscultation       Cardiovascular negative cardio ROS  Rhythm:Regular Rate:Normal     Neuro/Psych  Headaches    GI/Hepatic negative GI ROS, Neg liver ROS,,,  Endo/Other  Hypothyroidism    Renal/GU CRFRenal disease     Musculoskeletal   Abdominal   Peds  Hematology  (+) Blood dyscrasia, anemia   Anesthesia Other Findings   Reproductive/Obstetrics                              Anesthesia Physical Anesthesia Plan  ASA: 2  Anesthesia Plan: General   Post-op Pain Management: Ofirmev  IV (intra-op)* and Toradol  IV (intra-op)*   Induction: Intravenous  PONV Risk Score and Plan: 3 and Dexamethasone , Ondansetron , Midazolam  and Treatment may vary due to age or medical condition  Airway Management Planned: Oral ETT  Additional Equipment:   Intra-op Plan:   Post-operative Plan: Extubation in OR  Informed Consent: I have reviewed the patients History and Physical, chart, labs and discussed the procedure including the risks, benefits and alternatives for the proposed anesthesia with the patient or authorized representative who has indicated his/her understanding and acceptance.     Dental advisory given  Plan Discussed with: CRNA  Anesthesia Plan Comments:         Anesthesia Quick Evaluation

## 2024-02-04 NOTE — Op Note (Signed)
 DATE OF OPERATION: 02/04/2024   LOCATION: [facility] operating Room   PREOPERATIVE DIAGNOSIS: Symptomatic macromastia   POSTOPERATIVE DIAGNOSIS: Same   PROCEDURE: Bilateral breast reduction   SURGEON: Marinell Birmingham, MD   ASSISTANT: Honora Seip   EBL: 100 cc   CONDITION: Stable   COMPLICATIONS: None   INDICATION: The patient, Vicki Harper, is a 28 y.o. female born on Jun 15, 1995, is here for treatment upper back and neck pain secondary to large breast size.     PROCEDURE DETAILS:  The patient was seen prior to surgery and marked.  IV antibiotics were given. The patient was taken to the operating room,SCDs were placed, and given a general anesthetic. A standard time out was performed and all information was confirmed by those in the room.    The chest was prepped and draped in usual sterile manner.  A 42 mm cookie cutter was used to outline the proposed nipple areolar complexes bilaterally and an 8 cm based inferior pedicle was outlined on each breast.  The right breast was addressed first.  Laparotomy tape was placed at the base of breast as a tourniquet and the pedicle was de-epithelialized sharply.  Electrocautery was used to dissect the borders of the pedicle to the chest wall.  Electrocautery was then used to resect the medial lateral and superior triangles of breast tissue and to develop within the superior skin flap.  The breast tissue removed constituted the bulk of the reduction and weighed 366 gms.  The subfascial space over the pectoralis muscle and the subcutaneous tissues were infiltrated with 50 mL of a mixture of Exparel , quarter percent plain Marcaine , and saline.  The surgical wound was irrigated and hemostasis ensured with electrocautery.  A 19 French round drain was placed behind the pedicle and brought out through a separate stab incision.  The T point was approximated with a single 2-0 Monocryl suture and the skin edges were tailor tacked in place with  skin clips.  Dermis was approximated with interrupted and running 3-0 Monocryl sutures and the skin was closed with a running 4-0 Monocryl subcuticular stitch.  Attention was turned to the left breast where similar procedure was performed.  After placing a laparotomy tape at the base of the breast as a tourniquet the pedicle was de-epithelialized sharply and dissected to the chest wall with electrocautery.  Medial lateral and superior triangles of breast tissue were resected and the superior skin flap was developed and thinned.  The breast tissue removed constituted the bulk of the reduction and weighed 360 gms.  All breast tissue was sent to pathology for routine examination.  Again the subfascial space was infiltrated with the Exparel  mixture and the surgical bed irrigated.  After ensuring hemostasis a 19 French round drain was placed behind the pedicle and brought out through a separate stab incision. The T point was approximated with a 2-0 Monocryl suture and the skin edges were tailor tacked in place with skin clips.  Dermis was closed with interrupted and running 3-0 Monocryl sutures and skin was closed with running 4-0 Monocryl subcuticular stitch.  All incisions were then sealed with Dermabond the patient was placed in a supportive, compressive garment.  She was awakened from anesthesia without incident and transferred to the recovery room in good condition.  All instrument needle and sponge counts were reported as correct and there were no complications appreciated during the procedure. The patient was allowed to wake up and taken to recovery room in stable condition  at the end of the case. The family was notified at the end of the case.    The advanced practice practitioner (APP) assisted throughout the case.  The APP was essential in retraction and counter traction when needed to make the case progress smoothly.  This retraction and assistance made it possible to see the tissue plans for the procedure.   The assistance was needed for blood control, tissue re-approximation and assisted with closure of the incision site.

## 2024-02-04 NOTE — Anesthesia Procedure Notes (Signed)
 Procedure Name: Intubation Date/Time: 02/04/2024 11:27 AM  Performed by: Delayne Olam BIRCH, CRNAPre-anesthesia Checklist: Patient identified, Emergency Drugs available, Suction available and Patient being monitored Patient Re-evaluated:Patient Re-evaluated prior to induction Oxygen Delivery Method: Circle system utilized Preoxygenation: Pre-oxygenation with 100% oxygen Induction Type: IV induction Ventilation: Mask ventilation without difficulty Laryngoscope Size: Mac and 3 Grade View: Grade I Tube type: Oral Tube size: 7.0 mm Number of attempts: 1 Airway Equipment and Method: Stylet and Oral airway Placement Confirmation: ETT inserted through vocal cords under direct vision, positive ETCO2 and breath sounds checked- equal and bilateral Secured at: 22 cm Tube secured with: Tape Dental Injury: Teeth and Oropharynx as per pre-operative assessment

## 2024-02-04 NOTE — Progress Notes (Signed)
 I was paged by the on-call service in regard to this patient. I tried to call her back, but she did not answer

## 2024-02-04 NOTE — Transfer of Care (Signed)
 Immediate Anesthesia Transfer of Care Note  Patient: Vicki Harper  Procedure(s) Performed: Procedure(s) (LRB): MAMMOPLASTY, REDUCTION (Bilateral)  Patient Location: PACU  Anesthesia Type: General  Level of Consciousness: awake, oriented, sedated and patient cooperative  Airway & Oxygen Therapy: Patient Spontanous Breathing and Patient connected to face mask oxygen  Post-op Assessment: Report given to PACU RN and Post -op Vital signs reviewed and stable  Post vital signs: Reviewed and stable  Complications: No apparent anesthesia complications  Last Vitals:  Vitals Value Taken Time  BP 117/76 02/04/24 14:30  Temp    Pulse 83 02/04/24 14:31  Resp 16 02/04/24 14:31  SpO2 100 % 02/04/24 14:31  Vitals shown include unfiled device data.  Last Pain:  Vitals:   02/04/24 0907  TempSrc: Temporal  PainSc: 0-No pain         Complications: No notable events documented.

## 2024-02-05 NOTE — Anesthesia Postprocedure Evaluation (Signed)
 Anesthesia Post Note  Patient: Vicki Harper  Procedure(s) Performed: MAMMOPLASTY, REDUCTION (Bilateral)     Patient location during evaluation: PACU Anesthesia Type: General Level of consciousness: awake and alert Pain management: pain level controlled Vital Signs Assessment: post-procedure vital signs reviewed and stable Respiratory status: spontaneous breathing, nonlabored ventilation, respiratory function stable and patient connected to nasal cannula oxygen Cardiovascular status: blood pressure returned to baseline and stable Postop Assessment: no apparent nausea or vomiting Anesthetic complications: no   No notable events documented.  Last Vitals:  Vitals:   02/04/24 1524 02/04/24 1540  BP: 108/70 104/73  Pulse: 78 82  Resp: 13 20  Temp:  (!) 36.2 C  SpO2: 98% 98%    Last Pain:  Vitals:   02/04/24 1540  TempSrc: Tympanic  PainSc: 6                  Epifanio Lamar BRAVO

## 2024-02-06 ENCOUNTER — Encounter (HOSPITAL_BASED_OUTPATIENT_CLINIC_OR_DEPARTMENT_OTHER): Payer: Self-pay | Admitting: Plastic Surgery

## 2024-02-08 ENCOUNTER — Encounter: Admitting: Surgical

## 2024-02-08 ENCOUNTER — Ambulatory Visit: Admitting: Physician Assistant

## 2024-02-08 VITALS — BP 116/74 | HR 93

## 2024-02-08 DIAGNOSIS — Z9889 Other specified postprocedural states: Secondary | ICD-10-CM

## 2024-02-08 LAB — SURGICAL PATHOLOGY

## 2024-02-08 NOTE — Progress Notes (Signed)
 Patient is a pleasant 28 year old female s/p bilateral breast reduction performed 02/04/2024 by Dr. Waddell who presents to clinic for postoperative follow-up and drain removal.  Reviewed operative report and nearly 400 g was removed from each breast.  Today, patient is well.  Accompanied by family at bedside.  States that she did have some pain the day after surgery, but she is doing much better now.  She has only had to take a total of 2 oxycodone  since surgery.  She denies any leg swelling, chest pain, difficulty breathing, fevers, or asymmetric breast swelling.  Daily volume output has been negligible, less than 10 cc per side.  She is tolerating p.o. intake without difficulty, voiding urine.  Ambulatory.  No BM yet, but endorses history of IBS-C.  She understands to take MiraLAX and stool softeners as needed.  On exam, breasts have excellent shape and symmetry.  NAC's are healthy, warm, and sensitive to light touch.  Good perfusion.  No seroma or hematoma noted.  Incisions CDI throughout with moderate amount of overlying Dermabond noted.  Drains intact and functional with serosanguineous output in bulbs.  Drains removed without complication or difficulty, well-tolerated by patient.    Patient is doing excellent from a postoperative standpoint.  Follow-up as scheduled.  Continued activity modifications and compressive garments in interim.  She can begin applying Vaseline to her incisions throughout beginning in 4 days.  All questions answered.

## 2024-02-09 ENCOUNTER — Encounter: Payer: Self-pay | Admitting: Obstetrics and Gynecology

## 2024-02-09 ENCOUNTER — Ambulatory Visit: Admitting: Obstetrics and Gynecology

## 2024-02-09 VITALS — BP 101/65 | HR 85 | Ht 66.0 in | Wt 160.0 lb

## 2024-02-09 DIAGNOSIS — Z23 Encounter for immunization: Secondary | ICD-10-CM | POA: Diagnosis not present

## 2024-02-09 DIAGNOSIS — D069 Carcinoma in situ of cervix, unspecified: Secondary | ICD-10-CM | POA: Diagnosis not present

## 2024-02-09 NOTE — Progress Notes (Signed)
   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  Bleeding stopped yesterday. No pain. Doing well overall.  Path with +endocervical margins for CIN3. ECC was negative but noted to have scant endocervical cells.  We already reviewed these results and she is scheduled for repeat excisional procedure 10/14.  Due for HPV vaccine #2 today.  Objective:   Vitals:   02/09/24 1523  BP: 101/65  Pulse: 85  Weight: 160 lb (72.6 kg)  Height: 5' 6 (1.676 m)   General:  Alert, oriented and cooperative. Patient is in no acute distress.  Skin: Skin is warm and dry. No rash noted.   Cardiovascular: Normal heart rate noted  Respiratory: Normal respiratory effort, no problems with respiration noted  Abdomen: Soft, non-tender, non-distended   Pelvic: NEFG. Cervix well healed.  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

## 2024-02-09 NOTE — Addendum Note (Signed)
 Addended by: ELODIE NEST T on: 02/09/2024 03:54 PM   Modules accepted: Orders

## 2024-02-13 ENCOUNTER — Emergency Department (HOSPITAL_BASED_OUTPATIENT_CLINIC_OR_DEPARTMENT_OTHER)

## 2024-02-13 ENCOUNTER — Encounter (HOSPITAL_BASED_OUTPATIENT_CLINIC_OR_DEPARTMENT_OTHER): Payer: Self-pay

## 2024-02-13 ENCOUNTER — Emergency Department (HOSPITAL_BASED_OUTPATIENT_CLINIC_OR_DEPARTMENT_OTHER)
Admission: EM | Admit: 2024-02-13 | Discharge: 2024-02-13 | Disposition: A | Discharge status: Home / Self Care | Attending: Emergency Medicine | Admitting: Emergency Medicine

## 2024-02-13 ENCOUNTER — Other Ambulatory Visit: Payer: Self-pay

## 2024-02-13 DIAGNOSIS — D2361 Other benign neoplasm of skin of right upper limb, including shoulder: Secondary | ICD-10-CM | POA: Diagnosis not present

## 2024-02-13 DIAGNOSIS — D369 Benign neoplasm, unspecified site: Secondary | ICD-10-CM

## 2024-02-13 DIAGNOSIS — K5900 Constipation, unspecified: Secondary | ICD-10-CM | POA: Insufficient documentation

## 2024-02-13 LAB — CBC WITH DIFFERENTIAL/PLATELET
Abs Immature Granulocytes: 0.02 K/uL (ref 0.00–0.07)
Basophils Absolute: 0 K/uL (ref 0.0–0.1)
Basophils Relative: 0 %
Eosinophils Absolute: 0.1 K/uL (ref 0.0–0.5)
Eosinophils Relative: 1 %
HCT: 34.7 % — ABNORMAL LOW (ref 36.0–46.0)
Hemoglobin: 11.2 g/dL — ABNORMAL LOW (ref 12.0–15.0)
Immature Granulocytes: 0 %
Lymphocytes Relative: 23 %
Lymphs Abs: 1.4 K/uL (ref 0.7–4.0)
MCH: 30 pg (ref 26.0–34.0)
MCHC: 32.3 g/dL (ref 30.0–36.0)
MCV: 93 fL (ref 80.0–100.0)
Monocytes Absolute: 0.4 K/uL (ref 0.1–1.0)
Monocytes Relative: 7 %
Neutro Abs: 4.2 K/uL (ref 1.7–7.7)
Neutrophils Relative %: 69 %
Platelets: 276 K/uL (ref 150–400)
RBC: 3.73 MIL/uL — ABNORMAL LOW (ref 3.87–5.11)
RDW: 12.7 % (ref 11.5–15.5)
WBC: 6.1 K/uL (ref 4.0–10.5)
nRBC: 0 % (ref 0.0–0.2)

## 2024-02-13 LAB — COMPREHENSIVE METABOLIC PANEL WITH GFR
ALT: 8 U/L (ref 0–44)
AST: 15 U/L (ref 15–41)
Albumin: 4.2 g/dL (ref 3.5–5.0)
Alkaline Phosphatase: 46 U/L (ref 38–126)
Anion gap: 10 (ref 5–15)
BUN: 10 mg/dL (ref 6–20)
CO2: 22 mmol/L (ref 22–32)
Calcium: 9.5 mg/dL (ref 8.9–10.3)
Chloride: 106 mmol/L (ref 98–111)
Creatinine, Ser: 0.97 mg/dL (ref 0.44–1.00)
GFR, Estimated: >60 mL/min (ref >60)
Glucose, Bld: 83 mg/dL (ref 70–99)
Potassium: 3.9 mmol/L (ref 3.5–5.1)
Sodium: 138 mmol/L (ref 135–145)
Total Bilirubin: 0.5 mg/dL (ref 0.0–1.2)
Total Protein: 7.1 g/dL (ref 6.5–8.1)

## 2024-02-13 LAB — HCG, SERUM, QUALITATIVE: Preg, Serum: NEGATIVE

## 2024-02-13 LAB — LIPASE, BLOOD: Lipase: 27 U/L (ref 11–51)

## 2024-02-13 MED ORDER — LINACLOTIDE 145 MCG PO CAPS
145.0000 ug | ORAL_CAPSULE | Freq: Every day | ORAL | 0 refills | Status: DC
Start: 2024-02-13 — End: 2024-03-21

## 2024-02-13 MED ORDER — IOHEXOL 300 MG/ML  SOLN
75.0000 mL | Freq: Once | INTRAMUSCULAR | Status: AC | PRN
  Administered 2024-02-13: 75 mL via INTRAVENOUS

## 2024-02-13 NOTE — Discharge Instructions (Addendum)
 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  Likely will require MRI for further evaluation.  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

## 2024-02-13 NOTE — ED Triage Notes (Signed)
 Report ganglion cyst on right shoulder, increasing in size, denies drainage. Also, c/o N/V/D, w/o fever.

## 2024-02-13 NOTE — ED Provider Notes (Signed)
  Physical Exam  BP 109/69   Pulse 79   Temp 99.2 F (37.3 C) (Oral)   Resp 16   Ht 5' 5 (1.651 m)   Wt 72.6 kg   SpO2 100%   BMI 26.63 kg/m   Physical Exam  Procedures  .Ultrasound ED Peripheral IV (Provider)  Date/Time: 02/13/2024 10:48 AM  Performed by: Charlyn Sora, MD Authorized by: Charlyn Sora, MD   Procedure details:    Indications: multiple failed IV attempts     Skin Prep: isopropyl alcohol     Location:  Right AC   Angiocath:  20 G   Bedside Ultrasound Guided: Yes     Images: not archived     Patient tolerated procedure without complications: Yes     Dressing applied: Yes     ED Course / MDM    Medical Decision Making Amount and/or Complexity of Data Reviewed Labs: ordered. Radiology: ordered.          Charlyn Sora, MD 02/13/24 1049

## 2024-02-13 NOTE — ED Provider Notes (Signed)
 [place] [facility] Provider Note   CSN: [id number] Arrival date & time: 02/13/24  9178     Patient presents with: Abscess   Vicki Harper is a 28 y.o. female who has 2 unassociated complaints.  Her primary complaint is of persistent constipation, states that she has been using multiple stool softeners along with stimulant and osmotic laxatives, last stools were scant, dry and hard and small in volume.  She has also had some liquid stools, has intermittent nausea however presents without any active nausea or vomiting at present.  She does endorse rectal pain, has a history of internal and external hemorrhoids last noted from colonoscopy on 23 April 2023, last visit with GI on 28 September 2023 shows that GI has advised her to increased fiber intake and fluid intake, they have her taking Dulcolax as well as using osmotic laxatives.  Her secondary complaint is of a nodule on the posterior right shoulder, previously had been told by primary care that this was a ganglion cyst, however she questions this diagnosis.  She does state that the area is tender and causes her discomfort occasionally.  There is no purulent drainage from the area, no known previous injury to this area.  Review of her previous medical history does show major depressive disorder, migraine without aura, recent surgical history of bilateral breast reduction.  Further review of GI records does show history of constipation, anal fissures, internal and external hemorrhoids.  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    Abscess      Prior to Admission medications   Medication Sig Start Date End Date Taking? Authorizing Provider  linaclotide  (LINZESS ) 145 MCG CAPS capsule Take 1 capsule (145 mcg total) by mouth daily before breakfast. 02/13/24  Yes Myriam Dorn BROCKS, PA  albuterol  (VENTOLIN  HFA) 108 (90 Base) MCG/ACT inhaler 1  puff as needed Inhalation every 4-6 hrs, cough/wheeze/shortness of breath for 90 days    [provider]  Atogepant  (QULIPTA ) 60 MG TABS Take 1 tablet (60 mg total) by mouth daily. 11/04/23   Lomax, Amy, NP  cyclobenzaprine  (FLEXERIL ) 10 MG tablet Take 1 tablet (10 mg total) by mouth 2 (two) times daily as needed for muscle spasms. 01/23/24   Elnor Jayson LABOR, DO  EPINEPHrine  0.3 mg/0.3 mL IJ SOAJ injection SMARTSIG:IM As Directed PRN 11/05/21   [provider]  FLUoxetine (PROZAC) 40 MG capsule Take 40 mg by mouth daily.    [provider]  fluticasone -salmeterol (ADVAIR) 100-50 MCG/ACT AEPB 1 puff 2 (two) times daily. 06/23/23   [provider]  guanFACINE (INTUNIV) 2 MG TB24 ER tablet Take 2 mg by mouth at bedtime.    [provider]  indomethacin  (INDOCIN ) 25 MG capsule Take 1 capsule (25 mg total) by mouth 3 (three) times daily with meals as needed for moderate pain (pain score 4-6). 01/23/24   Elnor Jayson LABOR, DO  medroxyPROGESTERone  Acetate 150 MG/ML SUSY INJECT 150 MG INTRAMUSCULARLY ONCE 08/09/23   [provider]  ondansetron  (ZOFRAN ) 4 MG tablet Take 1 tablet (4 mg total) by mouth every 8 (eight) hours as needed for up to 20 doses for nausea or vomiting. 01/10/24   Andris Estefana BRAVO, PA-C  oxyCODONE  (ROXICODONE ) 5 MG immediate release tablet Take 1 tablet (5 mg total) by mouth every 6 (six) hours as needed for up to 20 doses for severe pain (pain score 7-10). 01/10/24   Andris Estefana  E, PA-C  tiZANidine (ZANAFLEX) 2 MG tablet Take 2-4 mg by mouth at bedtime. 12/24/23   [provider]  traZODone (DESYREL) 100 MG tablet Take 100 mg by mouth at bedtime. 10/04/23   [provider]  Ubrogepant  (UBRELVY ) 100 MG TABS Take 1 tablet (100 mg total) by mouth daily as needed. 11/04/23   Lomax, Amy, NP    Allergies: Penicillins    Review of Systems  Gastrointestinal:  Positive for constipation and rectal pain.  All other systems reviewed  and are negative.   Updated Vital Signs BP 104/69 (BP Location: Left Arm)   Pulse 71   Temp 98.3 F (36.8 C) (Oral)   Resp 18   Ht 5' 5 (1.651 m)   Wt 72.6 kg   SpO2 100%   BMI 26.63 kg/m   Physical Exam Vitals and nursing note reviewed.  Constitutional:      General: She is not in acute distress.    Appearance: Normal appearance.  HENT:     Head: Normocephalic and atraumatic.     Mouth/Throat:     Mouth: Mucous membranes are moist.     Pharynx: Oropharynx is clear.  Eyes:     Extraocular Movements: Extraocular movements intact.     Conjunctiva/sclera: Conjunctivae normal.     Pupils: Pupils are equal, round, and reactive to light.  Cardiovascular:     Rate and Rhythm: Normal rate and regular rhythm.     Pulses: Normal pulses.     Heart sounds: Normal heart sounds. No murmur heard.    No friction rub. No gallop.  Pulmonary:     Effort: Pulmonary effort is normal.     Breath sounds: Normal breath sounds.  Abdominal:     General: Abdomen is flat. Bowel sounds are normal.     Palpations: Abdomen is soft.     Tenderness: There is no abdominal tenderness.     Comments: Abdomen soft, nontender, nondistended.  Musculoskeletal:        General: Normal range of motion.     Cervical back: Normal range of motion and neck supple.     Right lower leg: No edema.     Left lower leg: No edema.  Skin:    General: Skin is warm and dry.     Capillary Refill: Capillary refill takes less than 2 seconds.         Comments: On the posterior right shoulder and the mentioned area, there is an approximately 2 cm in firm nodule, mild tenderness to palpation, area is not fluctuant.  Neurological:     General: No focal deficit present.     Mental Status: She is alert. Mental status is at baseline.  Psychiatric:        Mood and Affect: Mood normal.     (all labs ordered are listed, but only abnormal results are displayed) Labs Reviewed  CBC WITH DIFFERENTIAL/PLATELET - Abnormal;  Notable for the following components:      Result Value   RBC 3.73 (*)    Hemoglobin 11.2 (*)    HCT 34.7 (*)    All other components within normal limits  COMPREHENSIVE METABOLIC PANEL WITH GFR  LIPASE, BLOOD  HCG, SERUM, QUALITATIVE    EKG: None  Radiology: CT ABDOMEN PELVIS W CONTRAST Result Date: 02/13/2024 CLINICAL DATA:  Abdominal pain. Nausea and vomiting. Suspected bowel obstruction. EXAM: CT ABDOMEN AND PELVIS WITH CONTRAST TECHNIQUE: Multidetector CT imaging of the abdomen and pelvis was performed using the standard protocol  following bolus administration of intravenous contrast. RADIATION DOSE REDUCTION: This exam was performed according to the departmental dose-optimization program which includes automated exposure control, adjustment of the mA and/or kV according to patient size and/or use of iterative reconstruction technique. CONTRAST:  75mL OMNIPAQUE  IOHEXOL  300 MG/ML  SOLN COMPARISON:  None Available. FINDINGS: Lower Chest: No acute findings. Hepatobiliary: A few tiny hepatic cysts are seen. 2 small low-attenuation lesions measuring up to 10 mm are seen in the inferior right hepatic lobe which have nonspecific characteristics, and may benign hemangiomas. Gallbladder is unremarkable. No evidence of biliary ductal dilatation. Pancreas:  No mass or inflammatory changes. Spleen: Within normal limits in size and appearance. Adrenals/Urinary Tract: No suspicious masses identified. No evidence of ureteral calculi or hydronephrosis. Stomach/Bowel: No evidence of obstruction, inflammatory process or abnormal fluid collections. Normal appendix visualized. Vascular/Lymphatic: No pathologically enlarged lymph nodes. No acute vascular findings. Reproductive:  No mass or other significant abnormality. Other:  None. Musculoskeletal:  No suspicious bone lesions identified. IMPRESSION: No acute findings. No evidence of bowel obstruction. Small indeterminate low-attenuation lesions in the inferior  right hepatic lobe, which may represent benign hemangiomas. Recommend abdomen MRI without and with contrast for further characterization. Electronically Signed   By: Norleen DELENA Kil M.D.   On: 02/13/2024 12:11     Procedures   Medications Ordered in the ED  iohexol  (OMNIPAQUE ) 300 MG/ML solution 75 mL (75 mLs Intravenous Contrast Given 02/13/24 1133)                                    Medical Decision Making Amount and/or Complexity of Data Reviewed Labs: ordered. Radiology: ordered.  Risk Prescription drug management.   Medical Decision Making:   Vicki Harper is a 28 y.o. female who presented to the ED today with 2 unassociated complaints detailed above.   Primary complaint is of abdominal discomfort along with constipation.  Secondary complaint is of a nodule on the posterior right shoulder. External chart has been reviewed including outpatient clinic records along with labs and imaging.. Patient placed on continuous vitals and telemetry monitoring while in ED which was reviewed periodically.  Complete initial physical exam performed, notably the patient  was alert and oriented in no apparent distress.  Abdomen is grossly nontender nondistended.    Reviewed and confirmed nursing documentation for past medical history, family history, social history.    Initial Assessment:   With the patient's presentation of abdominal discomfort along with nodule in the right shoulder, most likely diagnosis is respectively, regarding the shoulder dermoid cyst, consider subcutaneous abscess however exam findings are not consistent with this.  Regarding the abdomen, have a differential diagnosis which would include potential bowel obstruction, functional constipation.   Initial Plan:  Obtain CT imaging of the abdomen to exclude diagnosis of acute bowel obstruction. Screening labs including CBC and Metabolic panel to evaluate for infectious or metabolic etiology of disease.  Serum hCG to assess  for pregnancy, safety of radiographic imaging and pharmacotherapy. Objective evaluation as below reviewed   Initial Study Results:   Laboratory  All laboratory results reviewed without evidence of clinically relevant pathology.   Exceptions include: None     Radiology:  All images reviewed independently. Agree with radiology report at this time.   CT ABDOMEN PELVIS W CONTRAST Result Date: 02/13/2024 CLINICAL DATA:  Abdominal pain. Nausea and vomiting. Suspected bowel obstruction. EXAM: CT ABDOMEN AND PELVIS WITH CONTRAST TECHNIQUE:  Multidetector CT imaging of the abdomen and pelvis was performed using the standard protocol following bolus administration of intravenous contrast. RADIATION DOSE REDUCTION: This exam was performed according to the departmental dose-optimization program which includes automated exposure control, adjustment of the mA and/or kV according to patient size and/or use of iterative reconstruction technique. CONTRAST:  75mL OMNIPAQUE  IOHEXOL  300 MG/ML  SOLN COMPARISON:  None Available. FINDINGS: Lower Chest: No acute findings. Hepatobiliary: A few tiny hepatic cysts are seen. 2 small low-attenuation lesions measuring up to 10 mm are seen in the inferior right hepatic lobe which have nonspecific characteristics, and may benign hemangiomas. Gallbladder is unremarkable. No evidence of biliary ductal dilatation. Pancreas:  No mass or inflammatory changes. Spleen: Within normal limits in size and appearance. Adrenals/Urinary Tract: No suspicious masses identified. No evidence of ureteral calculi or hydronephrosis. Stomach/Bowel: No evidence of obstruction, inflammatory process or abnormal fluid collections. Normal appendix visualized. Vascular/Lymphatic: No pathologically enlarged lymph nodes. No acute vascular findings. Reproductive:  No mass or other significant abnormality. Other:  None. Musculoskeletal:  No suspicious bone lesions identified. IMPRESSION: No acute findings. No evidence  of bowel obstruction. Small indeterminate low-attenuation lesions in the inferior right hepatic lobe, which may represent benign hemangiomas. Recommend abdomen MRI without and with contrast for further characterization. Electronically Signed   By: Norleen DELENA Kil M.D.   On: 02/13/2024 12:11    Reassessment and Plan:   My evaluation for this patient is largely unremarkable and CT imaging did not demonstrate a bowel obstruction.  Along with her history of chronic constipation, multiple visits with primary care and with gastroenterology for the same, and has persistent constipation despite outpatient therapy, we will prescribe her a course of Linzess  to give her relief from chronic idiopathic constipation and possible IBS-C.  Encouraged follow-up with gastroenterology regarding this.  Further had discussion regarding her constipation, increasing fluid intake and other measures that have previously been discussed with her by gastroenterology.  There were findings on her CT that were concerning for hemangioma and recommendation for follow-up MRI.  This was discussed with the patient and she will follow-up with her primary care for further imaging regarding this finding.  Regarding her likely dermoid cyst on the right shoulder, I have given her referral to dermatology for further assessment and management.  This care plan was explained planed to the patient thoroughly, she understands and agrees has no further concerns at this time.  As she has no concerning signs or symptoms at this time, has had normal vital signs, and CT imaging does not demonstrate a an acute bowel obstruction, find she is stable for discharge and outpatient management at this time.       Final diagnoses:  Constipation, unspecified constipation type  Dermoid cyst    ED Discharge Orders          Ordered    linaclotide  (LINZESS ) 145 MCG CAPS capsule  Daily before breakfast        02/13/24 1240               Myriam Dorn BROCKS, [place] 02/13/24 1445    Charlyn Sora, MD 02/14/24 [phone]

## 2024-02-13 NOTE — ED Notes (Signed)
 ED Provider at bedside.

## 2024-02-14 ENCOUNTER — Ambulatory Visit (INDEPENDENT_AMBULATORY_CARE_PROVIDER_SITE_OTHER): Admitting: Plastic Surgery

## 2024-02-14 ENCOUNTER — Encounter: Payer: Self-pay | Admitting: Plastic Surgery

## 2024-02-14 VITALS — BP 100/66 | HR 86 | Ht 66.0 in | Wt 160.0 lb

## 2024-02-14 DIAGNOSIS — Z9889 Other specified postprocedural states: Secondary | ICD-10-CM

## 2024-02-14 NOTE — Progress Notes (Signed)
 Patient is status post bilateral breast reduction.  She is no longer taking pain medication and is comfortable.  No fevers chills or other concerns.  On examination she has an outstanding result.  She has very nice symmetry very nice shape.  The incisions are clean dry and intact.  Nipples are warm and well-perfused.  She may begin use of Vaseline to remove the remainder of the skin glue.  She may begin scar massage to improve the appearance of the incisions.  She may change to a sports bra as desired.  She may sleep in any position that she likes.  Begin slowly increase activity as tolerated.   Follow-up in 1 to 2 months, postop pictures at that time.

## 2024-02-15 ENCOUNTER — Telehealth: Payer: Self-pay | Admitting: Plastic Surgery

## 2024-02-15 ENCOUNTER — Encounter: Payer: Self-pay | Admitting: Plastic Surgery

## 2024-02-15 NOTE — Telephone Encounter (Signed)
 Patient return your call, but you had left for surgery

## 2024-02-15 NOTE — Telephone Encounter (Signed)
 I called the patient in regards to her MyChart message about returning to work.  I reviewed postoperative note from yesterday, patient appears to be doing well postoperatively.  Patient states that she has been feeling well and doing well since surgery.  I discussed with the patient that she may return to work now light duty and should be light duty until October 3.  As of October 3, she will not have any more restrictions.  She expressed understanding.  I discussed with the patient that she may call our office if she has any further questions or concerns about anything.

## 2024-02-18 ENCOUNTER — Telehealth: Payer: Self-pay | Admitting: Plastic Surgery

## 2024-02-18 NOTE — Telephone Encounter (Signed)
 Pt just called and wanted to be seen by CE or Dr Waddell for swelling  L breast pt said it has some swelling and she wanted it looked at, CE did not have a spot on Monday so book with Dr Waddell  PostOP: bilateral breast reduction / ins / sx 02-04-24 [facility] 10:15am

## 2024-02-21 ENCOUNTER — Ambulatory Visit (INDEPENDENT_AMBULATORY_CARE_PROVIDER_SITE_OTHER): Admitting: Plastic Surgery

## 2024-02-21 VITALS — BP 106/70 | HR 84

## 2024-02-21 DIAGNOSIS — Z9889 Other specified postprocedural states: Secondary | ICD-10-CM

## 2024-02-21 NOTE — Progress Notes (Signed)
 [name] returns today for evaluation.  She underwent an uncomplicated bilateral breast reduction.  Over the weekend she felt that she had a little bit of firmness in the left breast and noted some bruising.  She returns today for evaluation.  On examination I do not see any difference from her last visit other than some mild discoloration in the lower pole of the left breast.  She still has very nice shape and symmetry.  The breasts are soft and the nipples are warm and well-perfused.  Patient is reassured.  She may have had a little bit of bleeding over the weekend but this was not significant.  A couple of sutures are trimmed today.  Photographs were obtained today with her consent.  She will keep her scheduled appointment.

## 2024-02-23 ENCOUNTER — Encounter: Admitting: Student

## 2024-02-23 ENCOUNTER — Other Ambulatory Visit: Payer: Self-pay | Admitting: Physician Assistant

## 2024-02-23 DIAGNOSIS — R935 Abnormal findings on diagnostic imaging of other abdominal regions, including retroperitoneum: Secondary | ICD-10-CM

## 2024-02-28 ENCOUNTER — Encounter: Payer: Self-pay | Admitting: Plastic Surgery

## 2024-02-28 ENCOUNTER — Ambulatory Visit (INDEPENDENT_AMBULATORY_CARE_PROVIDER_SITE_OTHER)

## 2024-02-28 VITALS — BP 115/76 | HR 66 | Ht 66.0 in | Wt 161.4 lb

## 2024-02-28 DIAGNOSIS — Z09 Encounter for follow-up examination after completed treatment for conditions other than malignant neoplasm: Secondary | ICD-10-CM

## 2024-02-28 NOTE — Progress Notes (Signed)
   Established Patient Office Visit  Subjective   Patient ID: Vicki Harper, female    DOB: 05/12/1996  Age: 28 y.o. MRN: [id number]   [name] returns today for evaluation.  She underwent an uncomplicated bilateral breast reduction with Dr. Waddell on 8/29.  Over the weekend she felt that she had a little bit of firmness in the left breast and noted some discharge at the edge of the medial areola and serosanguinous fluid. Called over the weekend and was instructed to return today for evaluation.   BP 115/76   Pulse 66   Ht 5' 6 (1.676 m)   Wt 161 lb 6 oz (73.2 kg)   SpO2 99%   BMI 26.05 kg/m   On examination discharge noticed on the left medial areola, opened and 5 cc of serosanguinous fluid drained. Wound packed. Removed some spitting sutures from IMF incision and vertical incision. She still has very nice shape and symmetry.  The breasts are soft and the nipples are warm and well-perfused. No signs of infection.    Patient is reassured.  Start to pack wound daily. Follow up with Dr. Waddell on Monday.  Shene Maxfield M. Sayge Brienza, MD [facility]

## 2024-02-29 ENCOUNTER — Telehealth: Payer: Self-pay | Admitting: Student

## 2024-02-29 NOTE — Telephone Encounter (Signed)
 Patient called the on-call service last night stating that at her appointment yesterday, she thought that she could pack her wound, but states that she realized later that she cannot do it herself.  I discussed this with Dr. Montorfano who saw the patient yesterday and he was okay with patient not packing the wound.  I recommended that patient remove the packing today and apply a small amount of Vaseline over the wound followed by dressing daily.  I discussed with her that I would like her to continue to monitor her wound.  She states that she has an appointment on Monday.  Recommend she keep that appointment.  I discussed with her to call if she has any questions or concerns about anything.

## 2024-03-03 ENCOUNTER — Encounter: Admitting: Student

## 2024-03-06 ENCOUNTER — Other Ambulatory Visit: Payer: Self-pay

## 2024-03-06 ENCOUNTER — Encounter

## 2024-03-06 ENCOUNTER — Emergency Department (HOSPITAL_BASED_OUTPATIENT_CLINIC_OR_DEPARTMENT_OTHER): Admission: EM | Admit: 2024-03-06 | Discharge: 2024-03-06 | Disposition: A | Discharge status: Home / Self Care

## 2024-03-06 ENCOUNTER — Encounter: Admitting: Surgical

## 2024-03-06 ENCOUNTER — Encounter (HOSPITAL_BASED_OUTPATIENT_CLINIC_OR_DEPARTMENT_OTHER): Payer: Self-pay

## 2024-03-06 ENCOUNTER — Encounter: Payer: Self-pay | Admitting: Surgical

## 2024-03-06 ENCOUNTER — Ambulatory Visit: Admitting: Surgical

## 2024-03-06 VITALS — BP 109/74 | HR 96 | Ht 66.0 in | Wt 160.0 lb

## 2024-03-06 DIAGNOSIS — R519 Headache, unspecified: Secondary | ICD-10-CM | POA: Insufficient documentation

## 2024-03-06 DIAGNOSIS — Z9889 Other specified postprocedural states: Secondary | ICD-10-CM

## 2024-03-06 DIAGNOSIS — Z09 Encounter for follow-up examination after completed treatment for conditions other than malignant neoplasm: Secondary | ICD-10-CM

## 2024-03-06 LAB — CBC WITH DIFFERENTIAL/PLATELET
Abs Immature Granulocytes: 0.01 K/uL (ref 0.00–0.07)
Basophils Absolute: 0 K/uL (ref 0.0–0.1)
Basophils Relative: 1 %
Eosinophils Absolute: 0.1 K/uL (ref 0.0–0.5)
Eosinophils Relative: 1 %
HCT: 35 % — ABNORMAL LOW (ref 36.0–46.0)
Hemoglobin: 11.4 g/dL — ABNORMAL LOW (ref 12.0–15.0)
Immature Granulocytes: 0 %
Lymphocytes Relative: 43 %
Lymphs Abs: 1.9 K/uL (ref 0.7–4.0)
MCH: 30.6 pg (ref 26.0–34.0)
MCHC: 32.6 g/dL (ref 30.0–36.0)
MCV: 93.8 fL (ref 80.0–100.0)
Monocytes Absolute: 0.4 K/uL (ref 0.1–1.0)
Monocytes Relative: 8 %
Neutro Abs: 2.1 K/uL (ref 1.7–7.7)
Neutrophils Relative %: 47 %
Platelets: 265 K/uL (ref 150–400)
RBC: 3.73 MIL/uL — ABNORMAL LOW (ref 3.87–5.11)
RDW: 13.1 % (ref 11.5–15.5)
WBC: 4.5 K/uL (ref 4.0–10.5)
nRBC: 0 % (ref 0.0–0.2)

## 2024-03-06 LAB — BASIC METABOLIC PANEL WITH GFR
Anion gap: 9 (ref 5–15)
BUN: 15 mg/dL (ref 6–20)
CO2: 23 mmol/L (ref 22–32)
Calcium: 9.1 mg/dL (ref 8.9–10.3)
Chloride: 105 mmol/L (ref 98–111)
Creatinine, Ser: 1.02 mg/dL — ABNORMAL HIGH (ref 0.44–1.00)
GFR, Estimated: >60 mL/min (ref >60)
Glucose, Bld: 91 mg/dL (ref 70–99)
Potassium: 4.1 mmol/L (ref 3.5–5.1)
Sodium: 137 mmol/L (ref 135–145)

## 2024-03-06 LAB — HCG, SERUM, QUALITATIVE: Preg, Serum: NEGATIVE

## 2024-03-06 MED ORDER — SODIUM CHLORIDE 0.9 % IV BOLUS
1000.0000 mL | Freq: Once | INTRAVENOUS | Status: AC
  Administered 2024-03-06: 1000 mL via INTRAVENOUS

## 2024-03-06 MED ORDER — PROCHLORPERAZINE EDISYLATE 10 MG/2ML IJ SOLN
10.0000 mg | Freq: Once | INTRAMUSCULAR | Status: AC
  Administered 2024-03-06: 10 mg via INTRAVENOUS
  Filled 2024-03-06: qty 2

## 2024-03-06 MED ORDER — DIPHENHYDRAMINE HCL 50 MG/ML IJ SOLN
12.5000 mg | Freq: Once | INTRAMUSCULAR | Status: AC
  Administered 2024-03-06: 12.5 mg via INTRAVENOUS
  Filled 2024-03-06: qty 1

## 2024-03-06 MED ORDER — KETOROLAC TROMETHAMINE 15 MG/ML IJ SOLN
15.0000 mg | Freq: Once | INTRAMUSCULAR | Status: AC
  Administered 2024-03-06: 15 mg via INTRAVENOUS
  Filled 2024-03-06: qty 1

## 2024-03-06 NOTE — ED Triage Notes (Signed)
 Pt coming from home with reports of migraines for past 3 weeks. Photophobia reported. Associated nausea, denies vomiting. NAD noted in triage. Hx of migraines, feels same as previous.

## 2024-03-06 NOTE — Discharge Instructions (Signed)
 It was a pleasure taking care of you  Make sure to follow-up with your neurologist  Return for new or worsening symptoms

## 2024-03-06 NOTE — Progress Notes (Signed)
 Patient is a 28 y.o.-year-old female status post bilateral breast reduction with Dr.  Waddell. Patient is 4 weeks postop.  Pain was here on 02/29/2024, had a little bit of firmness to the left breast and some discharge and the area was opened and about 5 cc of fluid was expressed.  This area has subsequently healed and she is here today for reevaluation.  She is not having any issues with this area.  She does have questions about some hypopigmentation of the left T-junction.  Chaperone present on exam Bilateral NAC's are viable, bilateral breast incisions are intact. There is no erythema or cellulitic changes noted. No obvious subcutaneous fluid collections noted with palpation.    A/P:  Patient has healed very well, there is no signs infection or concern on exam.  We discussed returning to work around October 14th.  She will have restrictions of no lifting greater than 15 to 20 pounds until then.  We discussed increasing activity as tolerated up to 20 pounds at this time.  Recommend starting massage and scar creams at least 1-2 times per day.  Recommend continuing with compressive garment 24/7 until 6 weeks post-op,  avoiding strenuous activity/heavy lifting until 6 weeks post-op  Recommend following up as needed  Pictures were obtained of the patient and placed in the chart with the patient's or guardian's permission.   All of the patient's questions were answered to their content. Recommend calling with any questions or concerns.

## 2024-03-06 NOTE — ED Provider Notes (Signed)
 [facility] Provider Note   CSN: [id number] Arrival date & time: 03/06/24  1813     Patient presents with: Migraine   Vicki Harper is a 28 y.o. female here for evaluation of HA.  States she has had chronic daily headache over the last month or so.  She is followed with Greig Forbes with neurology.  Taking Qulipta  and Tylenol  without relief.  Symptoms are unchanged.  Feels a little bit lightheaded.  No sudden onset thunderclap headache.  No vision changes, numbness, weakness.  No fever, neck pain.  Some mild nausea without vomiting.  That she typically does well with migraine cocktail.   HPI     Prior to Admission medications   Medication Sig Start Date End Date Taking? Authorizing Provider  albuterol  (VENTOLIN  HFA) 108 (90 Base) MCG/ACT inhaler 1 puff as needed Inhalation every 4-6 hrs, cough/wheeze/shortness of breath for 90 days    [provider]  Atogepant  (QULIPTA ) 60 MG TABS Take 1 tablet (60 mg total) by mouth daily. 11/04/23   Lomax, Amy, NP  cyclobenzaprine  (FLEXERIL ) 10 MG tablet Take 1 tablet (10 mg total) by mouth 2 (two) times daily as needed for muscle spasms. 01/23/24   Elnor Jayson LABOR, DO  EPINEPHrine  0.3 mg/0.3 mL IJ SOAJ injection SMARTSIG:IM As Directed PRN 11/05/21   [provider]  FLUoxetine (PROZAC) 40 MG capsule Take 40 mg by mouth daily.    [provider]  fluticasone -salmeterol (ADVAIR) 100-50 MCG/ACT AEPB 1 puff 2 (two) times daily. 06/23/23   [provider]  guanFACINE (INTUNIV) 2 MG TB24 ER tablet Take 2 mg by mouth at bedtime.    [provider]  indomethacin  (INDOCIN ) 25 MG capsule Take 1 capsule (25 mg total) by mouth 3 (three) times daily with meals as needed for moderate pain (pain score 4-6). 01/23/24   Elnor Jayson LABOR, DO  linaclotide  (LINZESS ) 145 MCG CAPS capsule Take 1 capsule (145 mcg total) by mouth daily before breakfast. 02/13/24   Myriam Dorn BROCKS, PA   medroxyPROGESTERone  Acetate 150 MG/ML SUSY INJECT 150 MG INTRAMUSCULARLY ONCE 08/09/23   [provider]  ondansetron  (ZOFRAN ) 4 MG tablet Take 1 tablet (4 mg total) by mouth every 8 (eight) hours as needed for up to 20 doses for nausea or vomiting. 01/10/24   Andris Estefana BRAVO, PA-C  oxyCODONE  (ROXICODONE ) 5 MG immediate release tablet Take 1 tablet (5 mg total) by mouth every 6 (six) hours as needed for up to 20 doses for severe pain (pain score 7-10). 01/10/24   Andris Estefana BRAVO, PA-C  tiZANidine (ZANAFLEX) 2 MG tablet Take 2-4 mg by mouth at bedtime. 12/24/23   [provider]  traZODone (DESYREL) 100 MG tablet Take 100 mg by mouth at bedtime. 10/04/23   [provider]  Ubrogepant  (UBRELVY ) 100 MG TABS Take 1 tablet (100 mg total) by mouth daily as needed. 11/04/23   Lomax, Amy, NP    Allergies: Penicillins    Review of Systems  Constitutional: Negative.   HENT: Negative.    Respiratory: Negative.    Cardiovascular: Negative.   Gastrointestinal: Negative.   Genitourinary: Negative.   Musculoskeletal: Negative.   Skin: Negative.   Neurological:  Positive for light-headedness and headaches. Negative for dizziness, tremors, seizures, syncope, facial asymmetry, speech difficulty, weakness and numbness.  All other systems reviewed and are negative.  Updated Vital Signs BP 105/74   Pulse (!) 57   Temp 98.2 F (36.8 C) (  Oral)   Resp 18   Ht 5' 6 (1.676 m)   Wt 72.6 kg   SpO2 99%   BMI 25.82 kg/m   Physical Exam Physical Exam  Constitutional: Pt is oriented to person, place, and time. Pt appears well-developed and well-nourished. No distress.  HENT:  Head: Normocephalic and atraumatic.  Mouth/Throat: Oropharynx is clear and moist.  Eyes: Conjunctivae and EOM are normal. Pupils are equal, round, and reactive to light. No scleral icterus.  No horizontal, vertical or rotational nystagmus  Neck: Normal range of motion. Neck supple.  Full active and passive  ROM without pain No midline or paraspinal tenderness No nuchal rigidity or meningeal signs  Cardiovascular: Normal rate, regular rhythm and intact distal pulses.   Pulmonary/Chest: Effort normal and breath sounds normal. No respiratory distress. Pt has no wheezes. No rales.  Abdominal: Soft. Bowel sounds are normal. There is no tenderness. There is no rebound and no guarding.  Musculoskeletal: Normal range of motion.  Lymphadenopathy:    No cervical adenopathy.  Neurological: Pt. is alert and oriented to person, place, and time. He has normal reflexes. No cranial nerve deficit.  Exhibits normal muscle tone. Coordination normal.  Mental Status:  Alert, oriented, thought content appropriate. Speech fluent without evidence of aphasia. Able to follow 2 step commands without difficulty.  Cranial Nerves:  2-12 grossly intact Motor:  Equal strength Sensory: Pinprick and light touch normal in all extremities.  DTR- neg Cerebellar: normal F2N Gait: normal gait and balance CV: distal pulses palpable throughout   Skin: Skin is warm and dry. No rash noted. Pt is not diaphoretic.  Psychiatric: Pt has a normal mood and affect. Behavior is normal. Judgment and thought content normal.  Nursing note and vitals reviewed.  (all labs ordered are listed, but only abnormal results are displayed) Labs Reviewed  CBC WITH DIFFERENTIAL/PLATELET - Abnormal; Notable for the following components:      Result Value   RBC 3.73 (*)    Hemoglobin 11.4 (*)    HCT 35.0 (*)    All other components within normal limits  BASIC METABOLIC PANEL WITH GFR - Abnormal; Notable for the following components:   Creatinine, Ser 1.02 (*)    All other components within normal limits  HCG, SERUM, QUALITATIVE    EKG: None  Radiology: No results found.   Procedures   Medications Ordered in the ED  prochlorperazine  (COMPAZINE ) injection 10 mg (10 mg Intravenous Given 03/06/24 2119)  diphenhydrAMINE  (BENADRYL ) injection  12.5 mg (12.5 mg Intravenous Given 03/06/24 2117)  ketorolac  (TORADOL ) 15 MG/ML injection 15 mg (15 mg Intravenous Given 03/06/24 2123)  sodium chloride  0.9 % bolus 1,000 mL (1,000 mLs Intravenous New Bag/Given 03/06/24 334)     28 year old here for evaluation of headache.  Has had persistent headache over the last month.  Seen 01/21/24 for similar heads head CT which is negative.  Headaches feel similar to her prior migraines.  Here she has nonfocal neuroexam.  Will plan on checking basic labs, migraine cocktail, reassess  Labs personally viewed and interpreted: CBC without leukocytosis Metabolic panel without significant Pregnancy test negative  Reassessed after migraine cocktail.  Headache improved.  Will have her follow-up with her neurology team.  She will return for new or worsening symptoms.  Low suspicion for sah, iih, mass, infectious process, trigeminal neuralgia, acute angle glaucoma, CVA, dissection, demyelinating process, acute angle glaucoma, traumatic injury, meningitis, electrolyte abnormality, toxidrome.  The patient has been appropriately medically screened and/or stabilized in the ED.  I have low suspicion for any other emergent medical condition which would require further screening, evaluation or treatment in the ED or require inpatient management.  Patient is hemodynamically stable and in no acute distress.  Patient able to ambulate in department prior to ED.  Evaluation does not show acute pathology that would require ongoing or additional emergent interventions while in the emergency department or further inpatient treatment.  I have discussed the diagnosis with the patient and answered all questions.  Pain is been managed while in the emergency department and patient has no further complaints prior to discharge.  Patient is comfortable with plan discussed in room and is stable for discharge at this time.  I have discussed strict return precautions for returning to the  emergency department.  Patient was encouraged to follow-up with PCP/specialist refer to at discharge.                                    Medical Decision Making Amount and/or Complexity of Data Reviewed External Data Reviewed: labs, radiology and notes. Labs: ordered. Decision-making details documented in ED Course.  Risk OTC drugs. Prescription drug management. Decision regarding hospitalization. Diagnosis or treatment significantly limited by social determinants of health.        Final diagnoses:  Acute nonintractable headache, unspecified headache type    ED Discharge Orders     None          Roshun Klingensmith A, PA-C 03/06/24 2316    Simon Lavonia SAILOR, MD 03/06/24 2356

## 2024-03-08 ENCOUNTER — Encounter: Payer: Self-pay | Admitting: Family Medicine

## 2024-03-09 NOTE — Telephone Encounter (Signed)
 Called pt and scheduled for 10/8 @ 2:00 pm.

## 2024-03-10 ENCOUNTER — Emergency Department (HOSPITAL_BASED_OUTPATIENT_CLINIC_OR_DEPARTMENT_OTHER)
Admission: EM | Admit: 2024-03-10 | Discharge: 2024-03-10 | Discharge status: Against Medical Advice | Attending: Emergency Medicine | Admitting: Emergency Medicine

## 2024-03-10 ENCOUNTER — Encounter (HOSPITAL_BASED_OUTPATIENT_CLINIC_OR_DEPARTMENT_OTHER): Payer: Self-pay | Admitting: Emergency Medicine

## 2024-03-10 ENCOUNTER — Other Ambulatory Visit: Payer: Self-pay

## 2024-03-10 DIAGNOSIS — Z5321 Procedure and treatment not carried out due to patient leaving prior to being seen by health care provider: Secondary | ICD-10-CM | POA: Diagnosis not present

## 2024-03-10 DIAGNOSIS — G43909 Migraine, unspecified, not intractable, without status migrainosus: Secondary | ICD-10-CM | POA: Insufficient documentation

## 2024-03-10 DIAGNOSIS — R079 Chest pain, unspecified: Secondary | ICD-10-CM | POA: Diagnosis not present

## 2024-03-10 NOTE — ED Triage Notes (Signed)
 Pt reports migraine since last night- hot flashes, dizziness, blurred vision, chest pain, reports similar to previous migraines.   Taken qulipta , no relief.

## 2024-03-12 ENCOUNTER — Ambulatory Visit
Admission: RE | Admit: 2024-03-12 | Discharge: 2024-03-12 | Disposition: A | Discharge status: Home / Self Care | Source: Ambulatory Visit | Attending: Physician Assistant | Admitting: Physician Assistant

## 2024-03-12 DIAGNOSIS — R935 Abnormal findings on diagnostic imaging of other abdominal regions, including retroperitoneum: Secondary | ICD-10-CM

## 2024-03-12 MED ORDER — GADOPICLENOL 0.5 MMOL/ML IV SOLN
7.0000 mL | Freq: Once | INTRAVENOUS | Status: AC | PRN
  Administered 2024-03-12: 7 mL via INTRAVENOUS

## 2024-03-13 ENCOUNTER — Other Ambulatory Visit: Payer: Self-pay | Admitting: Obstetrics and Gynecology

## 2024-03-13 DIAGNOSIS — D069 Carcinoma in situ of cervix, unspecified: Secondary | ICD-10-CM

## 2024-03-13 NOTE — Progress Notes (Signed)
OR orders entered 

## 2024-03-14 NOTE — Patient Instructions (Signed)
 Below is our plan:  We will continue Qulipta  daily. Continue Ubrelvy  as needed. We will start medrol dose pack. Try Reglan  for nausea. Do not take this regularly.   Please make sure you are staying well hydrated. I recommend 50-60 ounces daily. Well balanced diet and regular exercise encouraged. Consistent sleep schedule with 6-8 hours recommended.   Please continue follow up with care team as directed.   Follow up with me in 8 months   You may receive a survey regarding today's visit. I encourage you to leave honest feed back as I do use this information to improve patient care. Thank you for seeing me today!   GENERAL HEADACHE INFORMATION:   Natural supplements: Magnesium Oxide or Magnesium Glycinate 500 mg at bed (up to 800 mg daily) Coenzyme Q10 300 mg in AM Vitamin B2- 200 mg twice a day   Add 1 supplement at a time since even natural supplements can have undesirable side effects. You can sometimes buy supplements cheaper (especially Coenzyme Q10) at www.WebmailGuide.co.za or at [facility].  Migraine with aura: There is increased risk for stroke in women with migraine with aura and a contraindication for the combined contraceptive pill for use by women who have migraine with aura. The risk for women with migraine without aura is lower. However other risk factors like smoking are far more likely to increase stroke risk than migraine. There is a recommendation for no smoking and for the use of OCPs without estrogen such as progestogen only pills particularly for women with migraine with aura.SABRA People who have migraine headaches with auras may be 3 times more likely to have a stroke caused by a blood clot, compared to migraine patients who don't see auras. Women who take hormone-replacement therapy may be 30 percent more likely to suffer a clot-based stroke than women not taking medication containing estrogen. Other risk factors like smoking and high blood pressure may be  much more important.     Vitamins and herbs that show potential:   Magnesium: Magnesium (250 mg twice a day or 500 mg at bed) has a relaxant effect on smooth muscles such as blood vessels. Individuals suffering from frequent or daily headache usually have low magnesium levels which can be increase with daily supplementation of 400-750 mg. Three trials found 40-90% average headache reduction  when used as a preventative. Magnesium may help with headaches are aura, the best evidence for magnesium is for migraine with aura is its thought to stop the cortical spreading depression we believe is the pathophysiology of migraine aura.Magnesium also demonstrated the benefit in menstrually related migraine.  Magnesium is part of the messenger system in the serotonin cascade and it is a good muscle relaxant.  It is also useful for constipation which can be a side effect of other medications used to treat migraine. Good sources include nuts, whole grains, and tomatoes. Side Effects: loose stool/diarrhea  Riboflavin (vitamin B 2) 200 mg twice a day. This vitamin assists nerve cells in the production of ATP a principal energy storing molecule.  It is necessary for many chemical reactions in the body.  There have been at least 3 clinical trials of riboflavin using 400 mg per day all of which suggested that migraine frequency can be decreased.  All 3 trials showed significant improvement in over half of migraine sufferers.  The supplement is found in bread, cereal, milk, meat, and poultry.  Most Americans get more riboflavin than the recommended daily allowance, however riboflavin deficiency is not necessary  for the supplements to help prevent headache. Side effects: energizing, green urine   Coenzyme Q10: This is present in almost all cells in the body and is critical component for the conversion of energy.  Recent studies have shown that a nutritional supplement of CoQ10 can reduce the frequency of migraine attacks by improving the energy  production of cells as with riboflavin.  Doses of 150 mg twice a day have been shown to be effective.   Melatonin: Increasing evidence shows correlation between melatonin secretion and headache conditions.  Melatonin supplementation has decreased headache intensity and duration.  It is widely used as a sleep aid.  Sleep is natures way of dealing with migraine.  A dose of 3 mg is recommended to start for headaches including cluster headache. Higher doses up to 15 mg has been reviewed for use in Cluster headache and have been used. The rationale behind using melatonin for cluster is that many theories regarding the cause of Cluster headache center around the disruption of the normal circadian rhythm in the brain.  This helps restore the normal circadian rhythm.   HEADACHE DIET: Foods and beverages which may trigger migraine Note that only 20% of headache patients are food sensitive. You will know if you are food sensitive if you get a headache consistently 20 minutes to 2 hours after eating a certain food. Only cut out a food if it causes headaches, otherwise you might remove foods you enjoy! What matters most for diet is to eat a well balanced healthy diet full of vegetables and low fat protein, and to not miss meals.   Chocolate, other sweets ALL cheeses except cottage and cream cheese Dairy products, yogurt, sour cream, ice cream Liver Meat extracts (Bovril, Marmite, meat tenderizers) Meats or fish which have undergone aging, fermenting, pickling or smoking. These include: Hotdogs,salami,Lox,sausage, mortadellas,smoked salmon, pepperoni, Pickled herring Pods of broad bean (English beans, Chinese pea pods, [place] (fava) beans, lima and navy beans Ripe avocado, ripe banana Yeast extracts or active yeast preparations such as Brewer's or Fleishman's (commercial bakes goods are permitted) Tomato based foods, pizza (lasagna, etc.)   MSG (monosodium glutamate) is disguised as many things; look for  these common aliases: Monopotassium glutamate Autolysed yeast Hydrolysed protein Sodium caseinate "flavorings" "all natural preservatives Nutrasweet   Avoid all other foods that convincingly provoke headaches.   Resources: The Dizzy Bluford Aid Your Headache Diet, migrainestrong.com  https://zamora-andrews.com/   Caffeine  and Migraine For patients that have migraine, caffeine  intake more than 3 days per week can lead to dependency and increased migraine frequency. I would recommend cutting back on your caffeine  intake as best you can. The recommended amount of caffeine  is 200-300 mg daily, although migraine patients may experience dependency at even lower doses. While you may notice an increase in headache temporarily, cutting back will be helpful for headaches in the long run. For more information on caffeine  and migraine, visit: https://americanmigrainefoundation.org/resource-library/caffeine -and-migraine/   Headache Prevention Strategies:   1. Maintain a headache diary; learn to identify and avoid triggers.  - This can be a simple note where you log when you had a headache, associated symptoms, and medications used - There are several smartphone apps developed to help track migraines: Migraine Buddy, Migraine Monitor, Curelator N1-Headache App   Common triggers include: Emotional triggers: Emotional/Upset family or friends Emotional/Upset occupation Business reversal/success Anticipation anxiety Crisis-serious Post-crisis periodNew job/position   Physical triggers: Vacation Day Weekend Strenuous Exercise High Altitude Location New Move Menstrual Day Physical Illness Oversleep/Not enough sleep Weather  changes Light: Photophobia or light sesnitivity treatment involves a balance between desensitization and reduction in overly strong input. Use dark polarized glasses outside, but not inside. Avoid bright or fluorescent light, but  do not dim environment to the point that going into a normally lit room hurts. Consider FL-41 tint lenses, which reduce the most irritating wavelengths without blocking too much light.  These can be obtained at axonoptics.com or theraspecs.com Foods: see list above.   2. Limit use of acute treatments (over-the-counter medications, triptans, etc.) to no more than 2 days per week or 10 days per month to prevent medication overuse headache (rebound headache).     3. Follow a regular schedule (including weekends and holidays): Don't skip meals. Eat a balanced diet. 8 hours of sleep nightly. Minimize stress. Exercise 30 minutes per day. Being overweight is associated with a 5 times increased risk of chronic migraine. Keep well hydrated and drink 6-8 glasses of water per day.   4. Initiate non-pharmacologic measures at the earliest onset of your headache. Rest and quiet environment. Relax and reduce stress. Breathe2Relax is a free app that can instruct you on    some simple relaxtion and breathing techniques. Http://Dawnbuse.com is a    free website that provides teaching videos on relaxation.  Also, there are  many apps that   can be downloaded for "mindful" relaxation.  An app called YOGA NIDRA will help walk you through mindfulness. Another app called Calm can be downloaded to give you a structured mindfulness guide with daily reminders and skill development. Headspace for guided meditation Mindfulness Based Stress Reduction Online Course: www.palousemindfulness.com Cold compresses.   5. Don't wait!! Take the maximum allowable dosage of prescribed medication at the first sign of migraine.   6. Compliance:  Take prescribed medication regularly as directed and at the first sign of a migraine.   7. Communicate:  Call your physician when problems arise, especially if your headaches change, increase in frequency/severity, or become associated with neurological symptoms (weakness, numbness, slurred  speech, etc.). Proceed to emergency room if you experience new or worsening symptoms or symptoms do not resolve, if you have new neurologic symptoms or if headache is severe, or for any concerning symptom.   8. Headache/pain management therapies: Consider various complementary methods, including medication, behavioral therapy, psychological counselling, biofeedback, massage therapy, acupuncture, dry needling, and other modalities.  Such measures may reduce the need for medications. Counseling for pain management, where patients learn to function and ignore/minimize their pain, seems to work very well.   9. Recommend changing family's attention and focus away from patient's headaches. Instead, emphasize daily activities. If first question of day is 'How are your headaches/Do you have a headache today?', then patient will constantly think about headaches, thus making them worse. Goal is to re-direct attention away from headaches, toward daily activities and other distractions.   10. Helpful Websites: www.AmericanHeadacheSociety.org PatentHood.ch www.headaches.org TightMarket.nl www.achenet.org
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
## 2024-03-15 ENCOUNTER — Ambulatory Visit: Admitting: Family Medicine

## 2024-03-15 ENCOUNTER — Encounter: Payer: Self-pay | Admitting: Family Medicine

## 2024-03-15 VITALS — BP 115/73 | HR 97 | Ht 66.0 in | Wt 164.5 lb

## 2024-03-15 DIAGNOSIS — Z9889 Other specified postprocedural states: Secondary | ICD-10-CM

## 2024-03-15 DIAGNOSIS — G43009 Migraine without aura, not intractable, without status migrainosus: Secondary | ICD-10-CM

## 2024-03-15 DIAGNOSIS — R110 Nausea: Secondary | ICD-10-CM

## 2024-03-15 MED ORDER — METHYLPREDNISOLONE 4 MG PO TBPK: ORAL_TABLET | ORAL | 0 refills | Status: DC

## 2024-03-15 MED ORDER — UBRELVY 100 MG PO TABS: 100.0000 mg | ORAL_TABLET | Freq: Every day | ORAL | 11 refills | Status: AC | PRN

## 2024-03-15 MED ORDER — METOCLOPRAMIDE HCL 5 MG PO TABS: 5.0000 mg | ORAL_TABLET | Freq: Three times a day (TID) | ORAL | 0 refills | Status: AC | PRN

## 2024-03-17 ENCOUNTER — Other Ambulatory Visit: Payer: Self-pay | Admitting: Medical Genetics

## 2024-03-17 ENCOUNTER — Telehealth: Payer: Self-pay | Admitting: Pharmacist

## 2024-03-17 DIAGNOSIS — Z006 Encounter for examination for normal comparison and control in clinical research program: Secondary | ICD-10-CM

## 2024-03-17 NOTE — Telephone Encounter (Signed)
 Pharmacy Patient Advocate Encounter   Received notification from Patient Pharmacy that prior authorization for Ubrelvy  100MG  tablets is required/requested.   Insurance verification completed.   The patient is insured through [facility].   Per test claim: PA required; PA submitted to above mentioned insurance via Latent Key/confirmation #/EOC [id number] Status is pending

## 2024-03-20 ENCOUNTER — Encounter: Payer: Self-pay | Admitting: Obstetrics and Gynecology

## 2024-03-20 ENCOUNTER — Other Ambulatory Visit: Payer: Self-pay

## 2024-03-20 ENCOUNTER — Encounter (HOSPITAL_COMMUNITY): Payer: Self-pay | Admitting: Obstetrics and Gynecology

## 2024-03-20 NOTE — Progress Notes (Signed)
 SDW CALL  Patient was given pre-op instructions over the phone. The opportunity was given for the patient to ask questions. No further questions asked. Patient verbalized understanding of instructions given.   PCP - Rosina Amy at [facility] - denies - but was recently seen by Dr. Ladona - no followup needed  PPM/ICD - denies   Chest x-ray - 01/23/24 EKG - 01/23/24 Stress Test - denies ECHO - denies Cardiac Cath - denies  Sleep Study - denies  No DM  Last dose of GLP1 agonist-  n/a GLP1 instructions:  n/a  Blood Thinner Instructions: n/a Aspirin Instructions:  n/a  ERAS Protcol - NPO PRE-SURGERY Ensure or G2- n/a  COVID TEST- n/a   Anesthesia review: no  Patient denies shortness of breath, fever, cough and chest pain over the phone call   All instructions explained to the patient, with a verbal understanding of the material. Patient agrees to go over the instructions while at home for a better understanding.

## 2024-03-20 NOTE — Telephone Encounter (Signed)
 Pharmacy Patient Advocate Encounter  Received notification from [employer] that Prior Authorization for Ubrelvy  100MG  tablets has been APPROVED from 03/17/2024 to 03/17/2025   PA #/Case ID/Reference #:  [id number]

## 2024-03-21 ENCOUNTER — Ambulatory Visit (HOSPITAL_COMMUNITY)
Admission: RE | Admit: 2024-03-21 | Discharge: 2024-03-21 | Disposition: A | Discharge status: Home / Self Care | Attending: Obstetrics and Gynecology | Admitting: Obstetrics and Gynecology

## 2024-03-21 ENCOUNTER — Ambulatory Visit (HOSPITAL_COMMUNITY)

## 2024-03-21 ENCOUNTER — Encounter (HOSPITAL_COMMUNITY)
Admission: RE | Disposition: A | Discharge status: Home / Self Care | Payer: Self-pay | Source: Home / Self Care | Attending: Obstetrics and Gynecology

## 2024-03-21 DIAGNOSIS — F32A Depression, unspecified: Secondary | ICD-10-CM | POA: Insufficient documentation

## 2024-03-21 DIAGNOSIS — G43909 Migraine, unspecified, not intractable, without status migrainosus: Secondary | ICD-10-CM | POA: Diagnosis not present

## 2024-03-21 DIAGNOSIS — J4489 Other specified chronic obstructive pulmonary disease: Secondary | ICD-10-CM | POA: Insufficient documentation

## 2024-03-21 DIAGNOSIS — N189 Chronic kidney disease, unspecified: Secondary | ICD-10-CM | POA: Diagnosis not present

## 2024-03-21 DIAGNOSIS — F419 Anxiety disorder, unspecified: Secondary | ICD-10-CM | POA: Insufficient documentation

## 2024-03-21 DIAGNOSIS — D069 Carcinoma in situ of cervix, unspecified: Secondary | ICD-10-CM

## 2024-03-21 DIAGNOSIS — D060 Carcinoma in situ of endocervix: Secondary | ICD-10-CM | POA: Diagnosis not present

## 2024-03-21 HISTORY — DX: Nausea with vomiting, unspecified: R11.2

## 2024-03-21 HISTORY — PX: LEEP: SHX91

## 2024-03-21 LAB — POCT PREGNANCY, URINE: Preg Test, Ur: NEGATIVE

## 2024-03-21 SURGERY — LEEP (LOOP ELECTROSURGICAL EXCISION PROCEDURE)
Anesthesia: General | Site: Cervix

## 2024-03-21 MED ORDER — OXYCODONE HCL 5 MG/5ML PO SOLN: 5.0000 mg | Freq: Once | ORAL | Status: DC | PRN

## 2024-03-21 MED ORDER — MIDAZOLAM HCL 2 MG/2ML IJ SOLN: 0.5000 mg | Freq: Once | INTRAMUSCULAR | Status: DC | PRN

## 2024-03-21 MED ORDER — MONSELS FERRIC SUBSULFATE EX SOLN
CUTANEOUS | Status: DC | PRN
  Administered 2024-03-21: 1 via TOPICAL

## 2024-03-21 MED ORDER — ACETIC ACID 5 % SOLN
Status: AC
  Filled 2024-03-21: qty 12

## 2024-03-21 MED ORDER — PROPOFOL 10 MG/ML IV BOLUS
INTRAVENOUS | Status: AC
  Filled 2024-03-21: qty 20

## 2024-03-21 MED ORDER — LIDOCAINE 2% (20 MG/ML) 5 ML SYRINGE
INTRAMUSCULAR | Status: DC | PRN
  Administered 2024-03-21: 50 mg via INTRAVENOUS

## 2024-03-21 MED ORDER — ACETAMINOPHEN 10 MG/ML IV SOLN
INTRAVENOUS | Status: DC | PRN
  Administered 2024-03-21: 1000 mg via INTRAVENOUS

## 2024-03-21 MED ORDER — PHENYLEPHRINE 80 MCG/ML (10ML) SYRINGE FOR IV PUSH (FOR BLOOD PRESSURE SUPPORT)
PREFILLED_SYRINGE | INTRAVENOUS | Status: AC
  Filled 2024-03-21: qty 10

## 2024-03-21 MED ORDER — MONSELS FERRIC SUBSULFATE EX SOLN
CUTANEOUS | Status: AC
  Filled 2024-03-21: qty 8

## 2024-03-21 MED ORDER — DEXAMETHASONE SOD PHOSPHATE PF 10 MG/ML IJ SOLN
INTRAMUSCULAR | Status: DC | PRN
Start: 2024-03-21 — End: 2024-03-21
  Administered 2024-03-21: 5 mg via INTRAVENOUS

## 2024-03-21 MED ORDER — MEPERIDINE HCL 25 MG/ML IJ SOLN: 6.2500 mg | INTRAMUSCULAR | Status: DC | PRN

## 2024-03-21 MED ORDER — ACETIC ACID 4% SOLUTION
Status: DC | PRN
  Administered 2024-03-21: 2 via TOPICAL

## 2024-03-21 MED ORDER — LIDOCAINE-EPINEPHRINE 1 %-1:100000 IJ SOLN
INTRAMUSCULAR | Status: AC
  Filled 2024-03-21: qty 1

## 2024-03-21 MED ORDER — CHLORHEXIDINE GLUCONATE 0.12 % MT SOLN
15.0000 mL | Freq: Once | OROMUCOSAL | Status: AC
  Administered 2024-03-21: 15 mL via OROMUCOSAL

## 2024-03-21 MED ORDER — FENTANYL CITRATE (PF) 250 MCG/5ML IJ SOLN
INTRAMUSCULAR | Status: DC | PRN
  Administered 2024-03-21 (×2): 25 ug via INTRAVENOUS
  Administered 2024-03-21: 50 ug via INTRAVENOUS

## 2024-03-21 MED ORDER — FENTANYL CITRATE (PF) 250 MCG/5ML IJ SOLN
INTRAMUSCULAR | Status: AC
  Filled 2024-03-21: qty 5

## 2024-03-21 MED ORDER — ONDANSETRON HCL 4 MG/2ML IJ SOLN
INTRAMUSCULAR | Status: DC | PRN
  Administered 2024-03-21: 4 mg via INTRAVENOUS

## 2024-03-21 MED ORDER — ACETAMINOPHEN 10 MG/ML IV SOLN
INTRAVENOUS | Status: AC
  Filled 2024-03-21: qty 100

## 2024-03-21 MED ORDER — OXYCODONE HCL 5 MG PO TABS: 5.0000 mg | ORAL_TABLET | Freq: Once | ORAL | Status: DC | PRN

## 2024-03-21 MED ORDER — SCOPOLAMINE 1 MG/3DAYS TD PT72
1.0000 | MEDICATED_PATCH | TRANSDERMAL | Status: DC
  Administered 2024-03-21: 1 via TRANSDERMAL

## 2024-03-21 MED ORDER — SCOPOLAMINE 1 MG/3DAYS TD PT72
MEDICATED_PATCH | TRANSDERMAL | Status: AC
  Filled 2024-03-21: qty 1

## 2024-03-21 MED ORDER — LIDOCAINE 2% (20 MG/ML) 5 ML SYRINGE
INTRAMUSCULAR | Status: AC
  Filled 2024-03-21: qty 5

## 2024-03-21 MED ORDER — LACTATED RINGERS IV SOLN: INTRAVENOUS | Status: DC

## 2024-03-21 MED ORDER — PROPOFOL 10 MG/ML IV BOLUS
INTRAVENOUS | Status: DC | PRN
  Administered 2024-03-21: 200 mg via INTRAVENOUS

## 2024-03-21 MED ORDER — FENTANYL CITRATE (PF) 100 MCG/2ML IJ SOLN: 25.0000 ug | INTRAMUSCULAR | Status: DC | PRN

## 2024-03-21 MED ORDER — MIDAZOLAM HCL 2 MG/2ML IJ SOLN
INTRAMUSCULAR | Status: DC | PRN
  Administered 2024-03-21: 2 mg via INTRAVENOUS

## 2024-03-21 MED ORDER — ORAL CARE MOUTH RINSE: 15.0000 mL | Freq: Once | OROMUCOSAL | Status: AC

## 2024-03-21 MED ORDER — LIDOCAINE-EPINEPHRINE 1 %-1:100000 IJ SOLN
INTRAMUSCULAR | Status: DC | PRN
  Administered 2024-03-21: 10 mL

## 2024-03-21 MED ORDER — ONDANSETRON HCL 4 MG/2ML IJ SOLN
INTRAMUSCULAR | Status: AC
  Filled 2024-03-21: qty 2

## 2024-03-21 MED ORDER — MIDAZOLAM HCL 2 MG/2ML IJ SOLN
INTRAMUSCULAR | Status: AC
  Filled 2024-03-21: qty 2

## 2024-03-21 SURGICAL SUPPLY — 19 items
BLADE SURG 11 STRL SS (BLADE) ×1 IMPLANT
COVER MAYO STAND STRL (DRAPES) ×1 IMPLANT
ELECT BALL LEEP 3MM BLK (ELECTRODE) IMPLANT
ELECT BALL LEEP 5MM RED (ELECTRODE) IMPLANT
ELECTRODE LOOP LP RND 10X10YLW (CUTTING LOOP) IMPLANT
ELECTRODE LOOP LP RND 15X12GRN (CUTTING LOOP) IMPLANT
ELECTRODE LOOP LP RND 20X12WHT (CUTTING LOOP) IMPLANT
ELECTRODE LOOP LP SQR 10X10ORG (CUTTING LOOP) IMPLANT
EXTENDER ELECT LOOP LEEP 10CM (CUTTING LOOP) IMPLANT
GLOVE BIO SURGEON STRL SZ7 (GLOVE) ×1 IMPLANT
GLOVE BIOGEL PI IND STRL 7.0 (GLOVE) ×1 IMPLANT
GOWN STRL REUS W/ TWL LRG LVL3 (GOWN DISPOSABLE) ×1 IMPLANT
PACK VAGINAL WOMENS (CUSTOM PROCEDURE TRAY) ×1 IMPLANT
PAD OB MATERNITY 11 LF (PERSONAL CARE ITEMS) ×1 IMPLANT
SCOPETTES 8 STERILE (MISCELLANEOUS) ×1 IMPLANT
SLEEVE SCD COMPRESS KNEE MED (STOCKING) ×1 IMPLANT
SUT SILK 2 0 SH (SUTURE) ×1 IMPLANT
SUT VIC AB 0 CT1 27XBRD ANBCTR (SUTURE) IMPLANT
TOWEL GREEN STERILE (TOWEL DISPOSABLE) ×1 IMPLANT

## 2024-03-21 NOTE — Anesthesia Procedure Notes (Signed)
 Procedure Name: LMA Insertion Date/Time: 03/21/2024 3:01 PM  Performed by: Zakaiya Lares J, CRNAPre-anesthesia Checklist: Patient identified, Emergency Drugs available, Suction available and Patient being monitored Patient Re-evaluated:Patient Re-evaluated prior to induction Oxygen Delivery Method: Circle System Utilized Preoxygenation: Pre-oxygenation with 100% oxygen Induction Type: IV induction Ventilation: Mask ventilation without difficulty LMA: LMA inserted LMA Size: 4.0 Number of attempts: 1 Airway Equipment and Method: Bite block Placement Confirmation: positive ETCO2 Tube secured with: Tape Dental Injury: Teeth and Oropharynx as per pre-operative assessment

## 2024-03-21 NOTE — Anesthesia Preprocedure Evaluation (Signed)
 Anesthesia Evaluation  Patient identified by MRN, date of birth, ID band Patient awake    Reviewed: Allergy & Precautions, NPO status , Patient's Chart, lab work & pertinent test results  History of Anesthesia Complications (+) PONV  Airway Mallampati: II  TM Distance: >3 FB Neck ROM: Full    Dental  (+) Dental Advisory Given   Pulmonary asthma , COPD,  COPD inhaler   breath sounds clear to auscultation       Cardiovascular negative cardio ROS  Rhythm:Regular Rate:Normal     Neuro/Psych  Headaches  Anxiety Depression       GI/Hepatic negative GI ROS, Neg liver ROS,,,  Endo/Other  negative endocrine ROS    Renal/GU Renal InsufficiencyRenal disease     Musculoskeletal   Abdominal   Peds  Hematology negative hematology ROS (+)   Anesthesia Other Findings   Reproductive/Obstetrics                              Anesthesia Physical Anesthesia Plan  ASA: 2  Anesthesia Plan: General   Post-op Pain Management: Ofirmev  IV (intra-op)* and Toradol  IV (intra-op)*   Induction: Intravenous  PONV Risk Score and Plan: 4 or greater and Ondansetron , Dexamethasone  and Scopolamine  patch - Pre-op  Airway Management Planned: LMA  Additional Equipment: None  Intra-op Plan:   Post-operative Plan:   Informed Consent: I have reviewed the patients History and Physical, chart, labs and discussed the procedure including the risks, benefits and alternatives for the proposed anesthesia with the patient or authorized representative who has indicated his/her understanding and acceptance.     Dental advisory given  Plan Discussed with: CRNA and Surgeon  Anesthesia Plan Comments:         Anesthesia Quick Evaluation

## 2024-03-21 NOTE — Op Note (Signed)
 Preop: CIN3, prior LEEP with positive endocervical margin Postop: Same Op: LEEP Surgeon: Dr. Kieth Carolin Assist: None Anesthesia: LMA  EBL 5 cc IVF: 200 cc  UOP: Not collected Findings: Normal cervix consistency, no abnormal firmness. Has defect from 1-2 o'clock, suspect this may be related to obstetric trauma.  Specimen: LEEP (ectocervix) with stitch at 12 o'clock, top hat (endocervix), and post-LEEP ECC  Description of the procedure: Preop antibiotics not indicated. Informed consent reviewed and signed. Pt given opportunity to ask questions.   Pt prepped and draped in the normal dorsal lithotomy fashion after anesthesia found to be adequate. Timeout performed.  Vented speculum placed into the vagina. Intracervical block done with 1% xylocaine  and 1:200,000 units of epinephrine  - 10 cc total used. First pass done with 10x27mm loop. Top hat then done with a 10x15mm loop. Cervical edge cauterized with the ball. Monsel's applied. Procedure completed. All instruments removed.   Pt taken to recovery room in stable condition.  Kieth Carolin, MD Obstetrician & Gynecologist, [facility] for [employer], [facility]

## 2024-03-21 NOTE — Discharge Instructions (Addendum)
 Post-surgical Instructions, Outpatient Surgery  We placed a gel on your cervix that helps minimize bleeding. It will cause black, brown or grey discharge that looks like coffee grounds for the next few days. This is normal.  You may expect to feel dizzy, weak, and drowsy for as long as 24 hours after receiving the medicine that made you sleep (anesthetic). For the first 24 hours after your surgery:   Do not drive a car, ride a bicycle, participate in physical activities, or take public transportation Do not drink alcohol or take tranquilizers.  Do not take medicine that has not been prescribed by your physicians.  Do not sign important papers or make important decisions while on narcotic pain medicines.  Have a responsible person with you.   PAIN MANAGEMENT Ibuprofen  600mg .  (This is the same as 3-200mg  over the counter tablets of Motrin  or ibuprofen .)  Take this every 6 hours or as needed for cramping.   Acetaminophen  1000mg  (This is the same as 2-500mg  over the counter extra strength tylenol ). Take this every 6 hours as needed afterwards for pain  DO'S AND DON'T'S Do not take a tub bath for 4 weeks.  You may shower on the first day after your surgery Do move around as you feel able.  Stairs are fine.  You may begin to exercise again as you feel able.  Do not put anything in the vagina for four weeks--no tampons, intercourse, or douching.    REGULAR MEDIATIONS/VITAMINS: You may restart all of your regular medications as prescribed. You may restart all of your vitamins as you normally take them.    PLEASE CALL OR SEEK MEDICAL CARE IF: You have persistent nausea and vomiting.  You have trouble eating or drinking.  You have an oral temperature above 100.5.  You have constipation that is not helped by adjusting diet or increasing fluid intake.  You have heavy vaginal bleeding - soaking pads in less than 1 hour

## 2024-03-21 NOTE — H&P (Signed)
 PRE OPERATIVE HISTORY AND PHYSICAL GYNECOLOGY VISIT  Subjective:  Vicki Harper is a 28 y.o. 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 Notes recent MRI with liver lesions and PCP thought it could be related to cervix.   Past Medical History:  Diagnosis Date   Anemia    Anxiety    Asthma    Back pain    Chronic kidney disease    Depression    Headache    Hypothyroidism    Migraines    PONV (postoperative nausea and vomiting)    Vaginal Pap smear, abnormal    Past Surgical History:  Procedure Laterality Date   BREAST REDUCTION SURGERY Bilateral 02/04/2024   Procedure: MAMMOPLASTY, REDUCTION;  Surgeon: Waddell Leonce NOVAK, MD;  Location: [facility];  Service: Plastics;  Laterality: Bilateral;   MOUTH SURGERY     No current facility-administered medications on file prior to encounter.   Current Outpatient Medications on File Prior to Encounter  Medication Sig Dispense Refill   albuterol  (VENTOLIN  HFA) 108 (90 Base) MCG/ACT inhaler Inhale 1 puff into the lungs every 4 (four) hours as needed for wheezing or shortness of breath.     Atogepant  (QULIPTA ) 60 MG TABS Take 1 tablet (60 mg total) by mouth daily. 90 tablet 3   EPINEPHrine  0.3 mg/0.3 mL IJ SOAJ injection Inject 0.3 mg into the muscle as needed for anaphylaxis.     FLUoxetine (PROZAC) 40 MG capsule Take 40 mg by mouth daily.     fluticasone -salmeterol (ADVAIR) 100-50 MCG/ACT AEPB Inhale 1 puff into the lungs 2 (two) times daily.     medroxyPROGESTERone  Acetate 150 MG/ML SUSY Inject 150 mg into the muscle every 3 (three) months.     traZODone (DESYREL) 100 MG tablet Take 100 mg by mouth at  bedtime.     guanFACINE (INTUNIV) 2 MG TB24 ER tablet Take 2 mg by mouth at bedtime.     ondansetron  (ZOFRAN ) 4 MG tablet Take 1 tablet (4 mg total) by mouth every 8 (eight) hours as needed for up to 20 doses for nausea or vomiting. (Patient not taking: Reported on 03/17/2024) 20 tablet 0   oxyCODONE  (ROXICODONE ) 5 MG immediate release tablet Take 1 tablet (5 mg total) by mouth every 6 (six) hours as needed for up to 20 doses for severe pain (pain score 7-10). (Patient not taking: Reported on 03/17/2024) 20 tablet 0   Allergies  Allergen Reactions   Penicillins Hives and Itching   OB History     Gravida  1   Para  1   Term  1   Preterm      AB      Living  1      SAB      IAB      Ectopic      Multiple  0   Live Births  1          Social History   Socioeconomic History   Marital status: Single    Spouse name: Not on file   Number of children: 1   Years of education: Not on file   Highest education level: Not  on file  Occupational History   Not on file  Tobacco Use   Smoking status: Never   Smokeless tobacco: Never  Vaping Use   Vaping status: Never Used  Substance and Sexual Activity   Alcohol use: No    Alcohol/week: 0.0 standard drinks of alcohol   Drug use: No   Sexual activity: Not Currently    Birth control/protection: Injection    Comment: Depo  Other Topics Concern   Not on file  Social History Narrative   Lives  baby girl.    Right handed   Caffeine : 1 cup/day   Social Drivers of Corporate investment banker Strain: Low Risk  (08/09/2023)   Received from [facility]   Overall Financial Resource Strain (CARDIA)    Difficulty of Paying Living Expenses: Not very hard  Food Insecurity: Patient Declined (08/09/2023)   Received from [facility]   Hunger Vital Sign    Within the past 12 months, you worried that your food would run out before you got the money to buy more.: Patient declined    Within the past 12 months, the food you bought  just didn't last and you didn't have money to get more.: Patient declined  Transportation Needs: No Transportation Needs (08/09/2023)   Received from [facility] - Transportation    Lack of Transportation (Medical): No    Lack of Transportation (Non-Medical): No  Physical Activity: Unknown (08/09/2023)   Received from [facility]   Exercise Vital Sign    On average, how many days per week do you engage in moderate to strenuous exercise (like a brisk walk)?: 1 day    On average, how many minutes do you engage in exercise at this level?: Patient declined  Stress: Stress Concern Present (08/09/2023)   Received from [facility] - Occupational Stress Questionnaire    Feeling of Stress : To some extent  Social Connections: Socially Isolated (08/09/2023)   Received from [facility]   Social Network    How would you rate your social network (family, work, friends)?: Little participation, lonely and socially isolated  Intimate Partner Violence: Not At Risk (08/09/2023)   Received from [facility]   HITS    Over the last 12 months how often did your partner physically hurt you?: Never    Over the last 12 months how often did your partner insult you or talk down to you?: Never    Over the last 12 months how often did your partner threaten you with physical harm?: Never    Over the last 12 months how often did your partner scream or curse at you?: Never   Objective:   Vitals:   03/21/24 1229  BP: 117/73  Pulse: 69  Resp: 17  Temp: 98.1 F (36.7 C)  TempSrc: Oral  SpO2: 100%  Weight: 72.6 kg  Height: 5' 6 (1.676 m)   General:  Alert, oriented and cooperative. Patient is in no acute distress.  Skin: Skin is warm and dry. No rash noted.   Cardiovascular: Normal heart rate noted  Respiratory: Normal respiratory effort, no problems with respiration noted   Assessment and Plan:  [name] is a 28 y.o. with CIN3 s/p LEEP  with +endocervical margin presenting for repeat excisional procedure (LEEP vs CKC)  - Diagnosis: cervical dysplasia, CIN3 - Planned surgery: LEEP vs CKC (pending intraoperative findings)  - Risks of surgery include but are not limited to:  bleeding, infection, injury to surrounding organs/tissues (i.e. bowel/bladder/ureters), need for additional procedures, wound complications, hospital re-admission, and VTE - We discussed postop restrictions, precautions and expectations - All questions answered - No pre op abx indicated, to OR when ready  Kieth JAYSON Carolin, MD

## 2024-03-21 NOTE — Transfer of Care (Signed)
 Immediate Anesthesia Transfer of Care Note  Patient: Vicki Harper  Procedure(s) Performed: LEEP (LOOP ELECTROSURGICAL EXCISION PROCEDURE) (Cervix)  Patient Location: PACU  Anesthesia Type:General  Level of Consciousness: awake, alert , and oriented  Airway & Oxygen Therapy: Patient Spontanous Breathing and Patient connected to face mask oxygen  Post-op Assessment: Report given to RN and Post -op Vital signs reviewed and stable  Post vital signs: Reviewed and stable  Last Vitals:  Vitals Value Taken Time  BP 115/76 03/21/24 15:45  Temp 36.7 C 03/21/24 15:45  Pulse 61 03/21/24 15:46  Resp 16 03/21/24 15:46  SpO2 100 % 03/21/24 15:46  Vitals shown include unfiled device data.  Last Pain:  Vitals:   03/21/24 1348  TempSrc:   PainSc: 0-No pain         Complications: No notable events documented.

## 2024-03-21 NOTE — Anesthesia Postprocedure Evaluation (Signed)
 Anesthesia Post Note  Patient: Vicki Harper  Procedure(s) Performed: LEEP (LOOP ELECTROSURGICAL EXCISION PROCEDURE) (Cervix)     Patient location during evaluation: PACU Anesthesia Type: General Level of consciousness: awake and alert, oriented and patient cooperative Pain management: pain level controlled Vital Signs Assessment: post-procedure vital signs reviewed and stable Respiratory status: spontaneous breathing, nonlabored ventilation and respiratory function stable Cardiovascular status: blood pressure returned to baseline and stable Postop Assessment: no apparent nausea or vomiting, adequate PO intake and able to ambulate Anesthetic complications: no   No notable events documented.  Last Vitals:  Vitals:   03/21/24 1600 03/21/24 1615  BP: 111/75 113/74  Pulse: (!) 55 (!) 53  Resp: 18 10  Temp:    SpO2: 100% 100%    Last Pain:  Vitals:   03/21/24 1615  TempSrc:   PainSc: 0-No pain                 Arrin Pintor,E. Lenda Baratta

## 2024-03-22 ENCOUNTER — Encounter (HOSPITAL_COMMUNITY): Payer: Self-pay | Admitting: Obstetrics and Gynecology

## 2024-03-24 ENCOUNTER — Ambulatory Visit: Payer: Self-pay | Admitting: Obstetrics and Gynecology

## 2024-03-24 LAB — SURGICAL PATHOLOGY

## 2024-04-13 ENCOUNTER — Ambulatory Visit (INDEPENDENT_AMBULATORY_CARE_PROVIDER_SITE_OTHER): Admitting: Plastic Surgery

## 2024-04-13 DIAGNOSIS — Z9889 Other specified postprocedural states: Secondary | ICD-10-CM

## 2024-04-13 NOTE — Progress Notes (Signed)
 Ms. Bachtell returns today approximately 2 months postop from a bilateral breast reduction.  Doing very well with no complaints.  She is very happy with her results.  On examination she is healed very nicely with excellent shape and symmetry.  We discussed strategies for scar management including silicone-based scar creams and silicone tape.  She will get sized for a new bra at 3 months and we will continue to monitor her scars for the next year.  Follow-up with me as desired.

## 2024-04-21 ENCOUNTER — Other Ambulatory Visit (HOSPITAL_COMMUNITY): Payer: Self-pay

## 2024-04-25 ENCOUNTER — Ambulatory Visit: Admitting: Obstetrics and Gynecology

## 2024-04-25 ENCOUNTER — Encounter: Payer: Self-pay | Admitting: Obstetrics and Gynecology

## 2024-04-25 VITALS — BP 110/72 | HR 68 | Ht 66.0 in | Wt 169.0 lb

## 2024-04-25 DIAGNOSIS — Z09 Encounter for follow-up examination after completed treatment for conditions other than malignant neoplasm: Secondary | ICD-10-CM

## 2024-04-25 NOTE — Progress Notes (Signed)
   POST OPERATIVE GYNECOLOGY VISIT  Subjective:  Vicki Harper is a 28 y.o. G1P1001 on Depo 5wk s/p repeat LEEP for CIN3 with positive margins  Path with CIN1, ectocervical margin w/ CIN1 but no high grade dysplasia. No issues with recovery but is having vaginal spotting. No pain.   Would like to stop Depo after she gets her December dose and does not want to start another form of contraception afterwards. Interested in TTC.   Objective:   Vitals:   04/25/24 1026  BP: 110/72  Pulse: 68  Weight: 169 lb (76.7 kg)  Height: 5' 6 (1.676 m)   General:  Alert, oriented and cooperative. Patient is in no acute distress.  Skin: Skin is warm and dry. No rash noted.   Cardiovascular: Normal heart rate noted  Respiratory: Normal respiratory effort, no problems with respiration noted  Abdomen: Soft, non-tender, non-distended   Pelvic: NEFG. Cervix well healed, bleeding appears to be uterine rather than cervical. Has defect from 1-2 o'clock as previously visualized that I suspect is due to prior obstetric trauma as this has been present since her original colposcopy  Exam performed in the presence of a chaperone  Assessment and Plan:  Vicki Harper is a 28 y.o. 5wk sp LEEP  Recovering appropriately HPV vaccine #3 due Jan 2026 Cotest due April 2026 Avoid pregnancy x 1 year after LEEP  Future Appointments  Date Time Provider Department Center  04/02/2025 10:00 AM [place] No, NP [facility] None   Kieth JAYSON Carolin, MD

## 2024-04-25 NOTE — Progress Notes (Signed)
 28 y.o. GYN presents for Post Op Follow-up.  Reports no concerns today.  3rd Final Gardasil Injection is due in January 2026.

## 2024-05-17 ENCOUNTER — Other Ambulatory Visit (HOSPITAL_COMMUNITY): Payer: Self-pay

## 2024-05-17 ENCOUNTER — Encounter: Payer: Self-pay | Admitting: Pharmacy Technician

## 2024-05-17 NOTE — Telephone Encounter (Signed)
 ERROR

## 2024-05-25 ENCOUNTER — Ambulatory Visit (INDEPENDENT_AMBULATORY_CARE_PROVIDER_SITE_OTHER)

## 2024-05-25 ENCOUNTER — Other Ambulatory Visit: Payer: Self-pay | Admitting: Gastroenterology

## 2024-05-25 VITALS — BP 116/73 | HR 73 | Wt 172.6 lb

## 2024-05-25 DIAGNOSIS — Z23 Encounter for immunization: Secondary | ICD-10-CM | POA: Diagnosis not present

## 2024-05-25 DIAGNOSIS — K769 Liver disease, unspecified: Secondary | ICD-10-CM

## 2024-05-25 NOTE — Progress Notes (Signed)
 Pt is in the office for 3rd Garadsil injection Administered in [place] and pt tolerated well Pt stated that she will return in April for repeat pap smear

## 2024-06-06 ENCOUNTER — Other Ambulatory Visit (HOSPITAL_COMMUNITY): Payer: Self-pay

## 2024-06-06 ENCOUNTER — Telehealth: Payer: Self-pay

## 2024-06-06 NOTE — Telephone Encounter (Signed)
 Pharmacy Patient Advocate Encounter   Received notification from Fax that prior authorization for Qulipta  is required/requested.   Insurance verification completed.   The patient is insured through [facility].   Per test claim: PA required; PA started via CoverMyMeds. KEY [id number] . Waiting for clinical questions to populate.

## 2024-06-06 NOTE — Telephone Encounter (Signed)
 Clinical questions have been answered and PA submitted. PA currently Pending. Please be advised that most companies allow up to 30 days to make a decision. We will advise when a determination has been made, or follow up in 1 week.   Please reach out to our team, Rx Prior Auth Pool, if you haven't heard back in a week.

## 2024-06-07 ENCOUNTER — Other Ambulatory Visit (HOSPITAL_COMMUNITY): Payer: Self-pay

## 2024-06-07 NOTE — Telephone Encounter (Signed)
 Pharmacy Patient Advocate Encounter   Received notification from CoverMyMeds that prior authorization for Qulipta  is required/requested.   Insurance verification completed.   The patient is insured through [facility].   Per test claim: PA required; PA submitted to above mentioned insurance via Latent Key/confirmation #/EOC [id number] Status is pending  Resubmitted-pt has two [employer] plans, one is commercial and the other is medicaid-the first submission they stated PA not needed and that this was sent to [facility] and should be submitted to her primary opt[employer]rx plan, however I did submit it but the RX Groups were different this time-we shall see.

## 2024-06-07 NOTE — Telephone Encounter (Signed)
 Pharmacy Patient Advocate Encounter  Received notification from [employer] that Prior Authorization for Qulipta  has been APPROVED from 06/07/2024 to 13/31/2026. Ran test claim, Copay is $0. This test claim was processed through [facility]- copay amounts may vary at other pharmacies due to pharmacy/plan contracts, or as the patient moves through the different stages of their insurance plan.   PA #/Case ID/Reference #: [id number]

## 2024-06-15 ENCOUNTER — Other Ambulatory Visit

## 2024-06-21 ENCOUNTER — Emergency Department (HOSPITAL_BASED_OUTPATIENT_CLINIC_OR_DEPARTMENT_OTHER)
Admission: EM | Admit: 2024-06-21 | Discharge: 2024-06-21 | Disposition: A | Discharge status: Home / Self Care | Attending: Emergency Medicine | Admitting: Emergency Medicine

## 2024-06-21 ENCOUNTER — Encounter (HOSPITAL_BASED_OUTPATIENT_CLINIC_OR_DEPARTMENT_OTHER): Payer: Self-pay

## 2024-06-21 ENCOUNTER — Other Ambulatory Visit: Payer: Self-pay

## 2024-06-21 ENCOUNTER — Emergency Department (HOSPITAL_BASED_OUTPATIENT_CLINIC_OR_DEPARTMENT_OTHER)

## 2024-06-21 DIAGNOSIS — R10A1 Flank pain, right side: Secondary | ICD-10-CM | POA: Insufficient documentation

## 2024-06-21 DIAGNOSIS — M791 Myalgia, unspecified site: Secondary | ICD-10-CM

## 2024-06-21 LAB — COMPREHENSIVE METABOLIC PANEL WITH GFR
ALT: 6 U/L (ref 0–44)
AST: 15 U/L (ref 15–41)
Albumin: 4.7 g/dL (ref 3.5–5.0)
Alkaline Phosphatase: 51 U/L (ref 38–126)
Anion gap: 11 (ref 5–15)
BUN: 14 mg/dL (ref 6–20)
CO2: 20 mmol/L — ABNORMAL LOW (ref 22–32)
Calcium: 9.2 mg/dL (ref 8.9–10.3)
Chloride: 108 mmol/L (ref 98–111)
Creatinine, Ser: 0.94 mg/dL (ref 0.44–1.00)
GFR, Estimated: >60 mL/min
Glucose, Bld: 93 mg/dL (ref 70–99)
Potassium: 4.2 mmol/L (ref 3.5–5.1)
Sodium: 139 mmol/L (ref 135–145)
Total Bilirubin: 0.6 mg/dL (ref 0.0–1.2)
Total Protein: 7.3 g/dL (ref 6.5–8.1)

## 2024-06-21 LAB — URINALYSIS, ROUTINE W REFLEX MICROSCOPIC
Bilirubin Urine: NEGATIVE
Glucose, UA: NEGATIVE mg/dL
Hgb urine dipstick: NEGATIVE
Ketones, ur: NEGATIVE mg/dL
Leukocytes,Ua: NEGATIVE
Nitrite: NEGATIVE
Protein, ur: NEGATIVE mg/dL
Specific Gravity, Urine: 1.02 (ref 1.005–1.030)
pH: 7 (ref 5.0–8.0)

## 2024-06-21 LAB — CBC
HCT: 39.8 % (ref 36.0–46.0)
Hemoglobin: 13.2 g/dL (ref 12.0–15.0)
MCH: 30.3 pg (ref 26.0–34.0)
MCHC: 33.2 g/dL (ref 30.0–36.0)
MCV: 91.3 fL (ref 80.0–100.0)
Platelets: 274 K/uL (ref 150–400)
RBC: 4.36 MIL/uL (ref 3.87–5.11)
RDW: 12.2 % (ref 11.5–15.5)
WBC: 5.4 K/uL (ref 4.0–10.5)
nRBC: 0 % (ref 0.0–0.2)

## 2024-06-21 LAB — PREGNANCY, URINE: Preg Test, Ur: NEGATIVE

## 2024-06-21 LAB — LIPASE, BLOOD: Lipase: 52 U/L — ABNORMAL HIGH (ref 11–51)

## 2024-06-21 MED ORDER — CYCLOBENZAPRINE HCL 10 MG PO TABS: 10.0000 mg | ORAL_TABLET | Freq: Two times a day (BID) | ORAL | 0 refills | Status: AC | PRN

## 2024-06-21 MED ORDER — KETOROLAC TROMETHAMINE 15 MG/ML IJ SOLN
15.0000 mg | Freq: Once | INTRAMUSCULAR | Status: AC
  Administered 2024-06-21: 15 mg via INTRAMUSCULAR
  Filled 2024-06-21: qty 1

## 2024-06-21 MED ORDER — LIDOCAINE-EPINEPHRINE (PF) 2 %-1:200000 IJ SOLN
10.0000 mL | Freq: Once | INTRAMUSCULAR | Status: AC
  Administered 2024-06-21: 10 mL via INTRADERMAL
  Filled 2024-06-21: qty 20

## 2024-06-21 NOTE — ED Triage Notes (Signed)
 Patient here POV from Home.  Endorses Right Flank pain that began yesterday PM. Constant. No Dysuria. No Hematuria. No Fevers. No N/V/D.   NAD noted during triage. A&Ox4. Gcs 15. Ambulatory.

## 2024-06-21 NOTE — Discharge Instructions (Signed)
 You were seen for your flank pain in the emergency department.   At home, please take tylenol  and ibuprofen . Use over the counter lidocaine  patches. You may also take the cyclobenzaprine  we have prescribed you for any breakthrough pain that may have.  Do not take this before driving or operating heavy machinery.  Do not take this medication with alcohol.    Check your MyChart online for the results of any tests that had not resulted by the time you left the emergency department.   Follow-up with your primary doctor in 2-3 days regarding your visit.    Return immediately to the emergency department if you experience any of the following: worsening pain, fevers, leg weakness or numbness, or any other concerning symptoms.    Thank you for visiting our Emergency Department. It was a pleasure taking care of you today.

## 2024-06-21 NOTE — ED Notes (Signed)
 RN provided AVS using Teachback Method. Patient verbalizes understanding of Discharge Instructions. Opportunity for Questioning and Answers were provided by RN. Patient Discharged from ED ambulatory to home via self.

## 2024-06-21 NOTE — ED Provider Notes (Signed)
 " [place] [facility] Provider Note   CSN: [id number] Arrival date & time: 06/21/24  1604     Patient presents with: Flank Pain   Vicki Harper is a 29 y.o. female.   29 year old female no relevant PMH who presents to the emergency department with right flank pain.  Started yesterday after getting out of the shower.  Constant.  Worsened with movement.  There is a specific point but is tender just above her iliac crest on the right side posteriorly.  Does not recall any injuries.  No dysuria or frequency.  No fevers or chills.  No nausea, vomiting, or diarrhea.  No history of kidney stones.       Prior to Admission medications  Medication Sig Start Date End Date Taking? Authorizing Provider  albuterol  (VENTOLIN  HFA) 108 (90 Base) MCG/ACT inhaler Inhale 1 puff into the lungs every 4 (four) hours as needed for wheezing or shortness of breath.    [provider]  Atogepant  (QULIPTA ) 60 MG TABS Take 1 tablet (60 mg total) by mouth daily. 11/04/23   Lomax, Amy, NP  cyclobenzaprine  (FLEXERIL ) 10 MG tablet Take 1 tablet (10 mg total) by mouth 2 (two) times daily as needed for muscle spasms. 06/21/24   Yolande Lamar BROCKS, MD  FLUoxetine (PROZAC) 40 MG capsule Take 40 mg by mouth daily.    [provider]  fluticasone -salmeterol (ADVAIR) 100-50 MCG/ACT AEPB Inhale 1 puff into the lungs 2 (two) times daily. 06/23/23   [provider]  guanFACINE (INTUNIV) 2 MG TB24 ER tablet Take 2 mg by mouth at bedtime.    [provider]  [Paused] indomethacin  (INDOCIN ) 25 MG capsule Take 1 capsule (25 mg total) by mouth 3 (three) times daily with meals as needed for moderate pain (pain score 4-6). Wait to take this until your doctor or other care provider tells you to start again. 01/23/24   Elnor Jayson LABOR, DO  medroxyPROGESTERone  Acetate 150 MG/ML SUSY Inject 150 mg into the muscle every 3 (three) months. 08/09/23   [provider]  methylPREDNISolone  (MEDROL  DOSEPAK) 4 MG TBPK tablet Take as directed 03/15/24   Lomax, Amy, NP  metoCLOPramide  (REGLAN ) 5 MG tablet Take 1 tablet (5 mg total) by mouth every 8 (eight) hours as needed for nausea. 03/15/24   Lomax, Amy, NP  traZODone (DESYREL) 100 MG tablet Take 100 mg by mouth at bedtime. 10/04/23   [provider]  Ubrogepant  (UBRELVY ) 100 MG TABS Take 1 tablet (100 mg total) by mouth daily as needed. 03/15/24   Lomax, Amy, NP    Allergies: Penicillins    Review of Systems  Updated Vital Signs BP 113/80 (BP Location: Right Arm)   Pulse 80   Temp 98.2 F (36.8 C) (Oral)   Resp 18   Ht 5' 6 (1.676 m)   Wt 77.1 kg   SpO2 100%   BMI 27.44 kg/m   Physical Exam Constitutional:      Appearance: Normal appearance.  Abdominal:     General: There is no distension.     Palpations: There is no mass.     Tenderness: There is abdominal tenderness (See image for location). There is no right CVA tenderness, left CVA tenderness or guarding.  Musculoskeletal:       Arms:  Neurological:     Mental Status: She is alert.     (all labs ordered are listed, but only abnormal results are displayed) Labs Reviewed  LIPASE, BLOOD - Abnormal; Notable for the following components:      Result Value   Lipase 52 (*)    All other components within normal limits  COMPREHENSIVE METABOLIC PANEL WITH GFR - Abnormal; Notable for the following components:   CO2 20 (*)    All other components within normal limits  CBC  URINALYSIS, ROUTINE W REFLEX MICROSCOPIC  PREGNANCY, URINE    EKG: None  Radiology: CT Renal Stone Study Result Date: 06/21/2024 EXAM: CT ABDOMEN AND PELVIS WITHOUT CONTRAST 06/21/2024 07:47:36 PM TECHNIQUE: CT of the abdomen and pelvis was performed without the administration of intravenous contrast. Multiplanar reformatted images are provided for review. Automated exposure control, iterative reconstruction, and/or weight-based adjustment of the mA/kV was  utilized to reduce the radiation dose to as low as reasonably achievable. COMPARISON: 02/13/2024 CLINICAL HISTORY: R flank pain. FINDINGS: LOWER CHEST: No acute abnormality. LIVER: Subcentimeter hypodensity of the right hepatic lobe similar to prior. GALLBLADDER AND BILE DUCTS: Gallbladder is unremarkable. No biliary ductal dilatation. SPLEEN: No acute abnormality. PANCREAS: No acute abnormality. ADRENAL GLANDS: No acute abnormality. KIDNEYS, URETERS AND BLADDER: No stones in the kidneys or ureters. No hydronephrosis. No perinephric or periureteral stranding. Urinary bladder is unremarkable. GI AND BOWEL: Stomach demonstrates no acute abnormality. No small or large bowel thickening or dilatation. The appendix is unremarkable. There is no bowel obstruction. PERITONEUM AND RETROPERITONEUM: No ascites. No free air. VASCULATURE: Aorta is normal in caliber. LYMPH NODES: No lymphadenopathy. REPRODUCTIVE ORGANS: The uterus is unremarkable. No adnexal mass. BONES AND SOFT TISSUES: No acute osseous abnormality. No focal soft tissue abnormality. IMPRESSION: 1. No acute findings in the abdomen or pelvis with limited evaluation on this noncontrast study. Electronically signed by: Morgane Naveau MD 06/21/2024 08:13 PM EST RP Workstation: [id number]     Injection tendon or ligament  Date/Time: 06/22/2024 1:21 PM  Performed by: Yolande Lamar BROCKS, MD Authorized by: Yolande Lamar BROCKS, MD  Consent: Verbal consent obtained Risks and benefits: risks, benefits and alternatives were discussed Consent given by: patient Local anesthesia used: yes Anesthesia: local infiltration  Anesthesia: Local anesthesia used: yes Local Anesthetic: lidocaine  2% with epinephrine  Anesthetic total: 1 mL  Sedation: Patient sedated: no  Patient tolerance: patient tolerated the procedure well with no immediate complications Comments: Trigger point injection into right flank and images shown above on physical exam      Medications  Ordered in the ED  lidocaine -EPINEPHrine  (XYLOCAINE  W/EPI) 2 %-1:200000 (PF) injection 10 mL (10 mLs Intradermal Given 06/21/24 2024)  ketorolac  (TORADOL ) 15 MG/ML injection 15 mg (15 mg Intramuscular Given 06/21/24 2025)                                    Medical Decision Making Amount and/or Complexity of Data Reviewed Labs: ordered. Radiology: ordered.  Risk Prescription drug management.   Heide Brossart is a 29 y.o. female no relevant PMH who presents emergency department with right-sided flank pain  Initial Ddx:  Muscle strain, trigger point nephrolithiasis, pyelonephritis, lumbar radiculopathy, AAA  MDM:  Patient presents emergency department with right sided flank pain.  Worsened with movement.  Does have tenderness to palpation specific point.  No known injuries.  Given the location nephrolithiasis also on the differential.  Will obtain urinalysis to assess for pyelonephritis as well but feel this is less likely given the lack of systemic symptoms.  Given age AAA highly unlikely. No symptoms that would be  suggestive of radiculopathy.  Plan:  Labs Urinalysis CT stone protocol Pain medication  ED Summary/Re-evaluation:  CT without kidney stone.  Urinalysis without signs of urinary tract infection.  No AKI on lab work.  Discussed pain options and patient wanted to have a trigger point injection which was performed with some relief of her pain.  Will have them follow-up with urology for additional evaluation.  Discharged home with instructions to take Tylenol , ibuprofen , and use lidocaine  patches for her pain and follow-up with her primary doctor and sports medicine.  Also given cyclobenzaprine   This patient presents to the ED for concern of complaints listed in HPI, this involves an extensive number of treatment options, and is a complaint that carries with it a high risk of complications and morbidity. Disposition including potential need for admission considered.    Dispo: DC Home. Return precautions discussed including, but not limited to, those listed in the AVS. Allowed pt time to ask questions which were answered fully prior to dc.  Records reviewed Outpatient Clinic Notes The following labs were independently interpreted: Urinalysis and show no acute abnormality I independently reviewed the following imaging with scope of interpretation limited to determining acute life threatening conditions related to emergency care: CT Abdomen/Pelvis and agree with the radiologist interpretation with the following exceptions: none I personally reviewed and interpreted cardiac monitoring: normal sinus rhythm  I personally reviewed and interpreted the pt's EKG: see above for interpretation  I have reviewed the patients home medications and made adjustments as needed  Final diagnoses:  Trigger point  Right flank pain    ED Discharge Orders          Ordered    cyclobenzaprine  (FLEXERIL ) 10 MG tablet  2 times daily PRN        06/21/24 2128               Yolande Lamar BROCKS, MD 06/22/24 1322  "

## 2024-07-04 ENCOUNTER — Encounter: Payer: Self-pay | Admitting: Cardiology

## 2024-07-04 ENCOUNTER — Ambulatory Visit: Attending: Cardiology | Admitting: Cardiology

## 2024-07-04 ENCOUNTER — Other Ambulatory Visit

## 2024-07-04 VITALS — BP 112/72 | HR 77 | Resp 16 | Ht 66.0 in | Wt 170.0 lb

## 2024-07-04 DIAGNOSIS — R0789 Other chest pain: Secondary | ICD-10-CM | POA: Diagnosis not present

## 2024-07-04 DIAGNOSIS — R10A1 Flank pain, right side: Secondary | ICD-10-CM | POA: Diagnosis not present

## 2024-07-04 DIAGNOSIS — M940 Chondrocostal junction syndrome [Tietze]: Secondary | ICD-10-CM

## 2024-07-04 NOTE — Patient Instructions (Addendum)
 Medication Instructions:  Your physician recommends that you continue on your current medications as directed. Please refer to the Current Medication list given to you today.  *If you need a refill on your cardiac medications before your next appointment, please call your pharmacy*  Lab Work: Lab Orders  No laboratory test(s) ordered today    If you have labs (blood work) drawn today and your tests are completely normal, you will receive your results only by: MyChart Message (if you have MyChart) OR A paper copy in the mail If you have any lab test that is abnormal or we need to change your treatment, we will call you to review the results.  Testing/Procedures: You are scheduled for an Exercise Stress Test on ____________ at ______________  Please arrive 15 minutes prior to your appointment time for registration and insurance purposes.  The test will take approximately 45 minutes to complete.  How to prepare for your Exercise Stress Test: Do bring a list of your current medications with you.  If not listed below, you may take your medications as normal. Do wear comfortable clothes (no dresses or overalls) and walking shoes, tennis shoes preferred (no heels or open toed shoes are allowed) Do Not wear cologne, perfume, aftershave or lotions (deodorant is allowed). Please report to [address] for your test.  If these instructions are not followed, your test will have to be rescheduled.  If you have questions or concerns about your appointment, you can call the Stress Lab at [phone].  If you cannot keep your appointment, please provide 24 hours notification to the Stress Lab, to avoid a possible $50 charge to your account.   Follow-Up: At [facility], you and your health needs are our priority.  As part of our continuing mission to provide you with exceptional heart care, our providers are all part of one team.  This team includes your primary Cardiologist  (physician) and Advanced Practice Providers or APPs (Physician Assistants and Nurse Practitioners) who all work together to provide you with the care you need, when you need it.   Other Instructions You have been referred to  Referral to Physical Medicine Rehab   Your next appointment:    As Needed  Provider:   Gordy Bergamo, MD            We recommend signing up for the patient portal called MyChart.  Patients are able to view lab/test results, encounter notes, upcoming appointments, etc.  Non-urgent messages can be sent to your provider as well, go to [email or web address].

## 2024-07-04 NOTE — Progress Notes (Signed)
 " Cardiology Office Note:  .   Date:  07/04/2024  ID:  Vicki Harper, DOB 1996-05-29, MRN [id number] PCP: Rosalea Rosina SAILOR, PA  [facility] Providers Cardiologist:  Gordy Bergamo, MD   History of Present Illness: .   Vicki Harper is a 29 y.o. African-American female patient with anxiety and depression, reactive airway disease with bronchial asthma, chronic migraine headaches, referred from the emergency room after she presented with chest pain on 01/21/2024 and again on 01/23/2023. She was ruled out for myocardial infarction and ACS, coronary CT angiogram on 01/23/2024 essentially revealing no evidence of pulmonary embolism with trace pericardial effusion.  I had seen her on 01/28/2024 and diagnosed her with musculoskeletal chest pain, she has had recurrence of chest pain now presents for reevaluation.  In the interim she was evaluated in the emergency room on 06/21/2024 with right flank pain felt to be musculoskeletal.    Discussed the use of AI scribe software for clinical note transcription with the patient, who gave verbal consent to proceed.  History of Present Illness Vicki Harper is a 29 year old female who presents with persistent chest and flank pain.  She reports persistent, nonexertional mid-chest pain described as a tight sensation that occurs even at rest and has not improved since her last visit. She also has sharp flank pain that worsens with movement such as transitioning from sitting to standing and has persisted since a recent emergency room evaluation. She denies recent heavy lifting or significant physical exertion, and her job is primarily sedentary.   Cardiac Studies relevent.       EKG:   EKG Interpretation Date/Time:  Tuesday July 04 2024 09:19:26 EST Ventricular Rate:  75 PR Interval:  184 QRS Duration:  78 QT Interval:  382 QTC Calculation: 426 R Axis:   35  Text Interpretation: EKG 07/04/2024: Normal sinus rhythm at rate of 75  bpm, normal EKG.  Compared to 01/23/2024, no change. Confirmed by Bergamo Milan [phone]) on 07/04/2024 9:27:27 AM  Labs   Recent Labs    02/13/24 1036 03/06/24 2034 06/21/24 1645  NA 138 137 139  K 3.9 4.1 4.2  CL 106 105 108  CO2 22 23 20*  GLUCOSE 83 91 93  BUN 10 15 14   CREATININE 0.97 1.02* 0.94  CALCIUM 9.5 9.1 9.2  GFRNONAA >60 >60 >60    Lab Results  Component Value Date   ALT 6 06/21/2024   AST 15 06/21/2024   ALKPHOS 51 06/21/2024   BILITOT 0.6 06/21/2024      Latest Ref Rng & Units 06/21/2024    4:45 PM 03/06/2024    8:34 PM 02/13/2024   10:36 AM  CBC  WBC 4.0 - 10.5 K/uL 5.4  4.5  6.1   Hemoglobin 12.0 - 15.0 g/dL 86.7  88.5  88.7   Hematocrit 36.0 - 46.0 % 39.8  35.0  34.7   Platelets 150 - 400 K/uL 274  265  276    Lab Results  Component Value Date   HGBA1C 5.4 07/23/2021    Lab Results  Component Value Date   TSH 0.134 (L) 05/09/2020     ROS  Review of Systems  Cardiovascular:  Positive for chest pain. Negative for dyspnea on exertion, leg swelling, near-syncope and palpitations.   Physical Exam:   VS:  BP 112/72 (BP Location: Left Arm, Patient Position: Sitting, Cuff Size: Normal)   Pulse 77   Resp 16   Ht 5' 6 (1.676  m)   Wt 170 lb (77.1 kg)   SpO2 98%   BMI 27.44 kg/m    Wt Readings from Last 3 Encounters:  07/04/24 170 lb (77.1 kg)  06/21/24 170 lb (77.1 kg)  05/25/24 172 lb 9.6 oz (78.3 kg)    BP Readings from Last 3 Encounters:  07/04/24 112/72  06/21/24 113/80  05/25/24 116/73   Physical Exam Neck:     Vascular: No carotid bruit or JVD.  Cardiovascular:     Rate and Rhythm: Normal rate and regular rhythm.     Pulses: Intact distal pulses.     Heart sounds: Normal heart sounds. No murmur heard.    No gallop.  Pulmonary:     Effort: Pulmonary effort is normal.     Breath sounds: Normal breath sounds.  Chest:  Breasts:    Right: Tenderness (upper costochondral junction) present.  Abdominal:     General: Bowel sounds are  normal.     Palpations: Abdomen is soft.     Comments: Right flank superficial tenderness  Musculoskeletal:     Right lower leg: No edema.     Left lower leg: No edema.     ASSESSMENT AND PLAN: .      ICD-10-CM   1. Acute costochondritis  M94.0 EKG 12-Lead    Ambulatory referral to Physical Medicine Rehab    Cardiac Stress Test: Informed Consent Details: Physician/Practitioner Attestation; Transcribe to consent form and obtain patient signature    EXERCISE TOLERANCE TEST (ETT)    2. Atypical chest pain  R07.89     3. Right flank pain  R10.A1 Ambulatory referral to Physical Medicine Rehab     Assessment & Plan Acute costochondritis Persistent chest pain localized to the middle of the chest, described as tight and exacerbated by movement and deep breathing. EKG is normal, ruling out cardiac etiology. Pain is musculoskeletal in nature, likely due to inflammation of the costochondral junction. Common in young individuals and not serious. - Referred to physical medicine and rehabilitation for steroid injection into the costochondral region. - Provided reassurance and education about costochondritis and its benign nature. - Ordered treadmill exercise stress test for reassurance.  Right flank pain Persistent sharp pain in the right flank, exacerbated by movement and transitioning from sitting to standing. Pain is musculoskeletal in origin, likely due to muscle strain or inflammation. - Referred to physical medicine and rehabilitation for further evaluation and management. - Patient received lidocaine -epinephrine  local injection when she was evaluated in the emergency room.  Her records were reviewed from the ED.  Reassured the patient.  I will see her back on a as needed basis.   Follow up: PRN  Signed,  Gordy Bergamo, MD, [facility] 07/04/2024, 6:09 PM [facility] [address] [place], [place] [address] Phone: [phone]. Fax:  [phone]  "

## 2024-07-05 ENCOUNTER — Ambulatory Visit
Admission: RE | Admit: 2024-07-05 | Discharge: 2024-07-05 | Disposition: A | Source: Ambulatory Visit | Attending: Gastroenterology

## 2024-07-05 DIAGNOSIS — K769 Liver disease, unspecified: Secondary | ICD-10-CM

## 2024-07-05 MED ORDER — GADOXETATE DISODIUM 0.25 MMOL/ML IV SOLN
7.0000 mL | Freq: Once | INTRAVENOUS | Status: AC | PRN
  Administered 2024-07-05: 7 mL via INTRAVENOUS

## 2024-07-26 ENCOUNTER — Ambulatory Visit (HOSPITAL_COMMUNITY)

## 2024-11-07 ENCOUNTER — Ambulatory Visit: Admitting: Family Medicine

## 2024-11-09 ENCOUNTER — Ambulatory Visit: Admitting: Family Medicine

## 2025-04-02 ENCOUNTER — Ambulatory Visit: Admitting: Family Medicine
